# Patient Record
Sex: Female | Born: 1935 | Race: Black or African American | Hispanic: No | Smoking: Former smoker
Health system: Southern US, Community
[De-identification: ages and names within clinical notes are randomized; demographics above are authoritative.]

## PROBLEM LIST (undated history)

## (undated) DIAGNOSIS — I519 Heart disease, unspecified: Secondary | ICD-10-CM

## (undated) DIAGNOSIS — I10 Essential (primary) hypertension: Secondary | ICD-10-CM

## (undated) DIAGNOSIS — K219 Gastro-esophageal reflux disease without esophagitis: Secondary | ICD-10-CM

## (undated) DIAGNOSIS — F0390 Unspecified dementia without behavioral disturbance: Secondary | ICD-10-CM

## (undated) HISTORY — DX: Heart disease, unspecified: I51.9

## (undated) HISTORY — PX: BREAST LUMPECTOMY: SHX2

---

## 2001-04-22 ENCOUNTER — Encounter: Payer: Self-pay | Admitting: *Deleted

## 2001-04-22 ENCOUNTER — Emergency Department (HOSPITAL_COMMUNITY): Admission: EM | Admit: 2001-04-22 | Discharge: 2001-04-22 | Payer: Self-pay | Admitting: *Deleted

## 2005-04-08 ENCOUNTER — Emergency Department (HOSPITAL_COMMUNITY): Admission: EM | Admit: 2005-04-08 | Discharge: 2005-04-08 | Payer: Self-pay | Admitting: Emergency Medicine

## 2005-05-02 ENCOUNTER — Encounter: Admission: RE | Admit: 2005-05-02 | Discharge: 2005-05-02 | Payer: Self-pay | Admitting: Neurology

## 2005-05-19 ENCOUNTER — Emergency Department (HOSPITAL_COMMUNITY): Admission: EM | Admit: 2005-05-19 | Discharge: 2005-05-19 | Payer: Self-pay | Admitting: Family Medicine

## 2005-07-17 ENCOUNTER — Ambulatory Visit (HOSPITAL_COMMUNITY): Admission: RE | Admit: 2005-07-17 | Discharge: 2005-07-17 | Payer: Self-pay | Admitting: Neurology

## 2008-11-16 ENCOUNTER — Inpatient Hospital Stay (HOSPITAL_COMMUNITY): Admission: RE | Admit: 2008-11-16 | Discharge: 2008-11-19 | Payer: Self-pay | Admitting: Orthopaedic Surgery

## 2008-11-17 ENCOUNTER — Ambulatory Visit: Payer: Self-pay | Admitting: Physical Medicine & Rehabilitation

## 2008-11-18 ENCOUNTER — Encounter (INDEPENDENT_AMBULATORY_CARE_PROVIDER_SITE_OTHER): Payer: Self-pay | Admitting: Orthopaedic Surgery

## 2008-11-18 ENCOUNTER — Ambulatory Visit: Payer: Self-pay | Admitting: Surgery

## 2008-11-19 ENCOUNTER — Inpatient Hospital Stay (HOSPITAL_COMMUNITY)
Admission: RE | Admit: 2008-11-19 | Discharge: 2008-11-27 | Payer: Self-pay | Admitting: Physical Medicine & Rehabilitation

## 2008-11-19 ENCOUNTER — Ambulatory Visit: Payer: Self-pay | Admitting: Physical Medicine & Rehabilitation

## 2008-11-26 ENCOUNTER — Encounter: Payer: Self-pay | Admitting: Physical Medicine & Rehabilitation

## 2008-12-03 ENCOUNTER — Encounter: Admission: RE | Admit: 2008-12-03 | Discharge: 2008-12-03 | Payer: Self-pay | Admitting: Orthopaedic Surgery

## 2009-09-30 ENCOUNTER — Encounter: Admission: RE | Admit: 2009-09-30 | Discharge: 2009-09-30 | Payer: Self-pay | Admitting: Orthopaedic Surgery

## 2010-08-26 IMAGING — CR DG CHEST 2V
2 series · 2 of 2 positions shown · non-contrast
Comparison: None

CLINICAL DATA: Preoperative evaluation for right total knee
arthroplasty.  Nonsmoker.  Hypertension.  No current chest
complaints.

CHEST - 2 VIEW

[view not recorded (1 of 2)]
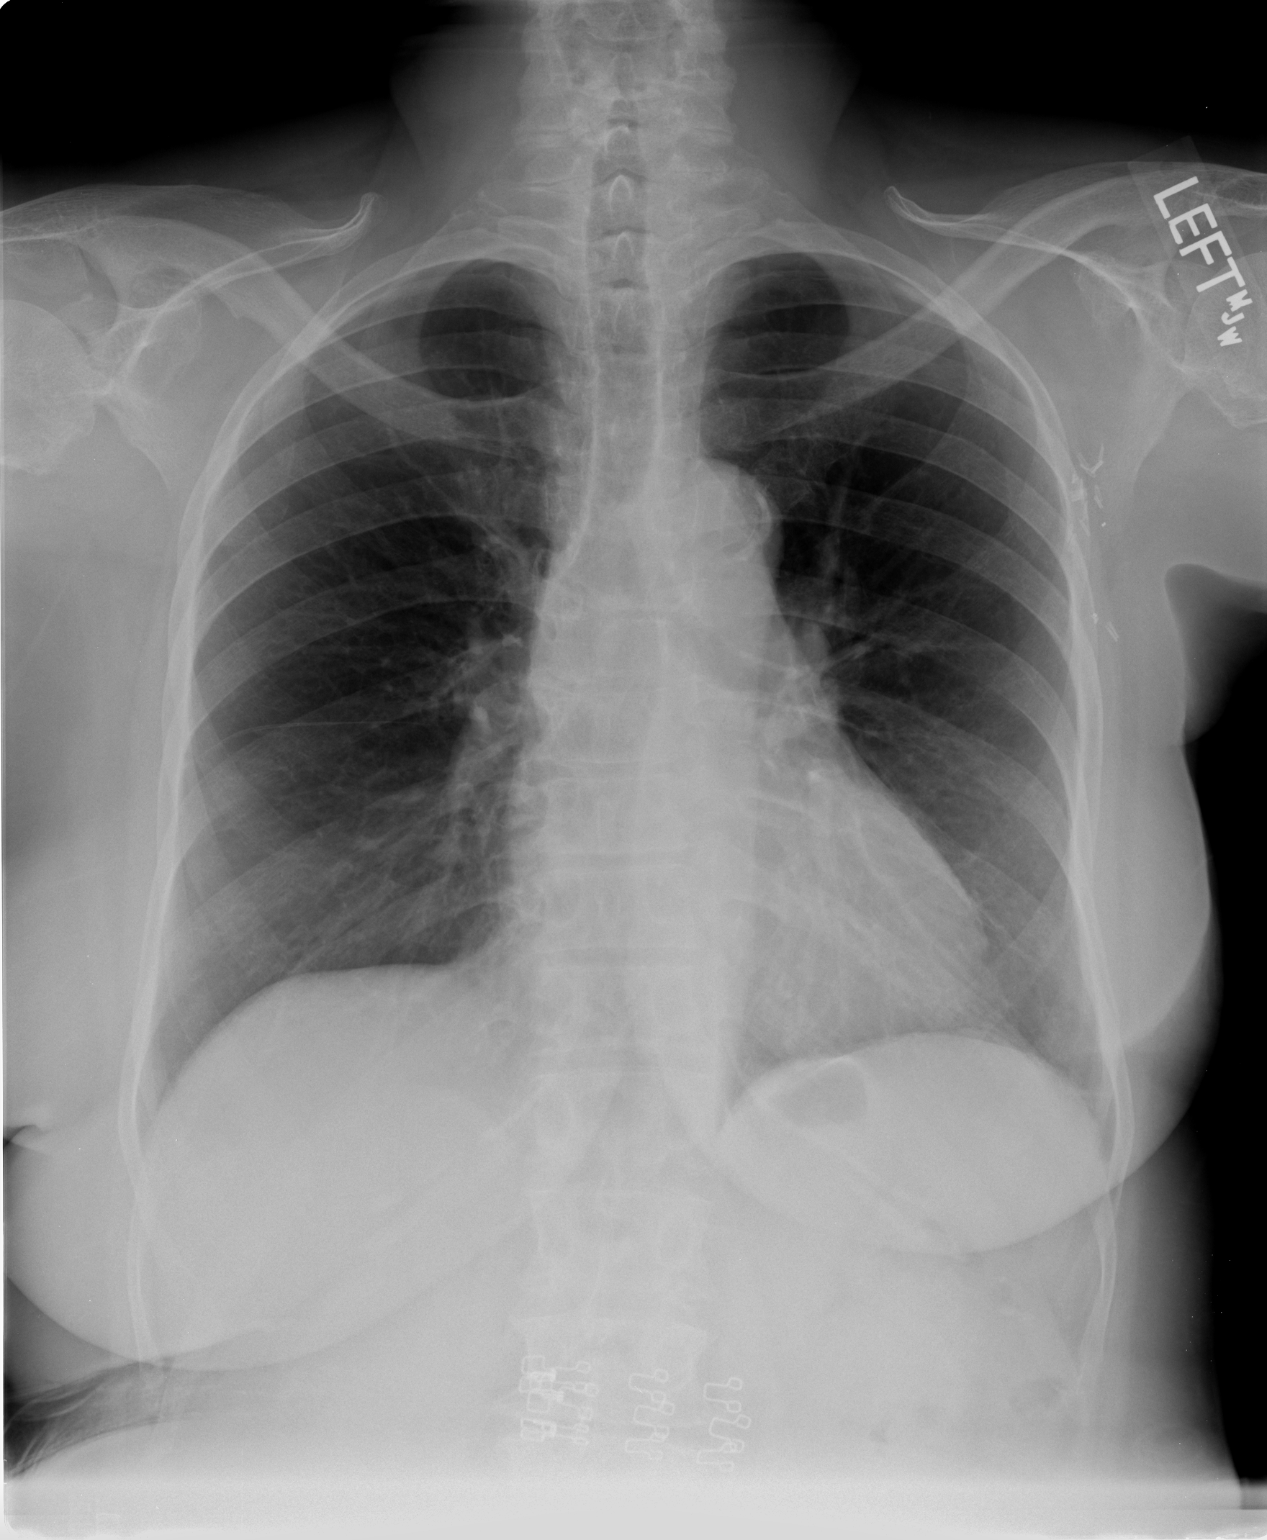

[view not recorded (2 of 2)]
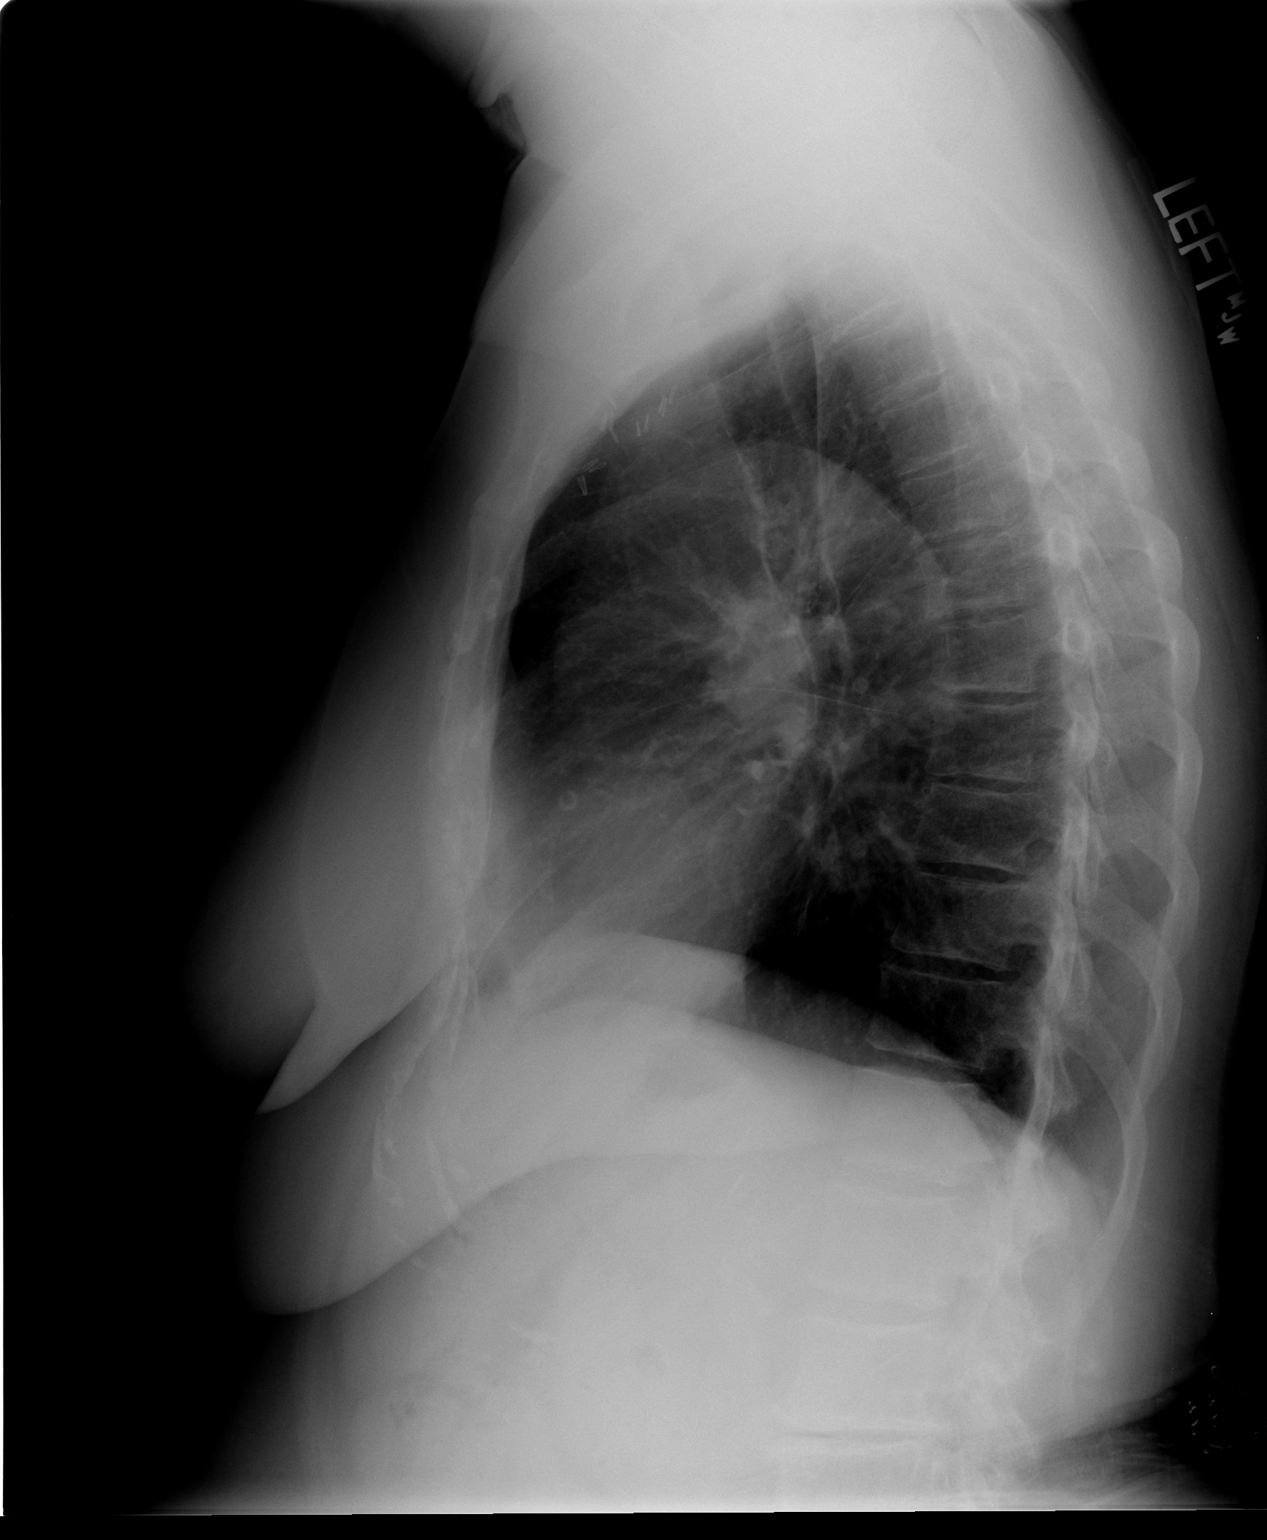

[2 of 2 positions shown; findings below may reference images not displayed]

FINDINGS: Heart and mediastinal contours are within normal limits.
There is calcification identified in the descending portion of the
thoracic aorta.  The lung fields appear clear with no focal
infiltrates or signs of congestive failure.  Surgical clips are
identified in the left axillary region and there is some
irregularity of the left breast contour raising the possibility of
prior lumpectomy with associated node dissection.  Clinical
correlation is recommended.

Bony structures demonstrate some degenerative disc space narrowing
of the upper lumbar spine and are otherwise intact.
IMPRESSION: No acute cardiopulmonary disease noted.

## 2010-11-07 LAB — PROTIME-INR
INR: 1.7 — ABNORMAL HIGH (ref 0.00–1.49)
Prothrombin Time: 21.2 seconds — ABNORMAL HIGH (ref 11.6–15.2)

## 2010-11-07 LAB — GLUCOSE, CAPILLARY
Glucose-Capillary: 122 mg/dL — ABNORMAL HIGH (ref 70–99)
Glucose-Capillary: 89 mg/dL (ref 70–99)

## 2010-11-08 LAB — COMPREHENSIVE METABOLIC PANEL
ALT: 12 U/L (ref 0–35)
ALT: 13 U/L (ref 0–35)
ALT: 23 U/L (ref 0–35)
AST: 17 U/L (ref 0–37)
AST: 34 U/L (ref 0–37)
AST: 30 U/L (ref 0–37)
Albumin: 2.4 g/dL — ABNORMAL LOW (ref 3.5–5.2)
Albumin: 2.6 g/dL — ABNORMAL LOW (ref 3.5–5.2)
Albumin: 3.6 g/dL (ref 3.5–5.2)
Alkaline Phosphatase: 105 U/L (ref 39–117)
Alkaline Phosphatase: 145 U/L — ABNORMAL HIGH (ref 39–117)
Alkaline Phosphatase: 218 U/L — ABNORMAL HIGH (ref 39–117)
BUN: 10 mg/dL (ref 6–23)
BUN: 8 mg/dL (ref 6–23)
BUN: 16 mg/dL (ref 6–23)
CO2: 27 mEq/L (ref 19–32)
CO2: 29 mEq/L (ref 19–32)
CO2: 31 mEq/L (ref 19–32)
Calcium: 8.6 mg/dL (ref 8.4–10.5)
Calcium: 8.7 mg/dL (ref 8.4–10.5)
Calcium: 10 mg/dL (ref 8.4–10.5)
Chloride: 102 mEq/L (ref 96–112)
Chloride: 95 mEq/L — ABNORMAL LOW (ref 96–112)
Chloride: 98 mEq/L (ref 96–112)
Creatinine, Ser: 0.79 mg/dL (ref 0.4–1.2)
Creatinine, Ser: 1.09 mg/dL (ref 0.4–1.2)
Creatinine, Ser: 1.07 mg/dL (ref 0.4–1.2)
GFR calc Af Amer: 59 mL/min — ABNORMAL LOW (ref >60)
GFR calc Af Amer: >60 mL/min (ref >60)
GFR calc Af Amer: >60 mL/min (ref >60)
GFR calc non Af Amer: 49 mL/min — ABNORMAL LOW (ref >60)
GFR calc non Af Amer: >60 mL/min (ref >60)
GFR calc non Af Amer: 50 mL/min — ABNORMAL LOW (ref >60)
Glucose, Bld: 131 mg/dL — ABNORMAL HIGH (ref 70–99)
Glucose, Bld: 168 mg/dL — ABNORMAL HIGH (ref 70–99)
Glucose, Bld: 142 mg/dL — ABNORMAL HIGH (ref 70–99)
Potassium: 3.6 mEq/L (ref 3.5–5.1)
Potassium: 4.2 mEq/L (ref 3.5–5.1)
Potassium: 4.3 mEq/L (ref 3.5–5.1)
Sodium: 130 mEq/L — ABNORMAL LOW (ref 135–145)
Sodium: 137 mEq/L (ref 135–145)
Sodium: 138 mEq/L (ref 135–145)
Total Bilirubin: 0.6 mg/dL (ref 0.3–1.2)
Total Bilirubin: 0.8 mg/dL (ref 0.3–1.2)
Total Bilirubin: 0.7 mg/dL (ref 0.3–1.2)
Total Protein: 6 g/dL (ref 6.0–8.3)
Total Protein: 6.2 g/dL (ref 6.0–8.3)
Total Protein: 7.2 g/dL (ref 6.0–8.3)

## 2010-11-08 LAB — GLUCOSE, CAPILLARY
Glucose-Capillary: 100 mg/dL — ABNORMAL HIGH (ref 70–99)
Glucose-Capillary: 101 mg/dL — ABNORMAL HIGH (ref 70–99)
Glucose-Capillary: 114 mg/dL — ABNORMAL HIGH (ref 70–99)
Glucose-Capillary: 117 mg/dL — ABNORMAL HIGH (ref 70–99)
Glucose-Capillary: 117 mg/dL — ABNORMAL HIGH (ref 70–99)
Glucose-Capillary: 119 mg/dL — ABNORMAL HIGH (ref 70–99)
Glucose-Capillary: 120 mg/dL — ABNORMAL HIGH (ref 70–99)
Glucose-Capillary: 123 mg/dL — ABNORMAL HIGH (ref 70–99)
Glucose-Capillary: 123 mg/dL — ABNORMAL HIGH (ref 70–99)
Glucose-Capillary: 125 mg/dL — ABNORMAL HIGH (ref 70–99)
Glucose-Capillary: 125 mg/dL — ABNORMAL HIGH (ref 70–99)
Glucose-Capillary: 128 mg/dL — ABNORMAL HIGH (ref 70–99)
Glucose-Capillary: 129 mg/dL — ABNORMAL HIGH (ref 70–99)
Glucose-Capillary: 129 mg/dL — ABNORMAL HIGH (ref 70–99)
Glucose-Capillary: 131 mg/dL — ABNORMAL HIGH (ref 70–99)
Glucose-Capillary: 133 mg/dL — ABNORMAL HIGH (ref 70–99)
Glucose-Capillary: 133 mg/dL — ABNORMAL HIGH (ref 70–99)
Glucose-Capillary: 134 mg/dL — ABNORMAL HIGH (ref 70–99)
Glucose-Capillary: 136 mg/dL — ABNORMAL HIGH (ref 70–99)
Glucose-Capillary: 136 mg/dL — ABNORMAL HIGH (ref 70–99)
Glucose-Capillary: 139 mg/dL — ABNORMAL HIGH (ref 70–99)
Glucose-Capillary: 139 mg/dL — ABNORMAL HIGH (ref 70–99)
Glucose-Capillary: 140 mg/dL — ABNORMAL HIGH (ref 70–99)
Glucose-Capillary: 141 mg/dL — ABNORMAL HIGH (ref 70–99)
Glucose-Capillary: 144 mg/dL — ABNORMAL HIGH (ref 70–99)
Glucose-Capillary: 145 mg/dL — ABNORMAL HIGH (ref 70–99)
Glucose-Capillary: 149 mg/dL — ABNORMAL HIGH (ref 70–99)
Glucose-Capillary: 150 mg/dL — ABNORMAL HIGH (ref 70–99)
Glucose-Capillary: 154 mg/dL — ABNORMAL HIGH (ref 70–99)
Glucose-Capillary: 155 mg/dL — ABNORMAL HIGH (ref 70–99)
Glucose-Capillary: 157 mg/dL — ABNORMAL HIGH (ref 70–99)
Glucose-Capillary: 158 mg/dL — ABNORMAL HIGH (ref 70–99)
Glucose-Capillary: 158 mg/dL — ABNORMAL HIGH (ref 70–99)
Glucose-Capillary: 164 mg/dL — ABNORMAL HIGH (ref 70–99)
Glucose-Capillary: 167 mg/dL — ABNORMAL HIGH (ref 70–99)
Glucose-Capillary: 167 mg/dL — ABNORMAL HIGH (ref 70–99)
Glucose-Capillary: 179 mg/dL — ABNORMAL HIGH (ref 70–99)
Glucose-Capillary: 187 mg/dL — ABNORMAL HIGH (ref 70–99)
Glucose-Capillary: 75 mg/dL (ref 70–99)
Glucose-Capillary: 84 mg/dL (ref 70–99)
Glucose-Capillary: 86 mg/dL (ref 70–99)
Glucose-Capillary: 94 mg/dL (ref 70–99)
Glucose-Capillary: 95 mg/dL (ref 70–99)
Glucose-Capillary: 95 mg/dL (ref 70–99)

## 2010-11-08 LAB — CBC
HCT: 25.9 % — ABNORMAL LOW (ref 36.0–46.0)
HCT: 26.2 % — ABNORMAL LOW (ref 36.0–46.0)
HCT: 26.9 % — ABNORMAL LOW (ref 36.0–46.0)
HCT: 28.1 % — ABNORMAL LOW (ref 36.0–46.0)
HCT: 28.4 % — ABNORMAL LOW (ref 36.0–46.0)
HCT: 32.2 % — ABNORMAL LOW (ref 36.0–46.0)
HCT: 39.4 % (ref 36.0–46.0)
Hemoglobin: 10.8 g/dL — ABNORMAL LOW (ref 12.0–15.0)
Hemoglobin: 8.9 g/dL — ABNORMAL LOW (ref 12.0–15.0)
Hemoglobin: 9.1 g/dL — ABNORMAL LOW (ref 12.0–15.0)
Hemoglobin: 9.2 g/dL — ABNORMAL LOW (ref 12.0–15.0)
Hemoglobin: 9.5 g/dL — ABNORMAL LOW (ref 12.0–15.0)
Hemoglobin: 9.5 g/dL — ABNORMAL LOW (ref 12.0–15.0)
Hemoglobin: 13.7 g/dL (ref 12.0–15.0)
MCHC: 33.4 g/dL (ref 30.0–36.0)
MCHC: 33.6 g/dL (ref 30.0–36.0)
MCHC: 33.8 g/dL (ref 30.0–36.0)
MCHC: 34 g/dL (ref 30.0–36.0)
MCHC: 34.2 g/dL (ref 30.0–36.0)
MCHC: 35.1 g/dL (ref 30.0–36.0)
MCHC: 34.9 g/dL (ref 30.0–36.0)
MCV: 87.1 fL (ref 78.0–100.0)
MCV: 88.6 fL (ref 78.0–100.0)
MCV: 88.6 fL (ref 78.0–100.0)
MCV: 88.7 fL (ref 78.0–100.0)
MCV: 88.9 fL (ref 78.0–100.0)
MCV: 89 fL (ref 78.0–100.0)
MCV: 87.3 fL (ref 78.0–100.0)
Platelets: 228 10*3/uL (ref 150–400)
Platelets: 241 10*3/uL (ref 150–400)
Platelets: 270 10*3/uL (ref 150–400)
Platelets: 324 10*3/uL (ref 150–400)
Platelets: 351 10*3/uL (ref 150–400)
Platelets: 376 10*3/uL (ref 150–400)
Platelets: 258 10*3/uL (ref 150–400)
RBC: 2.96 MIL/uL — ABNORMAL LOW (ref 3.87–5.11)
RBC: 2.97 MIL/uL — ABNORMAL LOW (ref 3.87–5.11)
RBC: 3.02 MIL/uL — ABNORMAL LOW (ref 3.87–5.11)
RBC: 3.16 MIL/uL — ABNORMAL LOW (ref 3.87–5.11)
RBC: 3.2 MIL/uL — ABNORMAL LOW (ref 3.87–5.11)
RBC: 3.63 MIL/uL — ABNORMAL LOW (ref 3.87–5.11)
RBC: 4.51 MIL/uL (ref 3.87–5.11)
RDW: 14.2 % (ref 11.5–15.5)
RDW: 14.3 % (ref 11.5–15.5)
RDW: 14.5 % (ref 11.5–15.5)
RDW: 14.8 % (ref 11.5–15.5)
RDW: 14.8 % (ref 11.5–15.5)
RDW: 14.9 % (ref 11.5–15.5)
RDW: 14.8 % (ref 11.5–15.5)
WBC: 10.1 10*3/uL (ref 4.0–10.5)
WBC: 11.7 10*3/uL — ABNORMAL HIGH (ref 4.0–10.5)
WBC: 12.2 10*3/uL — ABNORMAL HIGH (ref 4.0–10.5)
WBC: 8.1 10*3/uL (ref 4.0–10.5)
WBC: 8.4 10*3/uL (ref 4.0–10.5)
WBC: 8.5 10*3/uL (ref 4.0–10.5)
WBC: 6.3 10*3/uL (ref 4.0–10.5)

## 2010-11-08 LAB — BASIC METABOLIC PANEL
BUN: 10 mg/dL (ref 6–23)
BUN: 13 mg/dL (ref 6–23)
BUN: 17 mg/dL (ref 6–23)
BUN: 8 mg/dL (ref 6–23)
CO2: 26 mEq/L (ref 19–32)
CO2: 28 mEq/L (ref 19–32)
CO2: 28 mEq/L (ref 19–32)
CO2: 29 mEq/L (ref 19–32)
Calcium: 8.6 mg/dL (ref 8.4–10.5)
Calcium: 8.7 mg/dL (ref 8.4–10.5)
Calcium: 8.9 mg/dL (ref 8.4–10.5)
Calcium: 8.9 mg/dL (ref 8.4–10.5)
Chloride: 100 mEq/L (ref 96–112)
Chloride: 96 mEq/L (ref 96–112)
Chloride: 97 mEq/L (ref 96–112)
Chloride: 99 mEq/L (ref 96–112)
Creatinine, Ser: 0.83 mg/dL (ref 0.4–1.2)
Creatinine, Ser: 0.98 mg/dL (ref 0.4–1.2)
Creatinine, Ser: 1.77 mg/dL — ABNORMAL HIGH (ref 0.4–1.2)
Creatinine, Ser: 1.98 mg/dL — ABNORMAL HIGH (ref 0.4–1.2)
GFR calc Af Amer: 30 mL/min — ABNORMAL LOW (ref >60)
GFR calc Af Amer: 34 mL/min — ABNORMAL LOW (ref >60)
GFR calc Af Amer: >60 mL/min (ref >60)
GFR calc Af Amer: >60 mL/min (ref >60)
GFR calc non Af Amer: 24 mL/min — ABNORMAL LOW (ref >60)
GFR calc non Af Amer: 28 mL/min — ABNORMAL LOW (ref >60)
GFR calc non Af Amer: 55 mL/min — ABNORMAL LOW (ref >60)
GFR calc non Af Amer: >60 mL/min (ref >60)
Glucose, Bld: 117 mg/dL — ABNORMAL HIGH (ref 70–99)
Glucose, Bld: 151 mg/dL — ABNORMAL HIGH (ref 70–99)
Glucose, Bld: 152 mg/dL — ABNORMAL HIGH (ref 70–99)
Glucose, Bld: 193 mg/dL — ABNORMAL HIGH (ref 70–99)
Potassium: 3.7 mEq/L (ref 3.5–5.1)
Potassium: 3.9 mEq/L (ref 3.5–5.1)
Potassium: 4.1 mEq/L (ref 3.5–5.1)
Potassium: 4.8 mEq/L (ref 3.5–5.1)
Sodium: 133 mEq/L — ABNORMAL LOW (ref 135–145)
Sodium: 134 mEq/L — ABNORMAL LOW (ref 135–145)
Sodium: 134 mEq/L — ABNORMAL LOW (ref 135–145)
Sodium: 134 mEq/L — ABNORMAL LOW (ref 135–145)

## 2010-11-08 LAB — URINALYSIS, ROUTINE W REFLEX MICROSCOPIC
Bilirubin Urine: NEGATIVE
Bilirubin Urine: NEGATIVE
Bilirubin Urine: NEGATIVE
Glucose, UA: NEGATIVE mg/dL
Glucose, UA: NEGATIVE mg/dL
Glucose, UA: NEGATIVE mg/dL
Hgb urine dipstick: NEGATIVE
Hgb urine dipstick: NEGATIVE
Ketones, ur: 15 mg/dL — AB
Ketones, ur: NEGATIVE mg/dL
Ketones, ur: 15 mg/dL — AB
Nitrite: NEGATIVE
Nitrite: NEGATIVE
Nitrite: POSITIVE — AB
Protein, ur: NEGATIVE mg/dL
Protein, ur: NEGATIVE mg/dL
Protein, ur: 30 mg/dL — AB
Specific Gravity, Urine: 1.005 (ref 1.005–1.030)
Specific Gravity, Urine: 1.023 (ref 1.005–1.030)
Specific Gravity, Urine: 1.026 (ref 1.005–1.030)
Urobilinogen, UA: 0.2 mg/dL (ref 0.0–1.0)
Urobilinogen, UA: 0.2 mg/dL (ref 0.0–1.0)
Urobilinogen, UA: 0.2 mg/dL (ref 0.0–1.0)
pH: 6 (ref 5.0–8.0)
pH: 6 (ref 5.0–8.0)
pH: 5.5 (ref 5.0–8.0)

## 2010-11-08 LAB — CROSSMATCH
ABO/RH(D): O POS
Antibody Screen: NEGATIVE

## 2010-11-08 LAB — URINE CULTURE
Colony Count: NO GROWTH
Colony Count: >=100000
Culture: NO GROWTH
Special Requests: NEGATIVE

## 2010-11-08 LAB — DIFFERENTIAL
Basophils Absolute: 0 10*3/uL (ref 0.0–0.1)
Basophils Absolute: 0 10*3/uL (ref 0.0–0.1)
Basophils Relative: 0 % (ref 0–1)
Basophils Relative: 0 % (ref 0–1)
Eosinophils Absolute: 0.1 10*3/uL (ref 0.0–0.7)
Eosinophils Absolute: 0.2 10*3/uL (ref 0.0–0.7)
Eosinophils Relative: 2 % (ref 0–5)
Eosinophils Relative: 3 % (ref 0–5)
Lymphocytes Relative: 12 % (ref 12–46)
Lymphocytes Relative: 22 % (ref 12–46)
Lymphs Abs: 1 10*3/uL (ref 0.7–4.0)
Lymphs Abs: 1.4 10*3/uL (ref 0.7–4.0)
Monocytes Absolute: 0.9 10*3/uL (ref 0.1–1.0)
Monocytes Absolute: 0.5 10*3/uL (ref 0.1–1.0)
Monocytes Relative: 10 % (ref 3–12)
Monocytes Relative: 8 % (ref 3–12)
Neutro Abs: 6.5 10*3/uL (ref 1.7–7.7)
Neutro Abs: 4.2 10*3/uL (ref 1.7–7.7)
Neutrophils Relative %: 77 % (ref 43–77)
Neutrophils Relative %: 67 % (ref 43–77)

## 2010-11-08 LAB — PROTIME-INR
INR: 1.1 (ref 0.00–1.49)
INR: 1.9 — ABNORMAL HIGH (ref 0.00–1.49)
INR: 2.1 — ABNORMAL HIGH (ref 0.00–1.49)
INR: 2.2 — ABNORMAL HIGH (ref 0.00–1.49)
INR: 2.5 — ABNORMAL HIGH (ref 0.00–1.49)
INR: 2.6 — ABNORMAL HIGH (ref 0.00–1.49)
INR: 2.7 — ABNORMAL HIGH (ref 0.00–1.49)
INR: 3 — ABNORMAL HIGH (ref 0.00–1.49)
INR: 3.1 — ABNORMAL HIGH (ref 0.00–1.49)
INR: 0.9 (ref 0.00–1.49)
Prothrombin Time: 14.8 seconds (ref 11.6–15.2)
Prothrombin Time: 22.6 seconds — ABNORMAL HIGH (ref 11.6–15.2)
Prothrombin Time: 24.8 seconds — ABNORMAL HIGH (ref 11.6–15.2)
Prothrombin Time: 26 seconds — ABNORMAL HIGH (ref 11.6–15.2)
Prothrombin Time: 29 seconds — ABNORMAL HIGH (ref 11.6–15.2)
Prothrombin Time: 29.8 seconds — ABNORMAL HIGH (ref 11.6–15.2)
Prothrombin Time: 30.6 seconds — ABNORMAL HIGH (ref 11.6–15.2)
Prothrombin Time: 33.7 seconds — ABNORMAL HIGH (ref 11.6–15.2)
Prothrombin Time: 34.2 seconds — ABNORMAL HIGH (ref 11.6–15.2)
Prothrombin Time: 12.7 seconds (ref 11.6–15.2)

## 2010-11-08 LAB — SODIUM, URINE, RANDOM: Sodium, Ur: 22 mEq/L

## 2010-11-08 LAB — URINE MICROSCOPIC-ADD ON

## 2010-11-08 LAB — TYPE AND SCREEN
ABO/RH(D): O POS
Antibody Screen: NEGATIVE

## 2010-11-08 LAB — FOLATE: Folate: 8.1 ng/mL

## 2010-11-08 LAB — VITAMIN B12: Vitamin B-12: 506 pg/mL (ref 211–911)

## 2010-11-08 LAB — OSMOLALITY: Osmolality: 274 mOsm/kg — ABNORMAL LOW (ref 275–300)

## 2010-11-08 LAB — URIC ACID: Uric Acid, Serum: 3.2 mg/dL (ref 2.4–7.0)

## 2010-11-08 LAB — APTT: aPTT: 35 seconds (ref 24–37)

## 2010-11-08 LAB — TSH: TSH: 0.381 u[IU]/mL (ref 0.350–4.500)

## 2010-11-08 LAB — HEMOGLOBIN A1C
Hgb A1c MFr Bld: 6.5 % — ABNORMAL HIGH (ref 4.6–6.1)
Mean Plasma Glucose: 140 mg/dL

## 2010-11-08 LAB — ABO/RH: ABO/RH(D): O POS

## 2010-11-08 LAB — RPR: RPR Ser Ql: NONREACTIVE

## 2010-11-08 LAB — ALDOSTERONE: Aldosterone, Serum: 1 ng/dL

## 2010-12-12 NOTE — Op Note (Signed)
[name], [name]                    ACCOUNT NO.:  [id number]   MEDICAL RECORD NO.:  [id number]          PATIENT TYPE:  INP   LOCATION:  NA                           FACILITY:  [facility]   PHYSICIAN:  Vonna Kotyk. Whitfield, M.D.DATE OF BIRTH:  [birth year]   DATE OF PROCEDURE:  11/16/2008  DATE OF DISCHARGE:                               OPERATIVE REPORT   PREOPERATIVE DIAGNOSIS:  End-stage osteoarthritis, right knee.   POSTOPERATIVE DIAGNOSIS:  End-stage osteoarthritis, right knee.   PROCEDURE:  Right total knee replacement.   SURGEON:  Vonna Kotyk. Durward Fortes, MD   ASSISTANT:  Mike Craze. Petrarca, PA-C   ANESTHESIA:  General with supplemental femoral nerve block.   COMPLICATIONS:  None.   COMPONENTS:  DePuy LCS standard femoral component, a #3 keeled tibial  rotating tray with 10 mm bridging bearing, a 3 peg metal back rotating  patella.  All metallic components were secured with polymethyl  methacrylate.   PROCEDURE IN DETAIL:  With the patient comfortable on the operating  table, she was placed under general orotracheal anesthesia.  She did  receive a preoperative femoral nerve block.  Nursing staff inserted a  Foley catheter.  Urine was clear.   Tourniquet was then applied to the right lower extremity.  The right leg  was then prepped with Betadine scrub and then DuraPrep with the  tourniquet to the midfoot.  Sterile draping was performed.  With the  extremity still elevated, it was Esmarch exsanguinated with a proximal  tourniquet at 350 mmHg.   A midline longitudinal incision was made, centered about the patella,  extending from the superior pouch to the tibial tubercle via sharp  dissection.  Incision was carried down to subcutaneous tissue.  The  first layer of capsule was incised in the midline.  A medial  parapatellar incision was then made with a Bovie through the deep  capsule.  The joint was entered with clear yellow joint effusion.   The patella was everted to 180 degrees.   The knee flexed to 90 degrees.  There were moderate-sized osteophytes particularly on the medial  compartment, smaller osteophytes laterally and about the patella.  There  was complete absence of articular cartilage on the medial femoral  condyle.  There was mild varus position that was easily correctable to  neutral.  There was also very mild reddened synovitis.  A synovectomy  was performed.   We confirmed a standard femoral component and a #3 tibial tray.   First bony cut was made transversely on the proximal tibia with a 7  degree posterior declination.  At every stage, we checked with the  alignment guide and felt we are in perfect position.  The alignment  guide was then applied and the osteotomy made transversely across the  proximal tibia.   Subsequent cut was then made along the anterior and posterior femur.  We  used a 4 degree distal femoral valgus cut.  The 10 mm flexion/extension  gaps were symmetrical and again at each stage, we checked the alignment  guide.  The final cut was then  made on the tibia to taper the femur and  to insert the two center holes in the femoral condyles.  There were no  osteophytes behind the femoral condyles.  Lamina spreader was inserted  to both medial and lateral compartments.  Medial and lateral menisci,  ACL, and PCL were excised.  There was a Baker cyst that was partially  excised along medially and posteriorly.   Retractor was then placed around the tibia.  We trialed a #3 tibial  tray.  This was then applied with 2 pins.  The center hole was then  made.  The keeled cut was then applied.  The retention pins were then  removed.  A 10 mm bridging bearing was inserted followed by the trial  femoral component.  We had full extension.  No opening with varus or  valgus stress and had very nice flexion without malrotation of any of  the components.   Patella was then prepared by removing 10 mm of bone, leaving 13 mm of  patella thickness.   Three holes were then made, the trial patella  applied, and through a full range of motion it was perfectly stable.   Trial components were removed.  The joint was copiously irrigated with  jet saline.  The final metallic components were then inserted with  polymethyl methacrylate.  Extraneous methacrylate was removed from their  periphery.  The 10-mm bridging bearing again gave [place] perfect stability.   The knee was held in extension and any further extraneous methacrylate  was removed with a Valora Corporal.  After complete maturation of methacrylate,  the joint was then __________ of loose methacrylate.  Joint was again  irrigated with saline solution.  We then injected Marcaine with  epinephrine into the deep capsule.  Tourniquet was deflated.  There was  immediate capillary refill to the cut surfaces.  We did apply FloSeal.  We had a very nice dry feel.  A Hemovac was not necessary.  We lightly  irrigated the joint.   The deep capsule was closed with interrupted #1 Ethibond.  Superficial  capsule was closed with running 0 Vicryl, subcu with 2-0 Vicryl, skin  closed with skin clips.  Sterile bulky dressing was applied followed by  the patient's support stocking.   The patient tolerated the procedure well without complications.      Vonna Kotyk. Durward Fortes, M.D.  Electronically Signed     PWW/MEDQ  D:  11/16/2008  T:  11/17/2008  Job:  [id number]

## 2010-12-12 NOTE — H&P (Signed)
NAMEARNOLA, [name] NO.:  [id number]   MEDICAL RECORD NO.:  [id number]          PATIENT TYPE:  IPS   LOCATION:  [id number]                         FACILITY:  [facility]   PHYSICIAN:  Meredith Staggers, M.D.DATE OF BIRTH:  1929-07-16   DATE OF ADMISSION:  11/19/2008  DATE OF DISCHARGE:                              HISTORY & PHYSICAL   PRIMARY CARE PHYSICIAN:  Keane Police, MD, [place], [place].   CARDIOLOGIST:  Cleora Fleet, MD, [place], [place].   ORTHOPEDIST:  Vonna Kotyk. Durward Fortes, MD   CHIEF COMPLAINT:  Right knee pain.   HISTORY OF PRESENT ILLNESS:  This is a 77 year old African American  female with breast cancer, diabetes, and seizure disorder who was  admitted on November 16, 2008 with end-stage changes in the right knee.  She failed conservative measures and underwent right total knee  replacement on November 16, 2008 by Dr. Durward Fortes.  She is 50%  weightbearing and on Coumadin for DVT prophylaxis.  Postoperatively, she  had increased of her creatinine to 1.98.  [facility] was consulted and  medications were held and the patient was placed on gentle IV fluids.  Creatinine has since improved to 0.83 as of today.  Sodium is decreased  to 130 and the patient was placed on fluids today.  The patient has had  some mild confusion which has since improved after adjusting narcotics.  The patient is still needing some cuing for maintenance of weightbearing  precautions.  She is placed on Cipro on November 17, 2008 for Klebsiella  UTI x5 days.  Sugars have been monitored as well and Lantus insulin was  added along with Starlix.  PMNR was consulted yesterday and after review  felt the patient could benefit from an inpatient rehab admission, thus  she was brought today.   REVIEW OF SYSTEMS:  Notable for constipation, joint swelling, history of  remote seizures, and reflux.  Appetite has been improving.  Other  pertinent positives are as above and full review is in the  written  history and physical.   PAST MEDICAL HISTORY:  1. Hypertension.  2. Diabetes.  3. Gout.  4. Asthma.  5. Seizure disorder.  6. Left breast cancer status post chemo.  7. GERD.  8. Cholecystectomy.  9. Hysterectomy.  10.History is negative for alcohol or tobacco.   FAMILY HISTORY:  Positive for CAD and diabetes.   SOCIAL HISTORY:  The patient lives alone in [place], [place].  She  plans to stay with the daughter in [place] on discharge.  Daughter  works for plans to adjust schedule as needed to assist at home.  She has  a three-level home with bedroom upstairs.   ALLERGIES:  ACE INHIBITORS and ULTRAM.   MEDICATIONS:  Metoprolol, Diovan, aspirin, KCl, Starlix, Aciphex,  allopurinol, Os-Cal, Singulair, albuterol, Norvasc, Keppra, and Flonase.   LABORATORY DATA:  Hemoglobin 9.1, platelets 241, and white count 10.  Sodium 133, potassium 3.9, BUN 8, and creatinine 0.8.  INR 2.7.  Uric  acid 3.2.   PHYSICAL EXAMINATION:  VITAL SIGNS:  Blood pressure is 146/74, pulse is  83, respiratory rate 18, and temperature 98.2.  GENERAL:  The patient is pleasant, alert, and oriented x3.  Pupils  equal, round, and reactive to light.  EARS, NOSE, AND THROAT:  Generally unremarkable with fair dentition and  pink moist mucosa.  NECK:  Supple without JVD or lymphadenopathy.  CHEST:  Clear to auscultation bilaterally without wheezes, rales, or  rhonchi.  HEART:  Regular rate and rhythm without murmurs, rubs, or gallops.  EXTREMITIES:  No clubbing, cyanosis, or edema.  ABDOMEN:  Soft and nontender.  Bowel sounds are positive.  SKIN:  Notable for right knee incision which was covered with Mepilex  and clean, dry, and intact.  There is a mild hematoma midway down the  incision.  The area is appropriately tender.  NEUROLOGIC:  Cranial nerves II-XII are intact.  Reflexes are 1+ in lower  and 2+ in upper extremities.  Sensation is grossly intact throughout.  Judgment, orientation,  memory, and mood are all appropriate.   POST ADMISSION PHYSICIAN EVALUATION:  1. Functional deficit secondary to OA of the right knee status post      right total knee replacement postoperative day #3.  2. The patient is admitted to receive collaborative and      interdisciplinary care between the physiatrist, rehab nursing      staff, and therapy team.  3. The patient's level of medical complexity and substantial therapy      needs in context of that medical necessity cannot be provided at a      lesser intensity of care.  4. The patient has experienced substantial functional loss from her      baseline.  Premorbidly, she is independent with a cane but not      driving.  Currently within the last 24 hours, the patient is total      assist with gait 2-3 feet with difficulty observing weightbearing.      She is mod assist sit to stand transfers (70%).  She is total      assist to stand to sit, transfers 50% assistance from bed mobility,      and mod assist for bathing and dressing upper and lower body.      Based on the patient's diagnosis, physical exam, and functional      history, she has potential functional progress which will result in      measurable gains while in inpatient rehab.  These gains will be of      substantial and practical use upon discharge to home in      facilitating mobility and self-care.  Interim changes since our      preadmission screening document are detailed in history of present      illness above.  5. Physiatrist will provide 24-hour management of medical needs as      well as oversight of the therapy plans/treatment and provide      guidance as appropriate regarding interaction of two.  Medical      problem list and plan are listed below.  6. A 24-hour rehab nursing will assist in management of the patient's      bowel and bladder needs as well as safety awareness.  We will      initiate a bed alarm today.  Also will follow up for skin care      needs,  pain management, mental status, nutritional needs, and      integration of therapy concepts and techniques.  7. PT will assess and treat  for lower extremity strength, knee range      of motion, stability, adaptive equipment, functional mobility, and      gait with goals at supervision to modified independent.  8. OT will assess and treat for upper extremity use ADLs, safety      awareness, adaptive techniques and equipment, and family education      with goals at a supervision to modified independent level.  9. Case Management and social worker will assess and treat for      psychosocial issues and discharge planning.  10.Team conferences will be held weekly to assess progress towards      goals and to determine barriers to discharge.  11.The patient has demonstrated sufficient medical stability and      exercise capacity to tolerate at least 3 hours of therapy per day      at least 5 days per week.  12.Estimated length of stay is approximately 7-10 days.  Prognosis is      good.   MEDICAL PROBLEM LIST AND PLAN:  1. Deep venous thrombosis prophylaxis with Coumadin with goal INR 2-3.      Watch CBC and for signs and symptoms of bleeding.  Check CBC      Monday.  2. Postoperative acute renal insufficiency:  Medications have been      held including hydrochlorothiazide, Benicar, and allopurinol.      Creatinine has normalized.  We will follow electrolytes and adjust      appropriately going forward.  The patient's n.p.o. intake is      improving as well.  3. Hyponatremia:  We will maintain 1800 mL fluid restriction for now.      Lot of this hyponatremia is likely located at the patient's      diuretic use and we probably can relax this.  4. Klebsiella urinary tract infection:  Cipro for 5 days ending November 21, 2008.  5. Hypertension:  We will continue Norvasc, Lopressor, and hydralazine      for now t.i.d.  6. Diabetes:  Starlix 120 mg t.i.d.  The patient also has had Lantus       added.  We will follow CBGs at meals and at bedtime.  Observe with      dietary intake and exercise how these affect her sugars going      forward.  Likely sugars will go down with treatment of urinary      tract infection.  7. Gout:  Uric acid is normal.  We will watch for signs and symptoms.      She had no active gout on examination today.  8. Postoperative anemia:  She is going to be transfused 1 unit of      packed red blood cells today.  We will follow CBC serially.  There      were no overt signs of bleeding on exam today.      Meredith Staggers, M.D.  Electronically Signed     ZTS/MEDQ  D:  11/19/2008  T:  11/20/2008  Job:  [id number]

## 2010-12-12 NOTE — Consult Note (Signed)
[name], BUEHRER NO.:  [id number]   MEDICAL RECORD NO.:  [id number]          PATIENT TYPE:  INP   LOCATION:  [id number]                         FACILITY:  [facility]   PHYSICIAN:  Domingo Mend, M.D. DATE OF BIRTH:  09/06/1928   DATE OF CONSULTATION:  11/17/2008  DATE OF DISCHARGE:                                 CONSULTATION   REQUESTING M.D.:  Dr. Joni Fears of orthopedics.   REASON FOR CONSULTATION:  Increasing BUN and creatinine.   HISTORY OF PRESENT ILLNESS:  Ms. Cutbirth is a pleasant 77 year old African  American woman who has a history of hypertension, type 2 diabetes  mellitus, gout, asthma, and a seizure disorder who was admitted by Dr.  Durward Fortes on November 16, 2008 for end-stage right knee osteoarthritis for  a right total knee replacement.  Of note on her preop blood work done on  April 15, she had a creatinine that was noted to be 1.07 and today it  has risen to 1.98 and 1.77 prompting a consultation to [facility]  hospitalist.  The patient is now status post a total knee replacement.  Her right knee is in a motion device.  She is complaining of a lot of  pain and is a bit drowsy I suspect from the pain medication that she has  received but other than this, she has no complaints.   ALLERGIES:  She has stated allergies to Tramadol and to ACE inhibitors.   PAST MEDICAL HISTORY:  Significant for:  1. Hypertension.  2. Type 2 diabetes mellitus.  3. Gout.  4. Asthma.  5. Seizure disorder, maintained on Keppra.  6. History of breast cancer, status post chemotherapy.   CURRENT MEDICATIONS:  1. Allopurinol 100 mg daily.  2. Norvasc 10 mg daily.  3. Ancef.  4. Colace.  5. Lovenox.  6. Hydrochlorothiazide.  7. Dilaudid.  8. Sliding scale insulin.  9. Keppra.  10.Lopressor.  11.Singulair.  12.Starlix.  13.Benicar.  14.Protonix.  15.Coumadin.  16.Robaxin.  17.PCA pump.   SOCIAL HISTORY:  She lives in [place], [place], is here in  [place] with her daughter who will help her through the convalescence  period after her surgery.  No alcohol, tobacco or illicit drug use.   FAMILY HISTORY:  Noncontributory in this elderly lady.   REVIEW OF SYSTEMS:  Negative except as already mentioned in the HPI.   PHYSICAL EXAMINATION:  VITAL SIGNS:  Today's vital signs include a blood  pressure of 132/82, heart rate 89, respirations 18, O2 sats 98% on 2 L  with a temp 98.1.  GENERAL:  She is drowsy but arousable to sternal rub.  HEENT:  Normocephalic, atraumatic.  Her pupils are equal and reactive to  light and accommodation with intact extraocular movements.  She is very  hard of hearing.  NECK:  Supple.  No JVD.  No lymphadenopathy.  No bruits.  No goiter.  HEART:  Regular rate and rhythm with no murmurs, rubs or gallops.  LUNGS:  Clear to auscultation bilaterally.  ABDOMEN:  Soft, nontender, nondistended with positive bowel sounds.  EXTREMITIES:  Left has no edema.  Her right is in a motion device and  fully wrapped in a clean dressing.   LABORATORY DATA:  Labs today include a sodium of 134, potassium 4.1,  chloride 99, bicarb 26, BUN 13, creatinine 1.77, glucose 193.  WBCs  12.2, hemoglobin 12.8, and platelet count of 270.  Her A1c is 6.5.   ASSESSMENT/PLAN:  1. Her right total knee replacement is as per orthopedic surgery.  2. Acute renal insufficiency which I believe is likely secondary to a      combination of her ARB plus diuretic plus allopurinol in the      presence of some mild volume contraction secondary to her decreased      PO intake and possible blood loss during her recent surgery.  Will      hold all the above drugs and start her on IV fluids.  If her      creatinine fails to respond, will check a renal ultrasound to rule      out an obstruction.  3. Type 2 diabetes mellitus.  She has had elevated CBGs.  Will start      her on low dose Lantus 5 units daily.  4. Hypertension, currently well controlled.   Will, however, have to      follow with cessation of her Benicar and hydrochlorothiazide.  5. Leukocytosis.  She did have a urinary tract infection noted on      preop blood work with cultures consistent with Klebsiella      pneumoniae.  She was treated with Bactrim, however, cultures have      shown that the organism is resistant to Bactrim.  Will start her on      Cipro for a total of five days.  6. Seizure disorder.  Will continue her on her Keppra.   We appreciate this consultation and we will follow the patient along  with you.      Domingo Mend, M.D.  Electronically Signed     EH/MEDQ  D:  11/17/2008  T:  11/17/2008  Job:  [id number]

## 2010-12-12 NOTE — Discharge Summary (Signed)
Laura, Clements NO.:  [id number]   MEDICAL RECORD NO.:  [id number]          PATIENT TYPE:  IPS   LOCATION:  [id number]                         FACILITY:  [facility]   PHYSICIAN:  Meredith Staggers, M.D.DATE OF BIRTH:  September 30, 1928   DATE OF ADMISSION:  11/19/2008  DATE OF DISCHARGE:                               DISCHARGE SUMMARY   DISCHARGE DIAGNOSES:  1. Right total knee replacement on November 16, 2008, secondary to      degenerative joint disease and pain management.  2. Coumadin for deep vein thrombosis prophylaxis.  3. Postoperative acute renal insufficiency resolved.  4. Hyponatremia resolved.  5. Klebsiella urinary tract infection.  6. Hypertension.  7. Seizure disorder.  8. Asthma.  9. Non-insulin-dependent diabetes mellitus.  10.Gout.  11.Gastroesophageal flex disease.  12.Anemia.  13.Anxiety.   This is a 77 year old female with history of breast cancer and seizure  disorder, admitted on November 16, 2008, with end-stage changes of the  right knee and no relief with conservative care.  Underwent a right  total knee replacement on November 16, 2008, per Dr. Durward Fortes 50% partial  weightbearing and Coumadin added for deep vein thrombosis prophylaxis.  Postoperative mild increase in creatinine from 1.07-1.98.  Medicine Team  consulted, hydrochlorothiazide, Benicar, and allopurinol were held,  secondary to elevated creatinine with gentle intravenous fluids  administered.  Creatinine improved to 0.83.  Sodium of 130, placed on  fluid restriction with sodium stabilized to 133.  Gout remained without  an issue.  Postoperative anemia 9.1 and monitored.  No chest pain or  shortness of breath.  Mild confusion postop, monitored with decrease in  narcotics.  She was attending therapies needing some cues to maintain  her weightbearing.  She was treated for Klebsiella urinary tract  infection during her rehabilitation course.  Monitoring of blood sugar  with Lantus insulin  added at 5 units.  She later received a transfusion  of 1 unit for packed red blood cells on the day of admission at rehab  services on November 19, 2008, for hemoglobin of 9.1.  She was admitted for  comprehensive rehab program.   PAST MEDICAL HISTORY:  See discharge diagnoses.  No alcohol or tobacco.   ALLERGIES:  ACE inhibitors and Ultram.   SOCIAL HISTORY:  Lives alone in [place], [place].  She plans to  stay with her daughter in [place].   FUNCTIONAL HISTORY PRIOR TO ADMISSION:  Independent with a cane.  She  did not drive.   FUNCTIONAL STATUS UPON ADMISSION TO REHAB SERVICES:  She was +2 total  assist for ambulation 2-3 feet, moderate assist sit and stand, +2 total  assist stand to sit, moderate assist bathing upper and lower body.   MEDICATIONS PRIOR TO ADMISSION:  1. Metoprolol 100 mg twice daily.  2. Diovan 160/12.5 mg daily.  3. Aspirin daily.  4. Potassium chloride 10 mEq daily.  5. Starlix 120 mg 3 times daily.  6. Aciphex 20 mg daily.  7. Allopurinol 100 mg daily.  8. Os-Cal daily.  9. Singulair 10 mg daily.  10.Albuterol inhaler as  needed.  11.Norvasc 10 mg daily.  12.Keppra 500 mg 3 times daily.  13.Flonase as needed.   PHYSICAL EXAMINATION:  VITAL SIGNS:  Blood pressure 146/74, pulse 83,  temperature 98.2, and respirations 18.  GENERAL:  This was an alert female.  Expression was somewhat flat.  NEUROLOGIC:  She was able to name person, place, and date of birth.  Follows 3-step commands.  Deep tendon reflexes were 2+.  Calves remained  cool without swelling, erythema, or nontender.  Sensation intact to  light touch.  Right knee with some moderate swelling, staples in place.  No drainage.   Rehabilitation hospital course the patient was admitted to inpatient  rehab services with therapies initiated on a 3-hour daily basis  consisting of physical therapy, occupational therapy and rehabilitation  nursing.  The following issues were addressed during the  patient's  rehabilitation stay.  Pertaining to Ms. [name]'s right total knee  replacement on November 16, 2008, staples remained intact.  She was 50%  partial weightbearing followed by Dr. Durward Fortes.  She did have some  modest soft tissue swelling.  She had been placed on Keflex for some  suspect cellulitis.  She remained afebrile.  She remained on Coumadin  for deep vein thrombosis prophylaxis.  Venous Doppler studies had been  ordered with results pending.  Latest INR of 2.2.  She did receive x-  rays of the right knee showing no acute complications.  Postoperative  acute renal insufficiency.  Her creatinine was now 0.7.  Her  hydrochlorothiazide and Benicar remained on hold.  She remained on  Norvasc, Lopressor, and hydralazine for blood pressure control.  She  continued on her Keppra for history of seizure disorder, which was  without issue during her rehab course.  Blood sugars were well-  controlled and Lantus insulin had been added to her regimen as well as  she would continue on her Starlix.  She did have some bouts of anxiety  during her course.  Klonopin and Xanax were added to her regimen again  with overall good results.   The patient received weekly collaborative interdisciplinary team  conferences to discuss estimated length of stay, any barriers to  discharge and ongoing family teaching.  She was minimal assist for  overall mobility needing some cues for short-term memory deficit.  She  was quite anxious during her therapy times, but this continued to  improve.  Her overall appetite again continued to improve also during  her rehab course.  Pain management was monitored closely with the use of  narcotics.  She was using Vicodin on limited basis for pain as well as  Robaxin for muscle spasms.  She was to be discharged to home with her  daughter and assistance as needed.   LATEST LABORATORIES:  Hemoglobin 9.2, hematocrit 26, and platelet  318,000.  Sodium 134, potassium 3.7, BUN  17, and creatinine 0.9.   DISCHARGE MEDICATIONS:  1. Coumadin latest dose of 1 mg, which she would remain until Dec 16, 2008, and stop.  2. Starlix 120 mg 3 times daily.  3. Protonix 40 mg daily.  4. Norvasc 10 mg daily.  5. Singulair 10 mg daily.  6. Lopressor 100 mg twice daily.  7. Keppra 500 mg 3 times daily.  8. Hydralazine 50 mg 3 times daily.  9. Lantus insulin 8 units subcutaneously at night time.  10.Trazodone 50 mg at bedtime as needed for sleep.  11.Keflex 250 mg 4 times daily to complete  a 10-day course.  12.Klonopin 0.5 mg daily every evening as well as 0.25 mg every a.m.  13.Ferrous sulfate 325 mg twice daily.  14.Flonase 1 spray daily as needed.  15.Robaxin 500 mg every 6 hours as needed for spasms.  16.Vicodin 5/325 one to two tablets every 4 hours as needed for pain,      dispense of 90 tablets.   DIET:  Diabetic diet.   SPECIAL INSTRUCTIONS:  Home health nurse for Coumadin therapy with next  INR on Nov 29, 2008, to complete Coumadin protocol.   FOLLOWUP:  Dr. Durward Fortes, Orthopedic Services, call for appointment.      Lauraine Rinne, P.A.      Meredith Staggers, M.D.  Electronically Signed    DA/MEDQ  D:  11/26/2008  T:  11/26/2008  Job:  [id number]   cc:   Vonna Kotyk. Durward Fortes, M.D.  Dr. Blair Dolphin

## 2010-12-15 NOTE — Discharge Summary (Signed)
NAMEESMA, KOOI                    ACCOUNT NO.:  [id number]   MEDICAL RECORD NO.:  [id number]          PATIENT TYPE:  INP   LOCATION:  5021                         FACILITY:  [facility]   PHYSICIAN:  Vonna Kotyk. Whitfield, M.D.DATE OF BIRTH:  Dec 26, 1928   DATE OF ADMISSION:  11/16/2008  DATE OF DISCHARGE:  11/19/2008                               DISCHARGE SUMMARY   ADMISSION DIAGNOSIS:  Osteoarthritis of the right knee.   DISCHARGE DIAGNOSES:  1. Osteoarthritis of the right knee.  2. History of hypertension.  3. History of breast cancer.  4. History of seizures.  5. History of anemia.  6. Non-insulin-dependent diabetes mellitus.  7. Acute renal failure.  8. Posthemorrhagic anemia.  9. Hyperosmolality.  10.    Dictation ended at this point.      Mike Craze Petrarca, P.A.-C.      Vonna Kotyk. Durward Fortes, M.D.  Electronically Signed    BDP/MEDQ  D:  12/30/2008  T:  12/31/2008  Job:  [id number]

## 2010-12-15 NOTE — Discharge Summary (Signed)
NAMENADEEN, DUTCH                    ACCOUNT NO.:  [id number]   MEDICAL RECORD NO.:  [id number]          PATIENT TYPE:  INP   LOCATION:  5021                         FACILITY:  [facility]   PHYSICIAN:  Vonna Kotyk. Whitfield, M.D.DATE OF BIRTH:  Jun 12, 1929   DATE OF ADMISSION:  11/16/2008  DATE OF DISCHARGE:  11/19/2008                               DISCHARGE SUMMARY   ADMISSION DIAGNOSIS:  Osteoarthritis of the right knee.   DISCHARGE DIAGNOSES:  1. Osteoarthritis of the right knee.  2. History of hypertension.  3. History of left breast cancer.  4. History of seizure.  5. History of non-insulin-dependent diabetes mellitus.  6. History of anemia.  7. Acute renal failure.  8. Urinary tract infection.  9. Post-hemorrhagic anemia.  10.Hyperosmolality.   PROCEDURE:  Right total knee arthroplasty.   HISTORY:  Ms. Wiltfong is a 77 year old African American female with  intermittent right knee pain which worsened over Thanksgiving.  She was  seen in the Urgent Care at [facility] and placed on [place].  Pain has  been worsening since then to the point where she has continuous moderate  aching pain.  Pain with ambulation and sleep.  She has failed  conservative treatment and is now indicated for total knee arthroplasty.   HOSPITAL COURSE:  A 77 year old African American female admitted on  November 16, 2008.  After appropriate laboratory studies were obtained, was  taken to the operating room.  After 1 gram of Ancef IV, underwent a  right total knee arthroplasty.  She tolerated the procedure well.  She  was placed on an SSI glycemic control of moderate correction.  Continued  on Ancef 1 gram IV q.6 h. x3 doses.  Started on Lovenox 30 mg subcu q.12  h. until her Coumadin became therapeutic.  Began on CPM 0-60 degrees for  6-8 hours per day increasing by 10 degrees per day.  Consults with PT,  OT, and Rehab were made.  Partial weightbearing with 50% body weight.  She was allowed out of bed to chair the  following day.  She did have  movement of an ileus on the first postop day and was placed on sips and  chips only.  She was weaned off her PCA and her O2.  IV fluid was  increased to 150 mL/hour for 3 hours and then back to 80 mL/hour.  KUB  to rule out an ileus was ordered.  Strict I's and O's and call if urine  was up or less than 30 mL/hour.  A consultation with the hospitalist was  also ordered.  The hydrochlorothiazide, Benicar, and allopurinol were  also held.  The IV fluid was changed to normal saline at 125 mL/hour per  12 hours, then back to 75 per hour.  Portable chest x-ray was ordered as  well as UA, urine culture and sensitivity.  Started on Cipro 250 mg IV  b.i.d. for 5 days.  Lantus 5 units insulin at bedtime.  Cipro was  changed to 200 mg IV b.i.d.  A Doppler of the right leg was ordered on  November 18, 2008.  Percocet was discontinued because of some confusion and  she was placed on Norco 5/325 one to two tabs every 4 hours as needed  for pain.  Her Lovenox and Protime improved.  We did restrict her free  fluids to less than 1.2 L/day.  Hydralazine was added 50 mg.  Urine  sodium with serum osmolality and serum uric acid was ordered.  IV fluids  was discontinued to 60 mL/hour.  This was all done by [facility] Team B.  Foley was continued.  She was transfused 1 unit of packed cells on November 19, 2008.  Lantus insulin was increased to 8 units subcu at bedtime.  Cipro increased to 400 mg IV q.12 h.  She had improved to the point  where she was a rehab candidate and she was discharged to rehab on November 19, 2008 to continue with her physical and occupational therapy.  Abdominal x-rays on November 17, 2008 showed no acute findings.  No  evidence of ileus.   LABORATORY STUDIES:  Hemoglobin 13.7, hematocrit 39.4%, white count  6300, and platelets 258,000.  Discharge hemoglobin 9.1, hematocrit  25.9%, white count 10,100, and platelets 241,000.  Preop Protime 12.7,  INR 0.9, and PTT  35.  Discharge Protime 30.6 and INR 2.7.  Chemistries:  Sodium 138, potassium 4.3, chloride 98, CO2 31, glucose 142, BUN 16, and  creatinine 1.07.  Discharge sodium 133, potassium 3.9, chloride 96, CO2  28, glucose 151, BUN 8, and creatinine 0.83.  GFR was greater than 60  preop.  She dropped to 30 mL/hour on November 17, 2008.  She improved to  the time of discharge at greater than 60.  Preop total protein 7.2,  albumin 3.6, AST 30, ALT 23, ALP 218, and total bilirubin 0.7.  Serum  osmolality of 423 was 274.  Uric acid 3.2.  Hemoglobin A1c was 6.5.  Aldosterone was 1.  Urine sodium was 22.  Preop urinalysis revealed  moderate leukocyte esterase, few epithelials, too numerous to count  white cells, red cells 0-2, and bacteria many with white cells in  clumps.  On November 19, 2008 revealed small amount of leukocyte esterase,  rare epithelials, 3-6 whites, 0-2 reds, and rare bacteria.  Blood type  is O+ and antibody screen is negative.  Urine culture on November 11, 2008  revealed greater than 100,000 colonies of Klebsiella pneumonia sensitive  to ceftriaxone, cephazolin, ciprofloxacin, gentamicin, levofloxacin,  nitrofurantoin, and tobramycin.   DISCHARGE INSTRUCTIONS:  She is to continue with her physical and  occupational therapy as per protocol in the rehab section.  Her  medications will be continued by the rehab staff.  She was discharged in  improved condition.      Mike Craze Petrarca, P.A.-C.      Vonna Kotyk. Durward Fortes, M.D.  Electronically Signed    BDP/MEDQ  D:  12/30/2008  T:  12/31/2008  Job:  [id number]

## 2011-09-03 DIAGNOSIS — R350 Frequency of micturition: Secondary | ICD-10-CM | POA: Diagnosis not present

## 2011-09-03 DIAGNOSIS — E663 Overweight: Secondary | ICD-10-CM | POA: Diagnosis not present

## 2011-09-03 DIAGNOSIS — I10 Essential (primary) hypertension: Secondary | ICD-10-CM | POA: Diagnosis not present

## 2011-09-03 DIAGNOSIS — F329 Major depressive disorder, single episode, unspecified: Secondary | ICD-10-CM | POA: Diagnosis not present

## 2011-09-03 DIAGNOSIS — E782 Mixed hyperlipidemia: Secondary | ICD-10-CM | POA: Diagnosis not present

## 2011-09-03 DIAGNOSIS — IMO0001 Reserved for inherently not codable concepts without codable children: Secondary | ICD-10-CM | POA: Diagnosis not present

## 2011-10-22 ENCOUNTER — Other Ambulatory Visit: Payer: Self-pay

## 2011-10-22 ENCOUNTER — Encounter (HOSPITAL_COMMUNITY): Payer: Self-pay | Admitting: *Deleted

## 2011-10-22 ENCOUNTER — Emergency Department (HOSPITAL_COMMUNITY)
Admission: EM | Admit: 2011-10-22 | Discharge: 2011-10-23 | Disposition: A | Discharge status: Home / Self Care | Payer: Medicare Other | Attending: Emergency Medicine | Admitting: Emergency Medicine

## 2011-10-22 DIAGNOSIS — R404 Transient alteration of awareness: Secondary | ICD-10-CM | POA: Diagnosis not present

## 2011-10-22 DIAGNOSIS — R51 Headache: Secondary | ICD-10-CM | POA: Diagnosis not present

## 2011-10-22 DIAGNOSIS — I10 Essential (primary) hypertension: Secondary | ICD-10-CM

## 2011-10-22 DIAGNOSIS — E119 Type 2 diabetes mellitus without complications: Secondary | ICD-10-CM | POA: Diagnosis not present

## 2011-10-22 DIAGNOSIS — K219 Gastro-esophageal reflux disease without esophagitis: Secondary | ICD-10-CM | POA: Insufficient documentation

## 2011-10-22 DIAGNOSIS — R6889 Other general symptoms and signs: Secondary | ICD-10-CM | POA: Insufficient documentation

## 2011-10-22 DIAGNOSIS — R42 Dizziness and giddiness: Secondary | ICD-10-CM

## 2011-10-22 DIAGNOSIS — H409 Unspecified glaucoma: Secondary | ICD-10-CM | POA: Insufficient documentation

## 2011-10-22 HISTORY — DX: Gastro-esophageal reflux disease without esophagitis: K21.9

## 2011-10-22 HISTORY — DX: Essential (primary) hypertension: I10

## 2011-10-22 NOTE — ED Notes (Signed)
Pt ao x 4.  States she hit her forehead on the door last night, but denies falling or pain at this time.  States she feels like she is spinning and states she feels pain to R arm.

## 2011-10-22 NOTE — ED Notes (Addendum)
Around 9 pm pt was laying in bed, felt need to urinate. When she stood up she began feeling dizzy.  Lay back down (did not fall).  Pt states increased urination today.  AO X 4.  Neg stroke scale per ems.  RBBB on EMS ekg. CBG 119.  Pt has hx of tinnitus but states taht this feels different.

## 2011-10-23 ENCOUNTER — Encounter (HOSPITAL_COMMUNITY): Payer: Self-pay | Admitting: Emergency Medicine

## 2011-10-23 MED ORDER — ACETAMINOPHEN 325 MG PO TABS
650.0000 mg | ORAL_TABLET | Freq: Once | ORAL | Status: AC
  Administered 2011-10-23: 650 mg via ORAL
  Filled 2011-10-23: qty 2

## 2011-10-23 NOTE — Discharge Instructions (Signed)

## 2011-10-23 NOTE — ED Provider Notes (Signed)
History     CSN: [id number]  Arrival date & time 10/22/11  2236   First MD Initiated Contact with Patient 10/22/11 2314      No chief complaint on file.   (Consider location/radiation/quality/duration/timing/severity/associated sxs/prior treatment) HPI Comments: Patient reports at about 32 tonight while she was sitting down watching television she had an episode of dizziness. She-like and the dizziness little bit of vertigo but she reports that she has not moved her head or tried to sit up or stand up and it was not associated with any nausea or vomiting. She does not think that it was a vertigo episode. She also reports that she has a history of tinnitus which she may have actually mistaken for vertigo. She denies any ringing in her ear although she said that she did not think this was an episode of tinnitus. She reports that she did not have chest pain, headache, focal numbness or weakness. She was able to stand up and walk into her daughter's bedroom to tell her that she was feeling sick and wanted to be evaluated. EMS was called who transported her here to the emergency department. The patient reports that the dizzy episode has resolved. She reports a mild headache across her forehead which reports she gets sometimes if she is hungry and has not eaten. She does have a long-standing history of hypertension and reports that she has taken all of her medications as usual. She denies prior history of stroke. She denies fever chills, cough, URI symptoms, diarrhea, vomiting.  The history is provided by the patient and a relative.    Past Medical History  Diagnosis Date  . Hypertension   . Diabetes mellitus   . Glaucoma   . GERD (gastroesophageal reflux disease)   . Tinnitus     Past Surgical History  Procedure Date  . Breast lumpectomy     bil for breast ca    History reviewed. No pertinent family history.  History  Substance Use Topics  . Smoking status: Not on file  . Smokeless  tobacco: Not on file  . Alcohol Use:     OB History    Grav Para Term Preterm Abortions TAB SAB Ect Mult Living                  Review of Systems  Constitutional: Negative for fever and chills.  HENT: Negative for congestion, rhinorrhea and neck stiffness.   Respiratory: Negative for cough and shortness of breath.   Cardiovascular: Negative for chest pain.  Gastrointestinal: Negative for nausea, vomiting and diarrhea.  Genitourinary: Positive for urgency. Negative for dysuria and flank pain.  Musculoskeletal: Negative for back pain.  Neurological: Positive for dizziness and headaches. Negative for syncope, weakness, light-headedness and numbness.  All other systems reviewed and are negative.    Allergies  Ace inhibitors  Home Medications  No current outpatient prescriptions on file.  BP 189/89  Temp(Src) 98.1 F (36.7 C) (Oral)  Resp 21  SpO2 96%  Physical Exam  Vitals reviewed. Constitutional: She is oriented to person, place, and time. She appears well-developed and well-nourished.  HENT:  Head: Normocephalic and atraumatic.  Eyes: Pupils are equal, round, and reactive to light.  Neck: Normal range of motion. Neck supple.  Cardiovascular: Normal rate.   Pulmonary/Chest: Effort normal and breath sounds normal. No respiratory distress.  Abdominal: Soft. Bowel sounds are normal.  Neurological: She is alert and oriented to person, place, and time. She has normal strength and normal reflexes.  No cranial nerve deficit or sensory deficit. She displays a negative Romberg sign. Coordination and gait normal. GCS eye subscore is 4. GCS verbal subscore is 5. GCS motor subscore is 6.       Gait intact, no arm drift, 5/5 strength in all 4 extremities  Skin: Skin is warm and dry.  Psychiatric: She has a normal mood and affect.    ED Course  Procedures (including critical care time)   Labs Reviewed  URINE CULTURE  LAB REPORT - SCANNED  URINE CULTURE   No results  found.   1. Hypertension   2. Spell of dizziness       MDM   EKG performed at 22:53, shows sinus rhythm at a rate of 70. Right bundle branch block is present. No ST or T wave abnormalities are present. Left axis deviation is noted. EKG also shows left anterior fascicular block criteria. Ears no change compared to EKG from 11/22/2008.  Patient's symptoms seem to have resolved. She is indeed hypertensive here but there is no current evidence of endorgan damage. Patient is reassured. Patient's relative also feels that the patient may have simply gotten anxious. At this point I do not find any evidence of stroke, vertigo, coronary syndrome. I feel she is safe for discharge to home and she can followup with her primary care physician. Patient's family members also in agreement.        Saddie Benders. Dorna Mai, MD 10/24/11 [phone]

## 2011-10-23 NOTE — ED Notes (Signed)
Dr Dorna Mai is aware of pt elevated bp and feels that,  B/c pt is not dizzy, has no vision changes and has a diminished ha, that she is ok to go home.

## 2011-10-23 NOTE — ED Notes (Signed)
Pt states she has a headache which is typical of the headaches she experiences when she's hungry.  Dr Dorna Mai notified and stated to give pt snack.  Pt denies dizziness.

## 2011-10-24 LAB — URINE CULTURE
Colony Count: NO GROWTH
Culture  Setup Time: 201303260317
Culture: NO GROWTH

## 2011-12-14 DIAGNOSIS — I10 Essential (primary) hypertension: Secondary | ICD-10-CM | POA: Diagnosis not present

## 2011-12-14 DIAGNOSIS — E119 Type 2 diabetes mellitus without complications: Secondary | ICD-10-CM | POA: Diagnosis not present

## 2011-12-14 DIAGNOSIS — E782 Mixed hyperlipidemia: Secondary | ICD-10-CM | POA: Diagnosis not present

## 2011-12-14 DIAGNOSIS — R197 Diarrhea, unspecified: Secondary | ICD-10-CM | POA: Diagnosis not present

## 2012-03-27 DIAGNOSIS — E119 Type 2 diabetes mellitus without complications: Secondary | ICD-10-CM | POA: Diagnosis not present

## 2012-03-27 DIAGNOSIS — E782 Mixed hyperlipidemia: Secondary | ICD-10-CM | POA: Diagnosis not present

## 2012-03-27 DIAGNOSIS — I10 Essential (primary) hypertension: Secondary | ICD-10-CM | POA: Diagnosis not present

## 2012-08-19 DIAGNOSIS — R42 Dizziness and giddiness: Secondary | ICD-10-CM | POA: Diagnosis not present

## 2012-08-19 DIAGNOSIS — R569 Unspecified convulsions: Secondary | ICD-10-CM | POA: Diagnosis not present

## 2012-09-01 DIAGNOSIS — I10 Essential (primary) hypertension: Secondary | ICD-10-CM | POA: Diagnosis not present

## 2012-09-01 DIAGNOSIS — R569 Unspecified convulsions: Secondary | ICD-10-CM | POA: Diagnosis not present

## 2012-09-01 DIAGNOSIS — E119 Type 2 diabetes mellitus without complications: Secondary | ICD-10-CM | POA: Diagnosis not present

## 2012-11-20 ENCOUNTER — Encounter: Payer: Self-pay | Admitting: Nurse Practitioner

## 2012-11-21 ENCOUNTER — Ambulatory Visit: Payer: Self-pay | Admitting: Nurse Practitioner

## 2012-11-28 DIAGNOSIS — E1129 Type 2 diabetes mellitus with other diabetic kidney complication: Secondary | ICD-10-CM | POA: Diagnosis not present

## 2012-11-28 DIAGNOSIS — Z0000 Encounter for general adult medical examination without abnormal findings: Secondary | ICD-10-CM | POA: Diagnosis not present

## 2012-11-28 DIAGNOSIS — N183 Chronic kidney disease, stage 3 unspecified: Secondary | ICD-10-CM | POA: Diagnosis not present

## 2012-11-28 DIAGNOSIS — I10 Essential (primary) hypertension: Secondary | ICD-10-CM | POA: Diagnosis not present

## 2012-11-28 DIAGNOSIS — E782 Mixed hyperlipidemia: Secondary | ICD-10-CM | POA: Diagnosis not present

## 2013-03-27 DIAGNOSIS — I10 Essential (primary) hypertension: Secondary | ICD-10-CM | POA: Diagnosis not present

## 2013-03-27 DIAGNOSIS — E1129 Type 2 diabetes mellitus with other diabetic kidney complication: Secondary | ICD-10-CM | POA: Diagnosis not present

## 2013-03-27 DIAGNOSIS — G609 Hereditary and idiopathic neuropathy, unspecified: Secondary | ICD-10-CM | POA: Diagnosis not present

## 2013-03-27 DIAGNOSIS — E782 Mixed hyperlipidemia: Secondary | ICD-10-CM | POA: Diagnosis not present

## 2013-05-04 DIAGNOSIS — Z23 Encounter for immunization: Secondary | ICD-10-CM | POA: Diagnosis not present

## 2013-06-01 DIAGNOSIS — IMO0001 Reserved for inherently not codable concepts without codable children: Secondary | ICD-10-CM | POA: Diagnosis not present

## 2013-06-01 DIAGNOSIS — R109 Unspecified abdominal pain: Secondary | ICD-10-CM | POA: Diagnosis not present

## 2013-06-01 DIAGNOSIS — R1013 Epigastric pain: Secondary | ICD-10-CM | POA: Diagnosis not present

## 2013-06-01 DIAGNOSIS — I10 Essential (primary) hypertension: Secondary | ICD-10-CM | POA: Diagnosis not present

## 2013-06-22 DIAGNOSIS — N183 Chronic kidney disease, stage 3 unspecified: Secondary | ICD-10-CM | POA: Diagnosis not present

## 2013-06-22 DIAGNOSIS — I10 Essential (primary) hypertension: Secondary | ICD-10-CM | POA: Diagnosis not present

## 2013-06-22 DIAGNOSIS — E1129 Type 2 diabetes mellitus with other diabetic kidney complication: Secondary | ICD-10-CM | POA: Diagnosis not present

## 2013-07-31 DIAGNOSIS — I10 Essential (primary) hypertension: Secondary | ICD-10-CM | POA: Diagnosis not present

## 2013-09-19 ENCOUNTER — Other Ambulatory Visit: Payer: Self-pay | Admitting: Neurology

## 2013-09-21 NOTE — Telephone Encounter (Signed)
Patient was last seen in Feb of last year.  No showed last appt scheduled.

## 2013-10-16 DIAGNOSIS — H269 Unspecified cataract: Secondary | ICD-10-CM | POA: Diagnosis not present

## 2013-10-16 DIAGNOSIS — H2510 Age-related nuclear cataract, unspecified eye: Secondary | ICD-10-CM | POA: Diagnosis not present

## 2013-10-30 ENCOUNTER — Other Ambulatory Visit: Payer: Self-pay | Admitting: Neurology

## 2013-11-28 ENCOUNTER — Other Ambulatory Visit: Payer: Self-pay | Admitting: Neurology

## 2013-12-18 DIAGNOSIS — I10 Essential (primary) hypertension: Secondary | ICD-10-CM | POA: Diagnosis not present

## 2013-12-18 DIAGNOSIS — R569 Unspecified convulsions: Secondary | ICD-10-CM | POA: Diagnosis not present

## 2013-12-18 DIAGNOSIS — E785 Hyperlipidemia, unspecified: Secondary | ICD-10-CM | POA: Diagnosis not present

## 2013-12-18 DIAGNOSIS — Z0000 Encounter for general adult medical examination without abnormal findings: Secondary | ICD-10-CM | POA: Diagnosis not present

## 2013-12-18 DIAGNOSIS — N390 Urinary tract infection, site not specified: Secondary | ICD-10-CM | POA: Diagnosis not present

## 2013-12-18 DIAGNOSIS — IMO0001 Reserved for inherently not codable concepts without codable children: Secondary | ICD-10-CM | POA: Diagnosis not present

## 2013-12-18 DIAGNOSIS — C50919 Malignant neoplasm of unspecified site of unspecified female breast: Secondary | ICD-10-CM | POA: Diagnosis not present

## 2014-01-02 ENCOUNTER — Other Ambulatory Visit: Payer: Self-pay | Admitting: Neurology

## 2014-01-11 DIAGNOSIS — I7090 Unspecified atherosclerosis: Secondary | ICD-10-CM | POA: Diagnosis not present

## 2014-01-11 DIAGNOSIS — H2510 Age-related nuclear cataract, unspecified eye: Secondary | ICD-10-CM | POA: Diagnosis not present

## 2014-01-11 DIAGNOSIS — H35379 Puckering of macula, unspecified eye: Secondary | ICD-10-CM | POA: Diagnosis not present

## 2014-01-11 DIAGNOSIS — H40019 Open angle with borderline findings, low risk, unspecified eye: Secondary | ICD-10-CM | POA: Diagnosis not present

## 2014-01-11 DIAGNOSIS — H25019 Cortical age-related cataract, unspecified eye: Secondary | ICD-10-CM | POA: Diagnosis not present

## 2014-01-11 DIAGNOSIS — H5200 Hypermetropia, unspecified eye: Secondary | ICD-10-CM | POA: Diagnosis not present

## 2014-01-11 NOTE — Telephone Encounter (Signed)
Hasn't been seen in over 1 year.  No showed last appt.  We have noted on refills several times an appt is needed.

## 2014-01-26 DIAGNOSIS — H269 Unspecified cataract: Secondary | ICD-10-CM | POA: Diagnosis not present

## 2014-01-26 DIAGNOSIS — H2510 Age-related nuclear cataract, unspecified eye: Secondary | ICD-10-CM | POA: Diagnosis not present

## 2014-02-15 DIAGNOSIS — R05 Cough: Secondary | ICD-10-CM | POA: Diagnosis not present

## 2014-02-15 DIAGNOSIS — R059 Cough, unspecified: Secondary | ICD-10-CM | POA: Diagnosis not present

## 2014-03-12 ENCOUNTER — Other Ambulatory Visit: Payer: Self-pay | Admitting: Neurology

## 2014-04-19 DIAGNOSIS — R569 Unspecified convulsions: Secondary | ICD-10-CM | POA: Diagnosis not present

## 2014-04-19 DIAGNOSIS — E119 Type 2 diabetes mellitus without complications: Secondary | ICD-10-CM | POA: Diagnosis not present

## 2014-04-19 DIAGNOSIS — E782 Mixed hyperlipidemia: Secondary | ICD-10-CM | POA: Diagnosis not present

## 2014-04-19 DIAGNOSIS — G479 Sleep disorder, unspecified: Secondary | ICD-10-CM | POA: Diagnosis not present

## 2014-04-19 DIAGNOSIS — Z23 Encounter for immunization: Secondary | ICD-10-CM | POA: Diagnosis not present

## 2014-04-19 DIAGNOSIS — I10 Essential (primary) hypertension: Secondary | ICD-10-CM | POA: Diagnosis not present

## 2014-05-12 DIAGNOSIS — N6459 Other signs and symptoms in breast: Secondary | ICD-10-CM | POA: Diagnosis not present

## 2014-08-16 ENCOUNTER — Other Ambulatory Visit: Payer: Self-pay | Admitting: Internal Medicine

## 2014-08-16 DIAGNOSIS — Z6832 Body mass index (BMI) 32.0-32.9, adult: Secondary | ICD-10-CM | POA: Diagnosis not present

## 2014-08-16 DIAGNOSIS — E785 Hyperlipidemia, unspecified: Secondary | ICD-10-CM | POA: Diagnosis not present

## 2014-08-16 DIAGNOSIS — I10 Essential (primary) hypertension: Secondary | ICD-10-CM | POA: Diagnosis not present

## 2014-08-16 DIAGNOSIS — E1122 Type 2 diabetes mellitus with diabetic chronic kidney disease: Secondary | ICD-10-CM | POA: Diagnosis not present

## 2014-08-16 DIAGNOSIS — E663 Overweight: Secondary | ICD-10-CM | POA: Diagnosis not present

## 2014-08-16 DIAGNOSIS — G40909 Epilepsy, unspecified, not intractable, without status epilepticus: Secondary | ICD-10-CM | POA: Diagnosis not present

## 2014-08-16 DIAGNOSIS — E041 Nontoxic single thyroid nodule: Secondary | ICD-10-CM | POA: Diagnosis not present

## 2014-08-16 DIAGNOSIS — C50919 Malignant neoplasm of unspecified site of unspecified female breast: Secondary | ICD-10-CM | POA: Diagnosis not present

## 2014-08-16 DIAGNOSIS — N182 Chronic kidney disease, stage 2 (mild): Secondary | ICD-10-CM | POA: Diagnosis not present

## 2014-08-22 ENCOUNTER — Emergency Department (HOSPITAL_COMMUNITY): Payer: Medicare Other

## 2014-08-22 ENCOUNTER — Emergency Department (HOSPITAL_COMMUNITY)
Admission: EM | Admit: 2014-08-22 | Discharge: 2014-08-22 | Disposition: A | Discharge status: Home / Self Care | Payer: Medicare Other | Attending: Emergency Medicine | Admitting: Emergency Medicine

## 2014-08-22 ENCOUNTER — Encounter (HOSPITAL_COMMUNITY): Payer: Self-pay | Admitting: *Deleted

## 2014-08-22 DIAGNOSIS — R0602 Shortness of breath: Secondary | ICD-10-CM | POA: Diagnosis present

## 2014-08-22 DIAGNOSIS — Z87891 Personal history of nicotine dependence: Secondary | ICD-10-CM | POA: Diagnosis not present

## 2014-08-22 DIAGNOSIS — K219 Gastro-esophageal reflux disease without esophagitis: Secondary | ICD-10-CM | POA: Insufficient documentation

## 2014-08-22 DIAGNOSIS — Z79899 Other long term (current) drug therapy: Secondary | ICD-10-CM | POA: Insufficient documentation

## 2014-08-22 DIAGNOSIS — E119 Type 2 diabetes mellitus without complications: Secondary | ICD-10-CM | POA: Diagnosis not present

## 2014-08-22 DIAGNOSIS — J9801 Acute bronchospasm: Secondary | ICD-10-CM | POA: Diagnosis not present

## 2014-08-22 DIAGNOSIS — Z7982 Long term (current) use of aspirin: Secondary | ICD-10-CM | POA: Diagnosis not present

## 2014-08-22 DIAGNOSIS — Z8669 Personal history of other diseases of the nervous system and sense organs: Secondary | ICD-10-CM | POA: Insufficient documentation

## 2014-08-22 DIAGNOSIS — J209 Acute bronchitis, unspecified: Secondary | ICD-10-CM | POA: Diagnosis not present

## 2014-08-22 DIAGNOSIS — I10 Essential (primary) hypertension: Secondary | ICD-10-CM | POA: Diagnosis not present

## 2014-08-22 DIAGNOSIS — R05 Cough: Secondary | ICD-10-CM | POA: Diagnosis not present

## 2014-08-22 DIAGNOSIS — J40 Bronchitis, not specified as acute or chronic: Secondary | ICD-10-CM | POA: Diagnosis not present

## 2014-08-22 LAB — CBC WITH DIFFERENTIAL/PLATELET
Basophils Absolute: 0 10*3/uL (ref 0.0–0.1)
Basophils Relative: 0 % (ref 0–1)
Eosinophils Absolute: 0.1 10*3/uL (ref 0.0–0.7)
Eosinophils Relative: 2 % (ref 0–5)
HCT: 38.2 % (ref 36.0–46.0)
Hemoglobin: 12.6 g/dL (ref 12.0–15.0)
Lymphocytes Relative: 24 % (ref 12–46)
Lymphs Abs: 1.4 10*3/uL (ref 0.7–4.0)
MCH: 28.3 pg (ref 26.0–34.0)
MCHC: 33 g/dL (ref 30.0–36.0)
MCV: 85.8 fL (ref 78.0–100.0)
Monocytes Absolute: 0.6 10*3/uL (ref 0.1–1.0)
Monocytes Relative: 11 % (ref 3–12)
Neutro Abs: 3.5 10*3/uL (ref 1.7–7.7)
Neutrophils Relative %: 63 % (ref 43–77)
Platelets: 219 10*3/uL (ref 150–400)
RBC: 4.45 MIL/uL (ref 3.87–5.11)
RDW: 14.3 % (ref 11.5–15.5)
WBC: 5.7 10*3/uL (ref 4.0–10.5)

## 2014-08-22 LAB — BRAIN NATRIURETIC PEPTIDE: B Natriuretic Peptide: 113.3 pg/mL — ABNORMAL HIGH (ref 0.0–100.0)

## 2014-08-22 LAB — BASIC METABOLIC PANEL
Anion gap: 8 (ref 5–15)
BUN: 10 mg/dL (ref 6–23)
CO2: 31 mmol/L (ref 19–32)
Calcium: 9.3 mg/dL (ref 8.4–10.5)
Chloride: 100 mmol/L (ref 96–112)
Creatinine, Ser: 1.28 mg/dL — ABNORMAL HIGH (ref 0.50–1.10)
GFR calc Af Amer: 44 mL/min — ABNORMAL LOW (ref >90)
GFR calc non Af Amer: 38 mL/min — ABNORMAL LOW (ref >90)
Glucose, Bld: 154 mg/dL — ABNORMAL HIGH (ref 70–99)
Potassium: 3.6 mmol/L (ref 3.5–5.1)
Sodium: 139 mmol/L (ref 135–145)

## 2014-08-22 LAB — I-STAT TROPONIN, ED: Troponin i, poc: 0.03 ng/mL (ref 0.00–0.08)

## 2014-08-22 MED ORDER — PREDNISONE 20 MG PO TABS: ORAL_TABLET | ORAL | Status: DC

## 2014-08-22 MED ORDER — AZITHROMYCIN 250 MG PO TABS: ORAL_TABLET | ORAL | Status: DC

## 2014-08-22 MED ORDER — IPRATROPIUM-ALBUTEROL 0.5-2.5 (3) MG/3ML IN SOLN
3.0000 mL | Freq: Once | RESPIRATORY_TRACT | Status: AC
  Administered 2014-08-22: 3 mL via RESPIRATORY_TRACT
  Filled 2014-08-22: qty 3

## 2014-08-22 MED ORDER — ALBUTEROL SULFATE HFA 108 (90 BASE) MCG/ACT IN AERS: 2.0000 | INHALATION_SPRAY | RESPIRATORY_TRACT | Status: DC | PRN

## 2014-08-22 NOTE — ED Notes (Signed)
Pt transported to xray 

## 2014-08-22 NOTE — ED Provider Notes (Signed)
CSN: [id number]     Arrival date & time 08/22/14  1118 History   First MD Initiated Contact with Patient 08/22/14 1202     Chief Complaint  Patient presents with  . Cough  . Shortness of Breath     (Consider location/radiation/quality/duration/timing/severity/associated sxs/prior Treatment) HPI The patient has had cough for about 4 weeks. She reports this started around Christmas. The first several weeks that she had it it was fairly severe. She seemed to be improving last week and she saw her family physician 6 days ago on a routine visit. At that time she didn't have any symptoms to report. She reports however over this week now the cough came back and was more harsh than previously. She reports she has some shortness of breath when she is coughing really hard. She reports she mostly can't bring mucus up but occasionally she brings up some whitish sputum. She reports now she is also started to wheeze. Her daughter was concerned for possible pertussis due to the longevity of her cough and its recurrence. Subjectively the patient thought she had had some fever however they have not been measuring it. Past Medical History  Diagnosis Date  . Hypertension   . Diabetes mellitus   . Glaucoma   . GERD (gastroesophageal reflux disease)   . Tinnitus   . Heart disease    Past Surgical History  Procedure Laterality Date  . Breast lumpectomy      bil for breast ca   History reviewed. No pertinent family history. History  Substance Use Topics  . Smoking status: Former Research scientist (life sciences)  . Smokeless tobacco: Not on file  . Alcohol Use: No     Comment: drinks caffeine daily   OB History    No data available     Review of Systems  10 Systems reviewed and are negative for acute change except as noted in the HPI.   Allergies  Ace inhibitors and Dilaudid  Home Medications   Prior to Admission medications   Medication Sig Start Date End Date Taking? Authorizing Provider  allopurinol (ZYLOPRIM)  100 MG tablet Take 100 mg by mouth 2 (two) times daily.   Yes Historical Provider, MD  amLODipine (NORVASC) 10 MG tablet Take 10 mg by mouth daily.   Yes Historical Provider, MD  aspirin 81 MG tablet Take 81 mg by mouth daily.   Yes Historical Provider, MD  glyBURIDE-metformin (GLUCOVANCE) 2.5-500 MG per tablet Take 2 tablets by mouth 2 (two) times daily with a meal.   Yes Historical Provider, MD  Iron-B12-Vitamins CAPS Take by mouth.   Yes Historical Provider, MD  levETIRAcetam (KEPPRA XR) 500 MG 24 hr tablet TAKE TWO TABLETS BY MOUTH AT BEDTIME   Yes Marcial Pacas, MD  metoprolol succinate (TOPROL-XL) 100 MG 24 hr tablet Take 100 mg by mouth 2 (two) times daily. Take with or immediately following a meal.   Yes Historical Provider, MD  mirtazapine (REMERON) 15 MG tablet Take 15 mg by mouth daily.   Yes Historical Provider, MD  omeprazole (PRILOSEC) 20 MG capsule Take 20 mg by mouth daily.   Yes Historical Provider, MD  valsartan-hydrochlorothiazide (DIOVAN-HCT) 320-12.5 MG per tablet Take 1 tablet by mouth daily.   Yes Historical Provider, MD  albuterol (PROVENTIL HFA;VENTOLIN HFA) 108 (90 BASE) MCG/ACT inhaler Inhale 2 puffs into the lungs every 2 (two) hours as needed for wheezing or shortness of breath (cough). 08/22/14   Charlesetta Shanks, MD  azithromycin (ZITHROMAX Z-PAK) 250 MG tablet 2 po  day one, then 1 daily x 4 days 08/22/14   Charlesetta Shanks, MD  predniSONE (DELTASONE) 20 MG tablet 3 tabs po day one, then 2 tabs daily x 4 days 08/22/14   Charlesetta Shanks, MD   BP 149/57 mmHg  Pulse 86  Temp(Src) 98.9 F (37.2 C) (Oral)  Resp 22  Ht 5\' 1"  (1.549 m)  Wt 179 lb (81.194 kg)  BMI 33.84 kg/m2  SpO2 97%  LMP  (Approximate) Physical Exam  Constitutional: She is oriented to person, place, and time. She appears well-developed and well-nourished.  HENT:  Head: Normocephalic and atraumatic.  Eyes: EOM are normal. Pupils are equal, round, and reactive to light.  Neck: Neck supple.  Cardiovascular:  Normal rate, regular rhythm, normal heart sounds and intact distal pulses.   Pulmonary/Chest: Effort normal. No respiratory distress. She has wheezes. She has no rales.  With deep inspiration the patient has cough paroxysm. She has expiratory wheezes diffusely throughout lung fields. She has adequate air flow to the bases. Are no rales or rhonchi present.  Abdominal: Soft. Bowel sounds are normal. She exhibits no distension. There is no tenderness.  Musculoskeletal: Normal range of motion. She exhibits no edema.  Neurological: She is alert and oriented to person, place, and time. She has normal strength. Coordination normal. GCS eye subscore is 4. GCS verbal subscore is 5. GCS motor subscore is 6.  Skin: Skin is warm, dry and intact.  Psychiatric: She has a normal mood and affect.    ED Course  Procedures (including critical care time) Labs Review Labs Reviewed  BASIC METABOLIC PANEL - Abnormal; Notable for the following:    Glucose, Bld 154 (*)    Creatinine, Ser 1.28 (*)    GFR calc non Af Amer 38 (*)    GFR calc Af Amer 44 (*)    All other components within normal limits  BRAIN NATRIURETIC PEPTIDE - Abnormal; Notable for the following:    B Natriuretic Peptide 113.3 (*)    All other components within normal limits  BORDETELLA PERTUSSIS PCR  CBC WITH DIFFERENTIAL/PLATELET  Randolm Idol, ED    Imaging Review Dg Chest 2 View  08/22/2014   CLINICAL DATA:  Productive cough for 6 weeks. No known fever. History of diabetes, hypertension and heart disease. Initial encounter.  EXAM: CHEST  2 VIEW  COMPARISON:  03/26/2011  FINDINGS: The heart size and mediastinal contours are stable. There is atherosclerosis of the thoracic aorta. The lungs appear unchanged. Mild central airway thickening is noted without edema or confluent airspace opacity. There is no pleural effusion or pneumothorax. Postsurgical changes are present within the left breast and left axilla. The bones appear unchanged.   IMPRESSION: Stable examination status post left lumpectomy and axillary node dissection. No evidence of pneumonia.   Electronically Signed   By: Camie Patience M.D.   On: 08/22/2014 13:11     EKG Interpretation None      MDM   Final diagnoses:  Bronchitis with bronchospasm   The patient's general appearance is good. She is nontoxic in appearance and does not have respiratory distress at rest. She does have significant wheezing that improved dramatically with DuoNeb administration. She has a very distant smoking history and does not appear to have any COPD. At this point symptoms are most suggestive of persisting acute bronchitis. There was description of cough peroxisomal's and with the persistence of the symptoms a Bordetella pertussis has been obtained. The patient will be treated with Zithromax, prednisone and albuterol.  She is to follow-up with her family physician this week for recheck.    Charlesetta Shanks, MD 08/22/14 [phone]

## 2014-08-22 NOTE — Discharge Instructions (Signed)

## 2014-08-22 NOTE — ED Notes (Signed)
Pt reports productive cough with white colored sputum x6 weeks. States coughing became worse today. Pt states "I feel like something is stuck in my throat." respirations unlabored. Lung sounds clear and equal bilaterally. Pt is alert and oriented x4. NAD.

## 2014-08-22 NOTE — ED Notes (Signed)
Pt reports having cough x 6 weeks, productive. Denies fever but is having sob and fatigue.

## 2014-08-23 ENCOUNTER — Other Ambulatory Visit: Payer: Medicare Other

## 2014-08-23 DIAGNOSIS — J40 Bronchitis, not specified as acute or chronic: Secondary | ICD-10-CM | POA: Diagnosis not present

## 2014-08-25 LAB — BORDETELLA PERTUSSIS PCR
B parapertussis, DNA: NOT DETECTED
B pertussis, DNA: NOT DETECTED

## 2014-08-31 ENCOUNTER — Ambulatory Visit
Admission: RE | Admit: 2014-08-31 | Discharge: 2014-08-31 | Disposition: A | Discharge status: Home / Self Care | Payer: Medicare Other | Source: Ambulatory Visit | Attending: Nurse Practitioner | Admitting: Nurse Practitioner

## 2014-08-31 ENCOUNTER — Other Ambulatory Visit: Payer: Self-pay | Admitting: Nurse Practitioner

## 2014-08-31 DIAGNOSIS — J209 Acute bronchitis, unspecified: Secondary | ICD-10-CM | POA: Diagnosis not present

## 2014-08-31 DIAGNOSIS — B370 Candidal stomatitis: Secondary | ICD-10-CM | POA: Diagnosis not present

## 2014-08-31 DIAGNOSIS — J40 Bronchitis, not specified as acute or chronic: Secondary | ICD-10-CM

## 2014-08-31 DIAGNOSIS — R0602 Shortness of breath: Secondary | ICD-10-CM | POA: Diagnosis not present

## 2014-09-13 DIAGNOSIS — R05 Cough: Secondary | ICD-10-CM | POA: Diagnosis not present

## 2014-09-13 DIAGNOSIS — E538 Deficiency of other specified B group vitamins: Secondary | ICD-10-CM | POA: Diagnosis not present

## 2014-09-13 DIAGNOSIS — R5383 Other fatigue: Secondary | ICD-10-CM | POA: Diagnosis not present

## 2014-10-28 ENCOUNTER — Ambulatory Visit
Admission: RE | Admit: 2014-10-28 | Discharge: 2014-10-28 | Disposition: A | Discharge status: Home / Self Care | Payer: Medicare Other | Source: Ambulatory Visit | Attending: Internal Medicine | Admitting: Internal Medicine

## 2014-10-28 DIAGNOSIS — E042 Nontoxic multinodular goiter: Secondary | ICD-10-CM | POA: Diagnosis not present

## 2014-10-28 DIAGNOSIS — E041 Nontoxic single thyroid nodule: Secondary | ICD-10-CM

## 2014-12-24 DIAGNOSIS — E041 Nontoxic single thyroid nodule: Secondary | ICD-10-CM | POA: Diagnosis not present

## 2014-12-24 DIAGNOSIS — G40909 Epilepsy, unspecified, not intractable, without status epilepticus: Secondary | ICD-10-CM | POA: Diagnosis not present

## 2014-12-24 DIAGNOSIS — C50919 Malignant neoplasm of unspecified site of unspecified female breast: Secondary | ICD-10-CM | POA: Diagnosis not present

## 2014-12-24 DIAGNOSIS — N182 Chronic kidney disease, stage 2 (mild): Secondary | ICD-10-CM | POA: Diagnosis not present

## 2014-12-24 DIAGNOSIS — Z1389 Encounter for screening for other disorder: Secondary | ICD-10-CM | POA: Diagnosis not present

## 2014-12-24 DIAGNOSIS — I10 Essential (primary) hypertension: Secondary | ICD-10-CM | POA: Diagnosis not present

## 2014-12-24 DIAGNOSIS — E663 Overweight: Secondary | ICD-10-CM | POA: Diagnosis not present

## 2014-12-24 DIAGNOSIS — E1122 Type 2 diabetes mellitus with diabetic chronic kidney disease: Secondary | ICD-10-CM | POA: Diagnosis not present

## 2014-12-24 DIAGNOSIS — E785 Hyperlipidemia, unspecified: Secondary | ICD-10-CM | POA: Diagnosis not present

## 2014-12-24 DIAGNOSIS — Z0001 Encounter for general adult medical examination with abnormal findings: Secondary | ICD-10-CM | POA: Diagnosis not present

## 2014-12-24 DIAGNOSIS — Z6832 Body mass index (BMI) 32.0-32.9, adult: Secondary | ICD-10-CM | POA: Diagnosis not present

## 2014-12-28 ENCOUNTER — Other Ambulatory Visit: Payer: Self-pay | Admitting: Internal Medicine

## 2014-12-28 DIAGNOSIS — E041 Nontoxic single thyroid nodule: Secondary | ICD-10-CM

## 2014-12-29 ENCOUNTER — Other Ambulatory Visit: Payer: Self-pay | Admitting: Internal Medicine

## 2014-12-29 DIAGNOSIS — E041 Nontoxic single thyroid nodule: Secondary | ICD-10-CM

## 2015-01-20 ENCOUNTER — Other Ambulatory Visit: Payer: Medicare Other

## 2015-02-03 ENCOUNTER — Ambulatory Visit
Admission: RE | Admit: 2015-02-03 | Discharge: 2015-02-03 | Disposition: A | Discharge status: Home / Self Care | Payer: Medicare Other | Source: Ambulatory Visit | Attending: Internal Medicine | Admitting: Internal Medicine

## 2015-02-03 ENCOUNTER — Other Ambulatory Visit: Payer: Self-pay | Admitting: Internal Medicine

## 2015-02-03 DIAGNOSIS — E041 Nontoxic single thyroid nodule: Secondary | ICD-10-CM

## 2015-03-11 DIAGNOSIS — R300 Dysuria: Secondary | ICD-10-CM | POA: Diagnosis not present

## 2015-05-04 DIAGNOSIS — M25561 Pain in right knee: Secondary | ICD-10-CM | POA: Diagnosis not present

## 2015-05-04 DIAGNOSIS — M545 Low back pain: Secondary | ICD-10-CM | POA: Diagnosis not present

## 2015-05-04 DIAGNOSIS — R200 Anesthesia of skin: Secondary | ICD-10-CM | POA: Diagnosis not present

## 2015-05-15 DIAGNOSIS — Z23 Encounter for immunization: Secondary | ICD-10-CM | POA: Diagnosis not present

## 2016-01-13 DIAGNOSIS — R109 Unspecified abdominal pain: Secondary | ICD-10-CM | POA: Diagnosis not present

## 2016-01-13 DIAGNOSIS — N182 Chronic kidney disease, stage 2 (mild): Secondary | ICD-10-CM | POA: Diagnosis not present

## 2016-01-13 DIAGNOSIS — I10 Essential (primary) hypertension: Secondary | ICD-10-CM | POA: Diagnosis not present

## 2016-01-13 DIAGNOSIS — Z7984 Long term (current) use of oral hypoglycemic drugs: Secondary | ICD-10-CM | POA: Diagnosis not present

## 2016-01-13 DIAGNOSIS — E7800 Pure hypercholesterolemia, unspecified: Secondary | ICD-10-CM | POA: Diagnosis not present

## 2016-01-13 DIAGNOSIS — E1122 Type 2 diabetes mellitus with diabetic chronic kidney disease: Secondary | ICD-10-CM | POA: Diagnosis not present

## 2016-01-13 DIAGNOSIS — R5383 Other fatigue: Secondary | ICD-10-CM | POA: Diagnosis not present

## 2016-01-20 DIAGNOSIS — I10 Essential (primary) hypertension: Secondary | ICD-10-CM | POA: Diagnosis not present

## 2016-01-20 DIAGNOSIS — Z1389 Encounter for screening for other disorder: Secondary | ICD-10-CM | POA: Diagnosis not present

## 2016-01-20 DIAGNOSIS — N182 Chronic kidney disease, stage 2 (mild): Secondary | ICD-10-CM | POA: Diagnosis not present

## 2016-01-20 DIAGNOSIS — G40909 Epilepsy, unspecified, not intractable, without status epilepticus: Secondary | ICD-10-CM | POA: Diagnosis not present

## 2016-01-20 DIAGNOSIS — E663 Overweight: Secondary | ICD-10-CM | POA: Diagnosis not present

## 2016-01-20 DIAGNOSIS — Z6831 Body mass index (BMI) 31.0-31.9, adult: Secondary | ICD-10-CM | POA: Diagnosis not present

## 2016-01-20 DIAGNOSIS — E7800 Pure hypercholesterolemia, unspecified: Secondary | ICD-10-CM | POA: Diagnosis not present

## 2016-01-20 DIAGNOSIS — Z0000 Encounter for general adult medical examination without abnormal findings: Secondary | ICD-10-CM | POA: Diagnosis not present

## 2016-01-20 DIAGNOSIS — C50919 Malignant neoplasm of unspecified site of unspecified female breast: Secondary | ICD-10-CM | POA: Diagnosis not present

## 2016-01-20 DIAGNOSIS — Z7984 Long term (current) use of oral hypoglycemic drugs: Secondary | ICD-10-CM | POA: Diagnosis not present

## 2016-01-20 DIAGNOSIS — N390 Urinary tract infection, site not specified: Secondary | ICD-10-CM | POA: Diagnosis not present

## 2016-01-20 DIAGNOSIS — E1122 Type 2 diabetes mellitus with diabetic chronic kidney disease: Secondary | ICD-10-CM | POA: Diagnosis not present

## 2016-01-25 DIAGNOSIS — N390 Urinary tract infection, site not specified: Secondary | ICD-10-CM | POA: Diagnosis not present

## 2016-01-25 DIAGNOSIS — Z1211 Encounter for screening for malignant neoplasm of colon: Secondary | ICD-10-CM | POA: Diagnosis not present

## 2016-05-04 DIAGNOSIS — Z6832 Body mass index (BMI) 32.0-32.9, adult: Secondary | ICD-10-CM | POA: Diagnosis not present

## 2016-05-04 DIAGNOSIS — E663 Overweight: Secondary | ICD-10-CM | POA: Diagnosis not present

## 2016-05-04 DIAGNOSIS — I10 Essential (primary) hypertension: Secondary | ICD-10-CM | POA: Diagnosis not present

## 2016-05-04 DIAGNOSIS — E7800 Pure hypercholesterolemia, unspecified: Secondary | ICD-10-CM | POA: Diagnosis not present

## 2016-05-04 DIAGNOSIS — C50919 Malignant neoplasm of unspecified site of unspecified female breast: Secondary | ICD-10-CM | POA: Diagnosis not present

## 2016-05-04 DIAGNOSIS — G40909 Epilepsy, unspecified, not intractable, without status epilepticus: Secondary | ICD-10-CM | POA: Diagnosis not present

## 2016-05-04 DIAGNOSIS — Z23 Encounter for immunization: Secondary | ICD-10-CM | POA: Diagnosis not present

## 2016-05-04 DIAGNOSIS — E1122 Type 2 diabetes mellitus with diabetic chronic kidney disease: Secondary | ICD-10-CM | POA: Diagnosis not present

## 2016-05-04 DIAGNOSIS — N182 Chronic kidney disease, stage 2 (mild): Secondary | ICD-10-CM | POA: Diagnosis not present

## 2016-08-13 ENCOUNTER — Other Ambulatory Visit: Payer: Self-pay | Admitting: Internal Medicine

## 2016-08-13 ENCOUNTER — Ambulatory Visit
Admission: RE | Admit: 2016-08-13 | Discharge: 2016-08-13 | Disposition: A | Discharge status: Home / Self Care | Payer: Medicare Other | Source: Ambulatory Visit | Attending: Internal Medicine | Admitting: Internal Medicine

## 2016-08-13 DIAGNOSIS — M19042 Primary osteoarthritis, left hand: Secondary | ICD-10-CM | POA: Diagnosis not present

## 2016-08-13 DIAGNOSIS — M79642 Pain in left hand: Secondary | ICD-10-CM

## 2016-08-13 DIAGNOSIS — M19041 Primary osteoarthritis, right hand: Secondary | ICD-10-CM | POA: Diagnosis not present

## 2016-08-13 DIAGNOSIS — M79641 Pain in right hand: Secondary | ICD-10-CM

## 2016-08-13 DIAGNOSIS — M79643 Pain in unspecified hand: Secondary | ICD-10-CM | POA: Diagnosis not present

## 2016-11-12 DIAGNOSIS — Z1231 Encounter for screening mammogram for malignant neoplasm of breast: Secondary | ICD-10-CM | POA: Diagnosis not present

## 2016-11-12 DIAGNOSIS — M79642 Pain in left hand: Secondary | ICD-10-CM | POA: Diagnosis not present

## 2016-11-12 DIAGNOSIS — E1122 Type 2 diabetes mellitus with diabetic chronic kidney disease: Secondary | ICD-10-CM | POA: Diagnosis not present

## 2016-11-12 DIAGNOSIS — E1165 Type 2 diabetes mellitus with hyperglycemia: Secondary | ICD-10-CM | POA: Diagnosis not present

## 2016-11-12 DIAGNOSIS — E7800 Pure hypercholesterolemia, unspecified: Secondary | ICD-10-CM | POA: Diagnosis not present

## 2016-11-12 DIAGNOSIS — I10 Essential (primary) hypertension: Secondary | ICD-10-CM | POA: Diagnosis not present

## 2016-11-12 DIAGNOSIS — N182 Chronic kidney disease, stage 2 (mild): Secondary | ICD-10-CM | POA: Diagnosis not present

## 2016-11-12 DIAGNOSIS — M79641 Pain in right hand: Secondary | ICD-10-CM | POA: Diagnosis not present

## 2016-11-12 DIAGNOSIS — Z7984 Long term (current) use of oral hypoglycemic drugs: Secondary | ICD-10-CM | POA: Diagnosis not present

## 2017-03-08 DIAGNOSIS — E7800 Pure hypercholesterolemia, unspecified: Secondary | ICD-10-CM | POA: Diagnosis not present

## 2017-03-08 DIAGNOSIS — Z1389 Encounter for screening for other disorder: Secondary | ICD-10-CM | POA: Diagnosis not present

## 2017-03-08 DIAGNOSIS — E1122 Type 2 diabetes mellitus with diabetic chronic kidney disease: Secondary | ICD-10-CM | POA: Diagnosis not present

## 2017-03-08 DIAGNOSIS — I10 Essential (primary) hypertension: Secondary | ICD-10-CM | POA: Diagnosis not present

## 2017-03-08 DIAGNOSIS — N182 Chronic kidney disease, stage 2 (mild): Secondary | ICD-10-CM | POA: Diagnosis not present

## 2017-03-08 DIAGNOSIS — Z0000 Encounter for general adult medical examination without abnormal findings: Secondary | ICD-10-CM | POA: Diagnosis not present

## 2017-06-03 DIAGNOSIS — Z23 Encounter for immunization: Secondary | ICD-10-CM | POA: Diagnosis not present

## 2017-06-03 DIAGNOSIS — R300 Dysuria: Secondary | ICD-10-CM | POA: Diagnosis not present

## 2017-06-03 DIAGNOSIS — M2550 Pain in unspecified joint: Secondary | ICD-10-CM | POA: Diagnosis not present

## 2017-07-26 DIAGNOSIS — R300 Dysuria: Secondary | ICD-10-CM | POA: Diagnosis not present

## 2017-07-26 DIAGNOSIS — I10 Essential (primary) hypertension: Secondary | ICD-10-CM | POA: Diagnosis not present

## 2017-07-26 DIAGNOSIS — Z7984 Long term (current) use of oral hypoglycemic drugs: Secondary | ICD-10-CM | POA: Diagnosis not present

## 2017-07-26 DIAGNOSIS — E1122 Type 2 diabetes mellitus with diabetic chronic kidney disease: Secondary | ICD-10-CM | POA: Diagnosis not present

## 2017-07-29 ENCOUNTER — Other Ambulatory Visit: Payer: Self-pay

## 2017-07-29 ENCOUNTER — Emergency Department (HOSPITAL_COMMUNITY): Payer: Medicare Other

## 2017-07-29 ENCOUNTER — Emergency Department (HOSPITAL_COMMUNITY)
Admission: EM | Admit: 2017-07-29 | Discharge: 2017-07-30 | Disposition: A | Discharge status: Home / Self Care | Payer: Medicare Other | Attending: Emergency Medicine | Admitting: Emergency Medicine

## 2017-07-29 ENCOUNTER — Encounter (HOSPITAL_COMMUNITY): Payer: Self-pay | Admitting: *Deleted

## 2017-07-29 DIAGNOSIS — Z7984 Long term (current) use of oral hypoglycemic drugs: Secondary | ICD-10-CM | POA: Diagnosis not present

## 2017-07-29 DIAGNOSIS — Z87891 Personal history of nicotine dependence: Secondary | ICD-10-CM | POA: Insufficient documentation

## 2017-07-29 DIAGNOSIS — Z91040 Latex allergy status: Secondary | ICD-10-CM | POA: Insufficient documentation

## 2017-07-29 DIAGNOSIS — Z79899 Other long term (current) drug therapy: Secondary | ICD-10-CM | POA: Diagnosis not present

## 2017-07-29 DIAGNOSIS — Z7982 Long term (current) use of aspirin: Secondary | ICD-10-CM | POA: Insufficient documentation

## 2017-07-29 DIAGNOSIS — E119 Type 2 diabetes mellitus without complications: Secondary | ICD-10-CM | POA: Diagnosis not present

## 2017-07-29 DIAGNOSIS — R2689 Other abnormalities of gait and mobility: Secondary | ICD-10-CM | POA: Diagnosis not present

## 2017-07-29 DIAGNOSIS — E1122 Type 2 diabetes mellitus with diabetic chronic kidney disease: Secondary | ICD-10-CM | POA: Diagnosis not present

## 2017-07-29 DIAGNOSIS — R531 Weakness: Secondary | ICD-10-CM | POA: Diagnosis not present

## 2017-07-29 DIAGNOSIS — M6281 Muscle weakness (generalized): Secondary | ICD-10-CM | POA: Insufficient documentation

## 2017-07-29 DIAGNOSIS — I10 Essential (primary) hypertension: Secondary | ICD-10-CM | POA: Diagnosis not present

## 2017-07-29 LAB — URINALYSIS, ROUTINE W REFLEX MICROSCOPIC
Bilirubin Urine: NEGATIVE
Glucose, UA: NEGATIVE mg/dL
Hgb urine dipstick: NEGATIVE
Ketones, ur: NEGATIVE mg/dL
Nitrite: NEGATIVE
Protein, ur: 100 mg/dL — AB
Specific Gravity, Urine: 1.019 (ref 1.005–1.030)
pH: 5 (ref 5.0–8.0)

## 2017-07-29 LAB — COMPREHENSIVE METABOLIC PANEL
ALT: 19 U/L (ref 14–54)
AST: 24 U/L (ref 15–41)
Albumin: 3.3 g/dL — ABNORMAL LOW (ref 3.5–5.0)
Alkaline Phosphatase: 225 U/L — ABNORMAL HIGH (ref 38–126)
Anion gap: 11 (ref 5–15)
BUN: 27 mg/dL — ABNORMAL HIGH (ref 6–20)
CO2: 27 mmol/L (ref 22–32)
Calcium: 9.4 mg/dL (ref 8.9–10.3)
Chloride: 98 mmol/L — ABNORMAL LOW (ref 101–111)
Creatinine, Ser: 1.57 mg/dL — ABNORMAL HIGH (ref 0.44–1.00)
GFR calc Af Amer: 33 mL/min — ABNORMAL LOW (ref >60)
GFR calc non Af Amer: 29 mL/min — ABNORMAL LOW (ref >60)
Glucose, Bld: 179 mg/dL — ABNORMAL HIGH (ref 65–99)
Potassium: 3.7 mmol/L (ref 3.5–5.1)
Sodium: 136 mmol/L (ref 135–145)
Total Bilirubin: 0.4 mg/dL (ref 0.3–1.2)
Total Protein: 7.9 g/dL (ref 6.5–8.1)

## 2017-07-29 LAB — CBC
HCT: 41.5 % (ref 36.0–46.0)
Hemoglobin: 13.7 g/dL (ref 12.0–15.0)
MCH: 28.5 pg (ref 26.0–34.0)
MCHC: 33 g/dL (ref 30.0–36.0)
MCV: 86.3 fL (ref 78.0–100.0)
Platelets: 250 10*3/uL (ref 150–400)
RBC: 4.81 MIL/uL (ref 3.87–5.11)
RDW: 13.5 % (ref 11.5–15.5)
WBC: 5.6 10*3/uL (ref 4.0–10.5)

## 2017-07-29 LAB — I-STAT TROPONIN, ED: Troponin i, poc: 0.01 ng/mL (ref 0.00–0.08)

## 2017-07-29 LAB — CBG MONITORING, ED: Glucose-Capillary: 235 mg/dL — ABNORMAL HIGH (ref 65–99)

## 2017-07-29 MED ORDER — SODIUM CHLORIDE 0.9 % IV BOLUS (SEPSIS)
1000.0000 mL | Freq: Once | INTRAVENOUS | Status: AC
  Administered 2017-07-29: 1000 mL via INTRAVENOUS

## 2017-07-29 NOTE — ED Provider Notes (Signed)
[facility] EMERGENCY DEPARTMENT Provider Note   CSN: [id number] Arrival date & time: 07/29/17  1701     History   Chief Complaint Chief Complaint  Patient presents with  . Hypertension  . Hyperglycemia  . Altered Mental Status    HPI [name] is a 83 y.o. female.  HPI  83 year old female with diabetes and hypertension presents with her daughter for weakness.  Patient has not been feeling well starting about 3 or 4 days ago.  She was lethargic and not quite acting herself.  Was not really confused.  Had an episode of vomiting multiple times about 3 days ago.  Yesterday she seemed dizzy and off balance but today that is better.  She has had fatigue and not had her usual energy.  Patient states she has been having on and off chest pain for months but this is not changed from normal.  She has had a headache for several days but no fevers.  No facial droop or slurred speech.  No focal weakness noted by family.  Went to a few days ago and her blood pressure was very high and her glucose was 300.  Blood pressure was 782 systolic.  That is when they found out that the patient had decided to stop taking her medicines for a while.  She is now back on her medicines and her blood pressure has improved.  Today the patient is actually doing somewhat better but because she was off balance, her doctor sent her and for workup of stroke/TIA.  Patient has had a headache that is mild for the last several days.  Patient tells me right now that she feels fine.  Past Medical History:  Diagnosis Date  . Diabetes mellitus   . GERD (gastroesophageal reflux disease)   . Glaucoma   . Heart disease   . Hypertension   . Tinnitus     There are no active problems to display for this patient.   Past Surgical History:  Procedure Laterality Date  . BREAST LUMPECTOMY     bil for breast ca    OB History    No data available       Home Medications    Prior to Admission medications    Medication Sig Start Date End Date Taking? Authorizing Provider  allopurinol (ZYLOPRIM) 100 MG tablet Take 100 mg by mouth 2 (two) times daily.   Yes [provider]  amLODipine (NORVASC) 10 MG tablet Take 10 mg by mouth daily.   Yes [provider]  aspirin 81 MG tablet Take 81 mg by mouth daily.   Yes [provider]  Cyanocobalamin (VITAMIN B-12 PO) Take 1 tablet by mouth at bedtime.   Yes [provider]  glyBURIDE-metformin (GLUCOVANCE) 2.5-500 MG per tablet Take 2 tablets by mouth 2 (two) times daily with a meal.   Yes [provider]  levETIRAcetam (KEPPRA XR) 500 MG 24 hr tablet TAKE TWO TABLETS BY MOUTH AT BEDTIME Patient taking differently: Take 1,000 mg by mouth in the morning   Yes Marcial Pacas, MD  meloxicam (MOBIC) 15 MG tablet Take 15 mg by mouth daily as needed (for arthritic pain).   Yes [provider]  metoprolol succinate (TOPROL-XL) 100 MG 24 hr tablet Take 100 mg by mouth 2 (two) times daily. Take with or immediately following a meal.   Yes [provider]  mirtazapine (REMERON) 15 MG tablet Take 15 mg by mouth at bedtime.  Yes [provider]  omeprazole (PRILOSEC) 20 MG capsule Take 20 mg by mouth daily.   Yes [provider]  valsartan-hydrochlorothiazide (DIOVAN-HCT) 320-12.5 MG per tablet Take 1 tablet by mouth daily.   Yes [provider]  albuterol (PROVENTIL HFA;VENTOLIN HFA) 108 (90 BASE) MCG/ACT inhaler Inhale 2 puffs into the lungs every 2 (two) hours as needed for wheezing or shortness of breath (cough). Patient not taking: Reported on 07/29/2017 08/22/14   Charlesetta Shanks, MD  azithromycin (ZITHROMAX Z-PAK) 250 MG tablet 2 po day one, then 1 daily x 4 days Patient not taking: Reported on 07/29/2017 08/22/14   Charlesetta Shanks, MD  predniSONE (DELTASONE) 20 MG tablet 3 tabs po day one, then 2 tabs daily x 4 days Patient not taking: Reported on 07/29/2017 08/22/14   Charlesetta Shanks, MD    Family History History reviewed. No pertinent family history.  Social History Social History   Tobacco Use  . Smoking status: Former Smoker  Substance Use Topics  . Alcohol use: No    Comment: drinks caffeine daily  . Drug use: No     Allergies   Ace inhibitors; Dilaudid [hydromorphone hcl]; Latex; and Tape   Review of Systems Review of Systems  Constitutional: Positive for fatigue. Negative for fever.  Respiratory: Negative for shortness of breath.   Cardiovascular: Positive for chest pain.  Gastrointestinal: Positive for vomiting. Negative for abdominal pain.  Genitourinary: Negative for dysuria.  Neurological: Positive for weakness and headaches. Negative for numbness.  Psychiatric/Behavioral: Negative for confusion.  All other systems reviewed and are negative.    Physical Exam Updated Vital Signs BP (!) 155/70 (BP Location: Right Arm)   Pulse 67   Temp 97.9 F (36.6 C)   Resp (!) 22   SpO2 95%   Physical Exam  Constitutional: She is oriented to person, place, and time. She appears well-developed and well-nourished. No distress.  HENT:  Head: Normocephalic and atraumatic.  Right Ear: External ear normal.  Left Ear: External ear normal.  Nose: Nose normal.  Eyes: EOM are normal. Pupils are equal, round, and reactive to light. Right eye exhibits no discharge. Left eye exhibits no discharge.  Neck: Neck supple.  Cardiovascular: Normal rate, regular rhythm and normal heart sounds.  Pulmonary/Chest: Effort normal and breath sounds normal.  Abdominal: Soft. There is no tenderness.  Neurological: She is alert and oriented to person, place, and time.  CN 3-12 grossly intact. 5/5 strength in all 4 extremities. Grossly normal sensation. Normal finger to nose. Normal ambulation with a cane  Skin: Skin is warm and dry. She is not diaphoretic.  Nursing note and vitals reviewed.    ED Treatments / Results  Labs (all labs ordered are listed, but only  abnormal results are displayed) Labs Reviewed  COMPREHENSIVE METABOLIC PANEL - Abnormal; Notable for the following components:      Result Value   Chloride 98 (*)    Glucose, Bld 179 (*)    BUN 27 (*)    Creatinine, Ser 1.57 (*)    Albumin 3.3 (*)    Alkaline Phosphatase 225 (*)    GFR calc non Af Amer 29 (*)    GFR calc Af Amer 33 (*)    All other components within normal limits  URINALYSIS, ROUTINE W REFLEX MICROSCOPIC - Abnormal; Notable for the following components:   APPearance HAZY (*)    Protein, ur 100 (*)    Leukocytes, UA SMALL (*)    Bacteria, UA RARE (*)  Squamous Epithelial / LPF 0-5 (*)    All other components within normal limits  CBG MONITORING, ED - Abnormal; Notable for the following components:   Glucose-Capillary 235 (*)    All other components within normal limits  CBC  I-STAT TROPONIN, ED    EKG  EKG Interpretation  Date/Time:  Monday July 29 2017 17:55:46 EST Ventricular Rate:  66 PR Interval:  160 QRS Duration: 138 QT Interval:  456 QTC Calculation: 478 R Axis:   -77 Text Interpretation:  Normal sinus rhythm Right bundle branch block Left anterior fascicular block * Bifascicular block * Possible Lateral infarct , age undetermined Abnormal ECG no significant change since 2013 Confirmed by Sherwood Gambler [id number]) on 07/29/2017 10:40:03 PM       Radiology Ct Head Wo Contrast  Result Date: 07/29/2017 CLINICAL DATA:  Lethargy, weakness and unsteady gait. History of breast cancer. EXAM: CT HEAD WITHOUT CONTRAST TECHNIQUE: Contiguous axial images were obtained from the base of the skull through the vertex without intravenous contrast. COMPARISON:  05/02/2005 MRI. FINDINGS: Brain: No evidence of acute large vascular territory infarction, hemorrhage, hydrocephalus, extra-axial collection or mass lesion/mass effect. Small vessel ischemic changes are noted of the periventricular white matter. Findings are likely chronic given moderate atherosclerosis  at the skullbase. Vascular: No hyperdense vessel or unexpected calcification. Skull: Hyperostosis frontalis of the skull. No suspicious osteoblastic or lytic lesions. Sinuses/Orbits: No acute finding. Other: None IMPRESSION: Chronic appearing small vessel ischemic disease. No acute intracranial abnormality. Electronically Signed   By: Ashley Royalty M.D.   On: 07/29/2017 23:35    Procedures Procedures (including critical care time)  Medications Ordered in ED Medications  sodium chloride 0.9 % bolus 1,000 mL (0 mLs Intravenous Stopped 07/29/17 2348)     Initial Impression / Assessment and Plan / ED Course  I have reviewed the triage vital signs and the nursing notes.  Pertinent labs & imaging results that were available during my care of the patient were reviewed by me and considered in my medical decision making (see chart for details).    No clear etiology for the patient's multiple complaints over the last several days.  However I do not think this was a TIA or stroke given prolonged length of symptoms.  She never seem to have focal neurologic deficits.  She was never confused.  Appears to be more generalized weakness.  Probably this is from the very elevated blood pressure she had a few days ago and then restarting her meds.  However her blood pressure is much better now and possibly she is feeling weak from dramatic decrease in blood pressure.  She was given some IV fluids given mild bump in creatinine.  Encourage increased fluid intake.  No clear infectious signs or symptoms and given benign exam here I think she is stable for discharge home for outpatient follow-up with PCP.  Return precautions.  Final Clinical Impressions(s) / ED Diagnoses   Final diagnoses:  Generalized weakness    ED Discharge Orders    None       Sherwood Gambler, MD 07/30/17 0021

## 2017-07-29 NOTE — ED Notes (Signed)
CBG 235 

## 2017-07-29 NOTE — ED Triage Notes (Signed)
Pt and family reports being sent here due to recently being noncompliant with her meds, unsure of how long this was occuring. Went to pcp on 12/28 and SBP was 230 and cbg > 300. Pt went home and family supervised that she got her meds as prescribed. Pt had no improvement and sent here for further eval. BP is 155/70 at triage. Family states that pt is not at baseline, is lethargic and weak, unsteady gait.

## 2017-08-07 DIAGNOSIS — H409 Unspecified glaucoma: Secondary | ICD-10-CM | POA: Diagnosis not present

## 2017-08-07 DIAGNOSIS — E1122 Type 2 diabetes mellitus with diabetic chronic kidney disease: Secondary | ICD-10-CM | POA: Diagnosis not present

## 2017-08-07 DIAGNOSIS — N182 Chronic kidney disease, stage 2 (mild): Secondary | ICD-10-CM | POA: Diagnosis not present

## 2017-08-07 DIAGNOSIS — R300 Dysuria: Secondary | ICD-10-CM | POA: Diagnosis not present

## 2017-08-07 DIAGNOSIS — I129 Hypertensive chronic kidney disease with stage 1 through stage 4 chronic kidney disease, or unspecified chronic kidney disease: Secondary | ICD-10-CM | POA: Diagnosis not present

## 2017-08-07 DIAGNOSIS — G40909 Epilepsy, unspecified, not intractable, without status epilepticus: Secondary | ICD-10-CM | POA: Diagnosis not present

## 2017-08-08 DIAGNOSIS — G40909 Epilepsy, unspecified, not intractable, without status epilepticus: Secondary | ICD-10-CM | POA: Diagnosis not present

## 2017-08-08 DIAGNOSIS — N182 Chronic kidney disease, stage 2 (mild): Secondary | ICD-10-CM | POA: Diagnosis not present

## 2017-08-08 DIAGNOSIS — I129 Hypertensive chronic kidney disease with stage 1 through stage 4 chronic kidney disease, or unspecified chronic kidney disease: Secondary | ICD-10-CM | POA: Diagnosis not present

## 2017-08-08 DIAGNOSIS — H409 Unspecified glaucoma: Secondary | ICD-10-CM | POA: Diagnosis not present

## 2017-08-08 DIAGNOSIS — E1122 Type 2 diabetes mellitus with diabetic chronic kidney disease: Secondary | ICD-10-CM | POA: Diagnosis not present

## 2017-08-08 DIAGNOSIS — R300 Dysuria: Secondary | ICD-10-CM | POA: Diagnosis not present

## 2017-08-09 DIAGNOSIS — N182 Chronic kidney disease, stage 2 (mild): Secondary | ICD-10-CM | POA: Diagnosis not present

## 2017-08-09 DIAGNOSIS — R300 Dysuria: Secondary | ICD-10-CM | POA: Diagnosis not present

## 2017-08-09 DIAGNOSIS — I129 Hypertensive chronic kidney disease with stage 1 through stage 4 chronic kidney disease, or unspecified chronic kidney disease: Secondary | ICD-10-CM | POA: Diagnosis not present

## 2017-08-09 DIAGNOSIS — E1122 Type 2 diabetes mellitus with diabetic chronic kidney disease: Secondary | ICD-10-CM | POA: Diagnosis not present

## 2017-08-09 DIAGNOSIS — H409 Unspecified glaucoma: Secondary | ICD-10-CM | POA: Diagnosis not present

## 2017-08-09 DIAGNOSIS — G40909 Epilepsy, unspecified, not intractable, without status epilepticus: Secondary | ICD-10-CM | POA: Diagnosis not present

## 2017-08-13 DIAGNOSIS — I129 Hypertensive chronic kidney disease with stage 1 through stage 4 chronic kidney disease, or unspecified chronic kidney disease: Secondary | ICD-10-CM | POA: Diagnosis not present

## 2017-08-13 DIAGNOSIS — H409 Unspecified glaucoma: Secondary | ICD-10-CM | POA: Diagnosis not present

## 2017-08-13 DIAGNOSIS — G40909 Epilepsy, unspecified, not intractable, without status epilepticus: Secondary | ICD-10-CM | POA: Diagnosis not present

## 2017-08-13 DIAGNOSIS — N182 Chronic kidney disease, stage 2 (mild): Secondary | ICD-10-CM | POA: Diagnosis not present

## 2017-08-13 DIAGNOSIS — E1122 Type 2 diabetes mellitus with diabetic chronic kidney disease: Secondary | ICD-10-CM | POA: Diagnosis not present

## 2017-08-13 DIAGNOSIS — R300 Dysuria: Secondary | ICD-10-CM | POA: Diagnosis not present

## 2017-08-15 DIAGNOSIS — H409 Unspecified glaucoma: Secondary | ICD-10-CM | POA: Diagnosis not present

## 2017-08-15 DIAGNOSIS — E1122 Type 2 diabetes mellitus with diabetic chronic kidney disease: Secondary | ICD-10-CM | POA: Diagnosis not present

## 2017-08-15 DIAGNOSIS — I129 Hypertensive chronic kidney disease with stage 1 through stage 4 chronic kidney disease, or unspecified chronic kidney disease: Secondary | ICD-10-CM | POA: Diagnosis not present

## 2017-08-15 DIAGNOSIS — G40909 Epilepsy, unspecified, not intractable, without status epilepticus: Secondary | ICD-10-CM | POA: Diagnosis not present

## 2017-08-15 DIAGNOSIS — R300 Dysuria: Secondary | ICD-10-CM | POA: Diagnosis not present

## 2017-08-15 DIAGNOSIS — N182 Chronic kidney disease, stage 2 (mild): Secondary | ICD-10-CM | POA: Diagnosis not present

## 2017-08-16 DIAGNOSIS — R300 Dysuria: Secondary | ICD-10-CM | POA: Diagnosis not present

## 2017-08-16 DIAGNOSIS — G40909 Epilepsy, unspecified, not intractable, without status epilepticus: Secondary | ICD-10-CM | POA: Diagnosis not present

## 2017-08-16 DIAGNOSIS — E1122 Type 2 diabetes mellitus with diabetic chronic kidney disease: Secondary | ICD-10-CM | POA: Diagnosis not present

## 2017-08-16 DIAGNOSIS — I129 Hypertensive chronic kidney disease with stage 1 through stage 4 chronic kidney disease, or unspecified chronic kidney disease: Secondary | ICD-10-CM | POA: Diagnosis not present

## 2017-08-16 DIAGNOSIS — N182 Chronic kidney disease, stage 2 (mild): Secondary | ICD-10-CM | POA: Diagnosis not present

## 2017-08-16 DIAGNOSIS — H409 Unspecified glaucoma: Secondary | ICD-10-CM | POA: Diagnosis not present

## 2017-08-19 DIAGNOSIS — G40909 Epilepsy, unspecified, not intractable, without status epilepticus: Secondary | ICD-10-CM | POA: Diagnosis not present

## 2017-08-19 DIAGNOSIS — E1122 Type 2 diabetes mellitus with diabetic chronic kidney disease: Secondary | ICD-10-CM | POA: Diagnosis not present

## 2017-08-19 DIAGNOSIS — H409 Unspecified glaucoma: Secondary | ICD-10-CM | POA: Diagnosis not present

## 2017-08-19 DIAGNOSIS — R300 Dysuria: Secondary | ICD-10-CM | POA: Diagnosis not present

## 2017-08-19 DIAGNOSIS — N182 Chronic kidney disease, stage 2 (mild): Secondary | ICD-10-CM | POA: Diagnosis not present

## 2017-08-19 DIAGNOSIS — I129 Hypertensive chronic kidney disease with stage 1 through stage 4 chronic kidney disease, or unspecified chronic kidney disease: Secondary | ICD-10-CM | POA: Diagnosis not present

## 2017-08-21 DIAGNOSIS — R300 Dysuria: Secondary | ICD-10-CM | POA: Diagnosis not present

## 2017-08-21 DIAGNOSIS — H409 Unspecified glaucoma: Secondary | ICD-10-CM | POA: Diagnosis not present

## 2017-08-21 DIAGNOSIS — G40909 Epilepsy, unspecified, not intractable, without status epilepticus: Secondary | ICD-10-CM | POA: Diagnosis not present

## 2017-08-21 DIAGNOSIS — E1122 Type 2 diabetes mellitus with diabetic chronic kidney disease: Secondary | ICD-10-CM | POA: Diagnosis not present

## 2017-08-21 DIAGNOSIS — I129 Hypertensive chronic kidney disease with stage 1 through stage 4 chronic kidney disease, or unspecified chronic kidney disease: Secondary | ICD-10-CM | POA: Diagnosis not present

## 2017-08-21 DIAGNOSIS — N182 Chronic kidney disease, stage 2 (mild): Secondary | ICD-10-CM | POA: Diagnosis not present

## 2017-08-22 DIAGNOSIS — R300 Dysuria: Secondary | ICD-10-CM | POA: Diagnosis not present

## 2017-08-22 DIAGNOSIS — G40909 Epilepsy, unspecified, not intractable, without status epilepticus: Secondary | ICD-10-CM | POA: Diagnosis not present

## 2017-08-22 DIAGNOSIS — H409 Unspecified glaucoma: Secondary | ICD-10-CM | POA: Diagnosis not present

## 2017-08-22 DIAGNOSIS — N182 Chronic kidney disease, stage 2 (mild): Secondary | ICD-10-CM | POA: Diagnosis not present

## 2017-08-22 DIAGNOSIS — E1122 Type 2 diabetes mellitus with diabetic chronic kidney disease: Secondary | ICD-10-CM | POA: Diagnosis not present

## 2017-08-22 DIAGNOSIS — I129 Hypertensive chronic kidney disease with stage 1 through stage 4 chronic kidney disease, or unspecified chronic kidney disease: Secondary | ICD-10-CM | POA: Diagnosis not present

## 2017-08-26 DIAGNOSIS — I129 Hypertensive chronic kidney disease with stage 1 through stage 4 chronic kidney disease, or unspecified chronic kidney disease: Secondary | ICD-10-CM | POA: Diagnosis not present

## 2017-08-26 DIAGNOSIS — N182 Chronic kidney disease, stage 2 (mild): Secondary | ICD-10-CM | POA: Diagnosis not present

## 2017-08-26 DIAGNOSIS — H409 Unspecified glaucoma: Secondary | ICD-10-CM | POA: Diagnosis not present

## 2017-08-26 DIAGNOSIS — G40909 Epilepsy, unspecified, not intractable, without status epilepticus: Secondary | ICD-10-CM | POA: Diagnosis not present

## 2017-08-26 DIAGNOSIS — E1122 Type 2 diabetes mellitus with diabetic chronic kidney disease: Secondary | ICD-10-CM | POA: Diagnosis not present

## 2017-08-26 DIAGNOSIS — R300 Dysuria: Secondary | ICD-10-CM | POA: Diagnosis not present

## 2017-08-28 DIAGNOSIS — R300 Dysuria: Secondary | ICD-10-CM | POA: Diagnosis not present

## 2017-08-28 DIAGNOSIS — H409 Unspecified glaucoma: Secondary | ICD-10-CM | POA: Diagnosis not present

## 2017-08-28 DIAGNOSIS — E1122 Type 2 diabetes mellitus with diabetic chronic kidney disease: Secondary | ICD-10-CM | POA: Diagnosis not present

## 2017-08-28 DIAGNOSIS — I129 Hypertensive chronic kidney disease with stage 1 through stage 4 chronic kidney disease, or unspecified chronic kidney disease: Secondary | ICD-10-CM | POA: Diagnosis not present

## 2017-08-28 DIAGNOSIS — N182 Chronic kidney disease, stage 2 (mild): Secondary | ICD-10-CM | POA: Diagnosis not present

## 2017-08-28 DIAGNOSIS — G40909 Epilepsy, unspecified, not intractable, without status epilepticus: Secondary | ICD-10-CM | POA: Diagnosis not present

## 2017-08-29 DIAGNOSIS — R300 Dysuria: Secondary | ICD-10-CM | POA: Diagnosis not present

## 2017-08-29 DIAGNOSIS — H409 Unspecified glaucoma: Secondary | ICD-10-CM | POA: Diagnosis not present

## 2017-08-29 DIAGNOSIS — N182 Chronic kidney disease, stage 2 (mild): Secondary | ICD-10-CM | POA: Diagnosis not present

## 2017-08-29 DIAGNOSIS — E1122 Type 2 diabetes mellitus with diabetic chronic kidney disease: Secondary | ICD-10-CM | POA: Diagnosis not present

## 2017-08-29 DIAGNOSIS — G40909 Epilepsy, unspecified, not intractable, without status epilepticus: Secondary | ICD-10-CM | POA: Diagnosis not present

## 2017-08-29 DIAGNOSIS — I129 Hypertensive chronic kidney disease with stage 1 through stage 4 chronic kidney disease, or unspecified chronic kidney disease: Secondary | ICD-10-CM | POA: Diagnosis not present

## 2017-08-30 DIAGNOSIS — G40909 Epilepsy, unspecified, not intractable, without status epilepticus: Secondary | ICD-10-CM | POA: Diagnosis not present

## 2017-08-30 DIAGNOSIS — N182 Chronic kidney disease, stage 2 (mild): Secondary | ICD-10-CM | POA: Diagnosis not present

## 2017-08-30 DIAGNOSIS — H409 Unspecified glaucoma: Secondary | ICD-10-CM | POA: Diagnosis not present

## 2017-08-30 DIAGNOSIS — R300 Dysuria: Secondary | ICD-10-CM | POA: Diagnosis not present

## 2017-08-30 DIAGNOSIS — I129 Hypertensive chronic kidney disease with stage 1 through stage 4 chronic kidney disease, or unspecified chronic kidney disease: Secondary | ICD-10-CM | POA: Diagnosis not present

## 2017-08-30 DIAGNOSIS — E1122 Type 2 diabetes mellitus with diabetic chronic kidney disease: Secondary | ICD-10-CM | POA: Diagnosis not present

## 2017-09-02 DIAGNOSIS — H409 Unspecified glaucoma: Secondary | ICD-10-CM | POA: Diagnosis not present

## 2017-09-02 DIAGNOSIS — R300 Dysuria: Secondary | ICD-10-CM | POA: Diagnosis not present

## 2017-09-02 DIAGNOSIS — E1122 Type 2 diabetes mellitus with diabetic chronic kidney disease: Secondary | ICD-10-CM | POA: Diagnosis not present

## 2017-09-02 DIAGNOSIS — G40909 Epilepsy, unspecified, not intractable, without status epilepticus: Secondary | ICD-10-CM | POA: Diagnosis not present

## 2017-09-02 DIAGNOSIS — I129 Hypertensive chronic kidney disease with stage 1 through stage 4 chronic kidney disease, or unspecified chronic kidney disease: Secondary | ICD-10-CM | POA: Diagnosis not present

## 2017-09-02 DIAGNOSIS — N182 Chronic kidney disease, stage 2 (mild): Secondary | ICD-10-CM | POA: Diagnosis not present

## 2017-09-03 DIAGNOSIS — N182 Chronic kidney disease, stage 2 (mild): Secondary | ICD-10-CM | POA: Diagnosis not present

## 2017-09-03 DIAGNOSIS — E1122 Type 2 diabetes mellitus with diabetic chronic kidney disease: Secondary | ICD-10-CM | POA: Diagnosis not present

## 2017-09-03 DIAGNOSIS — I129 Hypertensive chronic kidney disease with stage 1 through stage 4 chronic kidney disease, or unspecified chronic kidney disease: Secondary | ICD-10-CM | POA: Diagnosis not present

## 2017-09-03 DIAGNOSIS — G40909 Epilepsy, unspecified, not intractable, without status epilepticus: Secondary | ICD-10-CM | POA: Diagnosis not present

## 2017-09-03 DIAGNOSIS — R300 Dysuria: Secondary | ICD-10-CM | POA: Diagnosis not present

## 2017-09-03 DIAGNOSIS — H409 Unspecified glaucoma: Secondary | ICD-10-CM | POA: Diagnosis not present

## 2017-09-05 DIAGNOSIS — E1122 Type 2 diabetes mellitus with diabetic chronic kidney disease: Secondary | ICD-10-CM | POA: Diagnosis not present

## 2017-09-05 DIAGNOSIS — R300 Dysuria: Secondary | ICD-10-CM | POA: Diagnosis not present

## 2017-09-05 DIAGNOSIS — N182 Chronic kidney disease, stage 2 (mild): Secondary | ICD-10-CM | POA: Diagnosis not present

## 2017-09-05 DIAGNOSIS — H409 Unspecified glaucoma: Secondary | ICD-10-CM | POA: Diagnosis not present

## 2017-09-05 DIAGNOSIS — G40909 Epilepsy, unspecified, not intractable, without status epilepticus: Secondary | ICD-10-CM | POA: Diagnosis not present

## 2017-09-05 DIAGNOSIS — I129 Hypertensive chronic kidney disease with stage 1 through stage 4 chronic kidney disease, or unspecified chronic kidney disease: Secondary | ICD-10-CM | POA: Diagnosis not present

## 2017-09-06 DIAGNOSIS — H409 Unspecified glaucoma: Secondary | ICD-10-CM | POA: Diagnosis not present

## 2017-09-06 DIAGNOSIS — G40909 Epilepsy, unspecified, not intractable, without status epilepticus: Secondary | ICD-10-CM | POA: Diagnosis not present

## 2017-09-06 DIAGNOSIS — R300 Dysuria: Secondary | ICD-10-CM | POA: Diagnosis not present

## 2017-09-06 DIAGNOSIS — E1122 Type 2 diabetes mellitus with diabetic chronic kidney disease: Secondary | ICD-10-CM | POA: Diagnosis not present

## 2017-09-06 DIAGNOSIS — I129 Hypertensive chronic kidney disease with stage 1 through stage 4 chronic kidney disease, or unspecified chronic kidney disease: Secondary | ICD-10-CM | POA: Diagnosis not present

## 2017-09-06 DIAGNOSIS — N182 Chronic kidney disease, stage 2 (mild): Secondary | ICD-10-CM | POA: Diagnosis not present

## 2017-09-10 DIAGNOSIS — N182 Chronic kidney disease, stage 2 (mild): Secondary | ICD-10-CM | POA: Diagnosis not present

## 2017-09-10 DIAGNOSIS — E1122 Type 2 diabetes mellitus with diabetic chronic kidney disease: Secondary | ICD-10-CM | POA: Diagnosis not present

## 2017-09-10 DIAGNOSIS — H409 Unspecified glaucoma: Secondary | ICD-10-CM | POA: Diagnosis not present

## 2017-09-10 DIAGNOSIS — I129 Hypertensive chronic kidney disease with stage 1 through stage 4 chronic kidney disease, or unspecified chronic kidney disease: Secondary | ICD-10-CM | POA: Diagnosis not present

## 2017-09-10 DIAGNOSIS — R300 Dysuria: Secondary | ICD-10-CM | POA: Diagnosis not present

## 2017-09-10 DIAGNOSIS — G40909 Epilepsy, unspecified, not intractable, without status epilepticus: Secondary | ICD-10-CM | POA: Diagnosis not present

## 2017-09-13 DIAGNOSIS — R350 Frequency of micturition: Secondary | ICD-10-CM | POA: Diagnosis not present

## 2017-09-13 DIAGNOSIS — R5383 Other fatigue: Secondary | ICD-10-CM | POA: Diagnosis not present

## 2017-09-13 DIAGNOSIS — R300 Dysuria: Secondary | ICD-10-CM | POA: Diagnosis not present

## 2017-09-13 DIAGNOSIS — N182 Chronic kidney disease, stage 2 (mild): Secondary | ICD-10-CM | POA: Diagnosis not present

## 2017-09-13 DIAGNOSIS — E663 Overweight: Secondary | ICD-10-CM | POA: Diagnosis not present

## 2017-09-13 DIAGNOSIS — G40909 Epilepsy, unspecified, not intractable, without status epilepticus: Secondary | ICD-10-CM | POA: Diagnosis not present

## 2017-09-13 DIAGNOSIS — E7800 Pure hypercholesterolemia, unspecified: Secondary | ICD-10-CM | POA: Diagnosis not present

## 2017-09-13 DIAGNOSIS — I10 Essential (primary) hypertension: Secondary | ICD-10-CM | POA: Diagnosis not present

## 2017-09-13 DIAGNOSIS — H409 Unspecified glaucoma: Secondary | ICD-10-CM | POA: Diagnosis not present

## 2017-09-13 DIAGNOSIS — I129 Hypertensive chronic kidney disease with stage 1 through stage 4 chronic kidney disease, or unspecified chronic kidney disease: Secondary | ICD-10-CM | POA: Diagnosis not present

## 2017-09-13 DIAGNOSIS — E1122 Type 2 diabetes mellitus with diabetic chronic kidney disease: Secondary | ICD-10-CM | POA: Diagnosis not present

## 2017-09-13 DIAGNOSIS — R8290 Unspecified abnormal findings in urine: Secondary | ICD-10-CM | POA: Diagnosis not present

## 2017-09-17 DIAGNOSIS — I129 Hypertensive chronic kidney disease with stage 1 through stage 4 chronic kidney disease, or unspecified chronic kidney disease: Secondary | ICD-10-CM | POA: Diagnosis not present

## 2017-09-17 DIAGNOSIS — G40909 Epilepsy, unspecified, not intractable, without status epilepticus: Secondary | ICD-10-CM | POA: Diagnosis not present

## 2017-09-17 DIAGNOSIS — H409 Unspecified glaucoma: Secondary | ICD-10-CM | POA: Diagnosis not present

## 2017-09-17 DIAGNOSIS — N182 Chronic kidney disease, stage 2 (mild): Secondary | ICD-10-CM | POA: Diagnosis not present

## 2017-09-17 DIAGNOSIS — R300 Dysuria: Secondary | ICD-10-CM | POA: Diagnosis not present

## 2017-09-17 DIAGNOSIS — E1122 Type 2 diabetes mellitus with diabetic chronic kidney disease: Secondary | ICD-10-CM | POA: Diagnosis not present

## 2017-09-19 DIAGNOSIS — R300 Dysuria: Secondary | ICD-10-CM | POA: Diagnosis not present

## 2017-09-19 DIAGNOSIS — I129 Hypertensive chronic kidney disease with stage 1 through stage 4 chronic kidney disease, or unspecified chronic kidney disease: Secondary | ICD-10-CM | POA: Diagnosis not present

## 2017-09-19 DIAGNOSIS — E1122 Type 2 diabetes mellitus with diabetic chronic kidney disease: Secondary | ICD-10-CM | POA: Diagnosis not present

## 2017-09-19 DIAGNOSIS — H409 Unspecified glaucoma: Secondary | ICD-10-CM | POA: Diagnosis not present

## 2017-09-19 DIAGNOSIS — N182 Chronic kidney disease, stage 2 (mild): Secondary | ICD-10-CM | POA: Diagnosis not present

## 2017-09-19 DIAGNOSIS — G40909 Epilepsy, unspecified, not intractable, without status epilepticus: Secondary | ICD-10-CM | POA: Diagnosis not present

## 2017-09-20 DIAGNOSIS — G40909 Epilepsy, unspecified, not intractable, without status epilepticus: Secondary | ICD-10-CM | POA: Diagnosis not present

## 2017-09-20 DIAGNOSIS — Z7984 Long term (current) use of oral hypoglycemic drugs: Secondary | ICD-10-CM | POA: Diagnosis not present

## 2017-09-20 DIAGNOSIS — I129 Hypertensive chronic kidney disease with stage 1 through stage 4 chronic kidney disease, or unspecified chronic kidney disease: Secondary | ICD-10-CM | POA: Diagnosis not present

## 2017-09-20 DIAGNOSIS — H409 Unspecified glaucoma: Secondary | ICD-10-CM | POA: Diagnosis not present

## 2017-09-20 DIAGNOSIS — R300 Dysuria: Secondary | ICD-10-CM | POA: Diagnosis not present

## 2017-09-20 DIAGNOSIS — C50919 Malignant neoplasm of unspecified site of unspecified female breast: Secondary | ICD-10-CM | POA: Diagnosis not present

## 2017-09-20 DIAGNOSIS — E1122 Type 2 diabetes mellitus with diabetic chronic kidney disease: Secondary | ICD-10-CM | POA: Diagnosis not present

## 2017-09-20 DIAGNOSIS — N182 Chronic kidney disease, stage 2 (mild): Secondary | ICD-10-CM | POA: Diagnosis not present

## 2017-09-20 DIAGNOSIS — E785 Hyperlipidemia, unspecified: Secondary | ICD-10-CM | POA: Diagnosis not present

## 2017-09-20 DIAGNOSIS — I10 Essential (primary) hypertension: Secondary | ICD-10-CM | POA: Diagnosis not present

## 2017-09-24 DIAGNOSIS — N182 Chronic kidney disease, stage 2 (mild): Secondary | ICD-10-CM | POA: Diagnosis not present

## 2017-09-24 DIAGNOSIS — R300 Dysuria: Secondary | ICD-10-CM | POA: Diagnosis not present

## 2017-09-24 DIAGNOSIS — I129 Hypertensive chronic kidney disease with stage 1 through stage 4 chronic kidney disease, or unspecified chronic kidney disease: Secondary | ICD-10-CM | POA: Diagnosis not present

## 2017-09-24 DIAGNOSIS — E1122 Type 2 diabetes mellitus with diabetic chronic kidney disease: Secondary | ICD-10-CM | POA: Diagnosis not present

## 2017-09-24 DIAGNOSIS — G40909 Epilepsy, unspecified, not intractable, without status epilepticus: Secondary | ICD-10-CM | POA: Diagnosis not present

## 2017-09-24 DIAGNOSIS — H409 Unspecified glaucoma: Secondary | ICD-10-CM | POA: Diagnosis not present

## 2017-09-26 DIAGNOSIS — R300 Dysuria: Secondary | ICD-10-CM | POA: Diagnosis not present

## 2017-09-26 DIAGNOSIS — N182 Chronic kidney disease, stage 2 (mild): Secondary | ICD-10-CM | POA: Diagnosis not present

## 2017-09-26 DIAGNOSIS — I129 Hypertensive chronic kidney disease with stage 1 through stage 4 chronic kidney disease, or unspecified chronic kidney disease: Secondary | ICD-10-CM | POA: Diagnosis not present

## 2017-09-26 DIAGNOSIS — G40909 Epilepsy, unspecified, not intractable, without status epilepticus: Secondary | ICD-10-CM | POA: Diagnosis not present

## 2017-09-26 DIAGNOSIS — E1122 Type 2 diabetes mellitus with diabetic chronic kidney disease: Secondary | ICD-10-CM | POA: Diagnosis not present

## 2017-09-26 DIAGNOSIS — H409 Unspecified glaucoma: Secondary | ICD-10-CM | POA: Diagnosis not present

## 2017-09-27 DIAGNOSIS — R300 Dysuria: Secondary | ICD-10-CM | POA: Diagnosis not present

## 2017-09-27 DIAGNOSIS — G40909 Epilepsy, unspecified, not intractable, without status epilepticus: Secondary | ICD-10-CM | POA: Diagnosis not present

## 2017-09-27 DIAGNOSIS — H409 Unspecified glaucoma: Secondary | ICD-10-CM | POA: Diagnosis not present

## 2017-09-27 DIAGNOSIS — N182 Chronic kidney disease, stage 2 (mild): Secondary | ICD-10-CM | POA: Diagnosis not present

## 2017-09-27 DIAGNOSIS — E1122 Type 2 diabetes mellitus with diabetic chronic kidney disease: Secondary | ICD-10-CM | POA: Diagnosis not present

## 2017-09-27 DIAGNOSIS — I129 Hypertensive chronic kidney disease with stage 1 through stage 4 chronic kidney disease, or unspecified chronic kidney disease: Secondary | ICD-10-CM | POA: Diagnosis not present

## 2017-09-30 DIAGNOSIS — Z7984 Long term (current) use of oral hypoglycemic drugs: Secondary | ICD-10-CM | POA: Diagnosis not present

## 2017-09-30 DIAGNOSIS — E11649 Type 2 diabetes mellitus with hypoglycemia without coma: Secondary | ICD-10-CM | POA: Diagnosis not present

## 2017-09-30 DIAGNOSIS — E1122 Type 2 diabetes mellitus with diabetic chronic kidney disease: Secondary | ICD-10-CM | POA: Diagnosis not present

## 2017-10-01 DIAGNOSIS — E785 Hyperlipidemia, unspecified: Secondary | ICD-10-CM | POA: Diagnosis not present

## 2017-10-01 DIAGNOSIS — E1122 Type 2 diabetes mellitus with diabetic chronic kidney disease: Secondary | ICD-10-CM | POA: Diagnosis not present

## 2017-10-01 DIAGNOSIS — I10 Essential (primary) hypertension: Secondary | ICD-10-CM | POA: Diagnosis not present

## 2017-10-01 DIAGNOSIS — Z7984 Long term (current) use of oral hypoglycemic drugs: Secondary | ICD-10-CM | POA: Diagnosis not present

## 2017-10-01 DIAGNOSIS — C50919 Malignant neoplasm of unspecified site of unspecified female breast: Secondary | ICD-10-CM | POA: Diagnosis not present

## 2017-10-01 DIAGNOSIS — N182 Chronic kidney disease, stage 2 (mild): Secondary | ICD-10-CM | POA: Diagnosis not present

## 2017-10-11 DIAGNOSIS — G40909 Epilepsy, unspecified, not intractable, without status epilepticus: Secondary | ICD-10-CM | POA: Diagnosis not present

## 2017-10-11 DIAGNOSIS — E1122 Type 2 diabetes mellitus with diabetic chronic kidney disease: Secondary | ICD-10-CM | POA: Diagnosis not present

## 2017-10-11 DIAGNOSIS — N390 Urinary tract infection, site not specified: Secondary | ICD-10-CM | POA: Diagnosis not present

## 2017-11-24 DIAGNOSIS — I10 Essential (primary) hypertension: Secondary | ICD-10-CM | POA: Diagnosis not present

## 2017-11-24 DIAGNOSIS — E785 Hyperlipidemia, unspecified: Secondary | ICD-10-CM | POA: Diagnosis not present

## 2017-11-24 DIAGNOSIS — E1122 Type 2 diabetes mellitus with diabetic chronic kidney disease: Secondary | ICD-10-CM | POA: Diagnosis not present

## 2017-11-24 DIAGNOSIS — C50919 Malignant neoplasm of unspecified site of unspecified female breast: Secondary | ICD-10-CM | POA: Diagnosis not present

## 2017-11-24 DIAGNOSIS — N182 Chronic kidney disease, stage 2 (mild): Secondary | ICD-10-CM | POA: Diagnosis not present

## 2017-11-24 DIAGNOSIS — Z7984 Long term (current) use of oral hypoglycemic drugs: Secondary | ICD-10-CM | POA: Diagnosis not present

## 2018-01-14 DIAGNOSIS — J029 Acute pharyngitis, unspecified: Secondary | ICD-10-CM | POA: Diagnosis not present

## 2018-01-27 DIAGNOSIS — R350 Frequency of micturition: Secondary | ICD-10-CM | POA: Diagnosis not present

## 2018-02-13 DIAGNOSIS — I10 Essential (primary) hypertension: Secondary | ICD-10-CM | POA: Diagnosis not present

## 2018-02-13 DIAGNOSIS — E1122 Type 2 diabetes mellitus with diabetic chronic kidney disease: Secondary | ICD-10-CM | POA: Diagnosis not present

## 2018-02-13 DIAGNOSIS — E7800 Pure hypercholesterolemia, unspecified: Secondary | ICD-10-CM | POA: Diagnosis not present

## 2018-02-13 DIAGNOSIS — N183 Chronic kidney disease, stage 3 (moderate): Secondary | ICD-10-CM | POA: Diagnosis not present

## 2018-02-13 DIAGNOSIS — E663 Overweight: Secondary | ICD-10-CM | POA: Diagnosis not present

## 2018-02-13 DIAGNOSIS — Z6831 Body mass index (BMI) 31.0-31.9, adult: Secondary | ICD-10-CM | POA: Diagnosis not present

## 2018-02-13 DIAGNOSIS — M2550 Pain in unspecified joint: Secondary | ICD-10-CM | POA: Diagnosis not present

## 2018-02-13 DIAGNOSIS — N390 Urinary tract infection, site not specified: Secondary | ICD-10-CM | POA: Diagnosis not present

## 2018-02-13 DIAGNOSIS — G40909 Epilepsy, unspecified, not intractable, without status epilepticus: Secondary | ICD-10-CM | POA: Diagnosis not present

## 2018-04-30 DIAGNOSIS — E119 Type 2 diabetes mellitus without complications: Secondary | ICD-10-CM | POA: Diagnosis not present

## 2018-04-30 DIAGNOSIS — H35372 Puckering of macula, left eye: Secondary | ICD-10-CM | POA: Diagnosis not present

## 2018-04-30 DIAGNOSIS — H40013 Open angle with borderline findings, low risk, bilateral: Secondary | ICD-10-CM | POA: Diagnosis not present

## 2018-08-25 ENCOUNTER — Other Ambulatory Visit: Payer: Self-pay | Admitting: Nurse Practitioner

## 2018-08-25 ENCOUNTER — Other Ambulatory Visit (HOSPITAL_COMMUNITY)
Admission: RE | Admit: 2018-08-25 | Discharge: 2018-08-25 | Disposition: A | Discharge status: Home / Self Care | Payer: Medicare Other | Source: Ambulatory Visit | Attending: Nurse Practitioner | Admitting: Nurse Practitioner

## 2018-08-25 DIAGNOSIS — K648 Other hemorrhoids: Secondary | ICD-10-CM | POA: Diagnosis not present

## 2018-08-25 DIAGNOSIS — N390 Urinary tract infection, site not specified: Secondary | ICD-10-CM | POA: Diagnosis not present

## 2018-08-25 DIAGNOSIS — I10 Essential (primary) hypertension: Secondary | ICD-10-CM | POA: Diagnosis not present

## 2018-08-25 DIAGNOSIS — K625 Hemorrhage of anus and rectum: Secondary | ICD-10-CM | POA: Diagnosis not present

## 2018-08-25 DIAGNOSIS — N939 Abnormal uterine and vaginal bleeding, unspecified: Secondary | ICD-10-CM | POA: Diagnosis not present

## 2018-08-25 DIAGNOSIS — Z01411 Encounter for gynecological examination (general) (routine) with abnormal findings: Secondary | ICD-10-CM | POA: Insufficient documentation

## 2018-08-25 DIAGNOSIS — R8290 Unspecified abnormal findings in urine: Secondary | ICD-10-CM | POA: Diagnosis not present

## 2018-08-29 DIAGNOSIS — E1122 Type 2 diabetes mellitus with diabetic chronic kidney disease: Secondary | ICD-10-CM | POA: Diagnosis not present

## 2018-08-29 DIAGNOSIS — C50919 Malignant neoplasm of unspecified site of unspecified female breast: Secondary | ICD-10-CM | POA: Diagnosis not present

## 2018-08-29 DIAGNOSIS — E785 Hyperlipidemia, unspecified: Secondary | ICD-10-CM | POA: Diagnosis not present

## 2018-08-29 DIAGNOSIS — N182 Chronic kidney disease, stage 2 (mild): Secondary | ICD-10-CM | POA: Diagnosis not present

## 2018-08-29 DIAGNOSIS — N183 Chronic kidney disease, stage 3 (moderate): Secondary | ICD-10-CM | POA: Diagnosis not present

## 2018-08-29 DIAGNOSIS — I10 Essential (primary) hypertension: Secondary | ICD-10-CM | POA: Diagnosis not present

## 2018-08-29 LAB — CYTOLOGY - PAP
Diagnosis: NEGATIVE
HPV: NOT DETECTED

## 2018-09-16 DIAGNOSIS — E1122 Type 2 diabetes mellitus with diabetic chronic kidney disease: Secondary | ICD-10-CM | POA: Diagnosis not present

## 2018-09-16 DIAGNOSIS — N182 Chronic kidney disease, stage 2 (mild): Secondary | ICD-10-CM | POA: Diagnosis not present

## 2018-09-16 DIAGNOSIS — N183 Chronic kidney disease, stage 3 (moderate): Secondary | ICD-10-CM | POA: Diagnosis not present

## 2018-09-16 DIAGNOSIS — E785 Hyperlipidemia, unspecified: Secondary | ICD-10-CM | POA: Diagnosis not present

## 2018-09-16 DIAGNOSIS — I10 Essential (primary) hypertension: Secondary | ICD-10-CM | POA: Diagnosis not present

## 2018-09-16 DIAGNOSIS — C50919 Malignant neoplasm of unspecified site of unspecified female breast: Secondary | ICD-10-CM | POA: Diagnosis not present

## 2018-09-19 DIAGNOSIS — K648 Other hemorrhoids: Secondary | ICD-10-CM | POA: Diagnosis not present

## 2018-09-19 DIAGNOSIS — K625 Hemorrhage of anus and rectum: Secondary | ICD-10-CM | POA: Diagnosis not present

## 2018-09-22 DIAGNOSIS — K649 Unspecified hemorrhoids: Secondary | ICD-10-CM | POA: Diagnosis not present

## 2018-09-22 DIAGNOSIS — K625 Hemorrhage of anus and rectum: Secondary | ICD-10-CM | POA: Diagnosis not present

## 2018-09-22 DIAGNOSIS — Z23 Encounter for immunization: Secondary | ICD-10-CM | POA: Diagnosis not present

## 2018-09-29 DIAGNOSIS — C50919 Malignant neoplasm of unspecified site of unspecified female breast: Secondary | ICD-10-CM | POA: Diagnosis not present

## 2018-09-29 DIAGNOSIS — E1122 Type 2 diabetes mellitus with diabetic chronic kidney disease: Secondary | ICD-10-CM | POA: Diagnosis not present

## 2018-09-29 DIAGNOSIS — N183 Chronic kidney disease, stage 3 (moderate): Secondary | ICD-10-CM | POA: Diagnosis not present

## 2018-09-29 DIAGNOSIS — E785 Hyperlipidemia, unspecified: Secondary | ICD-10-CM | POA: Diagnosis not present

## 2018-09-29 DIAGNOSIS — I10 Essential (primary) hypertension: Secondary | ICD-10-CM | POA: Diagnosis not present

## 2018-09-29 DIAGNOSIS — N182 Chronic kidney disease, stage 2 (mild): Secondary | ICD-10-CM | POA: Diagnosis not present

## 2018-11-03 DIAGNOSIS — E1122 Type 2 diabetes mellitus with diabetic chronic kidney disease: Secondary | ICD-10-CM | POA: Diagnosis not present

## 2018-11-03 DIAGNOSIS — E7800 Pure hypercholesterolemia, unspecified: Secondary | ICD-10-CM | POA: Diagnosis not present

## 2018-11-03 DIAGNOSIS — C50919 Malignant neoplasm of unspecified site of unspecified female breast: Secondary | ICD-10-CM | POA: Diagnosis not present

## 2018-11-03 DIAGNOSIS — E663 Overweight: Secondary | ICD-10-CM | POA: Diagnosis not present

## 2018-11-03 DIAGNOSIS — N183 Chronic kidney disease, stage 3 (moderate): Secondary | ICD-10-CM | POA: Diagnosis not present

## 2018-11-03 DIAGNOSIS — I10 Essential (primary) hypertension: Secondary | ICD-10-CM | POA: Diagnosis not present

## 2018-11-03 DIAGNOSIS — G40909 Epilepsy, unspecified, not intractable, without status epilepticus: Secondary | ICD-10-CM | POA: Diagnosis not present

## 2018-12-09 DIAGNOSIS — N183 Chronic kidney disease, stage 3 (moderate): Secondary | ICD-10-CM | POA: Diagnosis not present

## 2018-12-09 DIAGNOSIS — R350 Frequency of micturition: Secondary | ICD-10-CM | POA: Diagnosis not present

## 2018-12-09 DIAGNOSIS — E1122 Type 2 diabetes mellitus with diabetic chronic kidney disease: Secondary | ICD-10-CM | POA: Diagnosis not present

## 2019-02-23 DIAGNOSIS — R8290 Unspecified abnormal findings in urine: Secondary | ICD-10-CM | POA: Diagnosis not present

## 2019-02-23 DIAGNOSIS — I10 Essential (primary) hypertension: Secondary | ICD-10-CM | POA: Diagnosis not present

## 2019-02-23 DIAGNOSIS — E1122 Type 2 diabetes mellitus with diabetic chronic kidney disease: Secondary | ICD-10-CM | POA: Diagnosis not present

## 2019-02-23 DIAGNOSIS — N183 Chronic kidney disease, stage 3 (moderate): Secondary | ICD-10-CM | POA: Diagnosis not present

## 2019-02-23 DIAGNOSIS — G40909 Epilepsy, unspecified, not intractable, without status epilepticus: Secondary | ICD-10-CM | POA: Diagnosis not present

## 2019-02-23 DIAGNOSIS — E7800 Pure hypercholesterolemia, unspecified: Secondary | ICD-10-CM | POA: Diagnosis not present

## 2019-02-23 DIAGNOSIS — N390 Urinary tract infection, site not specified: Secondary | ICD-10-CM | POA: Diagnosis not present

## 2019-02-23 DIAGNOSIS — R4689 Other symptoms and signs involving appearance and behavior: Secondary | ICD-10-CM | POA: Diagnosis not present

## 2019-05-13 IMAGING — CT CT HEAD W/O CM
4 series · 16 of 47 positions shown, 18 images · non-contrast
Comparison: 05/02/2005 MRI.

CLINICAL DATA: Lethargy, weakness and unsteady gait. History of
breast cancer.

EXAM:
CT HEAD WITHOUT CONTRAST
TECHNIQUE: Contiguous axial images were obtained from the base of the skull
through the vertex without intravenous contrast.

[Series 3: head without · axial · non-contrast · 0.44mm/px · z∈[-149,-29]mm · 7 of 33 slices shown, 9 images]
[im 5/33  brain]
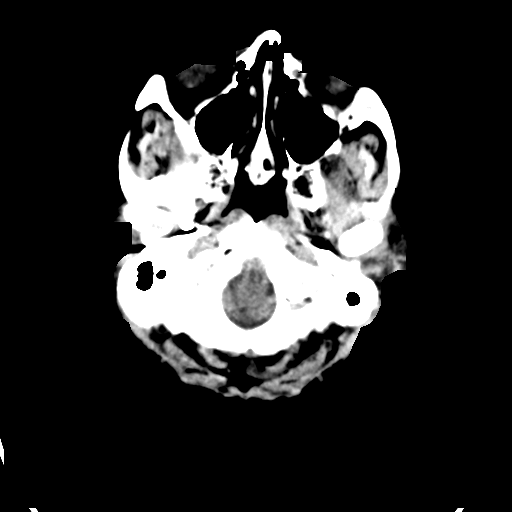
[im 5/33  bone]
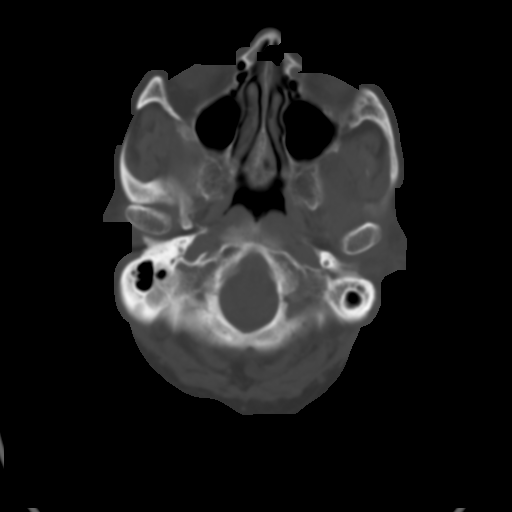
[im 9/33  brain]
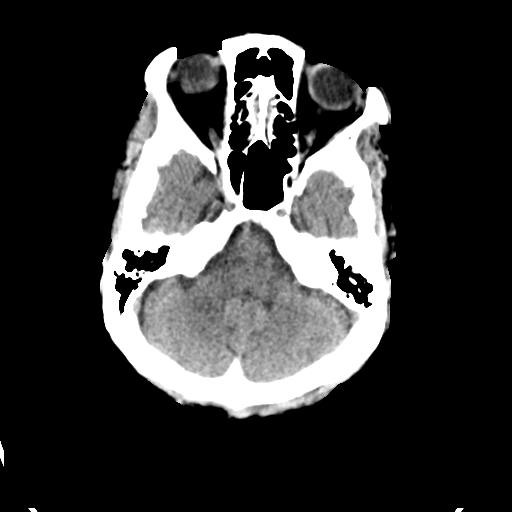
[im 13/33  brain]
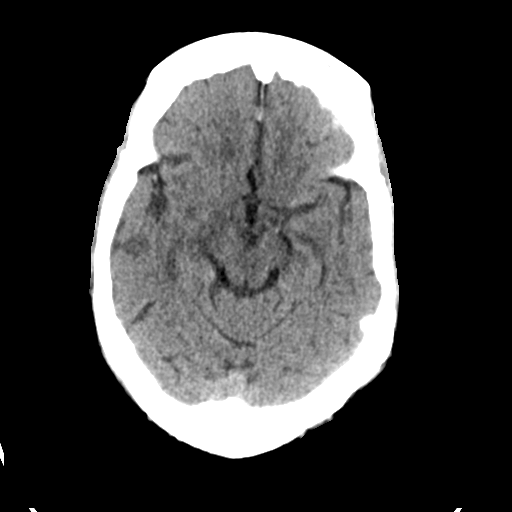
[im 17/33  brain]
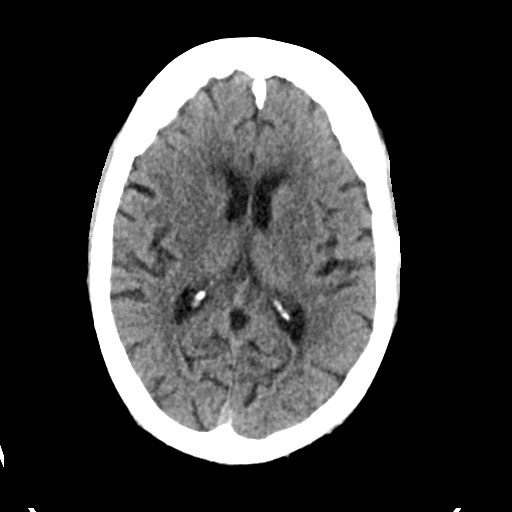
[im 21/33  brain]
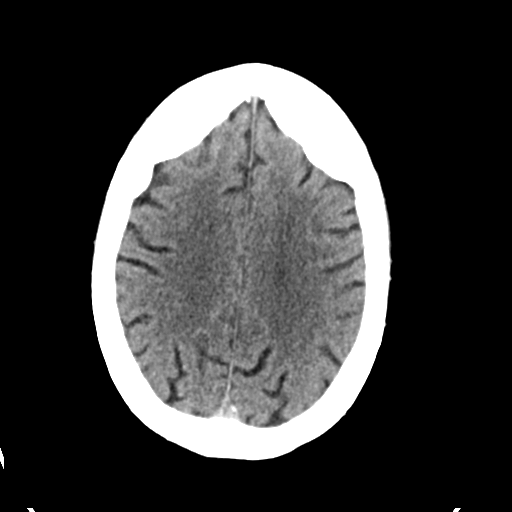
[im 21/33  bone]
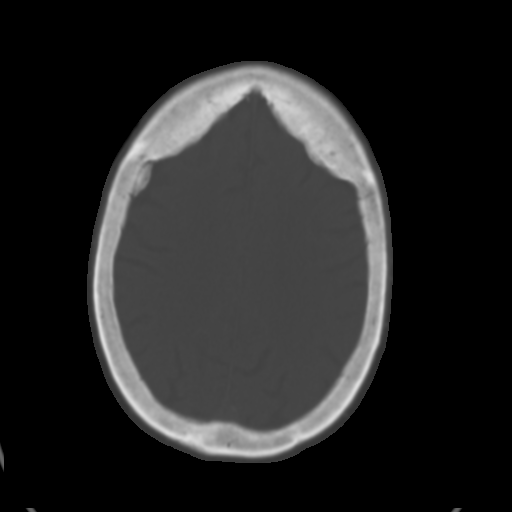
[im 25/33  brain]
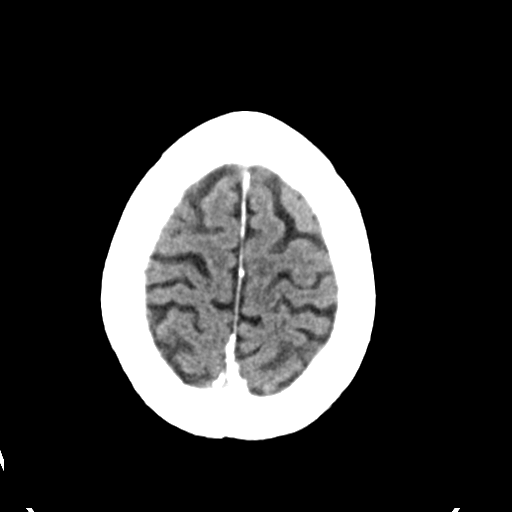
[im 29/33  brain]
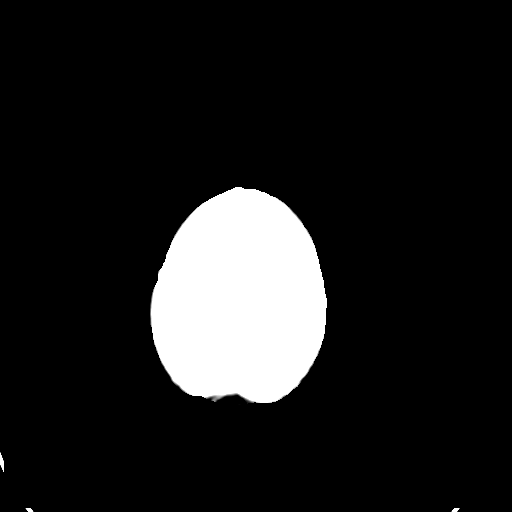

[Series 4: head bone · axial · 0.44mm/px · z∈[-153,-121]mm · 3 of 82 slices shown]
[im 9/82  bone]
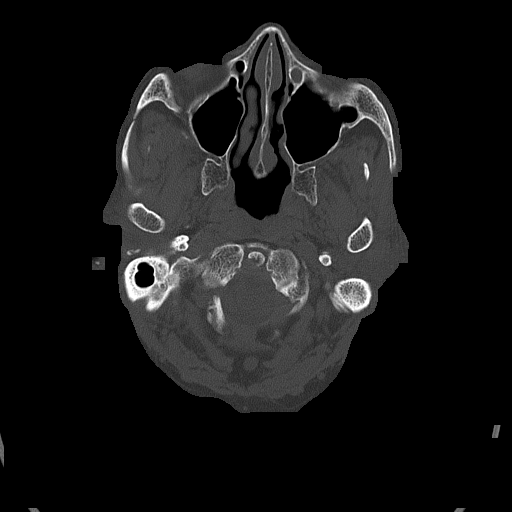
[im 17/82  bone]
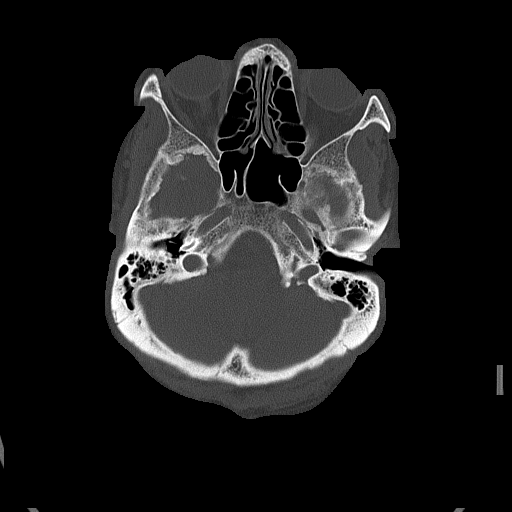
[im 25/82  bone]
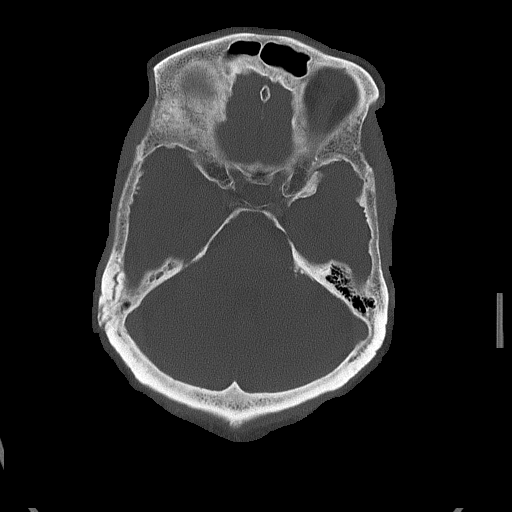

[Series 5: head without cor · coronal · non-contrast · 0.33mm/px · 3 of 68 slices shown]
[im 23/68  brain]
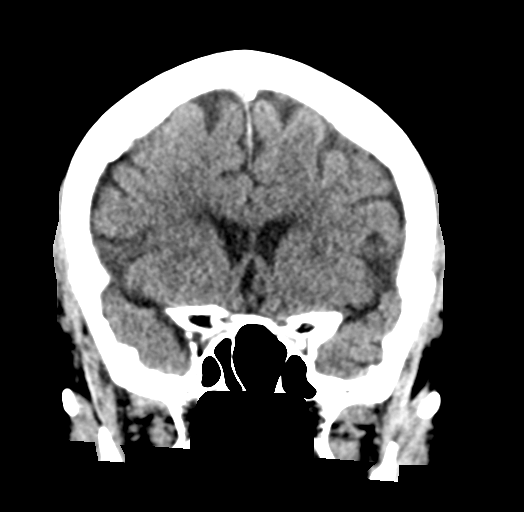
[im 30/68  brain]
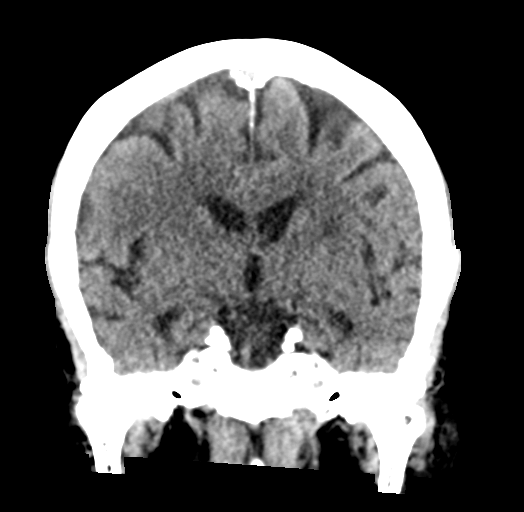
[im 38/68  brain]
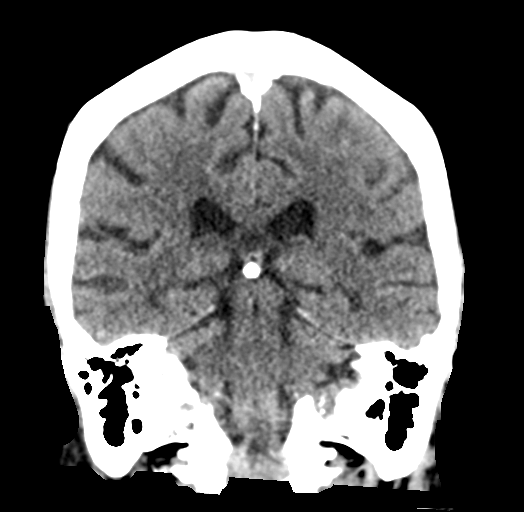

[Series 6: head without sag · sagittal · non-contrast · 0.32mm/px · 3 of 58 slices shown]
[im 20/58  brain]
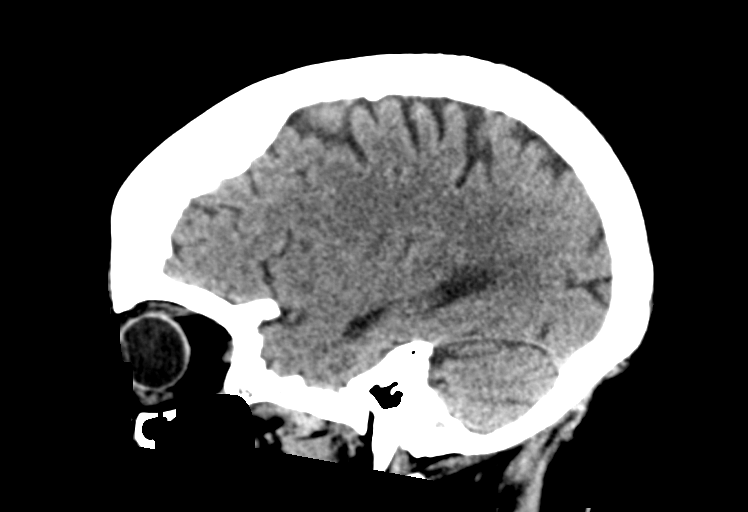
[im 29/58  brain]
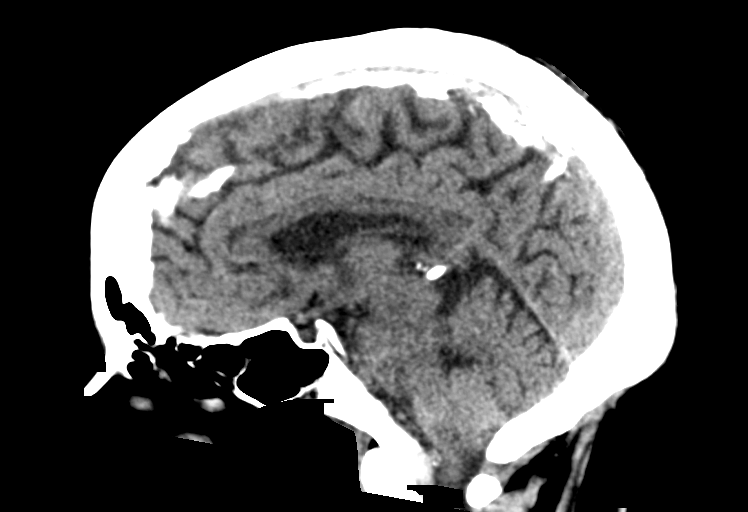
[im 39/58  brain]
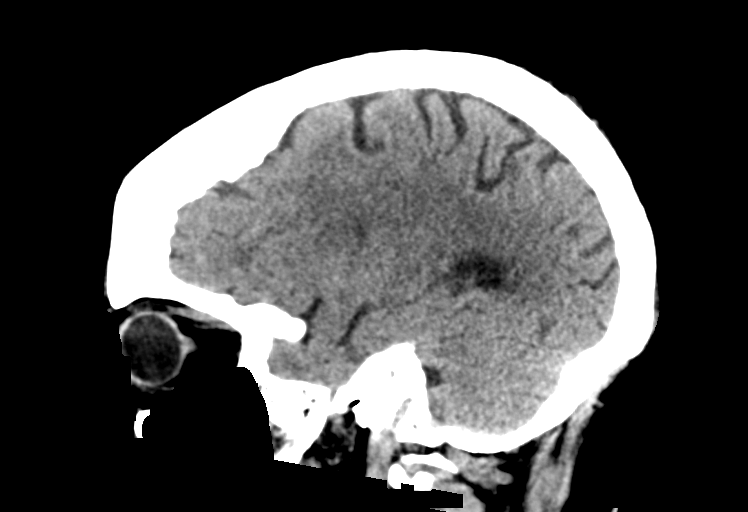

[16 of 47 positions shown; findings below may reference images not displayed]

FINDINGS: Brain: No evidence of acute large vascular territory infarction,
hemorrhage, hydrocephalus, extra-axial collection or mass
lesion/mass effect. Small vessel ischemic changes are noted of the
periventricular white matter. Findings are likely chronic given
moderate atherosclerosis at the skullbase.

Vascular: No hyperdense vessel or unexpected calcification.

Skull: Hyperostosis frontalis of the skull. No suspicious
osteoblastic or lytic lesions.

Sinuses/Orbits: No acute finding.

Other: None
IMPRESSION: Chronic appearing small vessel ischemic disease. No acute
intracranial abnormality.

## 2019-05-20 DIAGNOSIS — Z23 Encounter for immunization: Secondary | ICD-10-CM | POA: Diagnosis not present

## 2019-06-01 DIAGNOSIS — R350 Frequency of micturition: Secondary | ICD-10-CM | POA: Diagnosis not present

## 2019-09-07 DIAGNOSIS — R8290 Unspecified abnormal findings in urine: Secondary | ICD-10-CM | POA: Diagnosis not present

## 2019-09-07 DIAGNOSIS — R350 Frequency of micturition: Secondary | ICD-10-CM | POA: Diagnosis not present

## 2020-04-05 DIAGNOSIS — Z23 Encounter for immunization: Secondary | ICD-10-CM | POA: Diagnosis not present

## 2020-04-05 DIAGNOSIS — M79641 Pain in right hand: Secondary | ICD-10-CM | POA: Diagnosis not present

## 2020-04-12 DIAGNOSIS — M1811 Unilateral primary osteoarthritis of first carpometacarpal joint, right hand: Secondary | ICD-10-CM | POA: Diagnosis not present

## 2020-04-12 DIAGNOSIS — M65341 Trigger finger, right ring finger: Secondary | ICD-10-CM | POA: Diagnosis not present

## 2020-05-10 DIAGNOSIS — Z23 Encounter for immunization: Secondary | ICD-10-CM | POA: Diagnosis not present

## 2020-10-04 DIAGNOSIS — R5383 Other fatigue: Secondary | ICD-10-CM | POA: Diagnosis not present

## 2020-10-04 DIAGNOSIS — Z7984 Long term (current) use of oral hypoglycemic drugs: Secondary | ICD-10-CM | POA: Diagnosis not present

## 2020-10-04 DIAGNOSIS — R21 Rash and other nonspecific skin eruption: Secondary | ICD-10-CM | POA: Diagnosis not present

## 2020-10-04 DIAGNOSIS — R0600 Dyspnea, unspecified: Secondary | ICD-10-CM | POA: Diagnosis not present

## 2020-10-04 DIAGNOSIS — R350 Frequency of micturition: Secondary | ICD-10-CM | POA: Diagnosis not present

## 2020-10-04 DIAGNOSIS — E1122 Type 2 diabetes mellitus with diabetic chronic kidney disease: Secondary | ICD-10-CM | POA: Diagnosis not present

## 2020-10-04 DIAGNOSIS — R519 Headache, unspecified: Secondary | ICD-10-CM | POA: Diagnosis not present

## 2020-10-13 ENCOUNTER — Ambulatory Visit
Admission: RE | Admit: 2020-10-13 | Discharge: 2020-10-13 | Disposition: A | Discharge status: Home / Self Care | Payer: Medicare Other | Source: Ambulatory Visit | Attending: Internal Medicine | Admitting: Internal Medicine

## 2020-10-13 ENCOUNTER — Other Ambulatory Visit: Payer: Self-pay | Admitting: Internal Medicine

## 2020-10-13 DIAGNOSIS — I700 Atherosclerosis of aorta: Secondary | ICD-10-CM | POA: Diagnosis not present

## 2020-10-13 DIAGNOSIS — R0600 Dyspnea, unspecified: Secondary | ICD-10-CM

## 2020-10-13 DIAGNOSIS — E1122 Type 2 diabetes mellitus with diabetic chronic kidney disease: Secondary | ICD-10-CM | POA: Diagnosis not present

## 2020-10-13 DIAGNOSIS — N1832 Chronic kidney disease, stage 3b: Secondary | ICD-10-CM | POA: Diagnosis not present

## 2020-10-13 DIAGNOSIS — R109 Unspecified abdominal pain: Secondary | ICD-10-CM | POA: Diagnosis not present

## 2020-11-23 ENCOUNTER — Other Ambulatory Visit: Payer: Self-pay

## 2020-11-23 ENCOUNTER — Encounter (HOSPITAL_COMMUNITY): Payer: Self-pay | Admitting: Emergency Medicine

## 2020-11-23 ENCOUNTER — Emergency Department (HOSPITAL_COMMUNITY): Payer: PPO

## 2020-11-23 ENCOUNTER — Emergency Department (HOSPITAL_COMMUNITY)
Admission: EM | Admit: 2020-11-23 | Discharge: 2020-11-24 | Disposition: A | Discharge status: Home / Self Care | Payer: PPO | Source: Home / Self Care | Attending: Emergency Medicine | Admitting: Emergency Medicine

## 2020-11-23 DIAGNOSIS — F0390 Unspecified dementia without behavioral disturbance: Secondary | ICD-10-CM | POA: Diagnosis not present

## 2020-11-23 DIAGNOSIS — K219 Gastro-esophageal reflux disease without esophagitis: Secondary | ICD-10-CM | POA: Diagnosis not present

## 2020-11-23 DIAGNOSIS — N390 Urinary tract infection, site not specified: Secondary | ICD-10-CM | POA: Insufficient documentation

## 2020-11-23 DIAGNOSIS — N179 Acute kidney failure, unspecified: Secondary | ICD-10-CM | POA: Diagnosis not present

## 2020-11-23 DIAGNOSIS — Z5321 Procedure and treatment not carried out due to patient leaving prior to being seen by health care provider: Secondary | ICD-10-CM | POA: Insufficient documentation

## 2020-11-23 DIAGNOSIS — E1165 Type 2 diabetes mellitus with hyperglycemia: Secondary | ICD-10-CM | POA: Diagnosis not present

## 2020-11-23 DIAGNOSIS — Z9119 Patient's noncompliance with other medical treatment and regimen: Secondary | ICD-10-CM | POA: Diagnosis not present

## 2020-11-23 DIAGNOSIS — N3000 Acute cystitis without hematuria: Secondary | ICD-10-CM | POA: Diagnosis not present

## 2020-11-23 DIAGNOSIS — Z888 Allergy status to other drugs, medicaments and biological substances status: Secondary | ICD-10-CM | POA: Diagnosis not present

## 2020-11-23 DIAGNOSIS — Z79899 Other long term (current) drug therapy: Secondary | ICD-10-CM | POA: Diagnosis not present

## 2020-11-23 DIAGNOSIS — I517 Cardiomegaly: Secondary | ICD-10-CM | POA: Diagnosis not present

## 2020-11-23 DIAGNOSIS — H409 Unspecified glaucoma: Secondary | ICD-10-CM | POA: Diagnosis not present

## 2020-11-23 DIAGNOSIS — I951 Orthostatic hypotension: Secondary | ICD-10-CM | POA: Diagnosis not present

## 2020-11-23 DIAGNOSIS — R739 Hyperglycemia, unspecified: Secondary | ICD-10-CM | POA: Insufficient documentation

## 2020-11-23 DIAGNOSIS — Z7982 Long term (current) use of aspirin: Secondary | ICD-10-CM | POA: Diagnosis not present

## 2020-11-23 DIAGNOSIS — E876 Hypokalemia: Secondary | ICD-10-CM | POA: Diagnosis not present

## 2020-11-23 DIAGNOSIS — E860 Dehydration: Secondary | ICD-10-CM | POA: Insufficient documentation

## 2020-11-23 DIAGNOSIS — Z87891 Personal history of nicotine dependence: Secondary | ICD-10-CM | POA: Diagnosis not present

## 2020-11-23 DIAGNOSIS — Z20822 Contact with and (suspected) exposure to covid-19: Secondary | ICD-10-CM | POA: Diagnosis not present

## 2020-11-23 DIAGNOSIS — R634 Abnormal weight loss: Secondary | ICD-10-CM | POA: Diagnosis not present

## 2020-11-23 DIAGNOSIS — Z885 Allergy status to narcotic agent status: Secondary | ICD-10-CM | POA: Diagnosis not present

## 2020-11-23 DIAGNOSIS — Z853 Personal history of malignant neoplasm of breast: Secondary | ICD-10-CM | POA: Diagnosis not present

## 2020-11-23 DIAGNOSIS — E1110 Type 2 diabetes mellitus with ketoacidosis without coma: Secondary | ICD-10-CM | POA: Diagnosis not present

## 2020-11-23 DIAGNOSIS — Z91040 Latex allergy status: Secondary | ICD-10-CM | POA: Diagnosis not present

## 2020-11-23 DIAGNOSIS — I10 Essential (primary) hypertension: Secondary | ICD-10-CM | POA: Diagnosis not present

## 2020-11-23 DIAGNOSIS — E1101 Type 2 diabetes mellitus with hyperosmolarity with coma: Secondary | ICD-10-CM | POA: Diagnosis not present

## 2020-11-23 DIAGNOSIS — I1310 Hypertensive heart and chronic kidney disease without heart failure, with stage 1 through stage 4 chronic kidney disease, or unspecified chronic kidney disease: Secondary | ICD-10-CM | POA: Diagnosis not present

## 2020-11-23 DIAGNOSIS — R4182 Altered mental status, unspecified: Secondary | ICD-10-CM | POA: Diagnosis not present

## 2020-11-23 DIAGNOSIS — N1832 Chronic kidney disease, stage 3b: Secondary | ICD-10-CM | POA: Diagnosis not present

## 2020-11-23 DIAGNOSIS — G9341 Metabolic encephalopathy: Secondary | ICD-10-CM | POA: Diagnosis not present

## 2020-11-23 DIAGNOSIS — E1122 Type 2 diabetes mellitus with diabetic chronic kidney disease: Secondary | ICD-10-CM | POA: Diagnosis not present

## 2020-11-23 DIAGNOSIS — I452 Bifascicular block: Secondary | ICD-10-CM | POA: Diagnosis not present

## 2020-11-23 LAB — CBC WITH DIFFERENTIAL/PLATELET
Abs Immature Granulocytes: 0.01 10*3/uL (ref 0.00–0.07)
Basophils Absolute: 0 10*3/uL (ref 0.0–0.1)
Basophils Relative: 0 %
Eosinophils Absolute: 0.3 10*3/uL (ref 0.0–0.5)
Eosinophils Relative: 5 %
HCT: 38.9 % (ref 36.0–46.0)
Hemoglobin: 12.7 g/dL (ref 12.0–15.0)
Immature Granulocytes: 0 %
Lymphocytes Relative: 13 %
Lymphs Abs: 0.8 10*3/uL (ref 0.7–4.0)
MCH: 29.6 pg (ref 26.0–34.0)
MCHC: 32.6 g/dL (ref 30.0–36.0)
MCV: 90.7 fL (ref 80.0–100.0)
Monocytes Absolute: 0.5 10*3/uL (ref 0.1–1.0)
Monocytes Relative: 8 %
Neutro Abs: 4.4 10*3/uL (ref 1.7–7.7)
Neutrophils Relative %: 74 %
Platelets: 258 10*3/uL (ref 150–400)
RBC: 4.29 MIL/uL (ref 3.87–5.11)
RDW: 13.5 % (ref 11.5–15.5)
WBC: 5.9 10*3/uL (ref 4.0–10.5)
nRBC: 0 % (ref 0.0–0.2)

## 2020-11-23 LAB — COMPREHENSIVE METABOLIC PANEL
ALT: 15 U/L (ref 0–44)
AST: 17 U/L (ref 15–41)
Albumin: 2.9 g/dL — ABNORMAL LOW (ref 3.5–5.0)
Alkaline Phosphatase: 265 U/L — ABNORMAL HIGH (ref 38–126)
Anion gap: 12 (ref 5–15)
BUN: 28 mg/dL — ABNORMAL HIGH (ref 8–23)
CO2: 28 mmol/L (ref 22–32)
Calcium: 9 mg/dL (ref 8.9–10.3)
Chloride: 82 mmol/L — ABNORMAL LOW (ref 98–111)
Creatinine, Ser: 2.33 mg/dL — ABNORMAL HIGH (ref 0.44–1.00)
GFR, Estimated: 20 mL/min — ABNORMAL LOW (ref >60)
Glucose, Bld: 814 mg/dL (ref 70–99)
Potassium: 3.8 mmol/L (ref 3.5–5.1)
Sodium: 122 mmol/L — ABNORMAL LOW (ref 135–145)
Total Bilirubin: 0.4 mg/dL (ref 0.3–1.2)
Total Protein: 7.7 g/dL (ref 6.5–8.1)

## 2020-11-23 LAB — URINALYSIS, ROUTINE W REFLEX MICROSCOPIC
Bilirubin Urine: NEGATIVE
Glucose, UA: >=500 mg/dL — AB
Ketones, ur: NEGATIVE mg/dL
Nitrite: NEGATIVE
Protein, ur: 30 mg/dL — AB
Specific Gravity, Urine: 1.02 (ref 1.005–1.030)
WBC, UA: >50 WBC/hpf — ABNORMAL HIGH (ref 0–5)
pH: 5 (ref 5.0–8.0)

## 2020-11-23 LAB — MAGNESIUM: Magnesium: 1.7 mg/dL (ref 1.7–2.4)

## 2020-11-23 NOTE — ED Triage Notes (Signed)
Emergency Medicine Provider Triage Evaluation Note  ELINE GENG , a 87 y.o. female  was evaluated in triage.  Pt complains of high blood sugar. Patient was visiting family, came back home and daughter noticed patient was active sluggish, change in gait/unsteady, seen at PCP office today and called with glucose of 600s. Urine with strong smell, was dipped in office but no comment about UTI made. Denies falls. PCP recently stopped metformin.  Review of Systems  Positive: Change in gait, change in mental status Negative: Fever, abdominal pain.   Physical Exam  There were no vitals taken for this visit. Gen:   Awake, no distress   HEENT:  Atraumatic  Resp:  Normal effort  Cardiac:  Normal rate  Abd:   Nondistended, nontender  MSK:   Moves extremities without difficulty  Neuro:  Speech clear, hard of hearing   Medical Decision Making  Medically screening exam initiated at 8:10 PM.  Appropriate orders placed.  Norva Pavlov was informed that the remainder of the evaluation will be completed by another provider, this initial triage assessment does not replace that evaluation, and the importance of remaining in the ED until their evaluation is complete.  Clinical Impression     Roque Lias 11/23/20 2028

## 2020-11-23 NOTE — ED Notes (Signed)
Patient left on own accord °

## 2020-11-23 NOTE — ED Triage Notes (Addendum)
Per daughter, pt is a bit more confused than usual, this morning more lethargic.  At PCP, blood sugar was high, 665.  Urine appears strong and oderous.  PCP recently stopped diabetic medication.

## 2020-11-24 ENCOUNTER — Ambulatory Visit (HOSPITAL_COMMUNITY)
Admission: EM | Admit: 2020-11-24 | Discharge: 2020-11-24 | Disposition: A | Discharge status: Home / Self Care | Payer: PPO | Attending: Internal Medicine | Admitting: Internal Medicine

## 2020-11-24 ENCOUNTER — Inpatient Hospital Stay (HOSPITAL_COMMUNITY)
Admission: EM | Admit: 2020-11-24 | Discharge: 2020-11-27 | DRG: 637 | Disposition: A | Discharge status: Home Health | Payer: PPO | Source: Ambulatory Visit | Attending: Internal Medicine | Admitting: Internal Medicine

## 2020-11-24 ENCOUNTER — Encounter (HOSPITAL_COMMUNITY): Payer: Self-pay | Admitting: Internal Medicine

## 2020-11-24 ENCOUNTER — Encounter (HOSPITAL_COMMUNITY): Payer: Self-pay

## 2020-11-24 ENCOUNTER — Observation Stay (HOSPITAL_COMMUNITY): Payer: PPO

## 2020-11-24 DIAGNOSIS — G9340 Encephalopathy, unspecified: Secondary | ICD-10-CM | POA: Diagnosis present

## 2020-11-24 DIAGNOSIS — E876 Hypokalemia: Secondary | ICD-10-CM | POA: Diagnosis present

## 2020-11-24 DIAGNOSIS — I517 Cardiomegaly: Secondary | ICD-10-CM | POA: Diagnosis not present

## 2020-11-24 DIAGNOSIS — N1832 Chronic kidney disease, stage 3b: Secondary | ICD-10-CM | POA: Diagnosis present

## 2020-11-24 DIAGNOSIS — Z853 Personal history of malignant neoplasm of breast: Secondary | ICD-10-CM

## 2020-11-24 DIAGNOSIS — N179 Acute kidney failure, unspecified: Secondary | ICD-10-CM

## 2020-11-24 DIAGNOSIS — H409 Unspecified glaucoma: Secondary | ICD-10-CM | POA: Diagnosis present

## 2020-11-24 DIAGNOSIS — F0390 Unspecified dementia without behavioral disturbance: Secondary | ICD-10-CM | POA: Diagnosis present

## 2020-11-24 DIAGNOSIS — N390 Urinary tract infection, site not specified: Secondary | ICD-10-CM | POA: Diagnosis not present

## 2020-11-24 DIAGNOSIS — N3000 Acute cystitis without hematuria: Secondary | ICD-10-CM | POA: Diagnosis present

## 2020-11-24 DIAGNOSIS — E1122 Type 2 diabetes mellitus with diabetic chronic kidney disease: Secondary | ICD-10-CM | POA: Diagnosis present

## 2020-11-24 DIAGNOSIS — K219 Gastro-esophageal reflux disease without esophagitis: Secondary | ICD-10-CM | POA: Diagnosis present

## 2020-11-24 DIAGNOSIS — E1110 Type 2 diabetes mellitus with ketoacidosis without coma: Secondary | ICD-10-CM | POA: Diagnosis not present

## 2020-11-24 DIAGNOSIS — E860 Dehydration: Secondary | ICD-10-CM | POA: Diagnosis present

## 2020-11-24 DIAGNOSIS — Z888 Allergy status to other drugs, medicaments and biological substances status: Secondary | ICD-10-CM

## 2020-11-24 DIAGNOSIS — E1101 Type 2 diabetes mellitus with hyperosmolarity with coma: Principal | ICD-10-CM | POA: Diagnosis present

## 2020-11-24 DIAGNOSIS — R739 Hyperglycemia, unspecified: Secondary | ICD-10-CM | POA: Diagnosis not present

## 2020-11-24 DIAGNOSIS — Z20822 Contact with and (suspected) exposure to covid-19: Secondary | ICD-10-CM | POA: Diagnosis present

## 2020-11-24 DIAGNOSIS — I452 Bifascicular block: Secondary | ICD-10-CM | POA: Diagnosis present

## 2020-11-24 DIAGNOSIS — I1310 Hypertensive heart and chronic kidney disease without heart failure, with stage 1 through stage 4 chronic kidney disease, or unspecified chronic kidney disease: Secondary | ICD-10-CM | POA: Diagnosis present

## 2020-11-24 DIAGNOSIS — Z79899 Other long term (current) drug therapy: Secondary | ICD-10-CM

## 2020-11-24 DIAGNOSIS — G9341 Metabolic encephalopathy: Secondary | ICD-10-CM | POA: Diagnosis not present

## 2020-11-24 DIAGNOSIS — Z7982 Long term (current) use of aspirin: Secondary | ICD-10-CM

## 2020-11-24 DIAGNOSIS — E1165 Type 2 diabetes mellitus with hyperglycemia: Secondary | ICD-10-CM | POA: Diagnosis not present

## 2020-11-24 DIAGNOSIS — Z91040 Latex allergy status: Secondary | ICD-10-CM

## 2020-11-24 DIAGNOSIS — Z885 Allergy status to narcotic agent status: Secondary | ICD-10-CM

## 2020-11-24 DIAGNOSIS — I10 Essential (primary) hypertension: Secondary | ICD-10-CM | POA: Diagnosis present

## 2020-11-24 DIAGNOSIS — Z9119 Patient's noncompliance with other medical treatment and regimen: Secondary | ICD-10-CM

## 2020-11-24 DIAGNOSIS — N3001 Acute cystitis with hematuria: Secondary | ICD-10-CM

## 2020-11-24 DIAGNOSIS — Z87891 Personal history of nicotine dependence: Secondary | ICD-10-CM

## 2020-11-24 HISTORY — DX: Unspecified dementia, unspecified severity, without behavioral disturbance, psychotic disturbance, mood disturbance, and anxiety: F03.90

## 2020-11-24 LAB — URINALYSIS, ROUTINE W REFLEX MICROSCOPIC
Bilirubin Urine: NEGATIVE
Glucose, UA: >=500 mg/dL — AB
Ketones, ur: NEGATIVE mg/dL
Nitrite: NEGATIVE
Protein, ur: 100 mg/dL — AB
Specific Gravity, Urine: 1.014 (ref 1.005–1.030)
WBC, UA: >50 WBC/hpf — ABNORMAL HIGH (ref 0–5)
pH: 5 (ref 5.0–8.0)

## 2020-11-24 LAB — GLUCOSE, CAPILLARY
Glucose-Capillary: 181 mg/dL — ABNORMAL HIGH (ref 70–99)
Glucose-Capillary: 239 mg/dL — ABNORMAL HIGH (ref 70–99)
Glucose-Capillary: 229 mg/dL — ABNORMAL HIGH (ref 70–99)
Glucose-Capillary: 252 mg/dL — ABNORMAL HIGH (ref 70–99)
Glucose-Capillary: 211 mg/dL — ABNORMAL HIGH (ref 70–99)
Glucose-Capillary: 115 mg/dL — ABNORMAL HIGH (ref 70–99)
Glucose-Capillary: 178 mg/dL — ABNORMAL HIGH (ref 70–99)

## 2020-11-24 LAB — COMPREHENSIVE METABOLIC PANEL
ALT: 16 U/L (ref 0–44)
AST: 21 U/L (ref 15–41)
Albumin: 3 g/dL — ABNORMAL LOW (ref 3.5–5.0)
Alkaline Phosphatase: 254 U/L — ABNORMAL HIGH (ref 38–126)
Anion gap: 14 (ref 5–15)
BUN: 29 mg/dL — ABNORMAL HIGH (ref 8–23)
CO2: 29 mmol/L (ref 22–32)
Calcium: 9.4 mg/dL (ref 8.9–10.3)
Chloride: 88 mmol/L — ABNORMAL LOW (ref 98–111)
Creatinine, Ser: 2.24 mg/dL — ABNORMAL HIGH (ref 0.44–1.00)
GFR, Estimated: 20 mL/min — ABNORMAL LOW (ref >60)
Glucose, Bld: 278 mg/dL — ABNORMAL HIGH (ref 70–99)
Potassium: 3.2 mmol/L — ABNORMAL LOW (ref 3.5–5.1)
Sodium: 131 mmol/L — ABNORMAL LOW (ref 135–145)
Total Bilirubin: 0.3 mg/dL (ref 0.3–1.2)
Total Protein: 7.7 g/dL (ref 6.5–8.1)

## 2020-11-24 LAB — BASIC METABOLIC PANEL
Anion gap: 12 (ref 5–15)
Anion gap: 10 (ref 5–15)
BUN: 26 mg/dL — ABNORMAL HIGH (ref 8–23)
BUN: 27 mg/dL — ABNORMAL HIGH (ref 8–23)
CO2: 30 mmol/L (ref 22–32)
CO2: 28 mmol/L (ref 22–32)
Calcium: 9 mg/dL (ref 8.9–10.3)
Calcium: 8.7 mg/dL — ABNORMAL LOW (ref 8.9–10.3)
Chloride: 87 mmol/L — ABNORMAL LOW (ref 98–111)
Chloride: 91 mmol/L — ABNORMAL LOW (ref 98–111)
Creatinine, Ser: 2.04 mg/dL — ABNORMAL HIGH (ref 0.44–1.00)
Creatinine, Ser: 2.03 mg/dL — ABNORMAL HIGH (ref 0.44–1.00)
GFR, Estimated: 23 mL/min — ABNORMAL LOW (ref >60)
GFR, Estimated: 23 mL/min — ABNORMAL LOW (ref >60)
Glucose, Bld: 366 mg/dL — ABNORMAL HIGH (ref 70–99)
Glucose, Bld: 228 mg/dL — ABNORMAL HIGH (ref 70–99)
Potassium: 3.6 mmol/L (ref 3.5–5.1)
Potassium: 2.7 mmol/L — CL (ref 3.5–5.1)
Sodium: 129 mmol/L — ABNORMAL LOW (ref 135–145)
Sodium: 129 mmol/L — ABNORMAL LOW (ref 135–145)

## 2020-11-24 LAB — OSMOLALITY: Osmolality: 297 mOsm/kg — ABNORMAL HIGH (ref 275–295)

## 2020-11-24 LAB — RAPID URINE DRUG SCREEN, HOSP PERFORMED
Amphetamines: NOT DETECTED
Barbiturates: NOT DETECTED
Benzodiazepines: NOT DETECTED
Cocaine: NOT DETECTED
Opiates: NOT DETECTED
Tetrahydrocannabinol: NOT DETECTED

## 2020-11-24 LAB — CBG MONITORING, ED
Glucose-Capillary: 555 mg/dL (ref 70–99)
Glucose-Capillary: 562 mg/dL (ref 70–99)
Glucose-Capillary: 504 mg/dL (ref 70–99)
Glucose-Capillary: 427 mg/dL — ABNORMAL HIGH (ref 70–99)
Glucose-Capillary: 264 mg/dL — ABNORMAL HIGH (ref 70–99)
Glucose-Capillary: 195 mg/dL — ABNORMAL HIGH (ref 70–99)
Glucose-Capillary: 188 mg/dL — ABNORMAL HIGH (ref 70–99)

## 2020-11-24 LAB — I-STAT VENOUS BLOOD GAS, ED
Acid-Base Excess: 8 mmol/L — ABNORMAL HIGH (ref 0.0–2.0)
Bicarbonate: 35.4 mmol/L — ABNORMAL HIGH (ref 20.0–28.0)
Calcium, Ion: 1.09 mmol/L — ABNORMAL LOW (ref 1.15–1.40)
HCT: 43 % (ref 36.0–46.0)
Hemoglobin: 14.6 g/dL (ref 12.0–15.0)
O2 Saturation: 90 %
Potassium: 3.6 mmol/L (ref 3.5–5.1)
Sodium: 126 mmol/L — ABNORMAL LOW (ref 135–145)
TCO2: 37 mmol/L — ABNORMAL HIGH (ref 22–32)
pCO2, Ven: 61.4 mmHg — ABNORMAL HIGH (ref 44.0–60.0)
pH, Ven: 7.369 (ref 7.250–7.430)
pO2, Ven: 62 mmHg — ABNORMAL HIGH (ref 32.0–45.0)

## 2020-11-24 LAB — CBC WITH DIFFERENTIAL/PLATELET
Abs Immature Granulocytes: 0.02 10*3/uL (ref 0.00–0.07)
Basophils Absolute: 0 10*3/uL (ref 0.0–0.1)
Basophils Relative: 0 %
Eosinophils Absolute: 0.2 10*3/uL (ref 0.0–0.5)
Eosinophils Relative: 4 %
HCT: 40 % (ref 36.0–46.0)
Hemoglobin: 13.2 g/dL (ref 12.0–15.0)
Immature Granulocytes: 0 %
Lymphocytes Relative: 16 %
Lymphs Abs: 0.8 10*3/uL (ref 0.7–4.0)
MCH: 29.3 pg (ref 26.0–34.0)
MCHC: 33 g/dL (ref 30.0–36.0)
MCV: 88.7 fL (ref 80.0–100.0)
Monocytes Absolute: 0.4 10*3/uL (ref 0.1–1.0)
Monocytes Relative: 8 %
Neutro Abs: 3.7 10*3/uL (ref 1.7–7.7)
Neutrophils Relative %: 72 %
Platelets: 232 10*3/uL (ref 150–400)
RBC: 4.51 MIL/uL (ref 3.87–5.11)
RDW: 13.3 % (ref 11.5–15.5)
WBC: 5.2 10*3/uL (ref 4.0–10.5)
nRBC: 0 % (ref 0.0–0.2)

## 2020-11-24 LAB — SODIUM, URINE, RANDOM: Sodium, Ur: 48 mmol/L

## 2020-11-24 LAB — MAGNESIUM: Magnesium: 1.7 mg/dL (ref 1.7–2.4)

## 2020-11-24 LAB — HEMOGLOBIN A1C
Hgb A1c MFr Bld: 12.5 % — ABNORMAL HIGH (ref 4.8–5.6)
Mean Plasma Glucose: 312.05 mg/dL

## 2020-11-24 LAB — CREATININE, URINE, RANDOM: Creatinine, Urine: 97.89 mg/dL

## 2020-11-24 MED ORDER — AMLODIPINE BESYLATE 10 MG PO TABS
10.0000 mg | ORAL_TABLET | Freq: Every day | ORAL | Status: DC
  Administered 2020-11-25 – 2020-11-27 (×3): 10 mg via ORAL
  Filled 2020-11-24 (×3): qty 1

## 2020-11-24 MED ORDER — DEXTROSE 50 % IV SOLN: 0.0000 mL | INTRAVENOUS | Status: DC | PRN

## 2020-11-24 MED ORDER — INSULIN DETEMIR 100 UNIT/ML ~~LOC~~ SOLN
6.0000 [IU] | Freq: Every day | SUBCUTANEOUS | Status: DC
  Administered 2020-11-24: 6 [IU] via SUBCUTANEOUS
  Filled 2020-11-24 (×3): qty 0.06

## 2020-11-24 MED ORDER — ASPIRIN EC 81 MG PO TBEC
81.0000 mg | DELAYED_RELEASE_TABLET | Freq: Every day | ORAL | Status: DC
  Administered 2020-11-25 – 2020-11-27 (×3): 81 mg via ORAL
  Filled 2020-11-24 (×3): qty 1

## 2020-11-24 MED ORDER — METOPROLOL TARTRATE 100 MG PO TABS
100.0000 mg | ORAL_TABLET | Freq: Two times a day (BID) | ORAL | Status: DC
  Administered 2020-11-24 – 2020-11-27 (×6): 100 mg via ORAL
  Filled 2020-11-24 (×6): qty 1

## 2020-11-24 MED ORDER — INSULIN REGULAR(HUMAN) IN NACL 100-0.9 UT/100ML-% IV SOLN
INTRAVENOUS | Status: DC
  Administered 2020-11-24: 9.5 [IU]/h via INTRAVENOUS
  Filled 2020-11-24: qty 100

## 2020-11-24 MED ORDER — SODIUM CHLORIDE 0.9 % IV SOLN
1.0000 g | Freq: Once | INTRAVENOUS | Status: AC
  Administered 2020-11-24: 1 g via INTRAVENOUS
  Filled 2020-11-24: qty 10

## 2020-11-24 MED ORDER — DEXTROSE IN LACTATED RINGERS 5 % IV SOLN: INTRAVENOUS | Status: DC

## 2020-11-24 MED ORDER — LACTATED RINGERS IV SOLN: INTRAVENOUS | Status: DC

## 2020-11-24 MED ORDER — PANTOPRAZOLE SODIUM 40 MG PO TBEC
40.0000 mg | DELAYED_RELEASE_TABLET | Freq: Every day | ORAL | Status: DC
  Administered 2020-11-25 – 2020-11-27 (×3): 40 mg via ORAL
  Filled 2020-11-24 (×3): qty 1

## 2020-11-24 MED ORDER — ACETAMINOPHEN 325 MG PO TABS
650.0000 mg | ORAL_TABLET | Freq: Four times a day (QID) | ORAL | Status: DC | PRN
  Administered 2020-11-26: 650 mg via ORAL
  Filled 2020-11-24: qty 2

## 2020-11-24 MED ORDER — INSULIN ASPART 100 UNIT/ML IJ SOLN
0.0000 [IU] | Freq: Three times a day (TID) | INTRAMUSCULAR | Status: DC
  Administered 2020-11-25: 7 [IU] via SUBCUTANEOUS
  Administered 2020-11-25: 3 [IU] via SUBCUTANEOUS
  Administered 2020-11-25: 5 [IU] via SUBCUTANEOUS
  Administered 2020-11-26: 2 [IU] via SUBCUTANEOUS
  Administered 2020-11-26: 3 [IU] via SUBCUTANEOUS
  Administered 2020-11-26: 7 [IU] via SUBCUTANEOUS
  Administered 2020-11-27: 5 [IU] via SUBCUTANEOUS
  Administered 2020-11-27: 9 [IU] via SUBCUTANEOUS

## 2020-11-24 MED ORDER — ACETAMINOPHEN 650 MG RE SUPP: 650.0000 mg | Freq: Four times a day (QID) | RECTAL | Status: DC | PRN

## 2020-11-24 MED ORDER — INSULIN ASPART 100 UNIT/ML IJ SOLN
0.0000 [IU] | Freq: Every day | INTRAMUSCULAR | Status: DC
  Administered 2020-11-25 – 2020-11-26 (×2): 2 [IU] via SUBCUTANEOUS

## 2020-11-24 MED ORDER — POTASSIUM CHLORIDE CRYS ER 20 MEQ PO TBCR
40.0000 meq | EXTENDED_RELEASE_TABLET | Freq: Once | ORAL | Status: AC
  Administered 2020-11-24: 40 meq via ORAL
  Filled 2020-11-24: qty 2

## 2020-11-24 MED ORDER — ALLOPURINOL 100 MG PO TABS
100.0000 mg | ORAL_TABLET | Freq: Two times a day (BID) | ORAL | Status: DC
  Administered 2020-11-24 – 2020-11-27 (×6): 100 mg via ORAL
  Filled 2020-11-24 (×7): qty 1

## 2020-11-24 MED ORDER — KCL IN DEXTROSE-NACL 10-5-0.45 MEQ/L-%-% IV SOLN
INTRAVENOUS | Status: AC
  Filled 2020-11-24: qty 1000

## 2020-11-24 MED ORDER — SODIUM CHLORIDE 0.9 % IV SOLN
2.0000 g | INTRAVENOUS | Status: DC
  Administered 2020-11-25 – 2020-11-26 (×2): 2 g via INTRAVENOUS
  Filled 2020-11-24 (×2): qty 20

## 2020-11-24 MED ORDER — LEVETIRACETAM ER 500 MG PO TB24
500.0000 mg | ORAL_TABLET | Freq: Every day | ORAL | Status: DC
  Administered 2020-11-25 – 2020-11-27 (×3): 500 mg via ORAL
  Filled 2020-11-24 (×3): qty 1

## 2020-11-24 NOTE — Discharge Instructions (Signed)
Patient is advised to go to the emergency department She will need IV fluids and further work-up She may need hospitalization

## 2020-11-24 NOTE — Progress Notes (Signed)
Update: Notified by patient's nurse that last several POC glucose readings have been between 150-200.  Consequently, I have initiated transition from insulin drip order set, including order for Levemir 6 units subcu x 1 now in the setting of insulin naivety, with instructions to continue insulin drip for another 2 hours following dose of basal insulin before turning off insulin drip at that time following interval Q1 hour Accu-Cheks x2.  Rate of D5 half NS with 10 mEq/L KCl reduced to 100 cc/hr, and order further updated to stop these fluids completely after 2 more hours from now.  Additionally, I have ordered Accu-Cheks q. before meals and at bedtime with low-dose sliding scale insulin.  Additional evaluation and management of presenting hyperglycemia, as further detailed in my H&P.     Babs Bertin, DO Hospitalist

## 2020-11-24 NOTE — TOC Initial Note (Addendum)
Transition of Care [facility]) - Initial/Assessment Note    Patient Details  Name: [name] MRN: [id number] Date of Birth: 12-Jul-1929  Transition of [facility]) CM/SW Contact:    Verdell Carmine, RN Phone Number: 11/24/2020, 5:13 PM  Clinical Narrative:                 Patient admitted for hyperglucemia. Has history of DM controlled with oral agents up until the PCP discontinued them one month ago. Has high chance of readmission, as she signed out AMA last time she was admitted. Started on insulin drip with conversion to po for DC. May need endocrinology to monitor patient more closely. Lives with daughter has Public affairs consultant. CM will follow for needs.   Expected Discharge Plan: Home/Self Care Barriers to Discharge: Continued Medical Work up   Patient Goals and CMS Choice        Expected Discharge Plan and Services Expected Discharge Plan: Home/Self Care       Living arrangements for the past 2 months: Single Family Home                                      Prior Living Arrangements/Services Living arrangements for the past 2 months: Single Family Home Lives with:: Adult Children Patient language and need for interpreter reviewed:: Yes        Need for Family Participation in Patient Care: Yes (Comment) Care giver support system in place?: Yes (comment)   Criminal Activity/Legal Involvement Pertinent to Current Situation/Hospitalization: No - Comment as needed  Activities of Daily Living Home Assistive Devices/Equipment: Cane (specify quad or straight) (straight cane, only uses sometimes) ADL Screening (condition at time of admission) Patient's cognitive ability adequate to safely complete daily activities?: Yes Is the patient deaf or have difficulty hearing?: Yes Does the patient have difficulty seeing, even when wearing glasses/contacts?: No Does the patient have difficulty concentrating, remembering, or making decisions?: No Patient able to express need  for assistance with ADLs?: Yes Does the patient have difficulty dressing or bathing?: No Independently performs ADLs?: Yes (appropriate for developmental age) Does the patient have difficulty walking or climbing stairs?: No Weakness of Legs: Both Weakness of Arms/Hands: None  Permission Sought/Granted                  Emotional Assessment       Orientation: : Fluctuating Orientation (Suspected and/or reported Sundowners) Alcohol / Substance Use: Not Applicable Psych Involvement: No (comment)  Admission diagnosis:  Hyperglycemia [R73.9] HHNC (hyperglycemic hyperosmolar nonketotic coma) ([place]) [E11.01] AKI (acute kidney injury) ([place]) [N17.9] Urinary tract infection without hematuria, site unspecified [N39.0] Hyperglycemia due to diabetes mellitus ([place]) [E11.65] Patient Active Problem List   Diagnosis Date Noted  . HHNC (hyperglycemic hyperosmolar nonketotic coma) ([place]) 11/24/2020  . Hyperglycemia 11/24/2020  . Acute lower UTI 11/24/2020  . Acute metabolic encephalopathy 87/56/4332  . Hypokalemia 11/24/2020  . AKI (acute kidney injury) ([place]) 11/24/2020  . GERD (gastroesophageal reflux disease)   . Hypertension    PCP:  Seward Carol, MD Pharmacy:   [facility] [place] - [address] [place] [address] Phone: [phone] Fax: [phone]     Social Determinants of Health (SDOH) Interventions    Readmission Risk Interventions No flowsheet data found.

## 2020-11-24 NOTE — ED Provider Notes (Signed)
[facility]    CSN: [id number] Arrival date & time: 11/24/20  6433      History   Chief Complaint Chief Complaint  Patient presents with  . Hyperglycemia    HPI [name] is a 87 y.o. female comes to the urgent care upon the advice of her primary care.  Patient has been unwell and weak over the past couple of weeks.  Her blood sugars have been elevated at home and since Glucovance was discontinued a few weeks ago.  She went in to see a primary care physician who did some basic lab work.  She was called yesterday to go and get evaluated further because of lab abnormalities.  Patient went to the emergency room yesterday but left before she was evaluated.  She comes to the urgent care this morning to be evaluated further.  Blood sugars on BMP was 814 and a repeat today was 555.  Patient looks lethargic, weak and most of the history was taken from family member accompanying the patient.  She has had some frequency of micturition without dysuria.  No shortness of breath, cough or sputum production.  No vomiting or diarrhea.   HPI  Past Medical History:  Diagnosis Date  . Dementia ([place])   . Diabetes mellitus   . GERD (gastroesophageal reflux disease)   . Glaucoma   . Heart disease   . Hypertension   . Tinnitus     Patient Active Problem List   Diagnosis Date Noted  . [place] (hyperglycemic hyperosmolar nonketotic coma) ([place]) 11/24/2020    Past Surgical History:  Procedure Laterality Date  . BREAST LUMPECTOMY     bil for breast ca    OB History   No obstetric history on file.      Home Medications    Prior to Admission medications   Medication Sig Start Date End Date Taking? Authorizing Provider  glyBURIDE-metformin (GLUCOVANCE) 2.5-500 MG per tablet Take 2 tablets by mouth 2 (two) times daily with a meal. Patient not taking: Reported on 11/24/2020   Yes [provider]  allopurinol (ZYLOPRIM) 100 MG tablet Take 100 mg by mouth 2 (two) times daily.     [provider]  amLODipine (NORVASC) 10 MG tablet Take 10 mg by mouth daily.    [provider]  aspirin 81 MG tablet Take 81 mg by mouth daily.    [provider]  augmented betamethasone dipropionate (DIPROLENE-AF) 0.05 % cream Apply 1 application topically daily as needed for dry skin. 10/06/20   [provider]  irbesartan-hydrochlorothiazide (AVALIDE) 150-12.5 MG tablet Take 2 tablets by mouth daily. 10/10/20   [provider]  levETIRAcetam (KEPPRA XR) 500 MG 24 hr tablet TAKE TWO TABLETS BY MOUTH AT BEDTIME Patient taking differently: Take 500 mg by mouth daily. Morning    Marcial Pacas, MD  loratadine (CLARITIN) 10 MG tablet Take 10 mg by mouth daily as needed for allergies.    [provider]  meloxicam (MOBIC) 15 MG tablet Take 15 mg by mouth daily as needed (for arthritic pain).    [provider]  metoprolol tartrate (LOPRESSOR) 100 MG tablet Take 100 mg by mouth 2 (two) times daily. 08/03/20   [provider]  mirtazapine (REMERON) 15 MG tablet Take 15 mg by mouth at bedtime.     [provider]  omeprazole (PRILOSEC) 20 MG capsule Take 20 mg by mouth daily.    [provider]  predniSONE (DELTASONE) 20 MG tablet 3  tabs po day one, then 2 tabs daily x 4 days Patient not taking: No sig reported 08/22/14   Charlesetta Shanks, MD  albuterol (PROVENTIL HFA;VENTOLIN HFA) 108 (90 BASE) MCG/ACT inhaler Inhale 2 puffs into the lungs every 2 (two) hours as needed for wheezing or shortness of breath (cough). Patient not taking: Reported on 07/29/2017 08/22/14 11/24/20  Charlesetta Shanks, MD    Family History History reviewed. No pertinent family history.  Social History Social History   Tobacco Use  . Smoking status: Former Research scientist (life sciences)  . Smokeless tobacco: Never Used  Substance Use Topics  . Alcohol use: No    Comment: drinks caffeine daily  . Drug use: No     Allergies   Ace inhibitors, Dilaudid  [hydromorphone hcl], Latex, and Tape   Review of Systems Review of Systems  Unable to perform ROS: Acuity of condition     Physical Exam Triage Vital Signs ED Triage Vitals  Enc Vitals Group     BP 11/24/20 0854 113/62     Pulse Rate 11/24/20 0854 74     Resp 11/24/20 0854 18     Temp 11/24/20 0854 98.5 F (36.9 C)     Temp Source 11/24/20 0854 Oral     SpO2 11/24/20 0854 97 %     Weight --      Height --      Head Circumference --      Peak Flow --      Pain Score 11/24/20 0851 0     Pain Loc --      Pain Edu? --      Excl. in [place]? --    No data found.  Updated Vital Signs BP (!) 147/68 (BP Location: Left Arm)   Pulse 77   Temp 98.1 F (36.7 C) (Oral)   Resp 16   SpO2 95%   Visual Acuity Right Eye Distance:   Left Eye Distance:   Bilateral Distance:    Right Eye Near:   Left Eye Near:    Bilateral Near:     Physical Exam Vitals and nursing note reviewed.  Constitutional:      General: She is in acute distress.     Appearance: She is ill-appearing.  Cardiovascular:     Rate and Rhythm: Normal rate and regular rhythm.  Pulmonary:     Effort: Pulmonary effort is normal.     Breath sounds: Normal breath sounds.  Abdominal:     General: Abdomen is flat. Bowel sounds are normal.  Musculoskeletal:        General: Normal range of motion.  Skin:    Capillary Refill: Capillary refill takes less than 2 seconds.  Neurological:     General: No focal deficit present.     Mental Status: She is oriented to person, place, and time.  Psychiatric:        Mood and Affect: Mood normal.      UC Treatments / Results  Labs (all labs ordered are listed, but only abnormal results are displayed) Labs Reviewed  CBG MONITORING, ED - Abnormal; Notable for the following components:      Result Value   Glucose-Capillary 555 (*)    All other components within normal limits    EKG   Radiology CT Head Wo Contrast  Result Date: 11/23/2020 CLINICAL DATA:  Mental  status changes without known cause. EXAM: CT HEAD WITHOUT CONTRAST TECHNIQUE: Contiguous axial images were obtained from the base of the skull through the vertex without  intravenous contrast. COMPARISON:  July 29, 2017 FINDINGS: Brain: No evidence of acute infarction, hemorrhage, hydrocephalus, extra-axial collection or mass lesion/mass effect. Signs of atrophy and chronic microvascular ischemic changes. Vascular: No hyperdense vessel or unexpected calcification. Skull: Normal. Negative for fracture or focal lesion. Calvarial hyperostosis with similar appearance. Areas of dural calcification are unchanged. Sinuses/Orbits: Visualized paranasal sinuses and orbits are unremarkable. Other: None IMPRESSION: 1. No acute intracranial pathology. 2. Signs of atrophy and chronic microvascular ischemic changes similar to the prior study. Electronically Signed   By: Zetta Bills M.D.   On: 11/23/2020 21:00    Procedures Procedures (including critical care time)  Medications Ordered in UC Medications - No data to display  Initial Impression / Assessment and Plan / UC Course  I have reviewed the triage vital signs and the nursing notes.  Pertinent labs & imaging results that were available during my care of the patient were reviewed by me and considered in my medical decision making (see chart for details).     1.  Acute metabolic encephalopathy secondary to UTI/AKI: Patient is advised to go to the emergency department to be evaluated further.  She will need hospitalization.  Patient's family member agrees to take her to the hospital for further evaluation and admission for inpatient care.  I reviewed the medical records from the ED visit on 11/23/2020. Final Clinical Impressions(s) / UC Diagnoses   Final diagnoses:  Acute kidney injury ([place])  Acute cystitis with hematuria  DM (diabetes mellitus) type 2, uncontrolled, with ketoacidosis ([place])     Discharge Instructions     Patient is advised to go  to the emergency department She will need IV fluids and further work-up She may need hospitalization   ED Prescriptions    None     PDMP not reviewed this encounter.   Chase Picket, MD 11/24/20 1504

## 2020-11-24 NOTE — ED Notes (Signed)
Patient is being discharged from the Urgent Care and sent to the Emergency Department via POV . Per Dr Lanny Cramp, patient is in need of higher level of care due to hyperglycemia and slurred speach . Patient is aware and verbalizes understanding of plan of care.  Vitals:   11/24/20 0854  BP: 113/62  Pulse: 74  Resp: 18  Temp: 98.5 F (36.9 C)  SpO2: 97%

## 2020-11-24 NOTE — H&P (Signed)
History and Physical    PLEASE NOTE THAT [employer]AGON DI[employer] SOFTWARE WAS USED IN THE CONSTRUCTION OF THIS NOTE.   [name] OBS:[id number] DOB: [birth year] DOA: 11/24/2020  PCP: Seward Carol, MD Patient coming from: home   I have personally briefly reviewed patient's old medical records in [place]  Chief Complaint: Elevated blood sugar  HPI: [name] is a 87 y.o. female with medical history significant for type 2 diabetes mellitus, hypertension, stage IIIb chronic kidney disease with baseline creatinine around 1.6,  who is admitted to [facility] on 11/24/2020 with hyperglycemia after presenting from home to [facility] ED complaining of elevated blood sugar.   The following history is provided by the patient, my discussions with the patient's daughter, who is present at bedside, in addition to my discussions with the emergency department physician and via chart review.  The patient and her daughter report that the patient's PCP discontinued her metformin and glyburide approximately 1 month ago in the setting of perception of well-controlled blood sugars at that time.  They report no known hypoglycemia as a precipitating factor leading to the discontinuation of these 2 oral hypoglycemic agents.  Given the discontinuation of the patient's metformin and glyburide 1 month ago, she is subsequently on no pharmacologic diabetic management as an outpatient, including no insulin or additional oral hypoglycemic agents.  She does not routinely check her blood sugar at home, therefore unsure how her blood sugar has been running over the course of the last month following discontinuation of her oral hypoglycemic medications.  The patient was reportedly on subcutaneous insulin for a brief period of time 3 to 4 years ago.  However, she did not like checking her blood sugars at home, prompting the discontinuation of subcutaneous insulin at that time, prompting initiation of metformin/glyburide upon which  the patient remained until approximately 1 month ago.  Starting on 11/22/2020, patient's daughter noted the patient to be more lethargic and somnolent relative to her baseline mental status, noting her to require more sleep during the day relative to baseline requirements.  In this context, she was brought to her PCP on 11/23/2020, at which time she was told that her blood sugar was elevated, prompting PCP to recommend patient present to emergency department.  Patient compliantly presented to [facility] emergency department yesterday, at which time blood sugars were found to be elevated into the 800s.  Patient was started on IV fluids, and work-up at that time included noncontrast CT head which showed no evidence of acute intracranial process.  The patient was encouraged to remain in the hospital for additional IV fluids, IV insulin, and monitoring of ensuing blood sugars as well as associated electrolytes.  However, she subsequently left the hospital AMA last evening prior to admission.   She presented to local urgent care earlier this morning, at which time her blood sugar was once again found to be elevated, prompting her to be instructed to return back to the emergency department for further evaluation and management of this finding.  Consequently, the patient returns to [facility] emergency department this morning for further evaluation management of her elevated blood sugar.  Following initiation of IV fluids and insulin drip in the ED today, the patient's daughter feels that the patient lethargy/somnolence has resolved, with ensuing return to baseline mental status.  The patient reports dysuria over the last 3 to 4 days, in the absence of any gross hematuria or change in urinary urgency/frequency.  Denies any associated subjective fever,  chills, rigors, or generalized myalgias.  Denies any recent chest pain, shortness of breath, diaphoresis, palpitations, presyncope, or syncope.  She also denies any recent  cough, nausea, vomiting, abdominal pain, diarrhea, or rash.  No recent traveling or known COVID-19 exposures.  Denies any recent acute focal weakness, acute focal numbness, paresthesias, vertigo, dysarthria, slurred speech, or facial droop.   Per chart review, it appears that most recent hemoglobin A1c was found to be 6.5% when checked in 2010.   Per my discussions with both the patient and her daughter, they conveyed that the patient's daughter, who is her POA, is responsible for administering of all of the patient's home medications.      ED Course:  Vital signs in the ED were notable for the following: Tetramex 98.4, heart rate 73-76; blood pressure 124/62 -155/66; respiratory 14-19; oxygen saturation 97 to 100% on room air.  Labs were notable for the following: Point-of-care glucose upon presentation to the ED this morning noted to be 562.  CMP notable for the following: Sodium 131, which corrects to 134 when taking into account concomitant hyperglycemia, potassium 3.2, bicarbonate 29, anion gap 14, BUN 29, creatinine 2.24 relative to most recent prior value of 1.57 in 2018.  CBC notable for white blood cell count 5200.  Urinalysis was notable for greater than 50 white blood cells, large leukocyte esterase, no squamous epithelial cells, negative ketones, was positive for hyaline cast, and demonstrated 100 protein.  Nasopharyngeal COVID-19 PCR was performed in the ED today, with result currently pending.  Blood cultures x2 and urine culture collected prior to initiation of antibiotics.  Presenting EKG, which was compared to most recent prior EKG from January 2019, showed sinus rhythm with bifascicular block, heart rate 77, T wave inversion in V1, which was unchanged from most recent prior, and no evidence of ST changes, including no evidence of ST elevation.  While in the ED, the following were administered: Rocephin 1 g IV x1, insulin drip initiated, and continuous IV fluids in the form of  lactated Ringer's 125 cc/h.    Review of Systems: As per HPI otherwise 10 point review of systems negative.   Past Medical History:  Diagnosis Date  . Dementia ([place])   . Diabetes mellitus   . GERD (gastroesophageal reflux disease)   . Glaucoma   . Heart disease   . Hypertension   . Tinnitus     Past Surgical History:  Procedure Laterality Date  . BREAST LUMPECTOMY     bil for breast ca    Social History:  reports that she has quit smoking. She has never used smokeless tobacco. She reports that she does not drink alcohol and does not use drugs.   Allergies  Allergen Reactions  . Ace Inhibitors Swelling and Rash  . Dilaudid [Hydromorphone Hcl] Other (See Comments)    Hallucinations (auditory and visual)  . Latex Rash  . Tape Rash    Prefers PAPER TAPE, PLEASE!!    History reviewed. No pertinent family history.    Prior to Admission medications   Medication Sig Start Date End Date Taking? Authorizing Provider  allopurinol (ZYLOPRIM) 100 MG tablet Take 100 mg by mouth 2 (two) times daily.   Yes [provider]  amLODipine (NORVASC) 10 MG tablet Take 10 mg by mouth daily.   Yes [provider]  aspirin 81 MG tablet Take 81 mg by mouth daily.   Yes [provider]  augmented betamethasone dipropionate (DIPROLENE-AF) 0.05 % cream Apply 1 application  topically daily as needed for dry skin. 10/06/20  Yes [provider]  irbesartan-hydrochlorothiazide (AVALIDE) 150-12.5 MG tablet Take 2 tablets by mouth daily. 10/10/20  Yes [provider]  levETIRAcetam (KEPPRA XR) 500 MG 24 hr tablet TAKE TWO TABLETS BY MOUTH AT BEDTIME Patient taking differently: Take 500 mg by mouth daily. Morning   Yes Marcial Pacas, MD  loratadine (CLARITIN) 10 MG tablet Take 10 mg by mouth daily as needed for allergies.   Yes [provider]  meloxicam (MOBIC) 15 MG tablet Take 15 mg by mouth daily as needed (for arthritic pain).   Yes [provider]  metoprolol tartrate (LOPRESSOR) 100 MG tablet Take 100 mg by mouth 2 (two) times daily. 08/03/20  Yes [provider]  mirtazapine (REMERON) 15 MG tablet Take 15 mg by mouth at bedtime.    Yes [provider]  omeprazole (PRILOSEC) 20 MG capsule Take 20 mg by mouth daily.   Yes [provider]  glyBURIDE-metformin (GLUCOVANCE) 2.5-500 MG per tablet Take 2 tablets by mouth 2 (two) times daily with a meal. Patient not taking: Reported on 11/24/2020    [provider]  predniSONE (DELTASONE) 20 MG tablet 3 tabs po day one, then 2 tabs daily x 4 days Patient not taking: No sig reported 08/22/14   Charlesetta Shanks, MD  albuterol (PROVENTIL HFA;VENTOLIN HFA) 108 (90 BASE) MCG/ACT inhaler Inhale 2 puffs into the lungs every 2 (two) hours as needed for wheezing or shortness of breath (cough). Patient not taking: Reported on 07/29/2017 08/22/14 11/24/20  Charlesetta Shanks, MD     Objective    Physical Exam: Vitals:   11/24/20 1300 11/24/20 1345 11/24/20 1400 11/24/20 1430  BP: 130/64 (!) 125/108 137/79 133/65  Pulse: 74 74 74 71  Resp: _0 Temp:      TempSrc:      SpO2: 97% 100% 100% 99%    General: appears to be stated age; alert, oriented Skin: warm, dry, no rash Head:  AT/[place] Mouth:  Oral mucosa membranes appear dry, normal dentition Neck: supple; trachea midline Heart:  RRR; did not appreciate any M/R/G Lungs: CTAB, did not appreciate any wheezes, rales, or rhonchi Abdomen: + BS; soft, ND, NT Vascular: 2+ pedal pulses b/l; 2+ radial pulses b/l Extremities: no peripheral edema, no muscle wasting Neuro: strength and sensation intact in upper and lower extremities b/l    Labs on Admission: I have personally reviewed following labs and imaging studies  CBC: Recent Labs  Lab 11/23/20 2025 11/24/20 1001 11/24/20 1303  WBC 5.9  --  5.2  NEUTROABS 4.4  --  3.7  HGB 12.7 14.6 13.2  HCT 38.9 43.0 40.0  MCV 90.7  --  88.7  PLT  258  --  458   Basic Metabolic Panel: Recent Labs  Lab 11/23/20 2025 11/24/20 1001 11/24/20 1303  NA 122* 126* 131*  K 3.8 3.6 3.2*  CL 82*  --  88*  CO2 28  --  29  GLUCOSE 814*  --  278*  BUN 28*  --  29*  CREATININE 2.33*  --  2.24*  CALCIUM 9.0  --  9.4  MG 1.7  --   --    GFR: Estimated Creatinine Clearance: 15.9 mL/min (A) (by C-G formula based on SCr of 2.24 mg/dL (H)). Liver Function Tests: Recent Labs  Lab 11/23/20 2025 11/24/20 1303  AST 17 21  ALT 15 16  ALKPHOS 265* 254*  BILITOT 0.4 0.3  PROT 7.7 7.7  ALBUMIN 2.9* 3.0*   No results for input(s): LIPASE, AMYLASE in the last 168 hours. No results for input(s): AMMONIA in the last 168 hours. Coagulation Profile: No results for input(s): INR, PROTIME in the last 168 hours. Cardiac Enzymes: No results for input(s): CKTOTAL, CKMB, CKMBINDEX, TROPONINI in the last 168 hours. BNP (last 3 results) No results for input(s): PROBNP in the last 8760 hours. HbA1C: No results for input(s): HGBA1C in the last 72 hours. CBG: Recent Labs  Lab 11/24/20 0936 11/24/20 1112 11/24/20 1146 11/24/20 1245 11/24/20 1354  GLUCAP 562* 504* 427* 264* 195*   Lipid Profile: No results for input(s): CHOL, HDL, LDLCALC, TRIG, CHOLHDL, LDLDIRECT in the last 72 hours. Thyroid Function Tests: No results for input(s): TSH, T4TOTAL, FREET4, T3FREE, THYROIDAB in the last 72 hours. Anemia Panel: No results for input(s): VITAMINB12, FOLATE, FERRITIN, TIBC, IRON, RETICCTPCT in the last 72 hours. Urine analysis:    Component Value Date/Time   COLORURINE YELLOW 11/24/2020 0935   APPEARANCEUR CLOUDY (A) 11/24/2020 0935   LABSPEC 1.014 11/24/2020 0935   PHURINE 5.0 11/24/2020 0935   GLUCOSEU >=500 (A) 11/24/2020 0935   HGBUR MODERATE (A) 11/24/2020 0935   BILIRUBINUR NEGATIVE 11/24/2020 0935   KETONESUR NEGATIVE 11/24/2020 0935   PROTEINUR 100 (A) 11/24/2020 0935   UROBILINOGEN 0.2 11/21/2008 1820   NITRITE NEGATIVE 11/24/2020  0935   LEUKOCYTESUR LARGE (A) 11/24/2020 0935    Radiological Exams on Admission: CT Head Wo Contrast  Result Date: 11/23/2020 CLINICAL DATA:  Mental status changes without known cause. EXAM: CT HEAD WITHOUT CONTRAST TECHNIQUE: Contiguous axial images were obtained from the base of the skull through the vertex without intravenous contrast. COMPARISON:  July 29, 2017 FINDINGS: Brain: No evidence of acute infarction, hemorrhage, hydrocephalus, extra-axial collection or mass lesion/mass effect. Signs of atrophy and chronic microvascular ischemic changes. Vascular: No hyperdense vessel or unexpected calcification. Skull: Normal. Negative for fracture or focal lesion. Calvarial hyperostosis with similar appearance. Areas of dural calcification are unchanged. Sinuses/Orbits: Visualized paranasal sinuses and orbits are unremarkable. Other: None IMPRESSION: 1. No acute intracranial pathology. 2. Signs of atrophy and chronic microvascular ischemic changes similar to the prior study. Electronically Signed   By: Zetta Bills M.D.   On: 11/23/2020 21:00     EKG: Independently reviewed, with result as described above.    Assessment/Plan   AVRY MONTELEONE is a 87 y.o. female with medical history significant for type 2 diabetes mellitus, hypertension, stage IIIb chronic kidney disease with baseline creatinine around 1.6,  who is admitted to [facility] on 11/24/2020 with hyperglycemia after presenting from home to [facility] ED complaining of elevated blood sugar.    Principal Problem:   Hyperglycemia Active Problems:   Acute lower UTI   Acute metabolic encephalopathy   Hypokalemia   AKI (acute kidney injury) ([place])   GERD (gastroesophageal reflux disease)   Hypertension     #) Hyperglycemia: In the setting of a documented history of type 2 diabetes mellitus, patient presents with hypoglycemia with initial blood sugars in the high 500s, in the absence of metabolic acidosis or elevated anion gap to  suggest accompanying DKA.  Less likely to represent [facility] NK given only moderately elevated blood sugar relative to typical thresholds associated with the later. Will add on serum osmolality to further evaluate this possibility.  This appears to coincide with suboptimal outpatient glycemic management in the context of recent discontinuation of metformin/glyburide, leaving the patient without any pharmacologic agents on  board to treat her type 2 diabetes mellitus, including insulin or additional oral hypoglycemic agents.  There may be additional physiologic stressor in the form of urinary tract infection contributing to the patient's hyperglycemia, although I suspect that the primary source for her elevated blood sugars is suboptimal glycemic management from a pharmacologic standpoint over the course of the last month.  It is currently unclear to me how well the patient's blood sugars have been controlled leading up to the discontinuation of metformin/glyburide, his most recent hemoglobin A1c in the EMR at this time was found to be 6.5% in 2010.  We will add on A1c level at this time, to further assess if the patient will need insulin moving forward or if she is likely to have adequate glycemic management via resumption of oral hypoglycemic medications.  Of note, the patient and her daughter are amenable to meeting with the diabetic educator, will place consult for this during current hospitalization.  Of note, presenting EKG shows no evidence of acute ischemic changes, and no additional source of underlying infection, aside from urinary tract infection, have been identified at this time.  Initial presenting labs of note include presenting serum potassium 3.2.  Given plan for ongoing insulin drip, with associated anticipation of intracellular shift of additional potassium, will immediately start the patient on supplemental IV potassium, as further described below, with plan for close monitoring of ensuing electrolytes  via every 4 hour BMPs, with next BMP to be checked in 2 hours from current time.      Plan: Insulin drip per protocol. Q1 hour Accuchecks. Q4H BMP's in order to monitor ensuing serum sodium, serum potassium levels.  Discontinue current IV fluids and start D5 half NS with 10 mEq/L KCl 125.  Add on serum magnesium level and repeat this morning.  Check serum phosphorus level.  Consult for diabetic educator has been placed.  Monitor on telemetry.  Add on hemoglobin A1c level.  Monitor strict I's and O's.  Check chest x-ray to further evaluate for any additional sources for presenting hyperglycemia.  Add on urinary drug screen.  Work-up and management of presenting UTI, as above.  Monitor results of screening nasopharyngeal COVID-19 PCR performed in the ED today.  Repeat CMP in the morning. CBC in the AM.  Add on serum osmolality, as above.       #) acute cystitis: Diagnosis on the basis of 3 to 4 days of dysuria, with presenting urinalysis consistent for urinary tract infection.  Of note, no SIRS criteria met at this time for the patient to be considered septic.  No evidence of hypotension.  Blood cultures x2 and urine sample were collected prior to initiation of Rocephin in the ED today.   Plan: Continue daily Rocephin.  Monitor for results of blood cultures x2 and urine culture.  Repeat CBC with differential in the morning.      #) Acute metabolic encephalopathy: Patient's daughter conveys that the patient exhibited approximately 2 to 3 days of somnolence and lethargy relative to her baseline now status, which is apparently improved back to baseline following improvement in blood sugar via initiation of insulin drip as well as IV fluids, strongly suggesting hyperglycemic contribution to her perceived acute encephalopathy, as well as contribution from dehydration due to likely auto diuresis in the setting of hyperglycemic gradient.  There may also be additional physiologic contributions stemming from  presenting urinary tract infection.  No additional metabolic concretions identified at this time, including no additional source of underlying infection.  Will check chest x-ray to further evaluate.  Of note, noncontrast CT of the head that was performed on 27 2022 showed no evidence of acute intracranial process, and no evidence of acute focal neurologic deficits to suggest acute ischemic CVA at this time.   Plan: Work-up and management of presenting hyperglycemia as well as acute cystitis, as above.  Add on urinary drug screen.  Check chest x-ray, as above.  Repeat CMP and CBC in the morning.      #) Hypokalemia: Presenting serum potassium noted to be 3.2, with potential further exacerbation of this in the setting of insulin drip, which may cause further intracellular shift of her potassium.  Immediately added supplemental potassium and IV fluids, as above, with repeat BMP ordered to be rechecked in 2 hours for close trending of this value.  Consider being more aggressive with IV supplementation potassium however, in the setting of acute kidney injury superimposed on stage IIIb CKD, elected to proceed with the above degree of  supplementation, with close and setting monitoring of serum potassium trend to dictate any need for ensuing modifications thereof.   Plan: Add on potassium chloride 10 mEq/L to existing IVF's. Repeat BMP in 2 hours, as above.  Add on serum magnesium level.  Monitor on telemetry.  While on the insulin drip, have also ordered every 4 hour BMP checks for further close trending of serum potassium level.  Further evaluation management to presenting acute kidney injury superimposed on CKD 3B, as further described below.     #) Acute kidney injury superimposed on stage IIIb chronic kidney disease: In the setting of stage IIIb CKD, with suspected baseline creatinine 1.6, presenting creatinine elevated at 2.24.  Suspect this is prerenal in nature in the setting of intravascular  patient as a result of likely recent auto diuresis due to hyperglycemic gradient.  Presenting urinalysis found to be positive for hyaline casts, which is supportive of this possibility.  There may also have been additional pharmacologic contribution stemming from outpatient irbesartan use.  Plan: IV fluids, as above.  Monitor strict I's and O's and daily weights.  Tempt avoid nephrotoxic agents.  Hold home irbesartan for now additionally, will hold home as needed Mobic until renal function improves.  Add on random urine sodium as well as random urine creatinine.  Repeat BMP in the morning.       #) Essential hypertension: Outpatient hypertensive regimen includes amlodipine, irbesartan, HCTZ, Lopressor.  In setting of acute kidney injury as well as dehydration, will hold irbesartan and HCTZ for now.  Systolic pressures noted to be in the 120's - 150's in the ED today.   Plan: Hold HCTZ and irbesartan, as above.  Resume amlodipine and Lopressor.  Close monitoring of ensuing blood pressure via routine vital signs.       #) GERD: On omeprazole as an outpatient.  Plan: Continue home PPI.     DVT prophylaxis: scd's  Code Status: Full code Family Communication: Case was discussed with the patient's daughter (POA), who was present at bedside. Disposition Plan: Per Rounding Team Consults called: none  Admission status: Observation; pcu     Of note, this patient was added by me to the following Admit List/Treatment Team: [email or web address].      PLEASE NOTE THAT DRAGON DICTATION SOFTWARE WAS USED IN THE CONSTRUCTION OF THIS NOTE.   [facility] Pager [phone] From 12PM - 8PM  Otherwise, please contact night-coverage  [email or web address] Password [facility]   11/24/2020, 3:03 PM

## 2020-11-24 NOTE — ED Provider Notes (Signed)
[facility] EMERGENCY DEPARTMENT Provider Note   CSN: [id number] Arrival date & time: 11/24/20  3151     History No chief complaint on file.   [name] is a 87 y.o. female.  87 year old female with history of dementia, DM, GERD, HTN brought in by daughter from home for change in mental status (lethargic, eating less). [name] was out of town visiting family, returned to daughter's care on Tuesday and daughter felt patient was not at baseline at that time. Patient went to her PCP yesterday, daughter notes foul smelling urine but PCP did not comment on her in clinic UA, called by PCP office for blood sugar in the 600s and came to the ER last night. Patient was seen by myself in triage MSE, labs and CT head obtained and patient left with her daughter after extensive wait. Patient went home, daughter gave her 2 metformin (previously dc by PCP), and water to drink, returned to UC this morning and was sent back to the ER for elevated blood sugar and abnormal labs obtained at last night's ER visit. Patient denies complaints today, denies recent falls.         Past Medical History:  Diagnosis Date  . Dementia ([place])   . Diabetes mellitus   . GERD (gastroesophageal reflux disease)   . Glaucoma   . Heart disease   . Hypertension   . Tinnitus     There are no problems to display for this patient.   Past Surgical History:  Procedure Laterality Date  . BREAST LUMPECTOMY     bil for breast ca     OB History   No obstetric history on file.     No family history on file.  Social History   Tobacco Use  . Smoking status: Former Research scientist (life sciences)  . Smokeless tobacco: Never Used  Substance Use Topics  . Alcohol use: No    Comment: drinks caffeine daily  . Drug use: No    Home Medications Prior to Admission medications   Medication Sig Start Date End Date Taking? Authorizing Provider  allopurinol (ZYLOPRIM) 100 MG tablet Take 100 mg by mouth 2 (two) times daily.    Yes [provider]  amLODipine (NORVASC) 10 MG tablet Take 10 mg by mouth daily.   Yes [provider]  aspirin 81 MG tablet Take 81 mg by mouth daily.   Yes [provider]  augmented betamethasone dipropionate (DIPROLENE-AF) 0.05 % cream Apply 1 application topically daily as needed for dry skin. 10/06/20  Yes [provider]  irbesartan-hydrochlorothiazide (AVALIDE) 150-12.5 MG tablet Take 2 tablets by mouth daily. 10/10/20  Yes [provider]  levETIRAcetam (KEPPRA XR) 500 MG 24 hr tablet TAKE TWO TABLETS BY MOUTH AT BEDTIME Patient taking differently: Take 500 mg by mouth daily. Morning   Yes Marcial Pacas, MD  loratadine (CLARITIN) 10 MG tablet Take 10 mg by mouth daily as needed for allergies.   Yes [provider]  meloxicam (MOBIC) 15 MG tablet Take 15 mg by mouth daily as needed (for arthritic pain).   Yes [provider]  metoprolol tartrate (LOPRESSOR) 100 MG tablet Take 100 mg by mouth 2 (two) times daily. 08/03/20  Yes [provider]  mirtazapine (REMERON) 15 MG tablet Take 15 mg by mouth at bedtime.    Yes [provider]  omeprazole (PRILOSEC) 20 MG capsule Take 20 mg by mouth daily.   Yes [provider]  glyBURIDE-metformin (GLUCOVANCE) 2.5-500  MG per tablet Take 2 tablets by mouth 2 (two) times daily with a meal. Patient not taking: Reported on 11/24/2020    [provider]  predniSONE (DELTASONE) 20 MG tablet 3 tabs po day one, then 2 tabs daily x 4 days Patient not taking: No sig reported 08/22/14   Charlesetta Shanks, MD  albuterol (PROVENTIL HFA;VENTOLIN HFA) 108 (90 BASE) MCG/ACT inhaler Inhale 2 puffs into the lungs every 2 (two) hours as needed for wheezing or shortness of breath (cough). Patient not taking: Reported on 07/29/2017 08/22/14 11/24/20  Charlesetta Shanks, MD    Allergies    Ace inhibitors, Dilaudid [hydromorphone hcl], Latex, and Tape  Review of Systems   Review of  Systems  Unable to perform ROS: Dementia  Constitutional: Positive for fatigue. Negative for fever.  Respiratory: Negative for cough and shortness of breath.   Cardiovascular: Negative for chest pain.  Gastrointestinal: Negative for abdominal pain, constipation, diarrhea, nausea and vomiting.  Endocrine: Positive for polydipsia and polyuria.  Genitourinary: Negative for dysuria.  Musculoskeletal: Negative for arthralgias and myalgias.  Skin: Negative for rash and wound.  Allergic/Immunologic: Positive for immunocompromised state.  Neurological: Negative for weakness.  Psychiatric/Behavioral: Positive for confusion.    Physical Exam Updated Vital Signs BP 137/79   Pulse 74   Temp 98.4 F (36.9 C) (Oral)   Resp 19   SpO2 100%   Physical Exam Vitals and nursing note reviewed.  Constitutional:      Appearance: Normal appearance.  HENT:     Head: Normocephalic and atraumatic.     Nose: Nose normal.     Mouth/Throat:     Mouth: Mucous membranes are dry.  Eyes:     Conjunctiva/sclera: Conjunctivae normal.  Cardiovascular:     Rate and Rhythm: Normal rate and regular rhythm.     Pulses: Normal pulses.     Heart sounds: Normal heart sounds.  Pulmonary:     Effort: Pulmonary effort is normal.     Breath sounds: Normal breath sounds.  Abdominal:     Palpations: Abdomen is soft.     Tenderness: There is no abdominal tenderness.  Musculoskeletal:     Cervical back: Neck supple.     Right lower leg: No edema.     Left lower leg: No edema.  Skin:    General: Skin is warm and dry.     Findings: No erythema or rash.  Neurological:     General: No focal deficit present.     Mental Status: She is alert. Mental status is at baseline.     Motor: No weakness.  Psychiatric:        Behavior: Behavior normal.     ED Results / Procedures / Treatments   Labs (all labs ordered are listed, but only abnormal results are displayed) Labs Reviewed  URINALYSIS, ROUTINE W REFLEX  MICROSCOPIC - Abnormal; Notable for the following components:      Result Value   APPearance CLOUDY (*)    Glucose, UA >=500 (*)    Hgb urine dipstick MODERATE (*)    Protein, ur 100 (*)    Leukocytes,Ua LARGE (*)    WBC, UA >50 (*)    Bacteria, UA FEW (*)    All other components within normal limits  COMPREHENSIVE METABOLIC PANEL - Abnormal; Notable for the following components:   Sodium 131 (*)    Potassium 3.2 (*)    Chloride 88 (*)    Glucose, Bld 278 (*)    BUN 29 (*)  Creatinine, Ser 2.24 (*)    Albumin 3.0 (*)    Alkaline Phosphatase 254 (*)    GFR, Estimated 20 (*)    All other components within normal limits  CBG MONITORING, ED - Abnormal; Notable for the following components:   Glucose-Capillary 562 (*)    All other components within normal limits  I-STAT VENOUS BLOOD GAS, ED - Abnormal; Notable for the following components:   pCO2, Ven 61.4 (*)    pO2, Ven 62.0 (*)    Bicarbonate 35.4 (*)    TCO2 37 (*)    Acid-Base Excess 8.0 (*)    Sodium 126 (*)    Calcium, Ion 1.09 (*)    All other components within normal limits  CBG MONITORING, ED - Abnormal; Notable for the following components:   Glucose-Capillary 504 (*)    All other components within normal limits  CBG MONITORING, ED - Abnormal; Notable for the following components:   Glucose-Capillary 427 (*)    All other components within normal limits  CBG MONITORING, ED - Abnormal; Notable for the following components:   Glucose-Capillary 264 (*)    All other components within normal limits  CBG MONITORING, ED - Abnormal; Notable for the following components:   Glucose-Capillary 195 (*)    All other components within normal limits  CULTURE, BLOOD (ROUTINE X 2)  CULTURE, BLOOD (ROUTINE X 2)  URINE CULTURE  RESP PANEL BY RT-PCR (FLU A&B, COVID) ARPGX2  CBC WITH DIFFERENTIAL/PLATELET    EKG EKG Interpretation  Date/Time:  Thursday November 24 2020 10:18:15 EDT Ventricular Rate:  77 PR Interval:  175 QRS  Duration: 160 QT Interval:  468 QTC Calculation: 530 R Axis:   -87 Text Interpretation: Sinus rhythm Consider left atrial enlargement RBBB and LAFB Unchanged from previous Confirmed by Lavenia Atlas [phone]) on 11/24/2020 11:26:33 AM   Radiology CT Head Wo Contrast  Result Date: 11/23/2020 CLINICAL DATA:  Mental status changes without known cause. EXAM: CT HEAD WITHOUT CONTRAST TECHNIQUE: Contiguous axial images were obtained from the base of the skull through the vertex without intravenous contrast. COMPARISON:  July 29, 2017 FINDINGS: Brain: No evidence of acute infarction, hemorrhage, hydrocephalus, extra-axial collection or mass lesion/mass effect. Signs of atrophy and chronic microvascular ischemic changes. Vascular: No hyperdense vessel or unexpected calcification. Skull: Normal. Negative for fracture or focal lesion. Calvarial hyperostosis with similar appearance. Areas of dural calcification are unchanged. Sinuses/Orbits: Visualized paranasal sinuses and orbits are unremarkable. Other: None IMPRESSION: 1. No acute intracranial pathology. 2. Signs of atrophy and chronic microvascular ischemic changes similar to the prior study. Electronically Signed   By: Zetta Bills M.D.   On: 11/23/2020 21:00    Procedures .Critical Care Performed by: Tacy Learn, PA-C Authorized by: Tacy Learn, PA-C   Critical care provider statement:    Critical care time (minutes):  45   Critical care was time spent personally by me on the following activities:  Discussions with consultants, evaluation of patient's response to treatment, examination of patient, ordering and performing treatments and interventions, ordering and review of laboratory studies, ordering and review of radiographic studies, pulse oximetry, re-evaluation of patient's condition, obtaining history from patient or surrogate and review of old charts     Medications Ordered in ED Medications  insulin regular, human (MYXREDLIN)  100 units/ 100 mL infusion (1.9 Units/hr Intravenous Rate/Dose Change 11/24/20 1358)  lactated ringers infusion ( Intravenous Stopped 11/24/20 1359)  dextrose 5 % in lactated ringers infusion ( Intravenous New Bag/Given 11/24/20  1401)  dextrose 50 % solution 0-50 mL (has no administration in time range)  cefTRIAXone (ROCEPHIN) 1 g in sodium chloride 0.9 % 100 mL IVPB (0 g Intravenous Stopped 11/24/20 1103)    ED Course  I have reviewed the triage vital signs and the nursing notes.  Pertinent labs & imaging results that were available during my care of the patient were reviewed by me and considered in my medical decision making (see chart for details).  Clinical Course as of 11/24/20 1413  Thu Nov 25, 7919  3049 87 year old female with change in mental status as above. Review of labs obtained last night in the ER, suspect UTI and hyperglycemia, AKI. Plan is for rocephin for her UTI, will obtain additional urine today and send for culture. Will plan for endotool for glucose management, corrected Na 133 last night. Ct head obtained last night, no acute findings.  [LM]  1323 Delay in CBC/CMP results, labs collected in triage were not received in the lab. Labs have been recollected and sent with plan for admission once available.  [LM]  1356 Glucose has improved to 195, with insulin drip and fluids.  Awaiting CMP to consult for admission. [LM]  1610 CMP has resulted, CO2 and anion gap WNL. Concern for AKI with Cr 2.24, GFR 20. UTI, given Rocephin.  [LM]  1410 Case discussed with Dr. Velia Meyer with [facility] who will consult for admission. [LM]    Clinical Course User Index [LM] Roque Lias   MDM Rules/Calculators/A&P                          Final Clinical Impression(s) / ED Diagnoses Final diagnoses:  Hyperglycemia due to diabetes mellitus ([place])  AKI (acute kidney injury) ([place])  Urinary tract infection without hematuria, site unspecified    Rx / DC Orders ED Discharge  Orders    None       Tacy Learn, PA-C 11/24/20 1413    Horton, Alvin Critchley, DO 11/24/20 1515

## 2020-11-24 NOTE — Progress Notes (Addendum)
Patient was admitted to [phone]. Daughter is at bedside. Patient has no complaints of pain. Alert & oriented to self and place. Patient's vital signs are stable. I have placed the patient on 2L O2 [place] (O2 85-90%). Only belongings at bedside are dentures (upper and lower) and clothing/shoes. I have assisted the patient in ordering her dinner and have instructed her on using her call bell and the fall risk precautions. Will continue to monitor CBG and titrate insulin gtt as directed by endotool.

## 2020-11-24 NOTE — ED Notes (Signed)
Pt's Sa02 was bouncing between 88%-93%. I placed pt on 2L 02 per [place]

## 2020-11-24 NOTE — ED Triage Notes (Signed)
Pt arrives from UC after leaving here without being seen last night, then going to UC, and being sent back here. Pt and family report hyperglycemia, lethargy, and polydipsia, although decreased PO intake.

## 2020-11-24 NOTE — ED Triage Notes (Signed)
Pt caregiver states the pt has been urinating more than usual and has been having high blood sugar. Caregiver states she has been lethargic. Caregiver states she has been having trouble keeping her awake. Caregiver states she does not have much concern about slurred speech.

## 2020-11-25 DIAGNOSIS — Z87891 Personal history of nicotine dependence: Secondary | ICD-10-CM | POA: Diagnosis not present

## 2020-11-25 DIAGNOSIS — E876 Hypokalemia: Secondary | ICD-10-CM | POA: Diagnosis present

## 2020-11-25 DIAGNOSIS — E1101 Type 2 diabetes mellitus with hyperosmolarity with coma: Secondary | ICD-10-CM | POA: Diagnosis present

## 2020-11-25 DIAGNOSIS — Z79899 Other long term (current) drug therapy: Secondary | ICD-10-CM | POA: Diagnosis not present

## 2020-11-25 DIAGNOSIS — F0390 Unspecified dementia without behavioral disturbance: Secondary | ICD-10-CM | POA: Diagnosis present

## 2020-11-25 DIAGNOSIS — R739 Hyperglycemia, unspecified: Secondary | ICD-10-CM

## 2020-11-25 DIAGNOSIS — N3000 Acute cystitis without hematuria: Secondary | ICD-10-CM | POA: Diagnosis present

## 2020-11-25 DIAGNOSIS — G9341 Metabolic encephalopathy: Secondary | ICD-10-CM | POA: Diagnosis present

## 2020-11-25 DIAGNOSIS — E1122 Type 2 diabetes mellitus with diabetic chronic kidney disease: Secondary | ICD-10-CM | POA: Diagnosis present

## 2020-11-25 DIAGNOSIS — Z885 Allergy status to narcotic agent status: Secondary | ICD-10-CM | POA: Diagnosis not present

## 2020-11-25 DIAGNOSIS — N1832 Chronic kidney disease, stage 3b: Secondary | ICD-10-CM | POA: Diagnosis present

## 2020-11-25 DIAGNOSIS — I1310 Hypertensive heart and chronic kidney disease without heart failure, with stage 1 through stage 4 chronic kidney disease, or unspecified chronic kidney disease: Secondary | ICD-10-CM | POA: Diagnosis present

## 2020-11-25 DIAGNOSIS — Z888 Allergy status to other drugs, medicaments and biological substances status: Secondary | ICD-10-CM | POA: Diagnosis not present

## 2020-11-25 DIAGNOSIS — N179 Acute kidney failure, unspecified: Secondary | ICD-10-CM | POA: Diagnosis present

## 2020-11-25 DIAGNOSIS — Z20822 Contact with and (suspected) exposure to covid-19: Secondary | ICD-10-CM | POA: Diagnosis present

## 2020-11-25 DIAGNOSIS — E860 Dehydration: Secondary | ICD-10-CM | POA: Diagnosis present

## 2020-11-25 DIAGNOSIS — H409 Unspecified glaucoma: Secondary | ICD-10-CM | POA: Diagnosis present

## 2020-11-25 DIAGNOSIS — I452 Bifascicular block: Secondary | ICD-10-CM | POA: Diagnosis present

## 2020-11-25 DIAGNOSIS — Z9119 Patient's noncompliance with other medical treatment and regimen: Secondary | ICD-10-CM | POA: Diagnosis not present

## 2020-11-25 DIAGNOSIS — Z7982 Long term (current) use of aspirin: Secondary | ICD-10-CM | POA: Diagnosis not present

## 2020-11-25 DIAGNOSIS — Z91040 Latex allergy status: Secondary | ICD-10-CM | POA: Diagnosis not present

## 2020-11-25 DIAGNOSIS — Z853 Personal history of malignant neoplasm of breast: Secondary | ICD-10-CM | POA: Diagnosis not present

## 2020-11-25 DIAGNOSIS — K219 Gastro-esophageal reflux disease without esophagitis: Secondary | ICD-10-CM | POA: Diagnosis present

## 2020-11-25 LAB — BASIC METABOLIC PANEL
Anion gap: 9 (ref 5–15)
BUN: 25 mg/dL — ABNORMAL HIGH (ref 8–23)
CO2: 30 mmol/L (ref 22–32)
Calcium: 8.8 mg/dL — ABNORMAL LOW (ref 8.9–10.3)
Chloride: 91 mmol/L — ABNORMAL LOW (ref 98–111)
Creatinine, Ser: 1.9 mg/dL — ABNORMAL HIGH (ref 0.44–1.00)
GFR, Estimated: 25 mL/min — ABNORMAL LOW (ref >60)
Glucose, Bld: 262 mg/dL — ABNORMAL HIGH (ref 70–99)
Potassium: 3.7 mmol/L (ref 3.5–5.1)
Sodium: 130 mmol/L — ABNORMAL LOW (ref 135–145)

## 2020-11-25 LAB — COMPREHENSIVE METABOLIC PANEL
ALT: 13 U/L (ref 0–44)
AST: 19 U/L (ref 15–41)
Albumin: 2.7 g/dL — ABNORMAL LOW (ref 3.5–5.0)
Alkaline Phosphatase: 221 U/L — ABNORMAL HIGH (ref 38–126)
Anion gap: 11 (ref 5–15)
BUN: 26 mg/dL — ABNORMAL HIGH (ref 8–23)
CO2: 28 mmol/L (ref 22–32)
Calcium: 8.8 mg/dL — ABNORMAL LOW (ref 8.9–10.3)
Chloride: 90 mmol/L — ABNORMAL LOW (ref 98–111)
Creatinine, Ser: 1.81 mg/dL — ABNORMAL HIGH (ref 0.44–1.00)
GFR, Estimated: 26 mL/min — ABNORMAL LOW (ref >60)
Glucose, Bld: 239 mg/dL — ABNORMAL HIGH (ref 70–99)
Potassium: 3.7 mmol/L (ref 3.5–5.1)
Sodium: 129 mmol/L — ABNORMAL LOW (ref 135–145)
Total Bilirubin: 0.5 mg/dL (ref 0.3–1.2)
Total Protein: 6.6 g/dL (ref 6.5–8.1)

## 2020-11-25 LAB — GLUCOSE, CAPILLARY
Glucose-Capillary: 235 mg/dL — ABNORMAL HIGH (ref 70–99)
Glucose-Capillary: 243 mg/dL — ABNORMAL HIGH (ref 70–99)
Glucose-Capillary: 246 mg/dL — ABNORMAL HIGH (ref 70–99)
Glucose-Capillary: 260 mg/dL — ABNORMAL HIGH (ref 70–99)
Glucose-Capillary: 266 mg/dL — ABNORMAL HIGH (ref 70–99)
Glucose-Capillary: 331 mg/dL — ABNORMAL HIGH (ref 70–99)

## 2020-11-25 LAB — MRSA PCR SCREENING: MRSA by PCR: NEGATIVE

## 2020-11-25 LAB — CBC
HCT: 35.8 % — ABNORMAL LOW (ref 36.0–46.0)
Hemoglobin: 12 g/dL (ref 12.0–15.0)
MCH: 29.6 pg (ref 26.0–34.0)
MCHC: 33.5 g/dL (ref 30.0–36.0)
MCV: 88.2 fL (ref 80.0–100.0)
Platelets: 226 10*3/uL (ref 150–400)
RBC: 4.06 MIL/uL (ref 3.87–5.11)
RDW: 13.4 % (ref 11.5–15.5)
WBC: 6.3 10*3/uL (ref 4.0–10.5)
nRBC: 0 % (ref 0.0–0.2)

## 2020-11-25 LAB — URINE CULTURE

## 2020-11-25 LAB — MAGNESIUM
Magnesium: 1.6 mg/dL — ABNORMAL LOW (ref 1.7–2.4)
Magnesium: 1.5 mg/dL — ABNORMAL LOW (ref 1.7–2.4)

## 2020-11-25 LAB — BRAIN NATRIURETIC PEPTIDE: B Natriuretic Peptide: 35.8 pg/mL (ref 0.0–100.0)

## 2020-11-25 LAB — PHOSPHORUS: Phosphorus: 2.9 mg/dL (ref 2.5–4.6)

## 2020-11-25 MED ORDER — INSULIN STARTER KIT- PEN NEEDLES (ENGLISH)
1.0000 | Freq: Once | Status: DC
  Filled 2020-11-25 (×2): qty 1

## 2020-11-25 MED ORDER — POTASSIUM CHLORIDE 2 MEQ/ML IV SOLN
INTRAVENOUS | Status: DC
  Filled 2020-11-25 (×2): qty 1000

## 2020-11-25 MED ORDER — HEPARIN SODIUM (PORCINE) 5000 UNIT/ML IJ SOLN
5000.0000 [IU] | Freq: Three times a day (TID) | INTRAMUSCULAR | Status: DC
  Administered 2020-11-25 – 2020-11-26 (×5): 5000 [IU] via SUBCUTANEOUS
  Filled 2020-11-25 (×5): qty 1

## 2020-11-25 MED ORDER — INSULIN DETEMIR 100 UNIT/ML ~~LOC~~ SOLN
12.0000 [IU] | Freq: Every day | SUBCUTANEOUS | Status: DC
  Administered 2020-11-25: 12 [IU] via SUBCUTANEOUS
  Filled 2020-11-25 (×2): qty 0.12

## 2020-11-25 MED ORDER — INSULIN DETEMIR 100 UNIT/ML ~~LOC~~ SOLN
10.0000 [IU] | Freq: Two times a day (BID) | SUBCUTANEOUS | Status: DC
  Administered 2020-11-25: 10 [IU] via SUBCUTANEOUS
  Filled 2020-11-25 (×3): qty 0.1

## 2020-11-25 MED ORDER — LACTATED RINGERS IV SOLN: INTRAVENOUS | Status: DC

## 2020-11-25 MED ORDER — MAGNESIUM SULFATE 2 GM/50ML IV SOLN
2.0000 g | Freq: Once | INTRAVENOUS | Status: AC
  Administered 2020-11-25: 2 g via INTRAVENOUS

## 2020-11-25 MED ORDER — GLUCERNA SHAKE PO LIQD
237.0000 mL | Freq: Three times a day (TID) | ORAL | Status: DC
  Administered 2020-11-27: 237 mL via ORAL

## 2020-11-25 MED ORDER — ADULT MULTIVITAMIN W/MINERALS CH
1.0000 | ORAL_TABLET | Freq: Every day | ORAL | Status: DC
  Administered 2020-11-25 – 2020-11-27 (×3): 1 via ORAL
  Filled 2020-11-25 (×3): qty 1

## 2020-11-25 NOTE — Progress Notes (Signed)
SATURATION QUALIFICATIONS: (This note is used to comply with regulatory documentation for home oxygen)  Patient Saturations on Room Air at Rest = 89%  Patient Saturations on Room Air while Ambulating = 85%  Patient Saturations on 2 Liters of oxygen while Ambulating = 94%

## 2020-11-25 NOTE — Progress Notes (Signed)
Freestyle Libre CGM placed on patient on right upper arm. This is to be used strictly for home use when discharged possibly tomorrow. Daughter instructed on how to use.   Daughter also instructed on how to use insulin pen. Returned demonstration of use of insulin pen without issues.   Harvel Ricks RN BSN CDE Diabetes Coordinator Pager: [phone]  8am-5pm

## 2020-11-25 NOTE — Evaluation (Signed)
Occupational Therapy Evaluation Patient Details Name: [name] MRN: [id number] DOB: [birth year] Today's Date: 11/25/2020    History of Present Illness 87 y.o. female who is admitted to [facility] on 11/24/2020 with hyperglycemia after presenting from home to [facility] ED complaining of elevated blood sugar. PMH:  type 2 diabetes mellitus, hypertension, stage IIIb chronic kidney disease.   Clinical Impression   This 87 yo female admitted with above presents to acute OT with PLOF of being totally independent with her basic ADLs. Currently pt is at a min guard A level when up on her feet without an AD for basic ADLs. She will continue to benefit from acute OT with follow up [place]. We will continue to follow.     Follow Up Recommendations  Home health OT;Supervision/Assistance - 24 hour    Equipment Recommendations  Other (comment);3 in 1 bedside commode (rollator)       Precautions / Restrictions Precautions Precautions: Fall Precaution Comments: unsteady on her feet and decreased awareness of deficits; monitor O2 (on RA down to as low as 88% with activity in room) Restrictions Weight Bearing Restrictions: No      Mobility Bed Mobility Overal bed mobility: Needs Assistance Bed Mobility: Sit to Supine     Sit to supine: Min guard   General bed mobility comments: min guard for safety    Transfers Overall transfer level: Needs assistance Equipment used: None Transfers: Sit to/from Stand Sit to Stand: Min guard         General transfer comment: min guard for safety, increased effort for self steady    Balance Overall balance assessment: Needs assistance Sitting-balance support: No upper extremity supported;Feet supported Sitting balance-Leahy Scale: Good     Standing balance support: No upper extremity supported;During functional activity Standing balance-Leahy Scale: Fair Standing balance comment: standing at sink to wash hands                            ADL either performed or assessed with clinical judgement   ADL Overall ADL's : Needs assistance/impaired Eating/Feeding: Independent;Sitting Eating/Feeding Details (indicate cue type and reason): in recliner Grooming: Min guard;Standing;Wash/dry hands   Upper Body Bathing: Set up;Supervision/ safety;Sitting   Lower Body Bathing: Minimal assistance;Sit to/from stand   Upper Body Dressing : Set up;Supervision/safety;Sitting   Lower Body Dressing: Minimal assistance;Sit to/from stand   Toilet Transfer: Minimal assistance;Ambulation;Comfort height toilet;Grab bars Toilet Transfer Details (indicate cue type and reason): No AD for ambulation Toileting- Clothing Manipulation and Hygiene: Minimal assistance;Sit to/from stand Toileting - Clothing Manipulation Details (indicate cue type and reason): Did better (min guard A) sit<>stand from 3n1             Vision Patient Visual Report: No change from baseline Vision Assessment?: Yes Eye Alignment: Within Functional Limits Ocular Range of Motion: Within Functional Limits Alignment/Gaze Preference: Within Defined Limits Tracking/Visual Pursuits: Other (comment) (had a couple of times where she went "off track" with the tracking) Visual Fields: No apparent deficits            Pertinent Vitals/Pain Pain Assessment: No/denies pain     Hand Dominance Right   Extremity/Trunk Assessment Upper Extremity Assessment Upper Extremity Assessment: Overall WFL for tasks assessed     Communication Communication Communication: HOH   Cognition Arousal/Alertness: Awake/alert Behavior During Therapy: WFL for tasks assessed/performed Overall Cognitive Status: Impaired/Different from baseline Area of Impairment: Safety/judgement;Awareness;Problem solving  Safety/Judgement: Decreased awareness of safety;Decreased awareness of deficits Awareness: Emergent Problem Solving: Requires verbal cues;Requires tactile  cues                Home Living Family/patient expects to be discharged to:: Private residence Living Arrangements: Children [name]) Available Help at Discharge: Family;Personal care attendant;Available 24 hours/day Type of Home: House Home Access: Stairs to enter [employer] of Steps: 7 Entrance Stairs-Rails: Left Home Layout: Two level;Able to live on main level with bedroom/bathroom     Bathroom Shower/Tub: Occupational psychologist: Standard Bathroom Accessibility: Yes   Home Equipment: Wheelchair - manual;Cane - single point;Grab bars - tub/shower          Prior Functioning/Environment Level of Independence: Independent;Needs assistance  Gait / Transfers Assistance Needed: 3 weeks ago was able to go to church wearing heels without AD ADL's / Homemaking Assistance Needed: independent with bADL daughter provides for iADLs   Comments: was        OT Problem List: Impaired balance (sitting and/or standing)      OT Treatment/Interventions: Self-care/ADL training;DME and/or AE instruction;Patient/family education;Balance training    OT Goals(Current goals can be found in the care plan section) Acute Rehab OT Goals Patient Stated Goal: to go home and get [facility] therapies OT Goal Formulation: With patient/family Time For Goal Achievement: 12/09/20 Potential to Achieve Goals: Good  OT Frequency: Min 2X/week              AM-PAC OT "6 Clicks" Daily Activity     Outcome Measure Help from another person eating meals?: None Help from another person taking care of personal grooming?: A Little Help from another person toileting, which includes using toliet, bedpan, or urinal?: A Little Help from another person bathing (including washing, rinsing, drying)?: A Little Help from another person to put on and taking off regular upper body clothing?: A Little Help from another person to put on and taking off regular lower body clothing?: A Little 6 Click  Score: 19   End of Session Equipment Utilized During Treatment: Gait belt  Activity Tolerance: Patient tolerated treatment well Patient left: in bed;with call bell/phone within reach;Other (comment) (no bed alarm set at dtr's request since she was in room; she is aware to let staff know when she leaves so they can set bed alarm. O2 on 2liters)  OT Visit Diagnosis: Unsteadiness on feet (R26.81);Other abnormalities of gait and mobility (R26.89);Muscle weakness (generalized) (M62.81)                Time: 6269-4854 OT Time Calculation (min): 29 min Charges:  OT General Charges $OT Visit: 1 Visit OT Evaluation $OT Eval Moderate Complexity: 1 Mod OT Treatments $Self Care/Home Management : 8-22 mins  [place] [address], OTR/L Acute [employer] Pager [phone] Office [phone]     Almon Register 11/25/2020, 4:31 PM

## 2020-11-25 NOTE — Progress Notes (Signed)
PROGRESS NOTE                                                                                                                                                                                                             Patient Demographics:    [name], is a 87 y.o. female, DOB - July 16, 1929, IRS:[id number]  Outpatient Primary MD for the patient is Seward Carol, MD    LOS - 0  Admit date - 11/24/2020    No chief complaint on file.      Brief Narrative (HPI from H&P) - [name] is a 87 y.o. female with medical history significant for type 2 diabetes mellitus, hypertension, stage IIIb chronic kidney disease with baseline creatinine around 1.6,  who is admitted to [facility] on 11/24/2020 with hyperglycemia after presenting from home to [facility] ED complaining of elevated blood sugar.    Subjective:    [name] today has, No headache, No chest pain, No abdominal pain - No Nausea, No new weakness tingling or numbness, no SOB.   Assessment  & Plan :      1. DM2 with hyperglycemia  - started on Lantus + ISS, DM and Insulin education  Lab Results  Component Value Date   HGBA1C 12.5 (H) 11/24/2020   . CBG (last 3)  Recent Labs    11/25/20 0012 11/25/20 0248 11/25/20 0741  GLUCAP 266* 243* 260*    2. Acute Cystitis - Rocephin, follow cultures.  3.  Hypokalemia and hypomagnesemia.  Replaced.  4.  AKI on CKD stage IIIb.  Baseline creatinine around 1.6. IVF and monitor.  5. HTN - meds adjusted, monitor.  6. GERD - PPI.  7. Dementia with nighttime delirium at home.  Expect worsening, family educated, daughter wants to be involved if patient gets excessively confused does not follow         Condition - Fair  Family Communication  : Daughter Gelles [phone]  Bedside on 11/25/20  Code Status :  Full  Consults  :  None  PUD Prophylaxis : PPI   Procedures  :     CT head - non acute       Disposition Plan  :    Status is: Observation  Dispo: The patient is from: Home  Anticipated d/c is to: Home              Patient currently is not medically stable to d/c.   Difficult to place patient No  DVT Prophylaxis  :    SCDs Start: 11/24/20 1427  Heparin   Lab Results  Component Value Date   PLT 226 11/25/2020    Diet :  Diet Order            Diet Carb Modified Fluid consistency: Thin; Room service appropriate? Yes  Diet effective now                  Inpatient Medications  Scheduled Meds: . allopurinol  100 mg Oral BID  . amLODipine  10 mg Oral Daily  . aspirin EC  81 mg Oral Daily  . heparin injection (subcutaneous)  5,000 Units Subcutaneous Q8H  . insulin aspart  0-5 Units Subcutaneous QHS  . insulin aspart  0-9 Units Subcutaneous TID WC  . insulin detemir  10 Units Subcutaneous BID  . levETIRAcetam  500 mg Oral Daily  . metoprolol tartrate  100 mg Oral BID  . pantoprazole  40 mg Oral Daily   Continuous Infusions: . cefTRIAXone (ROCEPHIN)  IV 2 g (11/25/20 0830)  . lactated ringers with kcl    . magnesium sulfate bolus IVPB     PRN Meds:.acetaminophen **OR** [DISCONTINUED] acetaminophen, dextrose  Antibiotics  :    Anti-infectives (From admission, onward)   Start     Dose/Rate Route Frequency Ordered Stop   11/25/20 0800  cefTRIAXone (ROCEPHIN) 2 g in sodium chloride 0.9 % 100 mL IVPB        2 g 200 mL/hr over 30 Minutes Intravenous Every 24 hours 11/24/20 1453     11/24/20 1000  cefTRIAXone (ROCEPHIN) 1 g in sodium chloride 0.9 % 100 mL IVPB        1 g 200 mL/hr over 30 Minutes Intravenous  Once 11/24/20 1443 11/24/20 1103       Time Spent in minutes  30   Lala Lund M.D on 11/25/2020 at 11:13 AM  To page go to [email or web address]   [facility] -  Office  [phone]    See all Orders from today for further details    Objective:   Vitals:   11/24/20 2219 11/24/20 2338 11/25/20 0331 11/25/20 0734  BP: (!)  157/72 135/63 (!) 156/64 (!) 156/65  Pulse:  72 71 74  Resp:  18 19 16   Temp:  97.9 F (36.6 C) 98 F (36.7 C) 98.6 F (37 C)  TempSrc:  Axillary Oral Oral  SpO2:  97% 94% 98%    Wt Readings from Last 3 Encounters:  11/23/20 73 kg  08/22/14 81.2 kg  09/01/12 84.4 kg     Intake/Output Summary (Last 24 hours) at 11/25/2020 1113 Last data filed at 11/24/2020 2000 Gross per 24 hour  Intake 500 ml  Output --  Net 500 ml     Physical Exam  Awake but pleasantly confused, No new F.N deficits, Normal affect Whitney.AT,PERRAL Supple Neck,No JVD, No cervical lymphadenopathy appriciated.  Symmetrical Chest wall movement, Good air movement bilaterally, CTAB RRR,No Gallops,Rubs or new Murmurs, No Parasternal Heave +ve B.Sounds, Abd Soft, No tenderness, No organomegaly appriciated, No rebound - guarding or rigidity. No Cyanosis, Clubbing or edema, No new Rash or bruise      Data Review:    CBC Recent Labs  Lab 11/23/20 2025 11/24/20 1001 11/24/20 1303 11/25/20 0208  WBC  5.9  --  5.2 6.3  HGB 12.7 14.6 13.2 12.0  HCT 38.9 43.0 40.0 35.8*  PLT 258  --  232 226  MCV 90.7  --  88.7 88.2  MCH 29.6  --  29.3 29.6  MCHC 32.6  --  33.0 33.5  RDW 13.5  --  13.3 13.4  LYMPHSABS 0.8  --  0.8  --   MONOABS 0.5  --  0.4  --   EOSABS 0.3  --  0.2  --   BASOSABS 0.0  --  0.0  --     Recent Labs  Lab 11/23/20 2025 11/24/20 1001 11/24/20 1303 11/24/20 1634 11/24/20 2029 11/25/20 0208 11/25/20 0601 11/25/20 0754  NA 122*   < > 131* 129* 129* 129* 130*  --   K 3.8   < > 3.2* 3.6 2.7* 3.7 3.7  --   CL 82*  --  88* 87* 91* 90* 91*  --   CO2 28  --  29 30 28 28 30   --   GLUCOSE 814*  --  278* 366* 228* 239* 262*  --   BUN 28*  --  29* 26* 27* 26* 25*  --   CREATININE 2.33*  --  2.24* 2.04* 2.03* 1.81* 1.90*  --   CALCIUM 9.0  --  9.4 9.0 8.7* 8.8* 8.8*  --   AST 17  --  21  --   --  19  --   --   ALT 15  --  16  --   --  13  --   --   ALKPHOS 265*  --  254*  --   --  221*  --    --   BILITOT 0.4  --  0.3  --   --  0.5  --   --   ALBUMIN 2.9*  --  3.0*  --   --  2.7*  --   --   MG 1.7  --   --  1.7  --  1.6* 1.5*  --   HGBA1C  --   --   --  12.5*  --   --   --   --   BNP  --   --   --   --   --   --   --  35.8   < > = values in this interval not displayed.    ------------------------------------------------------------------------------------------------------------------ No results for input(s): CHOL, HDL, LDLCALC, TRIG, CHOLHDL, LDLDIRECT in the last 72 hours.  Lab Results  Component Value Date   HGBA1C 12.5 (H) 11/24/2020   ------------------------------------------------------------------------------------------------------------------ No results for input(s): TSH, T4TOTAL, T3FREE, THYROIDAB in the last 72 hours.  Invalid input(s): FREET3  Cardiac Enzymes No results for input(s): CKMB, TROPONINI, MYOGLOBIN in the last 168 hours.  Invalid input(s): CK ------------------------------------------------------------------------------------------------------------------    Component Value Date/Time   BNP 35.8 11/25/2020 0754    Micro Results Recent Results (from the past 240 hour(s))  Culture, blood (routine x 2)     Status: None (Preliminary result)   Collection Time: 11/24/20 10:02 AM   Specimen: BLOOD LEFT FOREARM  Result Value Ref Range Status   Specimen Description BLOOD LEFT FOREARM  Final   Special Requests   Final    BOTTLES DRAWN AEROBIC AND ANAEROBIC Blood Culture adequate volume   Culture   Final    NO GROWTH < 24 HOURS Performed at [facility], [place] [address], [place], [place] [address]  Report Status PENDING  Incomplete  Culture, blood (routine x 2)     Status: None (Preliminary result)   Collection Time: 11/24/20 10:02 AM   Specimen: BLOOD  Result Value Ref Range Status   Specimen Description BLOOD RIGHT ANTECUBITAL  Final   Special Requests   Final    BOTTLES DRAWN AEROBIC AND ANAEROBIC Blood Culture adequate volume    Culture   Final    NO GROWTH < 24 HOURS Performed at [facility], [address], [place], [place] [address]    Report Status PENDING  Incomplete  MRSA PCR Screening     Status: None   Collection Time: 11/25/20  7:58 AM   Specimen: Nasal Mucosa; Nasopharyngeal  Result Value Ref Range Status   MRSA by PCR NEGATIVE NEGATIVE Final    Comment:        The GeneXpert MRSA Assay (FDA approved for NASAL specimens only), is one component of a comprehensive MRSA colonization surveillance program. It is not intended to diagnose MRSA infection nor to guide or monitor treatment for MRSA infections. Performed at [facility], [place] [address] [place], [place] [address]     Radiology Reports CT Head Wo Contrast  Result Date: 11/23/2020 CLINICAL DATA:  Mental status changes without known cause. EXAM: CT HEAD WITHOUT CONTRAST TECHNIQUE: Contiguous axial images were obtained from the base of the skull through the vertex without intravenous contrast. COMPARISON:  July 29, 2017 FINDINGS: Brain: No evidence of acute infarction, hemorrhage, hydrocephalus, extra-axial collection or mass lesion/mass effect. Signs of atrophy and chronic microvascular ischemic changes. Vascular: No hyperdense vessel or unexpected calcification. Skull: Normal. Negative for fracture or focal lesion. Calvarial hyperostosis with similar appearance. Areas of dural calcification are unchanged. Sinuses/Orbits: Visualized paranasal sinuses and orbits are unremarkable. Other: None IMPRESSION: 1. No acute intracranial pathology. 2. Signs of atrophy and chronic microvascular ischemic changes similar to the prior study. Electronically Signed   By: Zetta Bills M.D.   On: 11/23/2020 21:00   DG CHEST PORT 1 VIEW  Result Date: 11/24/2020 CLINICAL DATA:  Hyperglycemia. EXAM: PORTABLE CHEST 1 VIEW COMPARISON:  August 31, 2014. FINDINGS: Stable cardiomegaly. No pneumothorax or pleural effusion is noted. Lungs are clear. Bony  thorax is unremarkable. IMPRESSION: No active disease. Aortic Atherosclerosis (ICD10-I70.0). Electronically Signed   By: Marijo Conception M.D.   On: 11/24/2020 15:39

## 2020-11-25 NOTE — TOC Benefit Eligibility Note (Signed)
Transition of Care [facility]) Benefit Eligibility Note    Patient Details  Name: [name] MRN: [id number] Date of Birth: 06-21-1929   Medication/Dose: Chevis Pretty  VAIL-  CO--PAY- $ 45.00   and   LANTUS SOLOSTARCOVER- YES  CO-PAY- $45.00 TIER-3 DRUG P/A-NO  Covered?: Yes  Tier: 3 Drug  Prescription Coverage Preferred Pharmacy: [facility] with Person/Company/Phone Number:: JOY  @ [employer] # [phone]  Co-Pay: $45.00   Prior Approval: No  Deductible:  (NO DEDUCTIBLE WITH PLAN)  Additional Notes: DEXTRON GLUCOSE MONITORING SYSTEM : COVER  and  PREFERRED BRAND : FREE STYL , ONE Rochel Brome Phone Number: 11/25/2020, 6:17 PM

## 2020-11-25 NOTE — Progress Notes (Addendum)
Spoke with daughter and patient about her diabetes. Was diagnosed about 25 years ago. Has been on Metformin and Glyburide, different dosages, over the years. Has recently not been eating well for about 1 month and has lost weight. The Metformin and Glyburide were stopped about 1 month ago due to decrease in HgbA1C that was close to 7% at that time.   Noted that HgbA1C is 12.5% now. Daughter states that patient does not like to have her fingers stuck to check blood sugars. A continuous glucose monitor might work for her so that she can check blood sugars without sticking fingers.  The daughter works in [place] during the day and the patient has been at home by herself in the past. The main goal is safety at home and patient eating better than she has over the last month.   Spoke with case manager about getting caregivers to help and to talk with the daughter about what would work best for her. Daughter very knowledgeable of community offerings, but does have a challenge working with her mother. Willing to do what needs to be done.   Recommend at home one low dose of basal insulin such as Lantus 10 units daily and perhaps Amaryl 1 mg daily. She does not need to be on Metformin or Glucovance due to her GFR of 25. She will need to follow up with her PCP after discharge.   Harvel Ricks RN BSN CDE Diabetes Coordinator Pager: [phone]  8am-5pm

## 2020-11-25 NOTE — Progress Notes (Signed)
Initial Nutrition Assessment  DOCUMENTATION CODES:   Not applicable  INTERVENTION:   -MVI with minerals daily -Glucerna Shake po TID, each supplement provides 220 kcal and 10 grams of protein  NUTRITION DIAGNOSIS:   Inadequate oral intake related to decreased appetite as evidenced by per patient/family report.  GOAL:   Patient will meet greater than or equal to 90% of their needs  MONITOR:   PO intake,Supplement acceptance,Labs,Weight trends,Skin,I & O's  REASON FOR ASSESSMENT:   Malnutrition Screening Tool    ASSESSMENT:   [name] is a 87 y.o. female with medical history significant for type 2 diabetes mellitus, hypertension, stage IIIb chronic kidney disease with baseline creatinine around 1.6,  who is admitted to [facility] on 11/24/2020 with hyperglycemia after presenting from home to [facility] ED complaining of elevated blood sugar.  Pt admitted with hyperglycemia.   Reviewed I/O's: +600 ml x 24 hours  Spoke with pt and daughter at bedside. Per daughter, pt has experienced a decreased appetite over the past month. Pt usually eats well and grazes at baseline, especially during night hours (ice cream, yogurt, etc). However, pt has had minimal desire to eat despite encouragement from her daughter. Over the past month, pt has been consuming one meal per day at best, usually a salad or sandwich that her daughter prepares and eats with her. Daughter also started giving pt Boost Glucose control supplements over the past week secondary to declining intake. Discussed importance of good meal and supplement intake to promote healing; amenable to Glucerna supplements.   Per daughter, pt has experienced bout a 6 pound weight loss over the past month.   Medications reviewed and include keppra and lactated ringers infusion @ 125 ml/hr.   Lab Results  Component Value Date   HGBA1C 12.5 (H) 11/24/2020   PTA DM medications are none. Discussed DM control at home with daughter, who  reports that pt is often resistant to CBGS and taking medications. Pt was prescribed the Freestyle Libre meter briefly in the past, which worked well for her. Prior to last month, DM was very well controlled per Hgb A1c. Pt daughter is hesitant to possible discharging home on insulin due to above issues and think pt would best benefit from a CGM and insulin pump- noted DM is managed by PCP.   Labs reviewed: CBGS: [id number] (inpatient orders for glycemic control are 0-5 units insulin aspart daily at bedtime, 0-9 units insulin aspart TID with meals, and 12 units insulin detemir daily).   NUTRITION - FOCUSED PHYSICAL EXAM:  Flowsheet Row Most Recent Value  Orbital Region No depletion  Upper Arm Region Mild depletion  Thoracic and Lumbar Region No depletion  Buccal Region No depletion  Temple Region No depletion  Clavicle Bone Region No depletion  Clavicle and Acromion Bone Region No depletion  Scapular Bone Region No depletion  Dorsal Hand Mild depletion  Patellar Region Mild depletion  Anterior Thigh Region Mild depletion  Posterior Calf Region Mild depletion  Edema (RD Assessment) None  Hair Reviewed  Eyes Reviewed  Mouth Reviewed  Skin Reviewed  Nails Reviewed       Diet Order:   Diet Order            Diet Carb Modified Fluid consistency: Thin; Room service appropriate? Yes with Assist  Diet effective now                 EDUCATION NEEDS:   Education needs have been addressed  Skin:  Skin  Assessment: Reviewed RN Assessment  Last BM:  11/24/20  Height:   Ht Readings from Last 1 Encounters:  11/23/20 5\' 2"  (1.575 m)    Weight:   Wt Readings from Last 1 Encounters:  11/23/20 73 kg    Ideal Body Weight:  50 kg  BMI:  There is no height or weight on file to calculate BMI.  Estimated Nutritional Needs:   Kcal:  1600-1800  Protein:  85-100 grams  Fluid:  >1.6 L    Loistine Chance, RD, LDN, [place] Registered Dietitian II Certified Diabetes Care and Education  Specialist Please refer to [facility] for RD and/or RD on-call/weekend/after hours pager

## 2020-11-25 NOTE — Evaluation (Signed)
Physical Therapy Evaluation Patient Details Name: [name] MRN: [id number] DOB: November 09, 1928 Today's Date: 11/25/2020   History of Present Illness  87 y.o. female who is admitted to [facility] on 11/24/2020 with hyperglycemia after presenting from home to [facility] ED complaining of elevated blood sugar. PMH:  type 2 diabetes mellitus, hypertension, stage IIIb chronic kidney disease with baseline creatinine around 1.6,  Clinical Impression  PTA pt living with daughter in multistory home with 7 steps to enter and bed and bath on main level. Per daughter 3 weeks ago pt was able to go to church in 2 inch heels without AD, and she is independent in [place]. Daughter provides for cooking, medication management and transportation. Pt is currently limited in safe mobility by decreased cognition especially knowledge of deficits and safety, and increased O2 demand in presence of decreased strength and balance in standing. Pt is currently min guard for bed mobility, and transfers. Pt is min guard for ambulation however refuses RW use however requires use of furniture for improved balance with mobility. PTPT recommending HHPT at discharge to improve strength, and balance to safely mobilize in her home environment. PT will continue to follow acutely for Rollator and stair training.      Follow Up Recommendations Home health PT;Supervision/Assistance - 24 hour    Equipment Recommendations  Other (comment) (Rollator)    Recommendations for Other Services       Precautions / Restrictions Precautions Precautions: Fall Precaution Comments: unsteady on her feet and decreased awareness of deficits Restrictions Weight Bearing Restrictions: No      Mobility  Bed Mobility Overal bed mobility: Needs Assistance Bed Mobility: Supine to Sit     Supine to sit: HOB elevated;Min guard     General bed mobility comments: min guard for safety    Transfers Overall transfer level: Needs assistance Equipment  used: None Transfers: Sit to/from Stand Sit to Stand: Min guard         General transfer comment: min guard for safety, increased effort for self steady  Ambulation/Gait Ambulation/Gait assistance: Min guard Gait Distance (Feet): 20 Feet Assistive device: None Gait Pattern/deviations: Step-through pattern;Decreased step length - right;Decreased step length - left;Trunk flexed;Wide base of support;Shuffle Gait velocity: slowed Gait velocity interpretation: <1.31 ft/sec, indicative of household ambulator General Gait Details: min guard for safety, refuses RW usage reaching out for furniture to steady with slowed shuffling gait      Balance Overall balance assessment: Needs assistance Sitting-balance support: Feet supported;No upper extremity supported Sitting balance-Leahy Scale: Good     Standing balance support: No upper extremity supported Standing balance-Leahy Scale: Fair                               Pertinent Vitals/Pain Pain Assessment: No/denies pain    Home Living Family/patient expects to be discharged to:: Private residence Living Arrangements: Children [name]) Available Help at Discharge: Family;Personal care attendant;Available 24 hours/day Type of Home: House Home Access: Stairs to enter Entrance Stairs-Rails: Left Entrance Stairs-Number of Steps: 7 Home Layout: Two level;Able to live on main level with bedroom/bathroom Home Equipment: Wheelchair - manual;Cane - single point;Grab bars - tub/shower      Prior Function Level of Independence: Independent;Needs assistance   Gait / Transfers Assistance Needed: 3 weeks ago was able to go to church wearing heels without AD  ADL's / Homemaking Assistance Needed: independent with bADL daughter provides for iADLs  Comments: was  Hand Dominance   Dominant Hand: Right    Extremity/Trunk Assessment   Upper Extremity Assessment Upper Extremity Assessment: Defer to OT evaluation    Lower  Extremity Assessment Lower Extremity Assessment: Generalized weakness       Communication   Communication: HOH  Cognition Arousal/Alertness: Awake/alert Behavior During Therapy: WFL for tasks assessed/performed Overall Cognitive Status: Impaired/Different from baseline Area of Impairment: Safety/judgement;Problem solving;Following commands;Awareness                       Following Commands: Follows multi-step commands with increased time Safety/Judgement: Decreased awareness of safety;Decreased awareness of deficits Awareness: Emergent Problem Solving: Requires verbal cues;Requires tactile cues        General Comments General comments (skin integrity, edema, etc.): Pt on 2L O2 via [place] with SaO2 in 90s during session, Daughter [name] present and filling in gaps in home set up and PLOF        Assessment/Plan    PT Assessment Patient needs continued PT services  PT Problem List Decreased strength;Decreased activity tolerance;Decreased balance;Decreased mobility;Decreased cognition;Decreased safety awareness       PT Treatment Interventions DME instruction;Gait training;Stair training;Functional mobility training;Therapeutic activities;Therapeutic exercise;Balance training;Cognitive remediation;Patient/family education    PT Goals (Current goals can be found in the Care Plan section)  Acute Rehab PT Goals Patient Stated Goal: go home and get around better PT Goal Formulation: With patient/family Time For Goal Achievement: 12/09/20 Potential to Achieve Goals: Good    Frequency Min 3X/week    AM-PAC PT "6 Clicks" Mobility  Outcome Measure Help needed turning from your back to your side while in a flat bed without using bedrails?: None Help needed moving from lying on your back to sitting on the side of a flat bed without using bedrails?: None Help needed moving to and from a bed to a chair (including a wheelchair)?: None Help needed standing up from a chair using  your arms (e.g., wheelchair or bedside chair)?: None Help needed to walk in hospital room?: A Little Help needed climbing 3-5 steps with a railing? : A Lot 6 Click Score: 21    End of Session Equipment Utilized During Treatment: Oxygen Activity Tolerance: Patient tolerated treatment well Patient left: in chair;with call bell/phone within reach;with family/visitor present;with chair alarm set Nurse Communication: Mobility status PT Visit Diagnosis: Unsteadiness on feet (R26.81);Muscle weakness (generalized) (M62.81);Difficulty in walking, not elsewhere classified (R26.2)    Time: 5456-2563 PT Time Calculation (min) (ACUTE ONLY): 44 min   Charges:   PT Evaluation $PT Eval Moderate Complexity: 1 Mod PT Treatments $Therapeutic Activity: 23-37 mins        Alexei Doswell B. Migdalia Dk PT, DPT Acute Rehabilitation Services Pager [phone] Office [phone]   [place] 11/25/2020, 2:14 PM

## 2020-11-25 NOTE — TOC Progression Note (Addendum)
Transition of Care [facility]) - Progression Note    Patient Details  Name: [name] MRN: [id number] Date of Birth: 03/26/1929  Transition of Care [facility]) CM/SW [place], RN Phone Number: 11/25/2020, 2:26 PM  Clinical Narrative:    Damaris Schooner to daughter [name] in patients room. She understands that [facility] services would be out of pocket. She requests rollator and 3:1 spoke about PACE program and choice of Home Health providers. She prefers Manufacturing [facility]) patient currently on oxygen, will ask RN to do walking saturations to see if qualifies for home oxygen. . 0923 contacted RN about walking saturations, (patient does not want [employer]). Kandice Robinsons [facility] about Home Health and MD to place orders  Ordered walker and 3:1 1448 eligibilty sent for levimir versus lantis pen as well as dextron glucose monitoring system  1535 [facility]) declined [facility] due to staffing 1540 accepted by [facility] for [facility]  Expected Discharge Plan: Home/Self Care Barriers to Discharge: Continued Medical Work up  Expected Discharge Plan and Services Expected Discharge Plan: Home/Self Care       Living arrangements for the past 2 months: Single Family Home                                       Social Determinants of Health (SDOH) Interventions    Readmission Risk Interventions No flowsheet data found.

## 2020-11-26 LAB — CALCIUM, IONIZED: Calcium, Ionized, Serum: 4.9 mg/dL (ref 4.5–5.6)

## 2020-11-26 LAB — CBC WITH DIFFERENTIAL/PLATELET
Abs Immature Granulocytes: 0.02 10*3/uL (ref 0.00–0.07)
Basophils Absolute: 0 10*3/uL (ref 0.0–0.1)
Basophils Relative: 1 %
Eosinophils Absolute: 0.5 10*3/uL (ref 0.0–0.5)
Eosinophils Relative: 11 %
HCT: 35.7 % — ABNORMAL LOW (ref 36.0–46.0)
Hemoglobin: 11.7 g/dL — ABNORMAL LOW (ref 12.0–15.0)
Immature Granulocytes: 1 %
Lymphocytes Relative: 18 %
Lymphs Abs: 0.8 10*3/uL (ref 0.7–4.0)
MCH: 29.3 pg (ref 26.0–34.0)
MCHC: 32.8 g/dL (ref 30.0–36.0)
MCV: 89.3 fL (ref 80.0–100.0)
Monocytes Absolute: 0.6 10*3/uL (ref 0.1–1.0)
Monocytes Relative: 14 %
Neutro Abs: 2.3 10*3/uL (ref 1.7–7.7)
Neutrophils Relative %: 55 %
Platelets: 205 10*3/uL (ref 150–400)
RBC: 4 MIL/uL (ref 3.87–5.11)
RDW: 13.4 % (ref 11.5–15.5)
WBC: 4.2 10*3/uL (ref 4.0–10.5)
nRBC: 0 % (ref 0.0–0.2)

## 2020-11-26 LAB — GLUCOSE, CAPILLARY
Glucose-Capillary: 245 mg/dL — ABNORMAL HIGH (ref 70–99)
Glucose-Capillary: 324 mg/dL — ABNORMAL HIGH (ref 70–99)
Glucose-Capillary: 200 mg/dL — ABNORMAL HIGH (ref 70–99)
Glucose-Capillary: 233 mg/dL — ABNORMAL HIGH (ref 70–99)

## 2020-11-26 LAB — COMPREHENSIVE METABOLIC PANEL
ALT: 13 U/L (ref 0–44)
AST: 19 U/L (ref 15–41)
Albumin: 2.6 g/dL — ABNORMAL LOW (ref 3.5–5.0)
Alkaline Phosphatase: 205 U/L — ABNORMAL HIGH (ref 38–126)
Anion gap: 9 (ref 5–15)
BUN: 23 mg/dL (ref 8–23)
CO2: 27 mmol/L (ref 22–32)
Calcium: 9.2 mg/dL (ref 8.9–10.3)
Chloride: 96 mmol/L — ABNORMAL LOW (ref 98–111)
Creatinine, Ser: 1.56 mg/dL — ABNORMAL HIGH (ref 0.44–1.00)
GFR, Estimated: 32 mL/min — ABNORMAL LOW (ref >60)
Glucose, Bld: 237 mg/dL — ABNORMAL HIGH (ref 70–99)
Potassium: 3.8 mmol/L (ref 3.5–5.1)
Sodium: 132 mmol/L — ABNORMAL LOW (ref 135–145)
Total Bilirubin: 0.3 mg/dL (ref 0.3–1.2)
Total Protein: 6.5 g/dL (ref 6.5–8.1)

## 2020-11-26 LAB — MAGNESIUM: Magnesium: 2 mg/dL (ref 1.7–2.4)

## 2020-11-26 LAB — BRAIN NATRIURETIC PEPTIDE: B Natriuretic Peptide: 42.7 pg/mL (ref 0.0–100.0)

## 2020-11-26 MED ORDER — SODIUM CHLORIDE 0.9 % IV SOLN
1.0000 g | INTRAVENOUS | Status: AC
  Administered 2020-11-27: 1 g via INTRAVENOUS
  Filled 2020-11-26: qty 10

## 2020-11-26 MED ORDER — INSULIN DETEMIR 100 UNIT/ML ~~LOC~~ SOLN
30.0000 [IU] | Freq: Every day | SUBCUTANEOUS | Status: DC
  Filled 2020-11-26: qty 0.3

## 2020-11-26 MED ORDER — INSULIN DETEMIR 100 UNIT/ML ~~LOC~~ SOLN
25.0000 [IU] | Freq: Every day | SUBCUTANEOUS | Status: DC
  Administered 2020-11-26: 25 [IU] via SUBCUTANEOUS
  Filled 2020-11-26: qty 0.25

## 2020-11-26 MED ORDER — LACTATED RINGERS IV SOLN: INTRAVENOUS | Status: AC

## 2020-11-26 NOTE — Progress Notes (Signed)
PROGRESS NOTE                                                                                                                                                                                                             Patient Demographics:    [name], is a 87 y.o. female, DOB - 08/15/1928, EWY:[id number]  Outpatient Primary MD for the patient is Seward Carol, MD    LOS - 1  Admit date - 11/24/2020    No chief complaint on file.      Brief Narrative (HPI from H&P) - [name] is a 87 y.o. female with medical history significant for type 2 diabetes mellitus, hypertension, stage IIIb chronic kidney disease with baseline creatinine around 1.6,  who is admitted to [facility] on 11/24/2020 with hyperglycemia after presenting from home to [facility] ED complaining of elevated blood sugar.    Subjective:   Patient in bed, appears comfortable, denies any headache, no fever, no chest pain or pressure, no shortness of breath , no abdominal pain. No new focal weakness.    Assessment  & Plan :      1. DM2 with hyperglycemia  - started on Lantus + ISS, DM and Insulin education provided, dose adjusted 11/26/20.  Lab Results  Component Value Date   HGBA1C 12.5 (H) 11/24/2020   . CBG (last 3)  Recent Labs    11/25/20 1636 11/25/20 2201 11/26/20 0824  GLUCAP 246* 235* 245*    2. Acute Cystitis - Rocephin, follow cultures.  3.  Hypokalemia and hypomagnesemia.  Replaced.  4.  AKI on CKD stage IIIb.  Baseline creatinine around 1.6. improved with IVF.  5. HTN - meds adjusted, monitor.  6. GERD - PPI.  7. Dementia with nighttime delirium at home.  Expect worsening, family educated, daughter wants to be involved if patient gets excessively confused does not follow         Condition - Fair  Family Communication  : Daughter [name] [phone]  Bedside on 11/25/20, 11/26/20  Code Status :  Full  Consults  :  None  PUD  Prophylaxis : PPI   Procedures  :     CT head - non acute      Disposition Plan  :    Status is: Observation  Dispo: The patient is from: Home  Anticipated d/c is to: Home              Patient currently is not medically stable to d/c.   Difficult to place patient No  DVT Prophylaxis  :    heparin injection 5,000 Units Start: 11/25/20 1400 SCDs Start: 11/24/20 1427  Heparin   Lab Results  Component Value Date   PLT 205 11/26/2020    Diet :  Diet Order            Diet Carb Modified Fluid consistency: Thin; Room service appropriate? Yes with Assist  Diet effective now                  Inpatient Medications  Scheduled Meds: . allopurinol  100 mg Oral BID  . amLODipine  10 mg Oral Daily  . aspirin EC  81 mg Oral Daily  . feeding supplement (GLUCERNA SHAKE)  237 mL Oral TID BM  . heparin injection (subcutaneous)  5,000 Units Subcutaneous Q8H  . insulin aspart  0-5 Units Subcutaneous QHS  . insulin aspart  0-9 Units Subcutaneous TID WC  . insulin detemir  25 Units Subcutaneous Daily  . insulin starter kit- pen needles  1 kit Other Once  . levETIRAcetam  500 mg Oral Daily  . metoprolol tartrate  100 mg Oral BID  . multivitamin with minerals  1 tablet Oral Daily  . pantoprazole  40 mg Oral Daily   Continuous Infusions: . cefTRIAXone (ROCEPHIN)  IV 2 g (11/26/20 0904)   PRN Meds:.acetaminophen **OR** [DISCONTINUED] acetaminophen, dextrose  Antibiotics  :    Anti-infectives (From admission, onward)   Start     Dose/Rate Route Frequency Ordered Stop   11/25/20 0800  cefTRIAXone (ROCEPHIN) 2 g in sodium chloride 0.9 % 100 mL IVPB        2 g 200 mL/hr over 30 Minutes Intravenous Every 24 hours 11/24/20 1453     11/24/20 1000  cefTRIAXone (ROCEPHIN) 1 g in sodium chloride 0.9 % 100 mL IVPB        1 g 200 mL/hr over 30 Minutes Intravenous  Once 11/24/20 1007 11/24/20 1103       Time Spent in minutes  30   Lala Lund M.D on 11/26/2020 at  10:16 AM  To page go to [email or web address]   [facility] -  Office  [phone]    See all Orders from today for further details    Objective:   Vitals:   11/25/20 2200 11/26/20 0017 11/26/20 0405 11/26/20 0821  BP: (!) 148/53 (!) 138/58 (!) 126/43 (!) 132/59  Pulse: 74 72 65 69  Resp: _0 Temp: 98.1 F (36.7 C) 98.4 F (36.9 C) 97.9 F (36.6 C) 98.6 F (37 C)  TempSrc: Oral Axillary Axillary Oral  SpO2: 100% 92% 97% 98%  Weight:   73.2 kg     Wt Readings from Last 3 Encounters:  11/26/20 73.2 kg  11/23/20 73 kg  08/22/14 81.2 kg     Intake/Output Summary (Last 24 hours) at 11/26/2020 1016 Last data filed at 11/26/2020 0500 Gross per 24 hour  Intake 1680 ml  Output --  Net 1680 ml     Physical Exam  Awake mildly confused, No new F.N deficits, Normal affect  [place].AT,PERRAL Supple Neck,No JVD, No cervical lymphadenopathy appriciated.  Symmetrical Chest wall movement, Good air movement bilaterally, CTAB RRR,No Gallops, Rubs or new Murmurs, No Parasternal Heave +ve B.Sounds, Abd Soft, No tenderness, No organomegaly appriciated, No rebound -  guarding or rigidity. No Cyanosis, Clubbing or edema, No new Rash or bruise     Data Review:    CBC Recent Labs  Lab 11/23/20 2025 11/24/20 1001 11/24/20 1303 11/25/20 0208 11/26/20 0056  WBC 5.9  --  5.2 6.3 4.2  HGB 12.7 14.6 13.2 12.0 11.7*  HCT 38.9 43.0 40.0 35.8* 35.7*  PLT 258  --  232 226 205  MCV 90.7  --  88.7 88.2 89.3  MCH 29.6  --  29.3 29.6 29.3  MCHC 32.6  --  33.0 33.5 32.8  RDW 13.5  --  13.3 13.4 13.4  LYMPHSABS 0.8  --  0.8  --  0.8  MONOABS 0.5  --  0.4  --  0.6  EOSABS 0.3  --  0.2  --  0.5  BASOSABS 0.0  --  0.0  --  0.0    Recent Labs  Lab 11/23/20 2025 11/24/20 1001 11/24/20 1303 11/24/20 1634 11/24/20 2029 11/25/20 0208 11/25/20 0601 11/25/20 0754 11/26/20 0056  NA 122*   < > 131* 129* 129* 129* 130*  --  132*  K 3.8   < > 3.2* 3.6 2.7* 3.7 3.7  --  3.8  CL  82*  --  88* 87* 91* 90* 91*  --  96*  CO2 28  --  _0 --  27  GLUCOSE 814*  --  278* 366* 228* 239* 262*  --  237*  BUN 28*  --  29* 26* 27* 26* 25*  --  23  CREATININE 2.33*  --  2.24* 2.04* 2.03* 1.81* 1.90*  --  1.56*  CALCIUM 9.0  --  9.4 9.0 8.7* 8.8* 8.8*  --  9.2  AST 17  --  21  --   --  19  --   --  19  ALT 15  --  16  --   --  13  --   --  13  ALKPHOS 265*  --  254*  --   --  221*  --   --  205*  BILITOT 0.4  --  0.3  --   --  0.5  --   --  0.3  ALBUMIN 2.9*  --  3.0*  --   --  2.7*  --   --  2.6*  MG 1.7  --   --  1.7  --  1.6* 1.5*  --  2.0  HGBA1C  --   --   --  12.5*  --   --   --   --   --   BNP  --   --   --   --   --   --   --  35.8 42.7   < > = values in this interval not displayed.    ------------------------------------------------------------------------------------------------------------------ No results for input(s): CHOL, HDL, LDLCALC, TRIG, CHOLHDL, LDLDIRECT in the last 72 hours.  Lab Results  Component Value Date   HGBA1C 12.5 (H) 11/24/2020   CBG (last 3)  Recent Labs    11/25/20 1636 11/25/20 2201 11/26/20 0824  GLUCAP 246* 235* 245*   ------------------------------------------------------------------------------------------------------------------ No results for input(s): TSH, T4TOTAL, T3FREE, THYROIDAB in the last 72 hours.  Invalid input(s): FREET3  Cardiac Enzymes No results for input(s): CKMB, TROPONINI, MYOGLOBIN in the last 168 hours.  Invalid input(s): CK ------------------------------------------------------------------------------------------------------------------    Component Value Date/Time   BNP 42.7 11/26/2020 0056    Micro Results Recent Results (from the past  240 hour(s))  Culture, blood (routine x 2)     Status: None (Preliminary result)   Collection Time: 11/24/20 10:02 AM   Specimen: BLOOD LEFT FOREARM  Result Value Ref Range Status   Specimen Description BLOOD LEFT FOREARM  Final   Special Requests    Final    BOTTLES DRAWN AEROBIC AND ANAEROBIC Blood Culture adequate volume   Culture   Final    NO GROWTH 2 DAYS Performed at [facility], [address], [place], [place] [address]    Report Status PENDING  Incomplete  Culture, blood (routine x 2)     Status: None (Preliminary result)   Collection Time: 11/24/20 10:02 AM   Specimen: BLOOD  Result Value Ref Range Status   Specimen Description BLOOD RIGHT ANTECUBITAL  Final   Special Requests   Final    BOTTLES DRAWN AEROBIC AND ANAEROBIC Blood Culture adequate volume   Culture   Final    NO GROWTH 2 DAYS Performed at [facility], [place] [address], [place], [place] [address]    Report Status PENDING  Incomplete  Urine culture     Status: Abnormal   Collection Time: 11/24/20  1:37 PM   Specimen: Urine, Random  Result Value Ref Range Status   Specimen Description URINE, RANDOM  Final   Special Requests   Final    NONE Performed at [facility], [place] [address] [place], [place] [address]    Culture MULTIPLE SPECIES PRESENT, SUGGEST RECOLLECTION (A)  Final   Report Status 11/25/2020 FINAL  Final  MRSA PCR Screening     Status: None   Collection Time: 11/25/20  7:58 AM   Specimen: Nasal Mucosa; Nasopharyngeal  Result Value Ref Range Status   MRSA by PCR NEGATIVE NEGATIVE Final    Comment:        The GeneXpert MRSA Assay (FDA approved for NASAL specimens only), is one component of a comprehensive MRSA colonization surveillance program. It is not intended to diagnose MRSA infection nor to guide or monitor treatment for MRSA infections. Performed at [facility], [place] [address] [place], [place] [address]     Radiology Reports  CT Head Wo Contrast  Result Date: 11/23/2020 CLINICAL DATA:  Mental status changes without known cause. EXAM: CT HEAD WITHOUT CONTRAST TECHNIQUE: Contiguous axial images were obtained from the base of the skull through the vertex without intravenous contrast. COMPARISON:   July 29, 2017 FINDINGS: Brain: No evidence of acute infarction, hemorrhage, hydrocephalus, extra-axial collection or mass lesion/mass effect. Signs of atrophy and chronic microvascular ischemic changes. Vascular: No hyperdense vessel or unexpected calcification. Skull: Normal. Negative for fracture or focal lesion. Calvarial hyperostosis with similar appearance. Areas of dural calcification are unchanged. Sinuses/Orbits: Visualized paranasal sinuses and orbits are unremarkable. Other: None IMPRESSION: 1. No acute intracranial pathology. 2. Signs of atrophy and chronic microvascular ischemic changes similar to the prior study. Electronically Signed   By: Zetta Bills M.D.   On: 11/23/2020 21:00   DG CHEST PORT 1 VIEW  Result Date: 11/24/2020 CLINICAL DATA:  Hyperglycemia. EXAM: PORTABLE CHEST 1 VIEW COMPARISON:  August 31, 2014. FINDINGS: Stable cardiomegaly. No pneumothorax or pleural effusion is noted. Lungs are clear. Bony thorax is unremarkable. IMPRESSION: No active disease. Aortic Atherosclerosis (ICD10-I70.0). Electronically Signed   By: Marijo Conception M.D.   On: 11/24/2020 15:39

## 2020-11-26 NOTE — Care Management (Signed)
1. [place]- $ 45.00  [place]    2. LANTUS SOLOSTAR  COVER- YES  CO-PAY- $ 45.00  TIER- 3 DRUG  PRIOR APPROVAL- NO    3. DEXTRON GLUCOSE MONITORING SYSTEM  COVER-YES  PREFERRED BRAND:  FREE STYL  ONE-TOUCH    NO DEDUCTIBLE WITH PLAN   PREFERRED PHARMACY : YES  [facility]

## 2020-11-26 NOTE — TOC Transition Note (Signed)
Transition of Care [facility]) - CM/SW Discharge Note   Patient Details  Name: [name] MRN: [id number] Date of Birth: [birth year]  Transition of Care [facility]) CM/SW Contact:  Verdell Carmine, RN Phone Number: 11/26/2020, 8:35 AM   Clinical Narrative:    Oxygen qualifications noted oxygen ordered from [facility]   Final next level of care: [place] Barriers to Discharge: No Barriers Identified   Patient Goals and CMS Choice        Discharge Placement                       Discharge Plan and Services                DME Arranged: Oxygen DME Agency: [facility] Date DME Agency Contacted: 11/26/20 Time DME Agency Contacted: [phone] Representative spoke with at DME Agency: [place]: PT,OT,Nurse's [place] Work [facility] Agency: [facility] Date [place]: 11/25/20 Time [place]: [place] Representative spoke with at [place]: [place] ([place]) Interventions     Readmission Risk Interventions No flowsheet data found.

## 2020-11-27 LAB — CBC WITH DIFFERENTIAL/PLATELET
Abs Immature Granulocytes: 0.01 10*3/uL (ref 0.00–0.07)
Basophils Absolute: 0 10*3/uL (ref 0.0–0.1)
Basophils Relative: 0 %
Eosinophils Absolute: 0.5 10*3/uL (ref 0.0–0.5)
Eosinophils Relative: 12 %
HCT: 34 % — ABNORMAL LOW (ref 36.0–46.0)
Hemoglobin: 11 g/dL — ABNORMAL LOW (ref 12.0–15.0)
Immature Granulocytes: 0 %
Lymphocytes Relative: 23 %
Lymphs Abs: 1 10*3/uL (ref 0.7–4.0)
MCH: 29 pg (ref 26.0–34.0)
MCHC: 32.4 g/dL (ref 30.0–36.0)
MCV: 89.7 fL (ref 80.0–100.0)
Monocytes Absolute: 0.6 10*3/uL (ref 0.1–1.0)
Monocytes Relative: 13 %
Neutro Abs: 2.2 10*3/uL (ref 1.7–7.7)
Neutrophils Relative %: 52 %
Platelets: 211 10*3/uL (ref 150–400)
RBC: 3.79 MIL/uL — ABNORMAL LOW (ref 3.87–5.11)
RDW: 13.6 % (ref 11.5–15.5)
WBC: 4.2 10*3/uL (ref 4.0–10.5)
nRBC: 0 % (ref 0.0–0.2)

## 2020-11-27 LAB — COMPREHENSIVE METABOLIC PANEL
ALT: 11 U/L (ref 0–44)
AST: 19 U/L (ref 15–41)
Albumin: 2.4 g/dL — ABNORMAL LOW (ref 3.5–5.0)
Alkaline Phosphatase: 191 U/L — ABNORMAL HIGH (ref 38–126)
Anion gap: 9 (ref 5–15)
BUN: 17 mg/dL (ref 8–23)
CO2: 29 mmol/L (ref 22–32)
Calcium: 8.7 mg/dL — ABNORMAL LOW (ref 8.9–10.3)
Chloride: 96 mmol/L — ABNORMAL LOW (ref 98–111)
Creatinine, Ser: 1.39 mg/dL — ABNORMAL HIGH (ref 0.44–1.00)
GFR, Estimated: 36 mL/min — ABNORMAL LOW (ref >60)
Glucose, Bld: 284 mg/dL — ABNORMAL HIGH (ref 70–99)
Potassium: 3.9 mmol/L (ref 3.5–5.1)
Sodium: 134 mmol/L — ABNORMAL LOW (ref 135–145)
Total Bilirubin: 0.4 mg/dL (ref 0.3–1.2)
Total Protein: 6.3 g/dL — ABNORMAL LOW (ref 6.5–8.1)

## 2020-11-27 LAB — MAGNESIUM: Magnesium: 1.7 mg/dL (ref 1.7–2.4)

## 2020-11-27 LAB — BRAIN NATRIURETIC PEPTIDE: B Natriuretic Peptide: 85.9 pg/mL (ref 0.0–100.0)

## 2020-11-27 LAB — RESP PANEL BY RT-PCR (FLU A&B, COVID) ARPGX2
Influenza A by PCR: NEGATIVE
Influenza B by PCR: NEGATIVE
SARS Coronavirus 2 by RT PCR: NEGATIVE

## 2020-11-27 LAB — GLUCOSE, CAPILLARY
Glucose-Capillary: 291 mg/dL — ABNORMAL HIGH (ref 70–99)
Glucose-Capillary: 380 mg/dL — ABNORMAL HIGH (ref 70–99)

## 2020-11-27 MED ORDER — "INSULIN SYRINGE-NEEDLE U-100 25G X 1"" 1 ML MISC": 0 refills | Status: DC

## 2020-11-27 MED ORDER — INSULIN STARTER KIT- PEN NEEDLES (ENGLISH): 1.0000 | Freq: Once | 0 refills | Status: AC

## 2020-11-27 MED ORDER — LANTUS SOLOSTAR 100 UNIT/ML ~~LOC~~ SOPN: 35.0000 [IU] | PEN_INJECTOR | Freq: Every day | SUBCUTANEOUS | 0 refills | Status: DC

## 2020-11-27 MED ORDER — INSULIN ASPART 100 UNIT/ML IJ SOLN
3.0000 [IU] | Freq: Three times a day (TID) | INTRAMUSCULAR | Status: DC
  Administered 2020-11-27: 3 [IU] via SUBCUTANEOUS

## 2020-11-27 MED ORDER — CEPHALEXIN 500 MG PO CAPS: 500.0000 mg | ORAL_CAPSULE | Freq: Three times a day (TID) | ORAL | 0 refills | Status: AC

## 2020-11-27 MED ORDER — INSULIN DETEMIR 100 UNIT/ML ~~LOC~~ SOLN
35.0000 [IU] | Freq: Every day | SUBCUTANEOUS | Status: DC
  Administered 2020-11-27: 35 [IU] via SUBCUTANEOUS
  Filled 2020-11-27: qty 0.35

## 2020-11-27 MED ORDER — INSULIN ASPART 100 UNIT/ML FLEXPEN: PEN_INJECTOR | SUBCUTANEOUS | 0 refills | Status: DC

## 2020-11-27 MED ORDER — HYDRALAZINE HCL 100 MG PO TABS: 100.0000 mg | ORAL_TABLET | Freq: Three times a day (TID) | ORAL | 0 refills | Status: DC

## 2020-11-27 MED ORDER — BLOOD GLUCOSE MONITOR KIT: PACK | 1 refills | Status: AC

## 2020-11-27 MED ORDER — HYDRALAZINE HCL 50 MG PO TABS
100.0000 mg | ORAL_TABLET | Freq: Three times a day (TID) | ORAL | Status: DC
  Administered 2020-11-27: 100 mg via ORAL
  Filled 2020-11-27: qty 2

## 2020-11-27 MED ORDER — HYDRALAZINE HCL 50 MG PO TABS
50.0000 mg | ORAL_TABLET | Freq: Three times a day (TID) | ORAL | Status: DC
  Administered 2020-11-27: 50 mg via ORAL
  Filled 2020-11-27: qty 1

## 2020-11-27 NOTE — TOC Transition Note (Signed)
Transition of Care [facility]) - CM/SW Discharge Note   Patient Details  Name: [name] MRN: [id number] Date of Birth: 1928-10-13  Transition of Care [facility]) CM/SW Contact:  Carles Collet, RN Phone Number: 11/27/2020, 10:00 AM   Clinical Narrative:    Kaylyn Layer of DC. Notified nurse that patient should have oxygen rollator and 3/1 in room to take home. No other CM needs identified.     Final next level of care: [place] Barriers to Discharge: No Barriers Identified   Patient Goals and CMS Choice        Discharge Placement                       Discharge Plan and Services                DME Arranged: Oxygen DME Agency: [facility] Date DME Agency Contacted: 11/26/20 Time DME Agency Contacted: [phone] Representative spoke with at DME Agency: [place]: PT,OT,Nurse's [place] Work [employer] Agency: [place] Date [place]: 11/25/20 Time [place]: [place] Representative spoke with at [place]: [place] ([place]) Interventions     Readmission Risk Interventions No flowsheet data found.

## 2020-11-27 NOTE — Discharge Summary (Signed)
[name] IHW:[id number] DOB: [birth year] DOA: 11/24/2020  PCP: Seward Carol, MD  Admit date: 11/24/2020  Discharge date: 11/27/2020  Admitted From: Home   Disposition:  Home   Recommendations for Outpatient Follow-up:   Follow up with PCP in 1-2 weeks  PCP Please obtain BMP/CBC, 2 view CXR in 1week,  (see Discharge instructions)   PCP Please follow up on the following pending results: Monitor BP, BMP and Fluid status closely   Home Health: RN, PT   Equipment/Devices: Walker, PRN 2lit o2  Consultations: None  Discharge Condition: Stable    CODE STATUS: Full    Diet Recommendation: Heart Healthy Low Carb  Diet Order            Diet Carb Modified Fluid consistency: Thin; Room service appropriate? Yes with Assist  Diet effective now                  CC - Weakness   Brief history of present illness from the day of admission and additional interim summary    [name] a 87 y.o.femalewith medical history significant fortype 2 diabetes mellitus, hypertension, stage IIIb chronic kidney disease with baseline creatinine around 1.6,who is [facility] on [facility]/28/2022with hyperglycemiaafter presenting from home to [facility] ED complaining of elevated blood sugar.                                                                  Hospital Course     1. DM2 with hyperglycemia  - started on Lantus + ISS, DM and Insulin education provided, dose adjusted further today, stable glycemic control will be discharged home on Lantus and sliding scale with testing supplies, requested to check CBGs QA CHS and show the results to PCP for further adjustment within a week.  2. Acute Cystitis -days of empiric Rocephin followed by 2 days of oral Keflex upon discharge, cultures inconclusive with poor  sampling.  3.  Hypokalemia and hypomagnesemia.  Replaced.  4.  AKI on CKD stage IIIb.  Baseline creatinine around 1.6. improved with IVF.  Holding ACE/diuretic, PCP to monitor renal function and diuretic need next visit.  5. HTN - meds adjusted, placed on hydralazine in place of diuretic ACE inhibitor, continue home dose Norvasc and beta-blocker  6. GERD - PPI.  7. Dementia with nighttime delirium at home.    Continue supportive care good support from daughter who lives with her.     Discharge diagnosis     Principal Problem:   Hyperglycemia Active Problems:   HHNC (hyperglycemic hyperosmolar nonketotic coma) ([place])   Acute lower UTI   Acute metabolic encephalopathy   Hypokalemia   AKI (acute kidney injury) ([place])   GERD (gastroesophageal reflux disease)   Hypertension    Discharge instructions  Discharge Medications   Allergies as of 11/27/2020      Reactions   Ace Inhibitors Swelling, Rash   Dilaudid [hydromorphone Hcl] Other (See Comments)   Hallucinations (auditory and visual)   Latex Rash   Tape Rash   Prefers PAPER TAPE, PLEASE!!      Medication List    STOP taking these medications   glyBURIDE-metformin 2.5-500 MG tablet Commonly known as: GLUCOVANCE   irbesartan-hydrochlorothiazide 150-12.5 MG tablet Commonly known as: AVALIDE   meloxicam 15 MG tablet Commonly known as: MOBIC   predniSONE 20 MG tablet Commonly known as: DELTASONE     TAKE these medications   allopurinol 100 MG tablet Commonly known as: ZYLOPRIM Take 100 mg by mouth 2 (two) times daily.   amLODipine 10 MG tablet Commonly known as: NORVASC Take 10 mg by mouth daily.   aspirin 81 MG tablet Take 81 mg by mouth daily.   augmented betamethasone dipropionate 0.05 % cream Commonly known as: DIPROLENE-AF Apply 1 application topically daily as needed for dry skin.   blood glucose meter kit and supplies Kit Dispense based on patient and insurance preference. Use up to  four times daily as directed. (FOR ICD-9 250.00, 250.01). For QAC - HS accuchecks.   cephALEXin 500 MG capsule Commonly known as: KEFLEX Take 1 capsule (500 mg total) by mouth 3 (three) times daily for 3 days.   hydrALAZINE 100 MG tablet Commonly known as: APRESOLINE Take 1 tablet (100 mg total) by mouth every 8 (eight) hours.   insulin aspart 100 UNIT/ML FlexPen Commonly known as: NOVOLOG Before each meal 3 times a day, 140-199 - 2 units, 200-250 - 4 units, 251-299 - 6 units,  300-349 - 8 units,  350 or above 10 units. Insulin PEN if approved, provide syringes and needles if needed.   insulin starter kit- pen needles Misc 1 kit by Other route once for 1 dose.   Insulin Syringe-Needle U-100 25G X 1" 1 ML Misc For 4 times a day insulin SQ, 1 month supply. Diagnosis E11.65   Lantus SoloStar 100 UNIT/ML Solostar Pen Generic drug: insulin glargine Inject 35 Units into the skin daily.   levETIRAcetam 500 MG 24 hr tablet Commonly known as: KEPPRA XR TAKE TWO TABLETS BY MOUTH AT BEDTIME What changed:   how much to take  when to take this  additional instructions   loratadine 10 MG tablet Commonly known as: CLARITIN Take 10 mg by mouth daily as needed for allergies.   metoprolol tartrate 100 MG tablet Commonly known as: LOPRESSOR Take 100 mg by mouth 2 (two) times daily.   mirtazapine 15 MG tablet Commonly known as: REMERON Take 15 mg by mouth at bedtime.   omeprazole 20 MG capsule Commonly known as: PRILOSEC Take 20 mg by mouth daily.            Durable Medical Equipment  (From admission, onward)         Start     Ordered   11/26/20 0832  For home use only DME oxygen  Once       Question Answer Comment  Length of Need 12 Months   Mode or (Route) Nasal cannula   Liters per Minute 2   Frequency Continuous (stationary and portable oxygen unit needed)   Oxygen conserving device Yes   Oxygen delivery system Gas      11/26/20 0832   11/25/20 1602  For home  use only DME 4 wheeled rolling walker with seat  Once  Question:  Patient needs a walker to treat with the following condition  Answer:  Weakness   11/25/20 1602   11/25/20 1432  For home use only DME 3 n 1  Once        11/25/20 1432           Follow-up Information    Seward Carol, MD. Call.   Specialty: Internal Medicine Why: make appointment within 1 week Contact information: [address] [place] [address] [phone]        [facility]. Schedule an appointment as soon as possible for a visit in 1 week(s).   Specialty: Internal Medicine Why: referral to endocrinology Contact information: [address] [place] [address] [phone]       [facility] Follow up.   Specialty: [place] Why: for home health services Contact information: [address] [place] [address] [phone]        [facility] Follow up.   Why: for durable equipment  walker 3:1 Contact information: [address] [place] [address] [phone]               Major procedures and Radiology Reports - PLEASE review detailed and final reports thoroughly  -        CT Head Wo Contrast  Result Date: 11/23/2020 CLINICAL DATA:  Mental status changes without known cause. EXAM: CT HEAD WITHOUT CONTRAST TECHNIQUE: Contiguous axial images were obtained from the base of the skull through the vertex without intravenous contrast. COMPARISON:  July 29, 2017 FINDINGS: Brain: No evidence of acute infarction, hemorrhage, hydrocephalus, extra-axial collection or mass lesion/mass effect. Signs of atrophy and chronic microvascular ischemic changes. Vascular: No hyperdense vessel or unexpected calcification. Skull: Normal. Negative for fracture or focal lesion. Calvarial hyperostosis with similar appearance. Areas of dural calcification are unchanged.  Sinuses/Orbits: Visualized paranasal sinuses and orbits are unremarkable. Other: None IMPRESSION: 1. No acute intracranial pathology. 2. Signs of atrophy and chronic microvascular ischemic changes similar to the prior study. Electronically Signed   By: Zetta Bills M.D.   On: 11/23/2020 21:00   DG CHEST PORT 1 VIEW  Result Date: 11/24/2020 CLINICAL DATA:  Hyperglycemia. EXAM: PORTABLE CHEST 1 VIEW COMPARISON:  August 31, 2014. FINDINGS: Stable cardiomegaly. No pneumothorax or pleural effusion is noted. Lungs are clear. Bony thorax is unremarkable. IMPRESSION: No active disease. Aortic Atherosclerosis (ICD10-I70.0). Electronically Signed   By: Marijo Conception M.D.   On: 11/24/2020 15:39    Micro Results    Recent Results (from the past 240 hour(s))  Culture, blood (routine x 2)     Status: None (Preliminary result)   Collection Time: 11/24/20 10:02 AM   Specimen: BLOOD LEFT FOREARM  Result Value Ref Range Status   Specimen Description BLOOD LEFT FOREARM  Final   Special Requests   Final    BOTTLES DRAWN AEROBIC AND ANAEROBIC Blood Culture adequate volume   Culture   Final    NO GROWTH 2 DAYS Performed at [facility], [address], [place], [place] [address]    Report Status PENDING  Incomplete  Culture, blood (routine x 2)     Status: None (Preliminary result)   Collection Time: 11/24/20 10:02 AM   Specimen: BLOOD  Result Value Ref Range Status   Specimen Description BLOOD RIGHT ANTECUBITAL  Final   Special Requests   Final    BOTTLES DRAWN AEROBIC AND  ANAEROBIC Blood Culture adequate volume   Culture   Final    NO GROWTH 2 DAYS Performed at [facility], [place] [address] [place], [place] [address]    Report Status PENDING  Incomplete  Urine culture     Status: Abnormal   Collection Time: 11/24/20  1:37 PM   Specimen: Urine, Random  Result Value Ref Range Status   Specimen Description URINE, RANDOM  Final   Special Requests   Final    NONE Performed at [facility], [place] [address], [place], [place] [address]    Culture MULTIPLE SPECIES PRESENT, SUGGEST RECOLLECTION (A)  Final   Report Status 11/25/2020 FINAL  Final  MRSA PCR Screening     Status: None   Collection Time: 11/25/20  7:58 AM   Specimen: Nasal Mucosa; Nasopharyngeal  Result Value Ref Range Status   MRSA by PCR NEGATIVE NEGATIVE Final    Comment:        The GeneXpert MRSA Assay (FDA approved for NASAL specimens only), is one component of a comprehensive MRSA colonization surveillance program. It is not intended to diagnose MRSA infection nor to guide or monitor treatment for MRSA infections. Performed at [facility], [place] [address] [place], [place] [address]   Resp Panel by RT-PCR (Flu A&B, Covid) Nasopharyngeal Swab     Status: None   Collection Time: 11/26/20 11:30 PM   Specimen: Nasopharyngeal Swab; Nasopharyngeal(NP) swabs in vial transport medium  Result Value Ref Range Status   SARS Coronavirus 2 by RT PCR NEGATIVE NEGATIVE Final    Comment: (NOTE) SARS-CoV-2 target nucleic acids are NOT DETECTED.  The SARS-CoV-2 RNA is generally detectable in upper respiratory specimens during the acute phase of infection. The lowest concentration of SARS-CoV-2 viral copies this assay can detect is 138 copies/mL. A negative result does not preclude SARS-Cov-2 infection and should not be used as the sole basis for treatment or other patient management decisions. A negative result may occur with  improper specimen collection/handling, submission of specimen other than nasopharyngeal swab, presence of viral mutation(s) within the areas targeted by this assay, and inadequate number of viral copies(<138 copies/mL). A negative result must be combined with clinical observations, patient history, and epidemiological information. The expected result is Negative.  Fact Sheet for Patients:  EntrepreneurPulse.com.au  Fact Sheet for Healthcare Providers:   IncredibleEmployment.be  This test is no t yet approved or cleared by the [place] FDA and  has been authorized for detection and/or diagnosis of SARS-CoV-2 by FDA under an Emergency Use Authorization (EUA). This EUA will remain  in effect (meaning this test can be used) for the duration of the COVID-19 declaration under Section 564(b)(1) of the Act, 21 U.S.C.section 360bbb-3(b)(1), unless the authorization is terminated  or revoked sooner.       Influenza A by PCR NEGATIVE NEGATIVE Final   Influenza B by PCR NEGATIVE NEGATIVE Final    Comment: (NOTE) The Xpert Xpress SARS-CoV-2/FLU/RSV plus assay is intended as an aid in the diagnosis of influenza from Nasopharyngeal swab specimens and should not be used as a sole basis for treatment. Nasal washings and aspirates are unacceptable for Xpert Xpress SARS-CoV-2/FLU/RSV testing.  Fact Sheet for Patients: EntrepreneurPulse.com.au  Fact Sheet for Healthcare Providers: IncredibleEmployment.be  This test is not yet approved or cleared by the [place] FDA and has been authorized for detection and/or diagnosis of SARS-CoV-2 by FDA under an Emergency Use Authorization (EUA). This EUA will remain in effect (  meaning this test can be used) for the duration of the COVID-19 declaration under Section 564(b)(1) of the Act, 21 U.S.C. section 360bbb-3(b)(1), unless the authorization is terminated or revoked.  Performed at [facility], [place] [address] [place], [place] [address]     Today   Subjective    [name] today has no headache,no chest abdominal pain,no new weakness tingling or numbness, feels much better wants to go home today.     Objective   Blood pressure (!) 163/69, pulse 70, temperature 98.1 F (36.7 C), temperature source Oral, resp. rate 18, weight 73.7 kg, SpO2 93 %.   Intake/Output Summary (Last 24 hours) at 11/27/2020 1106 Last data filed at  11/26/2020 2159 Gross per 24 hour  Intake 240 ml  Output --  Net 240 ml    Exam  Awake , mildly confused, No new F.N deficits, Normal affect [place].AT,PERRAL Supple Neck,No JVD, No cervical lymphadenopathy appriciated.  Symmetrical Chest wall movement, Good air movement bilaterally, CTAB RRR,No Gallops,Rubs or new Murmurs, No Parasternal Heave +ve B.Sounds, Abd Soft, Non tender, No organomegaly appriciated, No rebound -guarding or rigidity. No Cyanosis, Clubbing or edema, No new Rash or bruise   Data Review   CBC w Diff:  Lab Results  Component Value Date   WBC 4.2 11/27/2020   HGB 11.0 (L) 11/27/2020   HCT 34.0 (L) 11/27/2020   PLT 211 11/27/2020   LYMPHOPCT 23 11/27/2020   MONOPCT 13 11/27/2020   EOSPCT 12 11/27/2020   BASOPCT 0 11/27/2020    CMP:  Lab Results  Component Value Date   NA 134 (L) 11/27/2020   K 3.9 11/27/2020   CL 96 (L) 11/27/2020   CO2 29 11/27/2020   BUN 17 11/27/2020   CREATININE 1.39 (H) 11/27/2020   PROT 6.3 (L) 11/27/2020   ALBUMIN 2.4 (L) 11/27/2020   BILITOT 0.4 11/27/2020   ALKPHOS 191 (H) 11/27/2020   AST 19 11/27/2020   ALT 11 11/27/2020  . Lab Results  Component Value Date   HGBA1C 12.5 (H) 11/24/2020     Total Time in preparing paper work, data evaluation and todays exam - 34 minutes  Lala Lund M.D on 11/27/2020 at 11:06 AM  [facility]

## 2020-11-27 NOTE — Discharge Instructions (Signed)
Follow with Primary MD Seward Carol, MD in 7 days   Get CBC, CMP, 2 view Chest X ray -  checked next visit within 1 week by Primary MD    Activity: As tolerated with Full fall precautions use walker/cane & assistance as needed  Disposition Home    Diet: Heart Healthy Low Carb  Accuchecks 4 times/day, Once in AM empty stomach and then before each meal. Log in all results and show them to your Prim.MD in 3 days. If any glucose reading is under 80 or above 300 call your Prim MD immidiately. Follow Low glucose instructions for glucose under 80 as instructed.   Special Instructions: If you have smoked or chewed Tobacco  in the last 2 yrs please stop smoking, stop any regular Alcohol  and or any Recreational drug use.  On your next visit with your primary care physician please Get Medicines reviewed and adjusted.  Please request your Prim.MD to go over all Hospital Tests and Procedure/Radiological results at the follow up, please get all Hospital records sent to your Prim MD by signing hospital release before you go home.  If you experience worsening of your admission symptoms, develop shortness of breath, life threatening emergency, suicidal or homicidal thoughts you must seek medical attention immediately by calling 911 or calling your MD immediately  if symptoms less severe.  You Must read complete instructions/literature along with all the possible adverse reactions/side effects for all the Medicines you take and that have been prescribed to you. Take any new Medicines after you have completely understood and accpet all the possible adverse reactions/side effects.

## 2020-11-27 NOTE — Progress Notes (Signed)
AVS reviewed with daughter, all questions asked, answered, and clarified. Scripts, oxygen, and walker sent home with patient. PIV removed. Patient discharged via personal transportation with daughter.

## 2020-11-29 DIAGNOSIS — Z87891 Personal history of nicotine dependence: Secondary | ICD-10-CM | POA: Diagnosis not present

## 2020-11-29 DIAGNOSIS — E1122 Type 2 diabetes mellitus with diabetic chronic kidney disease: Secondary | ICD-10-CM | POA: Diagnosis not present

## 2020-11-29 DIAGNOSIS — M1030 Gout due to renal impairment, unspecified site: Secondary | ICD-10-CM | POA: Diagnosis not present

## 2020-11-29 DIAGNOSIS — N1832 Chronic kidney disease, stage 3b: Secondary | ICD-10-CM | POA: Diagnosis not present

## 2020-11-29 DIAGNOSIS — F0391 Unspecified dementia with behavioral disturbance: Secondary | ICD-10-CM | POA: Diagnosis not present

## 2020-11-29 DIAGNOSIS — I1310 Hypertensive heart and chronic kidney disease without heart failure, with stage 1 through stage 4 chronic kidney disease, or unspecified chronic kidney disease: Secondary | ICD-10-CM | POA: Diagnosis not present

## 2020-11-29 DIAGNOSIS — I700 Atherosclerosis of aorta: Secondary | ICD-10-CM | POA: Diagnosis not present

## 2020-11-29 DIAGNOSIS — K219 Gastro-esophageal reflux disease without esophagitis: Secondary | ICD-10-CM | POA: Diagnosis not present

## 2020-11-29 DIAGNOSIS — F05 Delirium due to known physiological condition: Secondary | ICD-10-CM | POA: Diagnosis not present

## 2020-11-29 DIAGNOSIS — H409 Unspecified glaucoma: Secondary | ICD-10-CM | POA: Diagnosis not present

## 2020-11-29 DIAGNOSIS — Z9981 Dependence on supplemental oxygen: Secondary | ICD-10-CM | POA: Diagnosis not present

## 2020-11-29 DIAGNOSIS — Z853 Personal history of malignant neoplasm of breast: Secondary | ICD-10-CM | POA: Diagnosis not present

## 2020-11-29 DIAGNOSIS — Z9181 History of falling: Secondary | ICD-10-CM | POA: Diagnosis not present

## 2020-11-29 DIAGNOSIS — G9341 Metabolic encephalopathy: Secondary | ICD-10-CM | POA: Diagnosis not present

## 2020-11-29 DIAGNOSIS — E1165 Type 2 diabetes mellitus with hyperglycemia: Secondary | ICD-10-CM | POA: Diagnosis not present

## 2020-11-29 DIAGNOSIS — N179 Acute kidney failure, unspecified: Secondary | ICD-10-CM | POA: Diagnosis not present

## 2020-11-29 DIAGNOSIS — Z794 Long term (current) use of insulin: Secondary | ICD-10-CM | POA: Diagnosis not present

## 2020-11-29 DIAGNOSIS — N3000 Acute cystitis without hematuria: Secondary | ICD-10-CM | POA: Diagnosis not present

## 2020-11-29 LAB — CULTURE, BLOOD (ROUTINE X 2)
Culture: NO GROWTH
Culture: NO GROWTH
Special Requests: ADEQUATE
Special Requests: ADEQUATE

## 2020-12-01 DIAGNOSIS — I10 Essential (primary) hypertension: Secondary | ICD-10-CM | POA: Diagnosis not present

## 2020-12-01 DIAGNOSIS — N1832 Chronic kidney disease, stage 3b: Secondary | ICD-10-CM | POA: Diagnosis not present

## 2020-12-01 DIAGNOSIS — F0391 Unspecified dementia with behavioral disturbance: Secondary | ICD-10-CM | POA: Diagnosis not present

## 2020-12-01 DIAGNOSIS — E1122 Type 2 diabetes mellitus with diabetic chronic kidney disease: Secondary | ICD-10-CM | POA: Diagnosis not present

## 2020-12-05 ENCOUNTER — Encounter: Payer: Self-pay | Admitting: Neurology

## 2020-12-05 DIAGNOSIS — R739 Hyperglycemia, unspecified: Secondary | ICD-10-CM | POA: Diagnosis not present

## 2020-12-05 DIAGNOSIS — E876 Hypokalemia: Secondary | ICD-10-CM | POA: Diagnosis not present

## 2020-12-05 DIAGNOSIS — G9341 Metabolic encephalopathy: Secondary | ICD-10-CM | POA: Diagnosis not present

## 2020-12-05 DIAGNOSIS — E1101 Type 2 diabetes mellitus with hyperosmolarity with coma: Secondary | ICD-10-CM | POA: Diagnosis not present

## 2020-12-05 DIAGNOSIS — N179 Acute kidney failure, unspecified: Secondary | ICD-10-CM | POA: Diagnosis not present

## 2020-12-07 NOTE — Progress Notes (Signed)
Assessment/Plan:    Dementia . Discussed the multiple causes of memory loss.  . Discussed safety both in and out of the home. Discussed the importance of regular daily schedule to maintain brain function.  . Continue to monitor mood with PCP.  Marland Kitchen Continue to stay active at least 3 times a week.  . Naps should be scheduled and should be no longer than 60 minutes and should not occur after 2 PM.  . Mediterranean diet is recommended  . Neurocognitive testing to further determine what causes memory changes including sleep, stress, anxiety depression . Will order B12, and TSH if not done recently. Daughter to confirm if this was done at [facility] . Will defer MRI plans for now given a recent CT head yielding negative results for acute findings. May need films after Neuropsychology evaluation.  . Recommend hearing evaluation   Subjective:    The patient is seen in neurologic consultation at the request of Laura Carol, MD for the evaluation of memory loss.  The patient is accompanied by daughter [name], [place], gerontologist, and Visual merchandiser of the [facility] program who supplements the history.  Until now, the patient has never been evaluated for memory loss.  The patient is a 88 y.o. 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  About 2 weeks ago, the patient was hospitalized for UTI and hypoglycemia, experiencing delirium, anxiety, tearfulness lasting for about 7 days, and after discharge, she was seen as an outpatient by her PCP.  At the time, she was "rocking back and forth ", "feeling uncomfortable ".  Her PCP placed her  on Seroquel, which "helped her a lot, and she is now able to process emotions, her tearfulness was resolved, and she is less anxious ".  The patient lives with her daughter, and until recently she had daytime help, but the patient refused to interact with her, therefore she has been at home, without any stimulating activities.  She also, according to her daughter, has been refusing to look into adult day programs, she "does not like to be around all people ".  She is looking into volunteering activities, which may be more satisfying to her, and less stressful.  Denies hallucinations or paranoia.  Lifelong, she had impulse control seizures, but these have been worsening over the last few years. Her daughter [name] does the finances at home, the patient no longer drives.  She also controls the patient's medications.  She cooks occasionally with her daughter supervision.  Her appetite is not as good, and she eats 50% of what is presented in front of her, so her daughter supplements the intake with boost.  Denies trouble swallowing.  She is ambulatory without difficulty, without a cane or walker. She is able to dress by herself, her daughter reports that although she takes "bird bath ", on a daily basis, she does not like to shower frequently anymore. The patient's bladder and bowel are under good control.  Denies headaches, trauma, or injuries to the head, double vision, numbness or tingling or unilateral weakness.  He has a history of arthritic pain, and was recently on meloxicam, which  her daughter stopped, as she was concerned that it would interfere with Seroquel. Denies tremors.      Allergies  Allergen Reactions  . Ace Inhibitors Swelling and Rash  . Dilaudid [Hydromorphone Hcl] Other (See Comments)    Hallucinations (auditory and visual)  . Latex Rash  . Tape Rash    Prefers PAPER TAPE, PLEASE!!    Current Outpatient Medications  Medication Instructions  . allopurinol (ZYLOPRIM) 100 mg, Oral,  2 times daily  . amLODipine (NORVASC) 10 mg, Oral, Daily  . augmented betamethasone dipropionate (DIPROLENE-AF) 5.09 % cream 1 application, Topical, Daily PRN  . blood glucose meter kit and supplies KIT Dispense based on patient and insurance preference. Use up to four times daily as directed. (FOR ICD-9 250.00, 250.01). For QAC - HS accuchecks.  . hydrALAZINE (APRESOLINE) 100 mg, Oral, Every 8 hours  . insulin aspart (NOVOLOG) 100 UNIT/ML FlexPen Before each meal 3 times a day, 140-199 - 2 units, 200-250 - 4 units, 251-299 - 6 units,  300-349 - 8 units,  350 or above 10 units. Insulin PEN if approved, provide syringes and needles if needed.  . Insulin Syringe-Needle U-100 25G X 1" 1 ML MISC For 4 times a day insulin SQ, 1 month supply. Diagnosis E11.65  . Lantus SoloStar 35 Units, Subcutaneous, Daily  . levETIRAcetam (KEPPRA XR) 500 MG 24 hr tablet TAKE TWO TABLETS BY MOUTH AT BEDTIME  . loratadine (CLARITIN) 10 mg, Oral, Daily PRN  . metoprolol tartrate (LOPRESSOR) 100 mg, Oral, 2 times daily  . mirtazapine (REMERON) 15 mg, Oral, Daily at bedtime  . omeprazole (PRILOSEC) 20 mg, Oral, Daily  . QUEtiapine (SEROQUEL) 25 mg, Oral, Daily at bedtime     VITALS:   Vitals:   12/08/20 1033  BP: (!) 177/71  Pulse: (!) 106  SpO2: 95%  Weight: 169 lb (76.7 kg)  Height: _0  (1.575 m)   No flowsheet data found.  HEENT:  Normocephalic, atraumatic. The mucous membranes are moist. The superficial temporal arteries are without ropiness or tenderness. Cardiovascular: Regular rate and rhythm. Lungs: Clear to auscultation bilaterally. Neck: There are no carotid bruits noted bilaterally.  NEUROLOGICAL:  Orientation: Oriented to self and to person, to place and state, not year "2012, winter"  Cranial nerves: There is good facial symmetry. Extraocular muscles are intact and visual fields are full to confrontational testing. Speech is fluent and clear. Soft palate rises symmetrically and there is no  tongue deviation. Hearing is decreased, but with the use of our office device it is intact to conversational tone. Tone: Tone is good throughout. Sensation: Sensation is intact to light touch and pinprick throughout. Vibration is intact at the bilateral big toe. There is no extinction with double simultaneous stimulation. There is no sensory dermatomal level identified. Coordination:  The patient has no difficulty with RAM's or FNF bilaterally. Motor: Strength is 5/5 in the bilateral upper and lower extremities. There is no pronator drift.  There are no fasciculations noted. DTR's: Deep tendon reflexes are 2/4 at the bilateral biceps, triceps, brachioradialis, patella and achilles.  Plantar responses are downgoing bilaterally. Gait and Station: The patient is able to ambulate without difficulty. The patient is able to heel toe walk without any difficulty. The patient is able to ambulate in a tandem fashion. The patient is able to stand in the Romberg position.      Total time spent on today's visit was 60 minutes, including both face-to-face time and nonface-to-face time.  Time included that spent  on review of records (prior notes available to me/labs/imaging if pertinent), discussing treatment and goals, answering patient's questions and coordinating care.  Cc:  Laura Carol, MD  Sharene Butters 12/08/2020 12:51 PM

## 2020-12-08 ENCOUNTER — Ambulatory Visit (INDEPENDENT_AMBULATORY_CARE_PROVIDER_SITE_OTHER): Payer: PPO | Admitting: Physician Assistant

## 2020-12-08 ENCOUNTER — Encounter: Payer: Self-pay | Admitting: Physician Assistant

## 2020-12-08 ENCOUNTER — Other Ambulatory Visit: Payer: Self-pay

## 2020-12-08 VITALS — BP 177/71 | HR 106 | Ht 62.0 in | Wt 169.0 lb

## 2020-12-08 DIAGNOSIS — R413 Other amnesia: Secondary | ICD-10-CM | POA: Diagnosis not present

## 2020-12-08 NOTE — Patient Instructions (Signed)
It was a pleasure to see you today at our office.   Recommendations:  Meds: Follow up after the neurocognitive exam  Neurocognitive evaluation at our office Check B12 and TSH at the lab if they were not recently performed   RECOMMENDATIONS FOR ALL PATIENTS WITH MEMORY PROBLEMS: 1. Continue to exercise (Recommend 30 minutes of walking everyday, or 3 hours every week) 2. Increase social interactions - continue going to [place] and enjoy social gatherings with friends and family 3. Eat healthy, avoid fried foods and eat more fruits and vegetables 4. Maintain adequate blood pressure, blood sugar, and blood cholesterol level. Reducing the risk of stroke and cardiovascular disease also helps promoting better memory. 5. Avoid stressful situations. Live a simple life and avoid aggravations. Organize your time and prepare for the next day in anticipation. 6. Sleep well, avoid any interruptions of sleep and avoid any distractions in the bedroom that may interfere with adequate sleep quality 7. Avoid sugar, avoid sweets as there is a strong link between excessive sugar intake, diabetes, and cognitive impairment We discussed the Mediterranean diet, which has been shown to help patients reduce the risk of progressive memory disorders and reduces cardiovascular risk. This includes eating fish, eat fruits and green leafy vegetables, nuts like almonds and hazelnuts, walnuts, and also use olive oil. Avoid fast foods and fried foods as much as possible. Avoid sweets and sugar as sugar use has been linked to worsening of memory function.  There is always a concern of gradual progression of memory problems. If this is the case, then we may need to adjust level of care according to patient needs. Support, both to the patient and caregiver, should then be put into place.      You have been referred for a neuropsychological evaluation (i.e., evaluation of memory and thinking abilities). Please bring someone with you  to this appointment if possible, as it is helpful for the doctor to hear from both you and another adult who knows you well. Please bring eyeglasses and hearing aids if you wear them.    The evaluation will take approximately 3 hours and has two parts:   . The first part is a clinical interview with the neuropsychologist (Dr. Melvyn Novas or Dr. Nicole Kindred). During the interview, the neuropsychologist will speak with you and the individual you brought to the appointment.    . The second part of the evaluation is testing with the doctor's technician Hinton Dyer or Maudie Mercury). During the testing, the technician will ask you to remember different types of material, solve problems, and answer some questionnaires. Your family member will not be present for this portion of the evaluation.   Please note: We must reserve several hours of the neuropsychologist's time and the psychometrician's time for your evaluation appointment. As such, there is a No-Show fee of $100. If you are unable to attend any of your appointments, please contact our office as soon as possible to reschedule.    FALL PRECAUTIONS: Be cautious when walking. Scan the area for obstacles that may increase the risk of trips and falls. When getting up in the mornings, sit up at the edge of the bed for a few minutes before getting out of bed. Consider elevating the bed at the head end to avoid drop of blood pressure when getting up. Walk always in a well-lit room (use night lights in the walls). Avoid area rugs or power cords from appliances in the middle of the walkways. Use a walker or a cane if  necessary and consider physical therapy for balance exercise. Get your eyesight checked regularly.     HOME SAFETY: Consider the safety of the kitchen when operating appliances like stoves, microwave oven, and blender. Consider having supervision and share cooking responsibilities until no longer able to participate in those. Accidents with firearms and other hazards in the  house should be identified and addressed as well.   ABILITY TO BE LEFT ALONE: If patient is unable to contact 911 operator, consider using LifeLine, or when the need is there, arrange for someone to stay with patients. Smoking is a fire hazard, consider supervision or cessation. Risk of wandering should be assessed by caregiver and if detected at any point, supervision and safe proof recommendations should be instituted.     Mediterranean Diet A Mediterranean diet refers to food and lifestyle choices that are based on the traditions of countries located on the [employer]. This way of eating has been shown to help prevent certain conditions and improve outcomes for people who have chronic diseases, like kidney disease and heart disease. What are tips for following this plan? Lifestyle   Cook and eat meals together with your family, when possible.  Drink enough fluid to keep your urine clear or pale yellow.  Be physically active every day. This includes:  Aerobic exercise like running or swimming.  Leisure activities like gardening, walking, or housework.  Get 7-8 hours of sleep each night.  If recommended by your health care provider, drink red wine in moderation. This means 1 glass a day for nonpregnant women and 2 glasses a day for men. A glass of wine equals 5 oz (150 mL). Reading food labels   Check the serving size of packaged foods. For foods such as rice and pasta, the serving size refers to the amount of cooked product, not dry.  Check the total fat in packaged foods. Avoid foods that have saturated fat or trans fats.  Check the ingredients list for added sugars, such as corn syrup. Shopping   At the grocery store, buy most of your food from the areas near the walls of the store. This includes:  Fresh fruits and vegetables (produce).  Grains, beans, nuts, and seeds. Some of these may be available in unpackaged forms or large amounts (in bulk).  Fresh  seafood.  Poultry and eggs.  Low-fat dairy products.  Buy whole ingredients instead of prepackaged foods.  Buy fresh fruits and vegetables in-season from local farmers markets.  Buy frozen fruits and vegetables in resealable bags.  If you do not have access to quality fresh seafood, buy precooked frozen shrimp or canned fish, such as tuna, salmon, or sardines.  Buy small amounts of raw or cooked vegetables, salads, or olives from the deli or salad bar at your store.  Stock your pantry so you always have certain foods on hand, such as olive oil, canned tuna, canned tomatoes, rice, pasta, and beans. Cooking   Cook foods with extra-virgin olive oil instead of using butter or other vegetable oils.  Have meat as a side dish, and have vegetables or grains as your main dish. This means having meat in small portions or adding small amounts of meat to foods like pasta or stew.  Use beans or vegetables instead of meat in common dishes like chili or lasagna.  Experiment with different cooking methods. Try roasting or broiling vegetables instead of steaming or sauteing them.  Add frozen vegetables to soups, stews, pasta, or rice.  Add nuts or  seeds for added healthy fat at each meal. You can add these to yogurt, salads, or vegetable dishes.  Marinate fish or vegetables using olive oil, lemon juice, garlic, and fresh herbs. Meal planning   Plan to eat 1 vegetarian meal one day each week. Try to work up to 2 vegetarian meals, if possible.  Eat seafood 2 or more times a week.  Have healthy snacks readily available, such as:  Vegetable sticks with hummus.  Greek yogurt.  Fruit and nut trail mix.  Eat balanced meals throughout the week. This includes:  Fruit: 2-3 servings a day  Vegetables: 4-5 servings a day  Low-fat dairy: 2 servings a day  Fish, poultry, or lean meat: 1 serving a day  Beans and legumes: 2 or more servings a week  Nuts and seeds: 1-2 servings a  day  Whole grains: 6-8 servings a day  Extra-virgin olive oil: 3-4 servings a day  Limit red meat and sweets to only a few servings a month What are my food choices?  Mediterranean diet  Recommended  Grains: Whole-grain pasta. Brown rice. Bulgar wheat. Polenta. Couscous. Whole-wheat bread. [place].  Vegetables: Artichokes. Beets. Broccoli. Cabbage. Carrots. Eggplant. Green beans. Chard. Kale. Spinach. Onions. Leeks. Peas. Squash. Tomatoes. Peppers. Radishes.  Fruits: Apples. Apricots. Avocado. Berries. Bananas. Cherries. Dates. Figs. Grapes. Lemons. Melon. Oranges. Peaches. Plums. Pomegranate.  Meats and other protein foods: Beans. Almonds. Sunflower seeds. Pine nuts. Peanuts. [place]. Salmon. Scallops. Shrimp. [place]. Tilapia. Clams. Oysters. Eggs.  Dairy: Low-fat milk. Cheese. Greek yogurt.  Beverages: Water. Red wine. Herbal tea.  Fats and oils: Extra virgin olive oil. Avocado oil. Grape seed oil.  Sweets and desserts: [place] yogurt with honey. Baked apples. Poached pears. Trail mix.  Seasoning and other foods: Basil. Cilantro. Coriander. Cumin. Mint. Parsley. Sage. Rosemary. Tarragon. Garlic. Oregano. Thyme. Pepper. Balsalmic vinegar. Tahini. Hummus. Tomato sauce. Olives. Mushrooms.  Limit these  Grains: Prepackaged pasta or rice dishes. Prepackaged cereal with added sugar.  Vegetables: Deep fried potatoes (french fries).  Fruits: Fruit canned in syrup.  Meats and other protein foods: Beef. Pork. Lamb. Poultry with skin. Hot dogs. Bernie[place] Salines.  Dairy: Ice cream. Sour cream. Whole milk.  Beverages: Juice. Sugar-sweetened soft drinks. Beer. Liquor and spirits.  Fats and oils: Butter. Canola oil. Vegetable oil. Beef fat (tallow). Lard.  Sweets and desserts: Cookies. Cakes. Pies. Candy.  Seasoning and other foods: Mayonnaise. Premade sauces and marinades.  The items listed may not be a complete list. Talk with your dietitian about what dietary choices are right for  you. Summary  The Mediterranean diet includes both food and lifestyle choices.  Eat a variety of fresh fruits and vegetables, beans, nuts, seeds, and whole grains.  Limit the amount of red meat and sweets that you eat.  Talk with your health care provider about whether it is safe for you to drink red wine in moderation. This means 1 glass a day for nonpregnant women and 2 glasses a day for men. A glass of wine equals 5 oz (150 mL). This information is not intended to replace advice given to you by your health care provider. Make sure you discuss any questions you have with your health care provider. Document Released: 03/08/2016 Document Revised: 04/10/2016 Document Reviewed: 03/08/2016 Elsevier Interactive Patient Education  2017 [employer].

## 2020-12-13 DIAGNOSIS — F05 Delirium due to known physiological condition: Secondary | ICD-10-CM | POA: Diagnosis not present

## 2020-12-13 DIAGNOSIS — M1030 Gout due to renal impairment, unspecified site: Secondary | ICD-10-CM | POA: Diagnosis not present

## 2020-12-13 DIAGNOSIS — K219 Gastro-esophageal reflux disease without esophagitis: Secondary | ICD-10-CM | POA: Diagnosis not present

## 2020-12-13 DIAGNOSIS — N1832 Chronic kidney disease, stage 3b: Secondary | ICD-10-CM | POA: Diagnosis not present

## 2020-12-13 DIAGNOSIS — N179 Acute kidney failure, unspecified: Secondary | ICD-10-CM | POA: Diagnosis not present

## 2020-12-13 DIAGNOSIS — F0391 Unspecified dementia with behavioral disturbance: Secondary | ICD-10-CM | POA: Diagnosis not present

## 2020-12-13 DIAGNOSIS — I1310 Hypertensive heart and chronic kidney disease without heart failure, with stage 1 through stage 4 chronic kidney disease, or unspecified chronic kidney disease: Secondary | ICD-10-CM | POA: Diagnosis not present

## 2020-12-13 DIAGNOSIS — I700 Atherosclerosis of aorta: Secondary | ICD-10-CM | POA: Diagnosis not present

## 2020-12-13 DIAGNOSIS — E1165 Type 2 diabetes mellitus with hyperglycemia: Secondary | ICD-10-CM | POA: Diagnosis not present

## 2020-12-13 DIAGNOSIS — Z9981 Dependence on supplemental oxygen: Secondary | ICD-10-CM | POA: Diagnosis not present

## 2020-12-13 DIAGNOSIS — H409 Unspecified glaucoma: Secondary | ICD-10-CM | POA: Diagnosis not present

## 2020-12-13 DIAGNOSIS — Z794 Long term (current) use of insulin: Secondary | ICD-10-CM | POA: Diagnosis not present

## 2020-12-13 DIAGNOSIS — Z853 Personal history of malignant neoplasm of breast: Secondary | ICD-10-CM | POA: Diagnosis not present

## 2020-12-13 DIAGNOSIS — E1122 Type 2 diabetes mellitus with diabetic chronic kidney disease: Secondary | ICD-10-CM | POA: Diagnosis not present

## 2020-12-13 DIAGNOSIS — N3000 Acute cystitis without hematuria: Secondary | ICD-10-CM | POA: Diagnosis not present

## 2020-12-13 DIAGNOSIS — G9341 Metabolic encephalopathy: Secondary | ICD-10-CM | POA: Diagnosis not present

## 2020-12-13 DIAGNOSIS — Z9181 History of falling: Secondary | ICD-10-CM | POA: Diagnosis not present

## 2020-12-13 DIAGNOSIS — Z87891 Personal history of nicotine dependence: Secondary | ICD-10-CM | POA: Diagnosis not present

## 2020-12-20 ENCOUNTER — Encounter: Payer: Self-pay | Admitting: Endocrinology

## 2020-12-20 ENCOUNTER — Ambulatory Visit (INDEPENDENT_AMBULATORY_CARE_PROVIDER_SITE_OTHER): Payer: PPO | Admitting: Endocrinology

## 2020-12-20 ENCOUNTER — Other Ambulatory Visit: Payer: Self-pay

## 2020-12-20 VITALS — BP 126/84 | HR 72 | Ht 62.0 in | Wt 171.4 lb

## 2020-12-20 DIAGNOSIS — R739 Hyperglycemia, unspecified: Secondary | ICD-10-CM | POA: Diagnosis not present

## 2020-12-20 LAB — POCT GLYCOSYLATED HEMOGLOBIN (HGB A1C): Hemoglobin A1C: 10.6 % — AB (ref 4.0–5.6)

## 2020-12-20 MED ORDER — LANTUS SOLOSTAR 100 UNIT/ML ~~LOC~~ SOPN: 5.0000 [IU] | PEN_INJECTOR | Freq: Every day | SUBCUTANEOUS | 5 refills | Status: DC

## 2020-12-20 MED ORDER — FREESTYLE LIBRE 2 SENSOR MISC: 1.0000 | 3 refills | Status: DC

## 2020-12-20 MED ORDER — REPAGLINIDE 0.5 MG PO TABS: 0.5000 mg | ORAL_TABLET | Freq: Two times a day (BID) | ORAL | 3 refills | Status: DC

## 2020-12-20 NOTE — Progress Notes (Signed)
Subjective:    Patient ID: [name], female    DOB: 01-Jul-1929, 87 y.o.   MRN: [id number]  HPI pt is referred by [name], for diabetes.  dtr [name], who lives with pt, checks cbg, and gives insulin) provides hx, due to pt's memory loss and hearing loss. 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  [name] reduced Lantus to 20 units qd, due to hypoglycemia.  She still frequently has to skip this, due to hypoglycemia.  I reviewed continuous glucose monitor data.  Glucose varies from 60-240.  It is in general lowest 2AM-8AM.  It is highest at 11AM and 7PM Past Medical History:  Diagnosis Date  . Dementia ([place])   . Diabetes mellitus   . GERD (gastroesophageal reflux disease)   . Glaucoma   . Heart disease   . Hypertension   . Tinnitus     Past Surgical History:  Procedure Laterality Date  . BREAST LUMPECTOMY     bil for breast ca    Social History   Socioeconomic History  . Marital status: Widowed    Spouse name: Not on file  . Number of children: Not on file  . Years of education: Not on file  . Highest education level: Not on file  Occupational History  . Occupation: retired  Tobacco Use  . Smoking status: Former Research scientist (life sciences)  . Smokeless tobacco: Never Used  Vaping Use  . Vaping Use: Never used  Substance and Sexual Activity  . Alcohol use: No    Comment: drinks caffeine daily  . Drug use: No  . Sexual activity: Not on file  Other Topics Concern  . Not on file  Social History Narrative   Patient lives and home with her daughter and she is retired. Patient has her masters. Patient quit tobacco in 1983. Right handed    Social Determinants of Health   Financial Resource Strain: Not on file  Food Insecurity: Not on file  Transportation Needs: Not on file  Physical Activity: Not  on file  Stress: Not on file  Social Connections: Not on file  Intimate Partner Violence: Not on file    Current Outpatient Medications on File Prior to Visit  Medication Sig Dispense Refill  . allopurinol (ZYLOPRIM) 100 MG tablet Take 100 mg by mouth 2 (two) times daily.    Marland Kitchen amLODipine (NORVASC) 10 MG tablet Take 10 mg by mouth daily.    Marland Kitchen augmented betamethasone dipropionate (DIPROLENE-AF) 0.05 % cream Apply 1 application topically daily as needed for dry skin.    Marland Kitchen blood glucose meter kit and supplies KIT Dispense based on patient and insurance preference. Use up to four times daily as directed. (FOR ICD-9 250.00, 250.01). For QAC - HS accuchecks. 1 each 1  . hydrALAZINE (APRESOLINE) 100 MG tablet Take 1 tablet (100 mg total) by mouth every 8 (eight) hours. 90 tablet 0  . Insulin Syringe-Needle U-100 25G X 1" 1 ML MISC For 4 times a day insulin SQ, 1 month supply. Diagnosis E11.65 30 each 0  . levETIRAcetam (KEPPRA XR) 500 MG 24 hr tablet TAKE TWO TABLETS BY MOUTH AT BEDTIME (Patient taking differently: Take 500 mg by mouth daily. Morning) 60 tablet 0  . loratadine (CLARITIN) 10 MG tablet Take 10 mg by mouth  daily as needed for allergies.    . metoprolol tartrate (LOPRESSOR) 100 MG tablet Take 100 mg by mouth 2 (two) times daily.    . mirtazapine (REMERON) 15 MG tablet Take 15 mg by mouth at bedtime.     Marland Kitchen omeprazole (PRILOSEC) 20 MG capsule Take 20 mg by mouth daily.    . QUEtiapine (SEROQUEL) 25 MG tablet Take 25 mg by mouth at bedtime.    . [DISCONTINUED] albuterol (PROVENTIL HFA;VENTOLIN HFA) 108 (90 BASE) MCG/ACT inhaler Inhale 2 puffs into the lungs every 2 (two) hours as needed for wheezing or shortness of breath (cough). (Patient not taking: No sig reported) 1 Inhaler 0   No current facility-administered medications on file prior to visit.    Allergies  Allergen Reactions  . Ace Inhibitors Swelling and Rash  . Dilaudid [Hydromorphone Hcl] Other (See Comments)     Hallucinations (auditory and visual)  . Latex Rash  . Tape Rash    Prefers PAPER TAPE, PLEASE!!    Family History  Problem Relation Age of Onset  . Diabetes Sister     BP 126/84 (BP Location: Right Arm, Patient Position: Sitting, Cuff Size: Large)   Pulse 72   Ht $R'5\' 2"'QU$  (1.575 m)   Wt 171 lb 6.4 oz (77.7 kg)   SpO2 95%   BMI 31.35 kg/m     Review of Systems denies n/v.  She has lost 10 lbs x 3 mos.      Objective:   Physical Exam VITAL SIGNS:  See vs page GENERAL: no distress Pulses: dorsalis pedis intact bilat.   MSK: no deformity of the feet CV: trace bilat leg edema.   Skin:  no ulcer on the feet.  normal color and temp on the feet. Neuro: sensation is intact to touch on the feet  Lab Results  Component Value Date   HGBA1C 12.5 (H) 11/24/2020    Lab Results  Component Value Date   CREATININE 1.39 (H) 11/27/2020   BUN 17 11/27/2020   NA 134 (L) 11/27/2020   K 3.9 11/27/2020   CL 96 (L) 11/27/2020   CO2 29 11/27/2020    I have reviewed outside records, and summarized: Pt was noted to have elevated A1c, and referred here.  Pt was noted to be noncompliant with cbg monitoring PTA     Assessment & Plan:  Type 2 DM: well-controlled.   SDOH: she is not a good insulin candidate, so we'll change to oral rx.   Patient Instructions  good diet and exercise significantly improve the control of your diabetes.  please let me know if you wish to be referred to a dietician.  high blood sugar is very risky to your health.  you should see an eye doctor and dentist every year.  It is very important to get all recommended vaccinations.  Controlling your blood pressure and cholesterol drastically reduces the damage diabetes does to your body.  Those who smoke should quit.  Please discuss these with your doctor.  check your blood sugar once a day.  vary the time of day when you check, between before the 3 meals, and at bedtime.  also check if you have symptoms of your blood  sugar being too high or too low.  please keep a record of the readings and bring it to your next appointment here (or you can bring the meter itself).  You can write it on any piece of paper.  please call us sooner if your blood sugar  goes below 70, or if most of your readings are over 200.   I have sent a prescription to your pharmacy, to start "repaglinide."  Take Lantus 5 units each morning, only if the blood sugar is over 200.   Please call or message [place] in a few days, to tell us how the blood sugar is doing.   Please come back for a follow-up appointment in 2 months.

## 2020-12-20 NOTE — Patient Instructions (Addendum)
good diet and exercise significantly improve the control of your diabetes.  please let me know if you wish to be referred to a dietician.  high blood sugar is very risky to your health.  you should see an eye doctor and dentist every year.  It is very important to get all recommended vaccinations.  Controlling your blood pressure and cholesterol drastically reduces the damage diabetes does to your body.  Those who smoke should quit.  Please discuss these with your doctor.  check your blood sugar once a day.  vary the time of day when you check, between before the 3 meals, and at bedtime.  also check if you have symptoms of your blood sugar being too high or too low.  please keep a record of the readings and bring it to your next appointment here (or you can bring the meter itself).  You can write it on any piece of paper.  please call us sooner if your blood sugar goes below 70, or if most of your readings are over 200.   I have sent a prescription to your pharmacy, to start "repaglinide."  Take Lantus 5 units each morning, only if the blood sugar is over 200.   Please call or message [place] in a few days, to tell us how the blood sugar is doing.   Please come back for a follow-up appointment in 2 months.

## 2020-12-26 DIAGNOSIS — E876 Hypokalemia: Secondary | ICD-10-CM | POA: Diagnosis not present

## 2020-12-26 DIAGNOSIS — E1101 Type 2 diabetes mellitus with hyperosmolarity with coma: Secondary | ICD-10-CM | POA: Diagnosis not present

## 2020-12-26 DIAGNOSIS — R739 Hyperglycemia, unspecified: Secondary | ICD-10-CM | POA: Diagnosis not present

## 2020-12-26 DIAGNOSIS — G9341 Metabolic encephalopathy: Secondary | ICD-10-CM | POA: Diagnosis not present

## 2020-12-26 DIAGNOSIS — N179 Acute kidney failure, unspecified: Secondary | ICD-10-CM | POA: Diagnosis not present

## 2020-12-29 ENCOUNTER — Ambulatory Visit
Admission: RE | Admit: 2020-12-29 | Discharge: 2020-12-29 | Disposition: A | Discharge status: Home / Self Care | Payer: PPO | Source: Ambulatory Visit | Attending: Internal Medicine | Admitting: Internal Medicine

## 2020-12-29 ENCOUNTER — Other Ambulatory Visit: Payer: Self-pay | Admitting: Internal Medicine

## 2020-12-29 DIAGNOSIS — J90 Pleural effusion, not elsewhere classified: Secondary | ICD-10-CM | POA: Diagnosis not present

## 2020-12-29 DIAGNOSIS — Z853 Personal history of malignant neoplasm of breast: Secondary | ICD-10-CM | POA: Diagnosis not present

## 2020-12-29 DIAGNOSIS — E8770 Fluid overload, unspecified: Secondary | ICD-10-CM | POA: Diagnosis not present

## 2020-12-29 DIAGNOSIS — G9341 Metabolic encephalopathy: Secondary | ICD-10-CM | POA: Diagnosis not present

## 2020-12-29 DIAGNOSIS — Z87891 Personal history of nicotine dependence: Secondary | ICD-10-CM | POA: Diagnosis not present

## 2020-12-29 DIAGNOSIS — K219 Gastro-esophageal reflux disease without esophagitis: Secondary | ICD-10-CM | POA: Diagnosis not present

## 2020-12-29 DIAGNOSIS — N3000 Acute cystitis without hematuria: Secondary | ICD-10-CM | POA: Diagnosis not present

## 2020-12-29 DIAGNOSIS — Z9181 History of falling: Secondary | ICD-10-CM | POA: Diagnosis not present

## 2020-12-29 DIAGNOSIS — Z794 Long term (current) use of insulin: Secondary | ICD-10-CM | POA: Diagnosis not present

## 2020-12-29 DIAGNOSIS — I700 Atherosclerosis of aorta: Secondary | ICD-10-CM | POA: Diagnosis not present

## 2020-12-29 DIAGNOSIS — H409 Unspecified glaucoma: Secondary | ICD-10-CM | POA: Diagnosis not present

## 2020-12-29 DIAGNOSIS — R0601 Orthopnea: Secondary | ICD-10-CM

## 2020-12-29 DIAGNOSIS — E1122 Type 2 diabetes mellitus with diabetic chronic kidney disease: Secondary | ICD-10-CM | POA: Diagnosis not present

## 2020-12-29 DIAGNOSIS — F0391 Unspecified dementia with behavioral disturbance: Secondary | ICD-10-CM | POA: Diagnosis not present

## 2020-12-29 DIAGNOSIS — N179 Acute kidney failure, unspecified: Secondary | ICD-10-CM | POA: Diagnosis not present

## 2020-12-29 DIAGNOSIS — N1832 Chronic kidney disease, stage 3b: Secondary | ICD-10-CM | POA: Diagnosis not present

## 2020-12-29 DIAGNOSIS — I517 Cardiomegaly: Secondary | ICD-10-CM | POA: Diagnosis not present

## 2020-12-29 DIAGNOSIS — E1165 Type 2 diabetes mellitus with hyperglycemia: Secondary | ICD-10-CM | POA: Diagnosis not present

## 2020-12-29 DIAGNOSIS — M1030 Gout due to renal impairment, unspecified site: Secondary | ICD-10-CM | POA: Diagnosis not present

## 2020-12-29 DIAGNOSIS — Z9981 Dependence on supplemental oxygen: Secondary | ICD-10-CM | POA: Diagnosis not present

## 2020-12-29 DIAGNOSIS — F05 Delirium due to known physiological condition: Secondary | ICD-10-CM | POA: Diagnosis not present

## 2020-12-29 DIAGNOSIS — I1310 Hypertensive heart and chronic kidney disease without heart failure, with stage 1 through stage 4 chronic kidney disease, or unspecified chronic kidney disease: Secondary | ICD-10-CM | POA: Diagnosis not present

## 2021-01-05 DIAGNOSIS — N179 Acute kidney failure, unspecified: Secondary | ICD-10-CM | POA: Diagnosis not present

## 2021-01-05 DIAGNOSIS — R739 Hyperglycemia, unspecified: Secondary | ICD-10-CM | POA: Diagnosis not present

## 2021-01-05 DIAGNOSIS — E1101 Type 2 diabetes mellitus with hyperosmolarity with coma: Secondary | ICD-10-CM | POA: Diagnosis not present

## 2021-01-05 DIAGNOSIS — G9341 Metabolic encephalopathy: Secondary | ICD-10-CM | POA: Diagnosis not present

## 2021-01-05 DIAGNOSIS — E876 Hypokalemia: Secondary | ICD-10-CM | POA: Diagnosis not present

## 2021-01-06 DIAGNOSIS — E1122 Type 2 diabetes mellitus with diabetic chronic kidney disease: Secondary | ICD-10-CM | POA: Diagnosis not present

## 2021-01-06 DIAGNOSIS — I509 Heart failure, unspecified: Secondary | ICD-10-CM | POA: Diagnosis not present

## 2021-01-06 DIAGNOSIS — I10 Essential (primary) hypertension: Secondary | ICD-10-CM | POA: Diagnosis not present

## 2021-01-06 DIAGNOSIS — G40909 Epilepsy, unspecified, not intractable, without status epilepticus: Secondary | ICD-10-CM | POA: Diagnosis not present

## 2021-01-09 ENCOUNTER — Encounter: Payer: Self-pay | Admitting: Counselor

## 2021-01-13 DIAGNOSIS — I509 Heart failure, unspecified: Secondary | ICD-10-CM | POA: Diagnosis not present

## 2021-01-20 ENCOUNTER — Encounter: Payer: Self-pay | Admitting: Counselor

## 2021-01-26 DIAGNOSIS — G9341 Metabolic encephalopathy: Secondary | ICD-10-CM | POA: Diagnosis not present

## 2021-01-26 DIAGNOSIS — N179 Acute kidney failure, unspecified: Secondary | ICD-10-CM | POA: Diagnosis not present

## 2021-01-26 DIAGNOSIS — E1101 Type 2 diabetes mellitus with hyperosmolarity with coma: Secondary | ICD-10-CM | POA: Diagnosis not present

## 2021-01-26 DIAGNOSIS — E876 Hypokalemia: Secondary | ICD-10-CM | POA: Diagnosis not present

## 2021-01-26 DIAGNOSIS — R739 Hyperglycemia, unspecified: Secondary | ICD-10-CM | POA: Diagnosis not present

## 2021-02-04 DIAGNOSIS — G9341 Metabolic encephalopathy: Secondary | ICD-10-CM | POA: Diagnosis not present

## 2021-02-04 DIAGNOSIS — N179 Acute kidney failure, unspecified: Secondary | ICD-10-CM | POA: Diagnosis not present

## 2021-02-04 DIAGNOSIS — R739 Hyperglycemia, unspecified: Secondary | ICD-10-CM | POA: Diagnosis not present

## 2021-02-04 DIAGNOSIS — E876 Hypokalemia: Secondary | ICD-10-CM | POA: Diagnosis not present

## 2021-02-04 DIAGNOSIS — E1101 Type 2 diabetes mellitus with hyperosmolarity with coma: Secondary | ICD-10-CM | POA: Diagnosis not present

## 2021-02-24 ENCOUNTER — Ambulatory Visit: Payer: PPO | Admitting: Neurology

## 2021-03-06 ENCOUNTER — Encounter: Payer: PPO | Admitting: Counselor

## 2021-03-07 DIAGNOSIS — I509 Heart failure, unspecified: Secondary | ICD-10-CM | POA: Diagnosis not present

## 2021-03-07 DIAGNOSIS — I10 Essential (primary) hypertension: Secondary | ICD-10-CM | POA: Diagnosis not present

## 2021-03-07 DIAGNOSIS — R399 Unspecified symptoms and signs involving the genitourinary system: Secondary | ICD-10-CM | POA: Diagnosis not present

## 2021-03-13 ENCOUNTER — Encounter: Payer: PPO | Admitting: Counselor

## 2021-03-20 ENCOUNTER — Ambulatory Visit: Payer: PPO | Admitting: Physician Assistant

## 2021-03-20 ENCOUNTER — Ambulatory Visit: Payer: PPO | Admitting: Endocrinology

## 2021-03-28 DIAGNOSIS — F0391 Unspecified dementia with behavioral disturbance: Secondary | ICD-10-CM | POA: Diagnosis not present

## 2021-03-28 DIAGNOSIS — E1122 Type 2 diabetes mellitus with diabetic chronic kidney disease: Secondary | ICD-10-CM | POA: Diagnosis not present

## 2021-03-28 DIAGNOSIS — I5189 Other ill-defined heart diseases: Secondary | ICD-10-CM | POA: Diagnosis not present

## 2021-03-28 DIAGNOSIS — N1832 Chronic kidney disease, stage 3b: Secondary | ICD-10-CM | POA: Diagnosis not present

## 2021-03-28 DIAGNOSIS — I10 Essential (primary) hypertension: Secondary | ICD-10-CM | POA: Diagnosis not present

## 2021-04-28 DIAGNOSIS — R739 Hyperglycemia, unspecified: Secondary | ICD-10-CM | POA: Diagnosis not present

## 2021-04-28 DIAGNOSIS — E1101 Type 2 diabetes mellitus with hyperosmolarity with coma: Secondary | ICD-10-CM | POA: Diagnosis not present

## 2021-04-28 DIAGNOSIS — G9341 Metabolic encephalopathy: Secondary | ICD-10-CM | POA: Diagnosis not present

## 2021-04-28 DIAGNOSIS — N179 Acute kidney failure, unspecified: Secondary | ICD-10-CM | POA: Diagnosis not present

## 2021-04-28 DIAGNOSIS — E876 Hypokalemia: Secondary | ICD-10-CM | POA: Diagnosis not present

## 2021-05-07 DIAGNOSIS — E876 Hypokalemia: Secondary | ICD-10-CM | POA: Diagnosis not present

## 2021-05-07 DIAGNOSIS — E1101 Type 2 diabetes mellitus with hyperosmolarity with coma: Secondary | ICD-10-CM | POA: Diagnosis not present

## 2021-05-07 DIAGNOSIS — N179 Acute kidney failure, unspecified: Secondary | ICD-10-CM | POA: Diagnosis not present

## 2021-05-07 DIAGNOSIS — R739 Hyperglycemia, unspecified: Secondary | ICD-10-CM | POA: Diagnosis not present

## 2021-05-07 DIAGNOSIS — G9341 Metabolic encephalopathy: Secondary | ICD-10-CM | POA: Diagnosis not present

## 2021-05-22 DIAGNOSIS — E1122 Type 2 diabetes mellitus with diabetic chronic kidney disease: Secondary | ICD-10-CM | POA: Diagnosis not present

## 2021-05-22 DIAGNOSIS — I5189 Other ill-defined heart diseases: Secondary | ICD-10-CM | POA: Diagnosis not present

## 2021-05-22 DIAGNOSIS — Z23 Encounter for immunization: Secondary | ICD-10-CM | POA: Diagnosis not present

## 2021-05-22 DIAGNOSIS — I10 Essential (primary) hypertension: Secondary | ICD-10-CM | POA: Diagnosis not present

## 2021-05-22 DIAGNOSIS — F03911 Unspecified dementia, unspecified severity, with agitation: Secondary | ICD-10-CM | POA: Diagnosis not present

## 2021-05-22 DIAGNOSIS — R600 Localized edema: Secondary | ICD-10-CM | POA: Diagnosis not present

## 2021-05-22 DIAGNOSIS — R202 Paresthesia of skin: Secondary | ICD-10-CM | POA: Diagnosis not present

## 2021-05-22 DIAGNOSIS — N1832 Chronic kidney disease, stage 3b: Secondary | ICD-10-CM | POA: Diagnosis not present

## 2021-05-22 DIAGNOSIS — R350 Frequency of micturition: Secondary | ICD-10-CM | POA: Diagnosis not present

## 2021-05-28 DIAGNOSIS — E1101 Type 2 diabetes mellitus with hyperosmolarity with coma: Secondary | ICD-10-CM | POA: Diagnosis not present

## 2021-05-28 DIAGNOSIS — R739 Hyperglycemia, unspecified: Secondary | ICD-10-CM | POA: Diagnosis not present

## 2021-05-28 DIAGNOSIS — N179 Acute kidney failure, unspecified: Secondary | ICD-10-CM | POA: Diagnosis not present

## 2021-05-28 DIAGNOSIS — G9341 Metabolic encephalopathy: Secondary | ICD-10-CM | POA: Diagnosis not present

## 2021-05-28 DIAGNOSIS — E876 Hypokalemia: Secondary | ICD-10-CM | POA: Diagnosis not present

## 2021-06-07 DIAGNOSIS — G9341 Metabolic encephalopathy: Secondary | ICD-10-CM | POA: Diagnosis not present

## 2021-06-07 DIAGNOSIS — E1101 Type 2 diabetes mellitus with hyperosmolarity with coma: Secondary | ICD-10-CM | POA: Diagnosis not present

## 2021-06-07 DIAGNOSIS — E876 Hypokalemia: Secondary | ICD-10-CM | POA: Diagnosis not present

## 2021-06-07 DIAGNOSIS — R739 Hyperglycemia, unspecified: Secondary | ICD-10-CM | POA: Diagnosis not present

## 2021-06-07 DIAGNOSIS — N179 Acute kidney failure, unspecified: Secondary | ICD-10-CM | POA: Diagnosis not present

## 2021-06-28 ENCOUNTER — Other Ambulatory Visit: Payer: Self-pay | Admitting: Internal Medicine

## 2021-06-28 ENCOUNTER — Ambulatory Visit
Admission: RE | Admit: 2021-06-28 | Discharge: 2021-06-28 | Disposition: A | Discharge status: Home / Self Care | Payer: PPO | Source: Ambulatory Visit | Attending: Internal Medicine | Admitting: Internal Medicine

## 2021-06-28 DIAGNOSIS — R519 Headache, unspecified: Secondary | ICD-10-CM | POA: Diagnosis not present

## 2021-06-28 DIAGNOSIS — G9341 Metabolic encephalopathy: Secondary | ICD-10-CM | POA: Diagnosis not present

## 2021-06-28 DIAGNOSIS — E1101 Type 2 diabetes mellitus with hyperosmolarity with coma: Secondary | ICD-10-CM | POA: Diagnosis not present

## 2021-06-28 DIAGNOSIS — M79673 Pain in unspecified foot: Secondary | ICD-10-CM | POA: Diagnosis not present

## 2021-06-28 DIAGNOSIS — M19011 Primary osteoarthritis, right shoulder: Secondary | ICD-10-CM | POA: Diagnosis not present

## 2021-06-28 DIAGNOSIS — M25511 Pain in right shoulder: Secondary | ICD-10-CM

## 2021-06-28 DIAGNOSIS — E876 Hypokalemia: Secondary | ICD-10-CM | POA: Diagnosis not present

## 2021-06-28 DIAGNOSIS — R739 Hyperglycemia, unspecified: Secondary | ICD-10-CM | POA: Diagnosis not present

## 2021-06-28 DIAGNOSIS — M85811 Other specified disorders of bone density and structure, right shoulder: Secondary | ICD-10-CM | POA: Diagnosis not present

## 2021-06-28 DIAGNOSIS — N179 Acute kidney failure, unspecified: Secondary | ICD-10-CM | POA: Diagnosis not present

## 2021-06-28 DIAGNOSIS — W19XXXA Unspecified fall, initial encounter: Secondary | ICD-10-CM | POA: Diagnosis not present

## 2021-08-07 DIAGNOSIS — R739 Hyperglycemia, unspecified: Secondary | ICD-10-CM | POA: Diagnosis not present

## 2021-08-07 DIAGNOSIS — N179 Acute kidney failure, unspecified: Secondary | ICD-10-CM | POA: Diagnosis not present

## 2021-08-07 DIAGNOSIS — G9341 Metabolic encephalopathy: Secondary | ICD-10-CM | POA: Diagnosis not present

## 2021-08-07 DIAGNOSIS — E876 Hypokalemia: Secondary | ICD-10-CM | POA: Diagnosis not present

## 2021-08-07 DIAGNOSIS — E1101 Type 2 diabetes mellitus with hyperosmolarity with coma: Secondary | ICD-10-CM | POA: Diagnosis not present

## 2021-08-23 DIAGNOSIS — R600 Localized edema: Secondary | ICD-10-CM | POA: Diagnosis not present

## 2021-08-23 DIAGNOSIS — R0989 Other specified symptoms and signs involving the circulatory and respiratory systems: Secondary | ICD-10-CM | POA: Diagnosis not present

## 2021-08-23 DIAGNOSIS — Z03818 Encounter for observation for suspected exposure to other biological agents ruled out: Secondary | ICD-10-CM | POA: Diagnosis not present

## 2021-08-23 DIAGNOSIS — R399 Unspecified symptoms and signs involving the genitourinary system: Secondary | ICD-10-CM | POA: Diagnosis not present

## 2021-09-20 ENCOUNTER — Encounter (HOSPITAL_COMMUNITY): Payer: Self-pay

## 2021-09-20 ENCOUNTER — Ambulatory Visit (INDEPENDENT_AMBULATORY_CARE_PROVIDER_SITE_OTHER): Payer: PPO

## 2021-09-20 ENCOUNTER — Ambulatory Visit (HOSPITAL_COMMUNITY): Payer: PPO

## 2021-09-20 ENCOUNTER — Ambulatory Visit (HOSPITAL_COMMUNITY)
Admission: EM | Admit: 2021-09-20 | Discharge: 2021-09-20 | Disposition: A | Discharge status: Home / Self Care | Payer: PPO | Attending: Family Medicine | Admitting: Family Medicine

## 2021-09-20 DIAGNOSIS — W19XXXA Unspecified fall, initial encounter: Secondary | ICD-10-CM

## 2021-09-20 DIAGNOSIS — R600 Localized edema: Secondary | ICD-10-CM | POA: Diagnosis not present

## 2021-09-20 DIAGNOSIS — M79631 Pain in right forearm: Secondary | ICD-10-CM | POA: Diagnosis not present

## 2021-09-20 DIAGNOSIS — M79601 Pain in right arm: Secondary | ICD-10-CM

## 2021-09-20 NOTE — Discharge Instructions (Signed)
Use tylenol if needed for pain

## 2021-09-20 NOTE — ED Triage Notes (Signed)
Pt presents with right arm pain and left elbow pain after fall approx 1 hr ago. States did not lose consciousness or hit head.

## 2021-09-25 DIAGNOSIS — E7800 Pure hypercholesterolemia, unspecified: Secondary | ICD-10-CM | POA: Diagnosis not present

## 2021-09-25 DIAGNOSIS — I5189 Other ill-defined heart diseases: Secondary | ICD-10-CM | POA: Diagnosis not present

## 2021-09-25 DIAGNOSIS — M129 Arthropathy, unspecified: Secondary | ICD-10-CM | POA: Diagnosis not present

## 2021-09-25 DIAGNOSIS — F03911 Unspecified dementia, unspecified severity, with agitation: Secondary | ICD-10-CM | POA: Diagnosis not present

## 2021-09-25 DIAGNOSIS — N1832 Chronic kidney disease, stage 3b: Secondary | ICD-10-CM | POA: Diagnosis not present

## 2021-09-25 DIAGNOSIS — G40909 Epilepsy, unspecified, not intractable, without status epilepticus: Secondary | ICD-10-CM | POA: Diagnosis not present

## 2021-09-25 DIAGNOSIS — E1122 Type 2 diabetes mellitus with diabetic chronic kidney disease: Secondary | ICD-10-CM | POA: Diagnosis not present

## 2021-09-25 DIAGNOSIS — I10 Essential (primary) hypertension: Secondary | ICD-10-CM | POA: Diagnosis not present

## 2021-09-29 NOTE — ED Provider Notes (Signed)
[place]    CSN: [id number] Arrival date & time: 09/20/21  1943      History   Chief Complaint Chief Complaint  Patient presents with   Fall   Arm Pain   Elbow Pain    HPI [name] is a 88 y.o. female.  Patient is here with her daughter.  Her daughter reports she does have some dementia.  Daughter reports she and patient were walking when patient tripped on the slightly raised part of sidewalk.  Patient complained to her daughter of right arm pain and left elbow pain after the fall.  Daughter reports the patient did not hit her head or lose consciousness.    Fall  Arm Pain   Past Medical History:  Diagnosis Date   Dementia ([place])    Diabetes mellitus    GERD (gastroesophageal reflux disease)    Glaucoma    Heart disease    Hypertension    Tinnitus     Patient Active Problem List   Diagnosis Date Noted   HHNC (hyperglycemic hyperosmolar nonketotic coma) ([place]) 11/24/2020   Hyperglycemia 11/24/2020   Acute lower UTI 56/38/7564   Acute metabolic encephalopathy 33/29/5188   Hypokalemia 11/24/2020   AKI (acute kidney injury) ([place]) 11/24/2020   GERD (gastroesophageal reflux disease)    Hypertension     Past Surgical History:  Procedure Laterality Date   BREAST LUMPECTOMY     bil for breast ca    OB History   No obstetric history on file.      Home Medications    Prior to Admission medications   Medication Sig Start Date End Date Taking? Authorizing Provider  allopurinol (ZYLOPRIM) 100 MG tablet Take 100 mg by mouth 2 (two) times daily.    [provider]  amLODipine (NORVASC) 10 MG tablet Take 10 mg by mouth daily.    [provider]  augmented betamethasone dipropionate (DIPROLENE-AF) 0.05 % cream Apply 1 application topically daily as needed for dry skin. 10/06/20   [provider]  blood glucose meter kit and supplies KIT Dispense based on patient and insurance preference. Use up to four times daily as  directed. (FOR ICD-9 250.00, 250.01). For QAC - HS accuchecks. 11/27/20   Thurnell Lose, MD  Continuous Blood Gluc Sensor (FREESTYLE LIBRE 2 SENSOR) MISC 1 Device by Does not apply route every 14 (fourteen) days. 12/20/20   Renato Shin, MD  hydrALAZINE (APRESOLINE) 100 MG tablet Take 1 tablet (100 mg total) by mouth every 8 (eight) hours. 11/27/20   Thurnell Lose, MD  insulin glargine (LANTUS SOLOSTAR) 100 UNIT/ML Solostar Pen Inject 5 Units into the skin daily. Take only if blood sugar is over 200 12/20/20   Renato Shin, MD  Insulin Syringe-Needle U-100 25G X 1" 1 ML MISC For 4 times a day insulin SQ, 1 month supply. Diagnosis E11.65 11/27/20   Thurnell Lose, MD  levETIRAcetam (KEPPRA XR) 500 MG 24 hr tablet TAKE TWO TABLETS BY MOUTH AT BEDTIME Patient taking differently: Take 500 mg by mouth daily. Morning    Marcial Pacas, MD  loratadine (CLARITIN) 10 MG tablet Take 10 mg by mouth daily as needed for allergies.    [provider]  metoprolol tartrate (LOPRESSOR) 100 MG tablet Take 100 mg by mouth 2 (two) times daily. 08/03/20   [provider]  mirtazapine (REMERON) 15 MG tablet Take 15 mg by mouth at bedtime.     [provider]  omeprazole (PRILOSEC) 20  MG capsule Take 20 mg by mouth daily.    [provider]  QUEtiapine (SEROQUEL) 25 MG tablet Take 25 mg by mouth at bedtime. 12/01/20   [provider]  repaglinide (PRANDIN) 0.5 MG tablet Take 1 tablet (0.5 mg total) by mouth 2 (two) times daily before a meal. 12/20/20   Renato Shin, MD  albuterol (PROVENTIL HFA;VENTOLIN HFA) 108 (90 BASE) MCG/ACT inhaler Inhale 2 puffs into the lungs every 2 (two) hours as needed for wheezing or shortness of breath (cough). Patient not taking: No sig reported 08/22/14 11/24/20  Charlesetta Shanks, MD    Family History Family History  Problem Relation Age of Onset   Diabetes Sister     Social History Social History   Tobacco Use   Smoking status: Former    Smokeless tobacco: Never  Scientific laboratory technician Use: Never used  Substance Use Topics   Alcohol use: No    Comment: drinks caffeine daily   Drug use: No     Allergies   Ace inhibitors, Dilaudid [hydromorphone hcl], Latex, and Tape   Review of Systems Review of Systems   Physical Exam Triage Vital Signs ED Triage Vitals  Enc Vitals Group     BP 09/20/21 1955 (!) 146/102     Pulse Rate 09/20/21 1955 77     Resp 09/20/21 1955 15     Temp 09/20/21 1955 98.3 F (36.8 C)     Temp Source 09/20/21 1955 Oral     SpO2 09/20/21 1955 100 %     Weight --      Height --      Head Circumference --      Peak Flow --      Pain Score 09/20/21 1953 7     Pain Loc --      Pain Edu? --      Excl. in [place]? --    No data found.  Updated Vital Signs BP (!) 146/102 (BP Location: Right Arm)    Pulse 77    Temp 98.3 F (36.8 C) (Oral)    Resp 15    SpO2 100%   Visual Acuity Right Eye Distance:   Left Eye Distance:   Bilateral Distance:    Right Eye Near:   Left Eye Near:    Bilateral Near:     Physical Exam Constitutional:      Appearance: Normal appearance.  HENT:     Head: Normocephalic and atraumatic.  Pulmonary:     Effort: Pulmonary effort is normal.  Musculoskeletal:     Right forearm: Normal.     Left forearm: Normal.     Comments: Pain in right forearm with range of motion.  No pain with palpation or range of motion of right elbow or wrist.  Skin:    Comments: Small abrasion on left elbow.    Neurological:     Mental Status: She is alert.     UC Treatments / Results  Labs (all labs ordered are listed, but only abnormal results are displayed) Labs Reviewed - No data to display  EKG   Radiology R forearm:  1. No fracture of the right forearm. 2. Soft tissue edema.  Procedures Procedures (including critical care time)  Medications Ordered in UC Medications - No data to display  Initial Impression / Assessment and Plan / UC Course  I have reviewed the  triage vital signs and the nursing notes.  Pertinent labs & imaging results that were available during  my care of the patient were reviewed by me and considered in my medical decision making (see chart for details).    X-ray did not show fracture.  Reviewed supportive care measures.  Final Clinical Impressions(s) / UC Diagnoses   Final diagnoses:  Right arm pain  Fall, initial encounter     Discharge Instructions      Use tylenol if needed for pain    ED Prescriptions   None    PDMP not reviewed this encounter.   Carvel Getting, NP 09/29/21 [phone]

## 2021-11-20 ENCOUNTER — Other Ambulatory Visit: Payer: Self-pay | Admitting: Endocrinology

## 2021-11-23 DIAGNOSIS — I5189 Other ill-defined heart diseases: Secondary | ICD-10-CM | POA: Diagnosis not present

## 2021-11-23 DIAGNOSIS — N1832 Chronic kidney disease, stage 3b: Secondary | ICD-10-CM | POA: Diagnosis not present

## 2021-11-23 DIAGNOSIS — F03911 Unspecified dementia, unspecified severity, with agitation: Secondary | ICD-10-CM | POA: Diagnosis not present

## 2021-11-23 DIAGNOSIS — E1122 Type 2 diabetes mellitus with diabetic chronic kidney disease: Secondary | ICD-10-CM | POA: Diagnosis not present

## 2021-12-26 DIAGNOSIS — R350 Frequency of micturition: Secondary | ICD-10-CM | POA: Diagnosis not present

## 2021-12-26 DIAGNOSIS — R519 Headache, unspecified: Secondary | ICD-10-CM | POA: Diagnosis not present

## 2022-03-10 ENCOUNTER — Encounter (HOSPITAL_BASED_OUTPATIENT_CLINIC_OR_DEPARTMENT_OTHER): Payer: Self-pay

## 2022-03-10 ENCOUNTER — Emergency Department (HOSPITAL_BASED_OUTPATIENT_CLINIC_OR_DEPARTMENT_OTHER): Payer: PPO

## 2022-03-10 ENCOUNTER — Inpatient Hospital Stay (HOSPITAL_BASED_OUTPATIENT_CLINIC_OR_DEPARTMENT_OTHER)
Admission: EM | Admit: 2022-03-10 | Discharge: 2022-03-16 | DRG: 177 | Disposition: A | Discharge status: Home Health | Payer: PPO | Attending: Internal Medicine | Admitting: Internal Medicine

## 2022-03-10 ENCOUNTER — Encounter (HOSPITAL_COMMUNITY): Payer: Self-pay

## 2022-03-10 ENCOUNTER — Other Ambulatory Visit: Payer: Self-pay

## 2022-03-10 DIAGNOSIS — Z7984 Long term (current) use of oral hypoglycemic drugs: Secondary | ICD-10-CM | POA: Diagnosis not present

## 2022-03-10 DIAGNOSIS — U071 COVID-19: Principal | ICD-10-CM

## 2022-03-10 DIAGNOSIS — Z888 Allergy status to other drugs, medicaments and biological substances status: Secondary | ICD-10-CM

## 2022-03-10 DIAGNOSIS — J159 Unspecified bacterial pneumonia: Secondary | ICD-10-CM | POA: Diagnosis not present

## 2022-03-10 DIAGNOSIS — Z79899 Other long term (current) drug therapy: Secondary | ICD-10-CM

## 2022-03-10 DIAGNOSIS — J1282 Pneumonia due to coronavirus disease 2019: Secondary | ICD-10-CM

## 2022-03-10 DIAGNOSIS — H9190 Unspecified hearing loss, unspecified ear: Secondary | ICD-10-CM | POA: Diagnosis not present

## 2022-03-10 DIAGNOSIS — N390 Urinary tract infection, site not specified: Secondary | ICD-10-CM | POA: Diagnosis not present

## 2022-03-10 DIAGNOSIS — Z91040 Latex allergy status: Secondary | ICD-10-CM

## 2022-03-10 DIAGNOSIS — I509 Heart failure, unspecified: Secondary | ICD-10-CM | POA: Insufficient documentation

## 2022-03-10 DIAGNOSIS — N184 Chronic kidney disease, stage 4 (severe): Secondary | ICD-10-CM | POA: Diagnosis present

## 2022-03-10 DIAGNOSIS — E869 Volume depletion, unspecified: Secondary | ICD-10-CM | POA: Diagnosis not present

## 2022-03-10 DIAGNOSIS — Z87440 Personal history of urinary (tract) infections: Secondary | ICD-10-CM

## 2022-03-10 DIAGNOSIS — Z87891 Personal history of nicotine dependence: Secondary | ICD-10-CM

## 2022-03-10 DIAGNOSIS — Z91048 Other nonmedicinal substance allergy status: Secondary | ICD-10-CM

## 2022-03-10 DIAGNOSIS — I130 Hypertensive heart and chronic kidney disease with heart failure and stage 1 through stage 4 chronic kidney disease, or unspecified chronic kidney disease: Secondary | ICD-10-CM | POA: Diagnosis present

## 2022-03-10 DIAGNOSIS — E871 Hypo-osmolality and hyponatremia: Secondary | ICD-10-CM | POA: Diagnosis not present

## 2022-03-10 DIAGNOSIS — H409 Unspecified glaucoma: Secondary | ICD-10-CM | POA: Diagnosis not present

## 2022-03-10 DIAGNOSIS — Z833 Family history of diabetes mellitus: Secondary | ICD-10-CM

## 2022-03-10 DIAGNOSIS — N179 Acute kidney failure, unspecified: Secondary | ICD-10-CM | POA: Diagnosis not present

## 2022-03-10 DIAGNOSIS — Z885 Allergy status to narcotic agent status: Secondary | ICD-10-CM

## 2022-03-10 DIAGNOSIS — F0390 Unspecified dementia without behavioral disturbance: Secondary | ICD-10-CM

## 2022-03-10 DIAGNOSIS — R0602 Shortness of breath: Secondary | ICD-10-CM | POA: Diagnosis not present

## 2022-03-10 DIAGNOSIS — R059 Cough, unspecified: Secondary | ICD-10-CM | POA: Diagnosis not present

## 2022-03-10 DIAGNOSIS — M109 Gout, unspecified: Secondary | ICD-10-CM | POA: Diagnosis not present

## 2022-03-10 DIAGNOSIS — F0393 Unspecified dementia, unspecified severity, with mood disturbance: Secondary | ICD-10-CM | POA: Diagnosis not present

## 2022-03-10 DIAGNOSIS — N1832 Chronic kidney disease, stage 3b: Secondary | ICD-10-CM | POA: Insufficient documentation

## 2022-03-10 DIAGNOSIS — E1122 Type 2 diabetes mellitus with diabetic chronic kidney disease: Secondary | ICD-10-CM | POA: Diagnosis present

## 2022-03-10 DIAGNOSIS — I5033 Acute on chronic diastolic (congestive) heart failure: Secondary | ICD-10-CM | POA: Diagnosis not present

## 2022-03-10 DIAGNOSIS — D631 Anemia in chronic kidney disease: Secondary | ICD-10-CM | POA: Diagnosis not present

## 2022-03-10 DIAGNOSIS — D649 Anemia, unspecified: Secondary | ICD-10-CM | POA: Diagnosis not present

## 2022-03-10 DIAGNOSIS — J189 Pneumonia, unspecified organism: Secondary | ICD-10-CM

## 2022-03-10 DIAGNOSIS — E118 Type 2 diabetes mellitus with unspecified complications: Secondary | ICD-10-CM

## 2022-03-10 DIAGNOSIS — N281 Cyst of kidney, acquired: Secondary | ICD-10-CM | POA: Diagnosis not present

## 2022-03-10 DIAGNOSIS — K219 Gastro-esophageal reflux disease without esophagitis: Secondary | ICD-10-CM | POA: Diagnosis present

## 2022-03-10 DIAGNOSIS — B961 Klebsiella pneumoniae [K. pneumoniae] as the cause of diseases classified elsewhere: Secondary | ICD-10-CM | POA: Diagnosis present

## 2022-03-10 DIAGNOSIS — D72819 Decreased white blood cell count, unspecified: Secondary | ICD-10-CM | POA: Diagnosis present

## 2022-03-10 DIAGNOSIS — J9601 Acute respiratory failure with hypoxia: Secondary | ICD-10-CM | POA: Diagnosis not present

## 2022-03-10 DIAGNOSIS — I129 Hypertensive chronic kidney disease with stage 1 through stage 4 chronic kidney disease, or unspecified chronic kidney disease: Secondary | ICD-10-CM | POA: Diagnosis not present

## 2022-03-10 DIAGNOSIS — Z794 Long term (current) use of insulin: Secondary | ICD-10-CM

## 2022-03-10 DIAGNOSIS — N250 Renal osteodystrophy: Secondary | ICD-10-CM | POA: Diagnosis not present

## 2022-03-10 DIAGNOSIS — N189 Chronic kidney disease, unspecified: Secondary | ICD-10-CM | POA: Diagnosis not present

## 2022-03-10 LAB — MAGNESIUM: Magnesium: 1.7 mg/dL (ref 1.7–2.4)

## 2022-03-10 LAB — BRAIN NATRIURETIC PEPTIDE: B Natriuretic Peptide: 1131.3 pg/mL — ABNORMAL HIGH (ref 0.0–100.0)

## 2022-03-10 LAB — GLUCOSE, CAPILLARY
Glucose-Capillary: 159 mg/dL — ABNORMAL HIGH (ref 70–99)
Glucose-Capillary: 131 mg/dL — ABNORMAL HIGH (ref 70–99)

## 2022-03-10 LAB — URINALYSIS, ROUTINE W REFLEX MICROSCOPIC
Bilirubin Urine: NEGATIVE
Glucose, UA: NEGATIVE mg/dL
Hgb urine dipstick: NEGATIVE
Ketones, ur: NEGATIVE mg/dL
Nitrite: NEGATIVE
Protein, ur: >300 mg/dL — AB
Specific Gravity, Urine: 1.013 (ref 1.005–1.030)
WBC, UA: >50 WBC/hpf — ABNORMAL HIGH (ref 0–5)
pH: 6 (ref 5.0–8.0)

## 2022-03-10 LAB — CBC WITH DIFFERENTIAL/PLATELET
Abs Immature Granulocytes: 0.03 10*3/uL (ref 0.00–0.07)
Basophils Absolute: 0 10*3/uL (ref 0.0–0.1)
Basophils Relative: 0 %
Eosinophils Absolute: 0.1 10*3/uL (ref 0.0–0.5)
Eosinophils Relative: 1 %
HCT: 34.3 % — ABNORMAL LOW (ref 36.0–46.0)
Hemoglobin: 11.1 g/dL — ABNORMAL LOW (ref 12.0–15.0)
Immature Granulocytes: 1 %
Lymphocytes Relative: 10 %
Lymphs Abs: 0.6 10*3/uL — ABNORMAL LOW (ref 0.7–4.0)
MCH: 28.8 pg (ref 26.0–34.0)
MCHC: 32.4 g/dL (ref 30.0–36.0)
MCV: 88.9 fL (ref 80.0–100.0)
Monocytes Absolute: 0.7 10*3/uL (ref 0.1–1.0)
Monocytes Relative: 12 %
Neutro Abs: 4.3 10*3/uL (ref 1.7–7.7)
Neutrophils Relative %: 76 %
Platelets: 243 10*3/uL (ref 150–400)
RBC: 3.86 MIL/uL — ABNORMAL LOW (ref 3.87–5.11)
RDW: 15.1 % (ref 11.5–15.5)
WBC: 5.7 10*3/uL (ref 4.0–10.5)
nRBC: 0 % (ref 0.0–0.2)

## 2022-03-10 LAB — HEMOGLOBIN A1C
Hgb A1c MFr Bld: 6.4 % — ABNORMAL HIGH (ref 4.8–5.6)
Mean Plasma Glucose: 136.98 mg/dL

## 2022-03-10 LAB — BASIC METABOLIC PANEL
Anion gap: 9 (ref 5–15)
BUN: 24 mg/dL — ABNORMAL HIGH (ref 8–23)
CO2: 26 mmol/L (ref 22–32)
Calcium: 9.2 mg/dL (ref 8.9–10.3)
Chloride: 99 mmol/L (ref 98–111)
Creatinine, Ser: 1.97 mg/dL — ABNORMAL HIGH (ref 0.44–1.00)
GFR, Estimated: 24 mL/min — ABNORMAL LOW (ref >60)
Glucose, Bld: 112 mg/dL — ABNORMAL HIGH (ref 70–99)
Potassium: 3.7 mmol/L (ref 3.5–5.1)
Sodium: 134 mmol/L — ABNORMAL LOW (ref 135–145)

## 2022-03-10 LAB — CREATININE, SERUM
Creatinine, Ser: 1.95 mg/dL — ABNORMAL HIGH (ref 0.44–1.00)
GFR, Estimated: 24 mL/min — ABNORMAL LOW (ref >60)

## 2022-03-10 LAB — STREP PNEUMONIAE URINARY ANTIGEN: Strep Pneumo Urinary Antigen: NEGATIVE

## 2022-03-10 LAB — C-REACTIVE PROTEIN: CRP: 1.6 mg/dL — ABNORMAL HIGH (ref <1.0)

## 2022-03-10 LAB — PROCALCITONIN: Procalcitonin: <0.1 ng/mL

## 2022-03-10 LAB — D-DIMER, QUANTITATIVE: D-Dimer, Quant: 1.93 ug/mL-FEU — ABNORMAL HIGH (ref 0.00–0.50)

## 2022-03-10 LAB — FERRITIN: Ferritin: 80 ng/mL (ref 11–307)

## 2022-03-10 LAB — PHOSPHORUS: Phosphorus: 4 mg/dL (ref 2.5–4.6)

## 2022-03-10 LAB — SARS CORONAVIRUS 2 BY RT PCR: SARS Coronavirus 2 by RT PCR: POSITIVE — AB

## 2022-03-10 MED ORDER — SENNOSIDES-DOCUSATE SODIUM 8.6-50 MG PO TABS: 1.0000 | ORAL_TABLET | Freq: Every evening | ORAL | Status: DC | PRN

## 2022-03-10 MED ORDER — ALLOPURINOL 100 MG PO TABS
100.0000 mg | ORAL_TABLET | Freq: Two times a day (BID) | ORAL | Status: DC
  Administered 2022-03-10 – 2022-03-16 (×12): 100 mg via ORAL
  Filled 2022-03-10 (×12): qty 1

## 2022-03-10 MED ORDER — METOPROLOL TARTRATE 50 MG PO TABS
100.0000 mg | ORAL_TABLET | Freq: Two times a day (BID) | ORAL | Status: DC
Start: 2022-03-10 — End: 2022-03-16
  Administered 2022-03-10 – 2022-03-16 (×12): 100 mg via ORAL
  Filled 2022-03-10 (×12): qty 2

## 2022-03-10 MED ORDER — FUROSEMIDE 10 MG/ML IJ SOLN
40.0000 mg | Freq: Every day | INTRAMUSCULAR | Status: DC
Start: 2022-03-10 — End: 2022-03-10

## 2022-03-10 MED ORDER — AMLODIPINE BESYLATE 10 MG PO TABS
10.0000 mg | ORAL_TABLET | Freq: Every day | ORAL | Status: DC
  Administered 2022-03-11 – 2022-03-16 (×6): 10 mg via ORAL
  Filled 2022-03-10 (×6): qty 1

## 2022-03-10 MED ORDER — HEPARIN SODIUM (PORCINE) 5000 UNIT/ML IJ SOLN
5000.0000 [IU] | Freq: Three times a day (TID) | INTRAMUSCULAR | Status: DC
  Administered 2022-03-10 – 2022-03-16 (×17): 5000 [IU] via SUBCUTANEOUS
  Filled 2022-03-10 (×18): qty 1

## 2022-03-10 MED ORDER — GUAIFENESIN ER 600 MG PO TB12
600.0000 mg | ORAL_TABLET | Freq: Two times a day (BID) | ORAL | Status: DC
  Administered 2022-03-10 – 2022-03-16 (×12): 600 mg via ORAL
  Filled 2022-03-10 (×12): qty 1

## 2022-03-10 MED ORDER — INSULIN ASPART 100 UNIT/ML IJ SOLN
0.0000 [IU] | Freq: Three times a day (TID) | INTRAMUSCULAR | Status: DC
  Administered 2022-03-10: 3 [IU] via SUBCUTANEOUS
  Administered 2022-03-11: 2 [IU] via SUBCUTANEOUS
  Administered 2022-03-11: 3 [IU] via SUBCUTANEOUS
  Administered 2022-03-11 – 2022-03-13 (×3): 2 [IU] via SUBCUTANEOUS
  Administered 2022-03-13: 3 [IU] via SUBCUTANEOUS
  Administered 2022-03-14 – 2022-03-15 (×2): 5 [IU] via SUBCUTANEOUS

## 2022-03-10 MED ORDER — SODIUM CHLORIDE 0.9% FLUSH
3.0000 mL | Freq: Two times a day (BID) | INTRAVENOUS | Status: DC
  Administered 2022-03-10 – 2022-03-16 (×10): 3 mL via INTRAVENOUS

## 2022-03-10 MED ORDER — SODIUM CHLORIDE 0.9% FLUSH: 3.0000 mL | INTRAVENOUS | Status: DC | PRN

## 2022-03-10 MED ORDER — QUETIAPINE FUMARATE 25 MG PO TABS
25.0000 mg | ORAL_TABLET | Freq: Every day | ORAL | Status: DC
  Administered 2022-03-10 – 2022-03-15 (×6): 25 mg via ORAL
  Filled 2022-03-10 (×6): qty 1

## 2022-03-10 MED ORDER — SODIUM CHLORIDE 0.9 % IV SOLN
250.0000 mL | INTRAVENOUS | Status: DC | PRN
  Administered 2022-03-14: 250 mL via INTRAVENOUS

## 2022-03-10 MED ORDER — ONDANSETRON HCL 4 MG/2ML IJ SOLN: 4.0000 mg | Freq: Four times a day (QID) | INTRAMUSCULAR | Status: DC | PRN

## 2022-03-10 MED ORDER — ONDANSETRON HCL 4 MG PO TABS: 4.0000 mg | ORAL_TABLET | Freq: Four times a day (QID) | ORAL | Status: DC | PRN

## 2022-03-10 MED ORDER — MOLNUPIRAVIR EUA 200MG CAPSULE
4.0000 | ORAL_CAPSULE | Freq: Two times a day (BID) | ORAL | Status: AC
  Administered 2022-03-10 – 2022-03-14 (×9): 800 mg via ORAL
  Filled 2022-03-10 (×2): qty 4

## 2022-03-10 MED ORDER — SODIUM CHLORIDE 0.9 % IV SOLN
2.0000 g | INTRAVENOUS | Status: DC
  Administered 2022-03-10 – 2022-03-13 (×4): 2 g via INTRAVENOUS
  Filled 2022-03-10 (×5): qty 20

## 2022-03-10 MED ORDER — GUAIFENESIN-DM 100-10 MG/5ML PO SYRP
10.0000 mL | ORAL_SOLUTION | ORAL | Status: DC | PRN
  Administered 2022-03-12: 10 mL via ORAL
  Filled 2022-03-10: qty 10

## 2022-03-10 MED ORDER — SODIUM CHLORIDE 0.9 % IV BOLUS
500.0000 mL | Freq: Once | INTRAVENOUS | Status: AC
  Administered 2022-03-10: 500 mL via INTRAVENOUS

## 2022-03-10 MED ORDER — LEVETIRACETAM ER 500 MG PO TB24: 500.0000 mg | ORAL_TABLET | Freq: Every day | ORAL | Status: DC

## 2022-03-10 MED ORDER — ACETAMINOPHEN 325 MG PO TABS
650.0000 mg | ORAL_TABLET | Freq: Four times a day (QID) | ORAL | Status: DC | PRN
  Administered 2022-03-12 – 2022-03-15 (×3): 650 mg via ORAL
  Filled 2022-03-10 (×3): qty 2

## 2022-03-10 MED ORDER — ALBUTEROL SULFATE HFA 108 (90 BASE) MCG/ACT IN AERS
2.0000 | INHALATION_SPRAY | Freq: Two times a day (BID) | RESPIRATORY_TRACT | Status: DC
  Administered 2022-03-11 – 2022-03-16 (×11): 2 via RESPIRATORY_TRACT
  Filled 2022-03-10: qty 6.7

## 2022-03-10 MED ORDER — FUROSEMIDE 10 MG/ML IJ SOLN
20.0000 mg | Freq: Every day | INTRAMUSCULAR | Status: DC
  Administered 2022-03-10: 20 mg via INTRAVENOUS
  Filled 2022-03-10: qty 2

## 2022-03-10 MED ORDER — DEXAMETHASONE 4 MG PO TABS
6.0000 mg | ORAL_TABLET | Freq: Every day | ORAL | Status: DC
  Administered 2022-03-10 – 2022-03-16 (×7): 6 mg via ORAL
  Filled 2022-03-10 (×5): qty 1
  Filled 2022-03-10: qty 2
  Filled 2022-03-10: qty 1

## 2022-03-10 MED ORDER — MIRTAZAPINE 15 MG PO TABS
15.0000 mg | ORAL_TABLET | Freq: Every day | ORAL | Status: DC
  Administered 2022-03-10 – 2022-03-15 (×6): 15 mg via ORAL
  Filled 2022-03-10 (×6): qty 1

## 2022-03-10 MED ORDER — ACETAMINOPHEN 650 MG RE SUPP: 650.0000 mg | Freq: Four times a day (QID) | RECTAL | Status: DC | PRN

## 2022-03-10 MED ORDER — ALBUTEROL SULFATE HFA 108 (90 BASE) MCG/ACT IN AERS
2.0000 | INHALATION_SPRAY | Freq: Four times a day (QID) | RESPIRATORY_TRACT | Status: DC
  Administered 2022-03-10: 2 via RESPIRATORY_TRACT
  Filled 2022-03-10: qty 6.7

## 2022-03-10 MED ORDER — ASCORBIC ACID 500 MG PO TABS
500.0000 mg | ORAL_TABLET | Freq: Every day | ORAL | Status: DC
Start: 2022-03-10 — End: 2022-03-16
  Administered 2022-03-10 – 2022-03-16 (×7): 500 mg via ORAL
  Filled 2022-03-10 (×7): qty 1

## 2022-03-10 MED ORDER — ZINC SULFATE 220 (50 ZN) MG PO CAPS
220.0000 mg | ORAL_CAPSULE | Freq: Every day | ORAL | Status: DC
  Administered 2022-03-10 – 2022-03-16 (×7): 220 mg via ORAL
  Filled 2022-03-10 (×7): qty 1

## 2022-03-10 MED ORDER — SODIUM CHLORIDE 0.9 % IV SOLN
500.0000 mg | INTRAVENOUS | Status: DC
  Administered 2022-03-11 – 2022-03-13 (×3): 500 mg via INTRAVENOUS
  Filled 2022-03-10 (×4): qty 5

## 2022-03-10 MED ORDER — INSULIN ASPART 100 UNIT/ML IJ SOLN: 0.0000 [IU] | Freq: Three times a day (TID) | INTRAMUSCULAR | Status: DC

## 2022-03-10 MED ORDER — INSULIN ASPART 100 UNIT/ML IJ SOLN: 0.0000 [IU] | Freq: Every day | INTRAMUSCULAR | Status: DC

## 2022-03-10 MED ORDER — LEVETIRACETAM 500 MG PO TABS
500.0000 mg | ORAL_TABLET | Freq: Every day | ORAL | Status: DC
  Administered 2022-03-11 – 2022-03-16 (×6): 500 mg via ORAL
  Filled 2022-03-10 (×6): qty 1

## 2022-03-10 MED ORDER — HYDRALAZINE HCL 50 MG PO TABS
100.0000 mg | ORAL_TABLET | Freq: Three times a day (TID) | ORAL | Status: DC
  Administered 2022-03-10 – 2022-03-16 (×17): 100 mg via ORAL
  Filled 2022-03-10 (×17): qty 2

## 2022-03-10 NOTE — ED Notes (Signed)
RT note: Pt. seen in Respiratory Isolation area with her  daughter at side P-60, RR-16, P.O.- 92% in no noted distress at this time.

## 2022-03-10 NOTE — ED Notes (Signed)
RT Note: Pt. given X2 puffs Albuterol via Inhaler(COVID +) with face mask/spacer, Incentive Spirometry performed very well, Flutter device done with some assistance, Sputum cup labelled with pt. sticker, unable to provide sample at this time, humidity placed on oxygen prior to [facility] initiating transport along with pt./daughter inquired if pt. was able to be in Prone position if need and was made aware that she  would not be able due to current medical condition. [facility] Staff notified of pt. being transferred via [place] for admission.

## 2022-03-10 NOTE — ED Triage Notes (Signed)
Pt's daughter said she tested positive for covid on Thursday., she states her mother has since developed weakness, and generalized body aches. States symptoms started Wednesday and states she cannot stay awake. Denies fever,vomiting and or diarrhea. Continent of bowel and bladder.

## 2022-03-10 NOTE — H&P (Addendum)
History and Physical  [name]:[id number] DOB: 08-Mar-1929 DOA: 03/10/2022  PCP: Renford Dills, MD Patient coming from: Home   I have personally briefly reviewed patient's old medical records in [facility]   Chief Complaint: Weakness, body aches,  cough, sick contact with COVID  HPI: [name] is a 88 y.o. female past medical history significant for dementia, diabetes, GERD, hypertension, CKD stage IV,  presents with generalized weakness, body ache since 4 days prior to admission.  Her daughter was diagnosed with COVID 4 days prior to admission.  Patient reports cough dry cough, and shortness of breath that is started 4 days prior to admission.  She denies chest pain, diarrhea, nausea or vomiting.  She has been eating okay.   Evaluation in the ED: Sodium 134, BUN 24, creatinine 1.9, hemoglobin 11, SARS coronavirus type II positive, UA with more than 50 white blood cell, BNP 1131, D-dimer 1.9,  Chest x-ray:Slightly increased interstitial/patchy opacities within the mid lungs bilaterally likely representing infection.  Review of Systems: All systems reviewed and apart from history of presenting illness, are negative.  Past Medical History:  Diagnosis Date   Dementia (HCC)    Diabetes mellitus    GERD (gastroesophageal reflux disease)    Glaucoma    Heart disease    Hypertension    Tinnitus    Past Surgical History:  Procedure Laterality Date   BREAST LUMPECTOMY     bil for breast ca   Social History:  reports that she has quit smoking. She has never used smokeless tobacco. She reports that she does not drink alcohol and does not use drugs.   Allergies  Allergen Reactions   Ace Inhibitors Swelling, Rash and Cough   Dilaudid [Hydromorphone Hcl] Itching and Other (See Comments)    Hallucinations (auditory and visual) also   Other Nausea And Vomiting    "Tussionex Pennkinetic ER"   Latex Rash   Tape Rash    Prefers PAPER TAPE, PLEASE!!    Family History   Problem Relation Age of Onset   Diabetes Sister     Prior to Admission medications   Medication Sig Start Date End Date Taking? Authorizing Provider  allopurinol (ZYLOPRIM) 100 MG tablet Take 100 mg by mouth 2 (two) times daily.    [provider]  amLODipine (NORVASC) 10 MG tablet Take 10 mg by mouth daily.    [provider]  augmented betamethasone dipropionate (DIPROLENE-AF) 0.05 % cream Apply 1 application topically daily as needed for dry skin. 10/06/20   [provider]  blood glucose meter kit and supplies KIT Dispense based on patient and insurance preference. Use up to four times daily as directed. (FOR ICD-9 250.00, 250.01). For QAC - HS accuchecks. 11/27/20   Leroy Sea, MD  Continuous Blood Gluc Sensor (FREESTYLE LIBRE 2 SENSOR) MISC USE AS DIRECTED. REAPPLY EVERY 14 DAYS 11/20/21   Romero Belling, MD  hydrALAZINE (APRESOLINE) 100 MG tablet Take 1 tablet (100 mg total) by mouth every 8 (eight) hours. 11/27/20   Leroy Sea, MD  insulin glargine (LANTUS SOLOSTAR) 100 UNIT/ML Solostar Pen Inject 5 Units into the skin daily. Take only if blood sugar is over 200 12/20/20   Romero Belling, MD  Insulin Syringe-Needle U-100 25G X 1" 1 ML MISC For 4 times a day insulin SQ, 1 month supply. Diagnosis E11.65 11/27/20   Leroy Sea, MD  levETIRAcetam (KEPPRA XR) 500 MG 24 hr tablet TAKE TWO TABLETS BY  MOUTH AT BEDTIME Patient taking differently: Take 500 mg by mouth daily. Morning    Levert Feinstein, MD  loratadine (CLARITIN) 10 MG tablet Take 10 mg by mouth daily as needed for allergies.    [provider]  metoprolol tartrate (LOPRESSOR) 100 MG tablet Take 100 mg by mouth 2 (two) times daily. 08/03/20   [provider]  mirtazapine (REMERON) 15 MG tablet Take 15 mg by mouth at bedtime.     [provider]  omeprazole (PRILOSEC) 20 MG capsule Take 20 mg by mouth daily.    [provider]  QUEtiapine (SEROQUEL) 25 MG tablet Take  25 mg by mouth at bedtime. 12/01/20   [provider]  repaglinide (PRANDIN) 0.5 MG tablet Take 1 tablet (0.5 mg total) by mouth 2 (two) times daily before a meal. 12/20/20   Romero Belling, MD  albuterol (PROVENTIL HFA;VENTOLIN HFA) 108 (90 BASE) MCG/ACT inhaler Inhale 2 puffs into the lungs every 2 (two) hours as needed for wheezing or shortness of breath (cough). Patient not taking: No sig reported 08/22/14 11/24/20  Arby Barrette, MD   Physical Exam: Vitals:   03/10/22 1315 03/10/22 1400 03/10/22 1543 03/10/22 1600  BP: (!) 167/63  (!) 166/73   Pulse: 60  69   Resp: 16  18   Temp:  99.5 F (37.5 C) 98.2 F (36.8 C)   TempSrc:   Oral   SpO2: 95%  98%   Weight:      Height:    5\' 2"  (1.575 m)    General exam: Moderately built and nourished patient, lying comfortably supine on the gurney in no obvious distress. Head, eyes and ENT: Nontraumatic and normocephalic. Pupils equally reacting to light and accommodation. Oral mucosa moist. Neck: Supple.  Positive JVD, carotid bruit or thyromegaly. Lymphatics: No lymphadenopathy. Respiratory system: Bilateral crackles, no increased work of breathing. Cardiovascular system: S1 and S2 heard, RRR. No JVD, murmurs, gallops, clicks or pedal edema. Gastrointestinal system: Abdomen is nondistended, soft and nontender. Normal bowel sounds heard. No organomegaly or masses appreciated. Central nervous system: Alert and oriented. No focal neurological deficits. Extremities: Symmetric 5 x 5 power. Peripheral pulses symmetrically felt.  Skin: No rashes or acute findings. Musculoskeletal system: Negative exam. Psychiatry: Pleasant and cooperative.   Labs on Admission:  Basic Metabolic Panel: Recent Labs  Lab 03/10/22 1146 03/10/22 1332  NA 134*  --   K 3.7  --   CL 99  --   CO2 26  --   GLUCOSE 112*  --   BUN 24*  --   CREATININE 1.97*  --   CALCIUM 9.2  --   MG  --  1.7  PHOS  --  4.0   Liver Function Tests: No results for  input(s): "AST", "ALT", "ALKPHOS", "BILITOT", "PROT", "ALBUMIN" in the last 168 hours. No results for input(s): "LIPASE", "AMYLASE" in the last 168 hours. No results for input(s): "AMMONIA" in the last 168 hours. CBC: Recent Labs  Lab 03/10/22 1146  WBC 5.7  NEUTROABS 4.3  HGB 11.1*  HCT 34.3*  MCV 88.9  PLT 243   Cardiac Enzymes: No results for input(s): "CKTOTAL", "CKMB", "CKMBINDEX", "TROPONINI" in the last 168 hours.  BNP (last 3 results) No results for input(s): "PROBNP" in the last 8760 hours. CBG: No results for input(s): "GLUCAP" in the last 168 hours.  Radiological Exams on Admission: DG Chest Port 1 View  Result Date: 03/10/2022 CLINICAL DATA:  Cough EXAM: PORTABLE CHEST 1 VIEW COMPARISON:  12/29/2020 and prior radiographs FINDINGS: Cardiomegaly identified. Slightly increased interstitial/patchy opacities within the mid lungs bilaterally noted likely representing infection. There is no evidence of pleural effusion or pneumothorax. No acute bony abnormalities are present. Severe glenohumeral joint degenerative changes again noted IMPRESSION: 1. Slightly increased interstitial/patchy opacities within the mid lungs bilaterally likely representing infection. 2. Cardiomegaly. Electronically Signed   By: Margarette Canada M.D.   On: 03/10/2022 12:41    EKG: Independently reviewed.   Assessment/Plan Principal Problem:   Pneumonia due to COVID-19 virus Active Problems:   UTI (urinary tract infection)   Dementia (HCC)   AKI (acute kidney injury) ([place])   Type 2 diabetes mellitus with complication, without long-term current use of insulin (HCC)   Glaucoma   CKD (chronic kidney disease), stage IV ([place])   1-Pneumonia secondary to COVID-19: Patient presented with shortness of breath, cough,  oxygen saturation 92% on room air, she was placed on 2 L of oxygen.  COVID PCR positive.  She has a sick contact daughter recently diagnosed with COVID as well. -Continue with IV ceftriaxone and  azithromycin to cover for superimposed pneumonia -Patient was a started on molnupiravir continue she will need 5 days course -Continue with oxygen supplementation and nebulizer -Continue with oral Decadron 6 mg daily for 10 days. -Start guaifenesin   2-AKI on CKD stage IV;  Prior creatinine per records 1.6 Lab work from PCP office GFR 29 cr 1.02 November 2021.  Creatinine on admission 1.9 Suspect component of cardiorenal syndrome. Monitor on IV Lasix.   3-Dementia, Depression;  Resume Mirtazapine, Seroquel.   Acute on chronic Diastolic Heart failure exacerbation/  Presents with dyspnea, elevated BNP, Positive JVD.  Plan to start IV lasix, will give 20 mg IV today.  She takes 40 mg oral lasix. She took it today per daughter.   Diabetes type 2; SSI. Hold oral meds.  Might need long acting if cbg increases.   History of seizure: We will resume Keppra  Mild hyponatremia: Monitor.   UTI; Pyuria;  UA with 50 WBC.  Follow urine culture.  Continue with ceftriaxone.   Elevated D dimer in setting Covid. DVT prophylaxis.  HTN; resume hydralazine and norvasc.   DVT Prophylaxis: Heparin  Code Status: Full code Family Communication: Daughter at bedside.  Disposition Plan: Admit for treatment of PNA, covid.   Time spent: 75 minutes  Elmarie Shiley MD [facility]   03/10/2022, 4:46 PM

## 2022-03-10 NOTE — Progress Notes (Signed)
Plan of Care Note for accepted transfer   Patient: [name] MRN: [id number]   DOA: 03/10/2022  Facility requesting transfer: [facility]. Requesting Provider: Aletta Edouard, MD Reason for transfer: COVID-19 pneumonia with new oxygen requirement. Facility course:  88 year old female with a past medical history of hypertension, GERD, history of UTIs, type 2 diabetes who presented to the emergency department complaints of progressively worse dyspnea, fatigue and malaise after being exposed to a family member with COVID earlier this week.  She also has an abnormal urine analysis, but no significant UTI symptoms.  I added inflammatory markers.  I started ceftriaxone, Zithromax, molnupiravir and dexamethasone.  Pneumonia and COVID-19 order sets in place.  She will need General adult admission order set once she arrives to the hospital.  Plan of care: The patient is accepted for admission to Progressive unit, at [facility]..   Author: Reubin Milan, MD 03/10/2022  Check [email or web address] for on-call coverage.  Nursing staff, Please call [place] number on Amion as soon as patient's arrival, so appropriate admitting provider can evaluate the pt.

## 2022-03-10 NOTE — ED Notes (Signed)
RT note: Pt./Family member stated that pt. has had dry non-productive cough prior to being seen today. Per order will start Albuterol Inhaler-2 Puffs Q6(1400hrs.)/Flutter/Incentive Spirometry along with obtaining Sputum sample, pt. remains on 2 lpm n/c which will be humidified while awaiting admission via [place].

## 2022-03-10 NOTE — ED Provider Notes (Signed)
[facility] EMERGENCY DEPT Provider Note   CSN: [id number] Arrival date & time: 03/10/22  1003     History  Chief Complaint  Patient presents with   Generalized Body Aches   Weakness    [name] is a 88 y.o. female.  She is brought in by her daughter for concerns for COVID.  Daughter tested positive for COVID a few days ago.  Patient has had a nonproductive cough decreased appetite and increased fatigue.  Symptoms started 3 days ago.  No vomiting or diarrhea.  No urinary symptoms.  The history is provided by the patient and a relative.  Weakness Severity:  Moderate Onset quality:  Gradual Duration:  3 days Timing:  Constant Progression:  Unchanged Chronicity:  New Relieved by:  Nothing Worsened by:  Activity Ineffective treatments:  Rest Associated symptoms: cough   Associated symptoms: no chest pain, no diarrhea, no dysuria, no falls, no fever, no foul-smelling urine and no syncope        Home Medications Prior to Admission medications   Medication Sig Start Date End Date Taking? Authorizing Provider  allopurinol (ZYLOPRIM) 100 MG tablet Take 100 mg by mouth 2 (two) times daily.    [provider]  amLODipine (NORVASC) 10 MG tablet Take 10 mg by mouth daily.    [provider]  augmented betamethasone dipropionate (DIPROLENE-AF) 0.05 % cream Apply 1 application topically daily as needed for dry skin. 10/06/20   [provider]  blood glucose meter kit and supplies KIT Dispense based on patient and insurance preference. Use up to four times daily as directed. (FOR ICD-9 250.00, 250.01). For QAC - HS accuchecks. 11/27/20   Thurnell Lose, MD  Continuous Blood Gluc Sensor (FREESTYLE LIBRE 2 SENSOR) MISC USE AS DIRECTED. REAPPLY EVERY 14 DAYS 11/20/21   Renato Shin, MD  hydrALAZINE (APRESOLINE) 100 MG tablet Take 1 tablet (100 mg total) by mouth every 8 (eight) hours. 11/27/20   Thurnell Lose, MD  insulin glargine (LANTUS  SOLOSTAR) 100 UNIT/ML Solostar Pen Inject 5 Units into the skin daily. Take only if blood sugar is over 200 12/20/20   Renato Shin, MD  Insulin Syringe-Needle U-100 25G X 1" 1 ML MISC For 4 times a day insulin SQ, 1 month supply. Diagnosis E11.65 11/27/20   Thurnell Lose, MD  levETIRAcetam (KEPPRA XR) 500 MG 24 hr tablet TAKE TWO TABLETS BY MOUTH AT BEDTIME Patient taking differently: Take 500 mg by mouth daily. Morning    Marcial Pacas, MD  loratadine (CLARITIN) 10 MG tablet Take 10 mg by mouth daily as needed for allergies.    [provider]  metoprolol tartrate (LOPRESSOR) 100 MG tablet Take 100 mg by mouth 2 (two) times daily. 08/03/20   [provider]  mirtazapine (REMERON) 15 MG tablet Take 15 mg by mouth at bedtime.     [provider]  omeprazole (PRILOSEC) 20 MG capsule Take 20 mg by mouth daily.    [provider]  QUEtiapine (SEROQUEL) 25 MG tablet Take 25 mg by mouth at bedtime. 12/01/20   [provider]  repaglinide (PRANDIN) 0.5 MG tablet Take 1 tablet (0.5 mg total) by mouth 2 (two) times daily before a meal. 12/20/20   Renato Shin, MD  albuterol (PROVENTIL HFA;VENTOLIN HFA) 108 (90 BASE) MCG/ACT inhaler Inhale 2 puffs into the lungs every 2 (two) hours as needed for wheezing or shortness of breath (cough). Patient not taking: No sig reported 08/22/14 11/24/20  Charlesetta Shanks,  MD      Allergies    Ace inhibitors, Dilaudid [hydromorphone hcl], Latex, and Tape    Review of Systems   Review of Systems  Constitutional:  Positive for fatigue. Negative for fever.  Respiratory:  Positive for cough.   Cardiovascular:  Negative for chest pain and syncope.  Gastrointestinal:  Negative for diarrhea.  Genitourinary:  Negative for dysuria.  Musculoskeletal:  Negative for falls.  Neurological:  Positive for weakness.    Physical Exam Updated Vital Signs BP 135/77   Pulse 65   Temp 99.3 F (37.4 C) (Oral)   Resp 16   Ht _0  (1.575 m)    Wt 73.5 kg   SpO2 94%   BMI 29.63 kg/m  Physical Exam Vitals and nursing note reviewed.  Constitutional:      General: She is not in acute distress.    Appearance: Normal appearance. She is well-developed.  HENT:     Head: Normocephalic and atraumatic.  Eyes:     Conjunctiva/sclera: Conjunctivae normal.  Cardiovascular:     Rate and Rhythm: Normal rate and regular rhythm.     Heart sounds: No murmur heard. Pulmonary:     Effort: Pulmonary effort is normal. No respiratory distress.     Breath sounds: Normal breath sounds.  Abdominal:     Palpations: Abdomen is soft.     Tenderness: There is no abdominal tenderness. There is no guarding or rebound.  Musculoskeletal:        General: Normal range of motion.     Cervical back: Neck supple.     Right lower leg: No edema.     Left lower leg: No edema.  Skin:    General: Skin is warm and dry.     Capillary Refill: Capillary refill takes less than 2 seconds.  Neurological:     General: No focal deficit present.     Mental Status: She is alert.     Sensory: No sensory deficit.     Motor: No weakness.     ED Results / Procedures / Treatments   Labs (all labs ordered are listed, but only abnormal results are displayed) Labs Reviewed  SARS CORONAVIRUS 2 BY RT PCR - Abnormal; Notable for the following components:      Result Value   SARS Coronavirus 2 by RT PCR POSITIVE (*)    All other components within normal limits  BASIC METABOLIC PANEL - Abnormal; Notable for the following components:   Sodium 134 (*)    Glucose, Bld 112 (*)    BUN 24 (*)    Creatinine, Ser 1.97 (*)    GFR, Estimated 24 (*)    All other components within normal limits  CBC WITH DIFFERENTIAL/PLATELET - Abnormal; Notable for the following components:   RBC 3.86 (*)    Hemoglobin 11.1 (*)    HCT 34.3 (*)    Lymphs Abs 0.6 (*)    All other components within normal limits  URINALYSIS, ROUTINE W REFLEX MICROSCOPIC - Abnormal; Notable for the following  components:   APPearance HAZY (*)    Protein, ur >300 (*)    Leukocytes,Ua MODERATE (*)    WBC, UA >50 (*)    Bacteria, UA MANY (*)    All other components within normal limits  D-DIMER, QUANTITATIVE - Abnormal; Notable for the following components:   D-Dimer, Quant 1.93 (*)    All other components within normal limits  BRAIN NATRIURETIC PEPTIDE - Abnormal; Notable for the following components:  B Natriuretic Peptide 1,131.3 (*)    All other components within normal limits  CREATININE, SERUM - Abnormal; Notable for the following components:   Creatinine, Ser 1.95 (*)    GFR, Estimated 24 (*)    All other components within normal limits  URINE CULTURE  CULTURE, BLOOD (ROUTINE X 2)  CULTURE, BLOOD (ROUTINE X 2)  EXPECTORATED SPUTUM ASSESSMENT W GRAM STAIN, RFLX TO RESP C  FERRITIN  MAGNESIUM  PHOSPHORUS  STREP PNEUMONIAE URINARY ANTIGEN  PROCALCITONIN  C-REACTIVE PROTEIN  HEMOGLOBIN A1C  MAGNESIUM  PHOSPHORUS  FERRITIN  D-DIMER, QUANTITATIVE  C-REACTIVE PROTEIN  COMPREHENSIVE METABOLIC PANEL  CBC WITH DIFFERENTIAL/PLATELET    EKG None  Radiology DG Chest Port 1 View  Result Date: 03/10/2022 CLINICAL DATA:  Cough EXAM: PORTABLE CHEST 1 VIEW COMPARISON:  12/29/2020 and prior radiographs FINDINGS: Cardiomegaly identified. Slightly increased interstitial/patchy opacities within the mid lungs bilaterally noted likely representing infection. There is no evidence of pleural effusion or pneumothorax. No acute bony abnormalities are present. Severe glenohumeral joint degenerative changes again noted IMPRESSION: 1. Slightly increased interstitial/patchy opacities within the mid lungs bilaterally likely representing infection. 2. Cardiomegaly. Electronically Signed   By: Margarette Canada M.D.   On: 03/10/2022 12:41    Procedures Procedures    Medications Ordered in ED Medications  cefTRIAXone (ROCEPHIN) 2 g in sodium chloride 0.9 % 100 mL IVPB (2 g Intravenous New Bag/Given 03/10/22  1422)  azithromycin (ZITHROMAX) 500 mg in sodium chloride 0.9 % 250 mL IVPB (500 mg Intravenous Not Given 03/10/22 1707)  molnupiravir EUA (LAGEVRIO) capsule 800 mg (800 mg Oral Not Given 03/10/22 1709)  dexamethasone (DECADRON) tablet 6 mg (6 mg Oral Given 03/10/22 1422)  albuterol (VENTOLIN HFA) 108 (90 Base) MCG/ACT inhaler 2 puff (2 puffs Inhalation Given 03/10/22 1433)  guaiFENesin-dextromethorphan (ROBITUSSIN DM) 100-10 MG/5ML syrup 10 mL (has no administration in time range)  ascorbic acid (VITAMIN C) tablet 500 mg (500 mg Oral Given 03/10/22 1422)  zinc sulfate capsule 220 mg (220 mg Oral Given 03/10/22 1422)  heparin injection 5,000 Units (has no administration in time range)  sodium chloride flush (NS) 0.9 % injection 3 mL (has no administration in time range)  sodium chloride flush (NS) 0.9 % injection 3 mL (has no administration in time range)  0.9 %  sodium chloride infusion (has no administration in time range)  acetaminophen (TYLENOL) tablet 650 mg (has no administration in time range)    Or  acetaminophen (TYLENOL) suppository 650 mg (has no administration in time range)  ondansetron (ZOFRAN) tablet 4 mg (has no administration in time range)    Or  ondansetron (ZOFRAN) injection 4 mg (has no administration in time range)  senna-docusate (Senokot-S) tablet 1 tablet (has no administration in time range)  guaiFENesin (MUCINEX) 12 hr tablet 600 mg (has no administration in time range)  hydrALAZINE (APRESOLINE) tablet 100 mg (has no administration in time range)  amLODipine (NORVASC) tablet 10 mg (has no administration in time range)  allopurinol (ZYLOPRIM) tablet 100 mg (has no administration in time range)  levETIRAcetam (KEPPRA XR) 24 hr tablet 500 mg (has no administration in time range)  mirtazapine (REMERON) tablet 15 mg (has no administration in time range)  QUEtiapine (SEROQUEL) tablet 25 mg (has no administration in time range)  metoprolol tartrate (LOPRESSOR) tablet 100 mg  (has no administration in time range)  furosemide (LASIX) injection 20 mg (has no administration in time range)  insulin aspart (novoLOG) injection 0-5 Units (has no administration in time range)  insulin aspart (novoLOG) injection 0-15 Units (has no administration in time range)  sodium chloride 0.9 % bolus 500 mL (500 mLs Intravenous New Bag/Given 03/10/22 1208)    ED Course/ Medical Decision Making/ A&P Clinical Course as of 03/10/22 1715  Sat Mar 10, 2022  1153 Patient's nurse says her oxygen level was low at 87% when she was brought back from the waiting room.  Daughter tells me she does not normally use oxygen but told the nurse that she has oxygen just does not use it. [MB]  4982 Chest x-ray interpreted by me as possible signs of pneumonia both right and left.  Awaiting radiology reading.  Lab work showing elevated creatinine although has been high before, low hemoglobin stable from priors, normal white count. [MB]  31 Gust with Dr. Olevia Bowens [facility].  He is going to put the patient in for inflammatory markers and cover with antibiotics for possible urinary infection possible pneumonia. [MB]    Clinical Course User Index [MB] Hayden Rasmussen, MD                           Medical Decision Making Amount and/or Complexity of Data Reviewed Labs: ordered. Radiology: ordered.  Risk Decision regarding hospitalization.  [name] was evaluated in Emergency Department on 03/10/2022 for the symptoms described in the history of present illness. She was evaluated in the context of the global COVID-19 pandemic, which necessitated consideration that the patient might be at risk for infection with the SARS-CoV-2 virus that causes COVID-19. Institutional protocols and algorithms that pertain to the evaluation of patients at risk for COVID-19 are in a state of rapid change based on information released by regulatory bodies including the CDC and federal and state organizations. These policies  and algorithms were followed during the patient's care in the ED.  This patient complains of cough and fatigue poor appetite; this involves an extensive number of treatment Options and is a complaint that carries with it a high risk of complications and morbidity. The differential includes COVID, flu, pneumonia, hypoxia, PE, pneumothorax, anemia  I ordered, reviewed and interpreted labs, which included CBC with normal white count, hemoglobin low slightly down from priors, chemistries with elevation BUN and creatinine, urinalysis possible infection sent for culture, inflammatory markers pending, COVID-positive I ordered medication antivirals steroids antibiotics and reviewed PMP when indicated. I ordered imaging studies which included chest x-ray and I independently    visualized and interpreted imaging which showed possible early pneumonia Additional history obtained from patient's daughter Previous records obtained and reviewed in epic no recent admissions I consulted Dr. Olevia Bowens [facility] and discussed lab and imaging findings and discussed disposition.  Cardiac monitoring reviewed, normal sinus rhythm Social determinants considered, no significant barriers Critical Interventions: None  after the interventions stated above, I reevaluated the patient and found patient to be comfortable on nasal cannula oxygen Admission and further testing considered, she would benefit from admission for further treatment of her hypoxia and infectious findings patient and daughter in agreement with plan.          Final Clinical Impression(s) / ED Diagnoses Final diagnoses:  Acute respiratory failure with hypoxia ([place])  Multifocal pneumonia  COVID-19 virus infection  Lower urinary tract infection    Rx / DC Orders ED Discharge Orders     None         Hayden Rasmussen, MD 03/10/22 1718

## 2022-03-10 NOTE — ED Notes (Signed)
Pt's 02 was at 27 when roomed, put pt on 2L of 02.

## 2022-03-11 ENCOUNTER — Inpatient Hospital Stay (HOSPITAL_COMMUNITY): Payer: PPO

## 2022-03-11 DIAGNOSIS — R0602 Shortness of breath: Secondary | ICD-10-CM | POA: Diagnosis not present

## 2022-03-11 DIAGNOSIS — J1282 Pneumonia due to coronavirus disease 2019: Secondary | ICD-10-CM | POA: Diagnosis not present

## 2022-03-11 DIAGNOSIS — U071 COVID-19: Secondary | ICD-10-CM | POA: Diagnosis not present

## 2022-03-11 LAB — CBC WITH DIFFERENTIAL/PLATELET
Abs Immature Granulocytes: 0.01 10*3/uL (ref 0.00–0.07)
Basophils Absolute: 0 10*3/uL (ref 0.0–0.1)
Basophils Relative: 0 %
Eosinophils Absolute: 0 10*3/uL (ref 0.0–0.5)
Eosinophils Relative: 0 %
HCT: 31.2 % — ABNORMAL LOW (ref 36.0–46.0)
Hemoglobin: 10 g/dL — ABNORMAL LOW (ref 12.0–15.0)
Immature Granulocytes: 0 %
Lymphocytes Relative: 15 %
Lymphs Abs: 0.5 10*3/uL — ABNORMAL LOW (ref 0.7–4.0)
MCH: 29 pg (ref 26.0–34.0)
MCHC: 32.1 g/dL (ref 30.0–36.0)
MCV: 90.4 fL (ref 80.0–100.0)
Monocytes Absolute: 0.1 10*3/uL (ref 0.1–1.0)
Monocytes Relative: 3 %
Neutro Abs: 2.6 10*3/uL (ref 1.7–7.7)
Neutrophils Relative %: 82 %
Platelets: 232 10*3/uL (ref 150–400)
RBC: 3.45 MIL/uL — ABNORMAL LOW (ref 3.87–5.11)
RDW: 15.2 % (ref 11.5–15.5)
WBC: 3.2 10*3/uL — ABNORMAL LOW (ref 4.0–10.5)
nRBC: 0 % (ref 0.0–0.2)

## 2022-03-11 LAB — COMPREHENSIVE METABOLIC PANEL
ALT: 18 U/L (ref 0–44)
AST: 21 U/L (ref 15–41)
Albumin: 3 g/dL — ABNORMAL LOW (ref 3.5–5.0)
Alkaline Phosphatase: 167 U/L — ABNORMAL HIGH (ref 38–126)
Anion gap: 9 (ref 5–15)
BUN: 30 mg/dL — ABNORMAL HIGH (ref 8–23)
CO2: 23 mmol/L (ref 22–32)
Calcium: 8.6 mg/dL — ABNORMAL LOW (ref 8.9–10.3)
Chloride: 107 mmol/L (ref 98–111)
Creatinine, Ser: 2.17 mg/dL — ABNORMAL HIGH (ref 0.44–1.00)
GFR, Estimated: 21 mL/min — ABNORMAL LOW (ref >60)
Glucose, Bld: 142 mg/dL — ABNORMAL HIGH (ref 70–99)
Potassium: 4.1 mmol/L (ref 3.5–5.1)
Sodium: 139 mmol/L (ref 135–145)
Total Bilirubin: 0.2 mg/dL — ABNORMAL LOW (ref 0.3–1.2)
Total Protein: 7.1 g/dL (ref 6.5–8.1)

## 2022-03-11 LAB — MAGNESIUM: Magnesium: 1.9 mg/dL (ref 1.7–2.4)

## 2022-03-11 LAB — PHOSPHORUS: Phosphorus: 5.2 mg/dL — ABNORMAL HIGH (ref 2.5–4.6)

## 2022-03-11 LAB — D-DIMER, QUANTITATIVE: D-Dimer, Quant: 1.65 ug/mL-FEU — ABNORMAL HIGH (ref 0.00–0.50)

## 2022-03-11 LAB — GLUCOSE, CAPILLARY
Glucose-Capillary: 128 mg/dL — ABNORMAL HIGH (ref 70–99)
Glucose-Capillary: 141 mg/dL — ABNORMAL HIGH (ref 70–99)
Glucose-Capillary: 168 mg/dL — ABNORMAL HIGH (ref 70–99)
Glucose-Capillary: 123 mg/dL — ABNORMAL HIGH (ref 70–99)

## 2022-03-11 LAB — ECHOCARDIOGRAM COMPLETE
Area-P 1/2: 3.34 cm2
Calc EF: 65.8 %
Height: 62 in
MV VTI: 2.41 cm2
S' Lateral: 2.5 cm
Single Plane A2C EF: 64.5 %
Single Plane A4C EF: 70.6 %
Weight: 2592 oz

## 2022-03-11 LAB — C-REACTIVE PROTEIN: CRP: 1.8 mg/dL — ABNORMAL HIGH (ref <1.0)

## 2022-03-11 LAB — FERRITIN: Ferritin: 70 ng/mL (ref 11–307)

## 2022-03-11 MED ORDER — SODIUM CHLORIDE 0.9 % IV BOLUS
250.0000 mL | Freq: Once | INTRAVENOUS | Status: AC
Start: 2022-03-11 — End: 2022-03-11
  Administered 2022-03-11: 250 mL via INTRAVENOUS

## 2022-03-11 MED ORDER — SODIUM CHLORIDE 0.9 % IV SOLN: INTRAVENOUS | Status: DC

## 2022-03-11 NOTE — Progress Notes (Signed)
  Echocardiogram 2D Echocardiogram has been performed.  [name] 03/11/2022, 3:48 PM

## 2022-03-11 NOTE — Progress Notes (Signed)
PROGRESS NOTE    [name]  OXB:[id number] DOB: Jun 12, 1929 DOA: 03/10/2022 PCP: Seward Carol, MD   Brief Narrative: 88 year old with past medical history significant for dementia, diabetes, GERD, hypertension, CKD stage IV, presents with generalized weakness body ache, cough and shortness of breath, with sick contact with COVID found to have Covid  Pneumonia, AKI on CKD stage IV and acute on chronic diastolic heart failure exacerbation.  He was a started on molnupiravir, IV ceftriaxone and azithromycin.  Assessment & Plan:   Principal Problem:   Pneumonia due to COVID-19 virus Active Problems:   UTI (urinary tract infection)   Dementia ([place])   AKI (acute kidney injury) ([place])   Type 2 diabetes mellitus with complication, without long-term current use of insulin ([facility])   Glaucoma   CKD (chronic kidney disease), stage IV ([place])  1-Pneumonia secondary to COVID-19, Acute Hypoxic Respiratory failure.  Patient presented with shortness of breath, cough,  oxygen saturation 92% on room air, she was placed on 2 L of oxygen.  COVID PCR positive.  She has a sick contact daughter recently diagnosed with COVID as well. -Continue with IV ceftriaxone and azithromycin to cover for superimposed pneumonia -Oxygen sat on Room Air at 85, stable on 2-3 L oxygen.  -Patient was a started on molnupiravir continue she will need 5 days course -Continue with oxygen supplementation and nebulizer -Continue with oral Decadron 6 mg daily for 10 days. -Started  guaifenesin     2-AKI on CKD stage IV;  Prior creatinine per records 1.6 Lab work from PCP office GFR 29 cr 1.02 November 2021.  Creatinine on admission 1.9--2.1 Suspect component of cardiorenal syndrome. Nephrology consulted, will defer diuretics to nephrology      3-Dementia, Depression;  Resume Mirtazapine, Seroquel.    Acute on chronic Diastolic Heart failure exacerbation/  Presents with dyspnea, elevated BNP, Positive JVD.  Received 20 mg IV  lasix yesterday.  She takes 40 mg oral lasix.  Nephrologist to assist with diuretics.   ECHO ordered.   Diabetes type 2; SSI. Hold oral meds.  Might need long acting if cbg increases.    History of seizure: Continue with Keppra   Mild hyponatremia: Monitor.    UTI; Pyuria;  UA with 50 WBC.  Follow urine culture. Growing 100,000 gram negative rods.  Continue with ceftriaxone.  Blood culture no growth to date.   Elevated D dimer in setting Covid. DVT prophylaxis. D dimer trending down.  HTN; Continue with hydralazine and norvasc.     Estimated body mass index is 29.63 kg/m as calculated from the following:   Height as of this encounter: '5\' 2"'$  (1.575 m).   Weight as of this encounter: 73.5 kg.   DVT prophylaxis: Heparin  Code Status: Full code Family Communication: Daughter over phone  Disposition Plan:  Status is: Inpatient Remains inpatient appropriate because: management of Covid PNA    Consultants:  Nephrology   Procedures:  ECHO  Antimicrobials:    Subjective: Patient alert, SOB on exertion per nurse report. Patient relates feeling ok, denies dyspnea, sporadic cough. Denies chest pain.   Objective: Vitals:   03/10/22 1600 03/10/22 2019 03/11/22 0024 03/11/22 0442  BP:  (!) 156/66 (!) 130/47 (!) 143/76  Pulse:  73 65 68  Resp:  '16 15 19  '$ Temp:  97.7 F (36.5 C) 98.6 F (37 C) 98.4 F (36.9 C)  TempSrc:  Oral Oral Oral  SpO2:  98% 100% 92%  Weight:      Height: 5'  2" (1.575 m)       Intake/Output Summary (Last 24 hours) at 03/11/2022 0713 Last data filed at 03/11/2022 0558 Gross per 24 hour  Intake 103.08 ml  Output --  Net 103.08 ml   Filed Weights   03/10/22 1025  Weight: 73.5 kg    Examination:  General exam: NAD Respiratory system: CTA Cardiovascular system: S 1, S 2 RRR Gastrointestinal system: BS present, soft, nt Central nervous system: non focal.  Extremities: no edema    Data Reviewed: I have personally reviewed following  labs and imaging studies  CBC: Recent Labs  Lab 03/10/22 1146 03/11/22 0519  WBC 5.7 3.2*  NEUTROABS 4.3 2.6  HGB 11.1* 10.0*  HCT 34.3* 31.2*  MCV 88.9 90.4  PLT 243 277   Basic Metabolic Panel: Recent Labs  Lab 03/10/22 1146 03/10/22 1332 03/10/22 1637 03/11/22 0519  NA 134*  --   --  139  K 3.7  --   --  4.1  CL 99  --   --  107  CO2 26  --   --  23  GLUCOSE 112*  --   --  142*  BUN 24*  --   --  30*  CREATININE 1.97*  --  1.95* 2.17*  CALCIUM 9.2  --   --  8.6*  MG  --  1.7  --  1.9  PHOS  --  4.0  --  5.2*   GFR: Estimated Creatinine Clearance: 15.9 mL/min (A) (by C-G formula based on SCr of 2.17 mg/dL (H)). Liver Function Tests: Recent Labs  Lab 03/11/22 0519  AST 21  ALT 18  ALKPHOS 167*  BILITOT 0.2*  PROT 7.1  ALBUMIN 3.0*   No results for input(s): "LIPASE", "AMYLASE" in the last 168 hours. No results for input(s): "AMMONIA" in the last 168 hours. Coagulation Profile: No results for input(s): "INR", "PROTIME" in the last 168 hours. Cardiac Enzymes: No results for input(s): "CKTOTAL", "CKMB", "CKMBINDEX", "TROPONINI" in the last 168 hours. BNP (last 3 results) No results for input(s): "PROBNP" in the last 8760 hours. HbA1C: Recent Labs    03/10/22 1652  HGBA1C 6.4*   CBG: Recent Labs  Lab 03/10/22 1836 03/10/22 2016  GLUCAP 159* 131*   Lipid Profile: No results for input(s): "CHOL", "HDL", "LDLCALC", "TRIG", "CHOLHDL", "LDLDIRECT" in the last 72 hours. Thyroid Function Tests: No results for input(s): "TSH", "T4TOTAL", "FREET4", "T3FREE", "THYROIDAB" in the last 72 hours. Anemia Panel: Recent Labs    03/10/22 1332 03/11/22 0519  FERRITIN 80 70   Sepsis Labs: Recent Labs  Lab 03/10/22 1332  PROCALCITON <0.10    Recent Results (from the past 240 hour(s))  SARS Coronavirus 2 by RT PCR (hospital order, performed in [facility] hospital lab) *cepheid single result test* Anterior Nasal Swab     Status: Abnormal   Collection Time:  03/10/22 10:28 AM   Specimen: Anterior Nasal Swab  Result Value Ref Range Status   SARS Coronavirus 2 by RT PCR POSITIVE (A) NEGATIVE Final    Comment: (NOTE) SARS-CoV-2 target nucleic acids are DETECTED  SARS-CoV-2 RNA is generally detectable in upper respiratory specimens  during the acute phase of infection.  Positive results are indicative  of the presence of the identified virus, but do not rule out bacterial infection or co-infection with other pathogens not detected by the test.  Clinical correlation with patient history and  other diagnostic information is necessary to determine patient infection status.  The expected result is negative.  Fact Sheet for Patients:   https://www.patel.info/   Fact Sheet for Healthcare Providers:   https://hall.com/    This test is not yet approved or cleared by the [place] FDA and  has been authorized for detection and/or diagnosis of SARS-CoV-2 by FDA under an Emergency Use Authorization (EUA).  This EUA will remain in effect (meaning this test can be used) for the duration of  the COVID-19 declaration under Section 564(b)(1)  of the Act, 21 U.S.C. section 360-bbb-3(b)(1), unless the authorization is terminated or revoked sooner.   Performed at [facility], [address] [place], [place] [address]          Radiology Studies: DG Chest [facility] 1 View  Result Date: 03/10/2022 CLINICAL DATA:  Cough EXAM: PORTABLE CHEST 1 VIEW COMPARISON:  12/29/2020 and prior radiographs FINDINGS: Cardiomegaly identified. Slightly increased interstitial/patchy opacities within the mid lungs bilaterally noted likely representing infection. There is no evidence of pleural effusion or pneumothorax. No acute bony abnormalities are present. Severe glenohumeral joint degenerative changes again noted IMPRESSION: 1. Slightly increased interstitial/patchy opacities within the mid lungs bilaterally  likely representing infection. 2. Cardiomegaly. Electronically Signed   By: Margarette Canada M.D.   On: 03/10/2022 12:41        Scheduled Meds:  albuterol  2 puff Inhalation BID   allopurinol  100 mg Oral BID   amLODipine  10 mg Oral Daily   vitamin C  500 mg Oral Daily   dexamethasone  6 mg Oral Daily   furosemide  20 mg Intravenous Daily   guaiFENesin  600 mg Oral BID   heparin  5,000 Units Subcutaneous Q8H   hydrALAZINE  100 mg Oral Q8H   insulin aspart  0-15 Units Subcutaneous TID WC   insulin aspart  0-5 Units Subcutaneous QHS   levETIRAcetam  500 mg Oral Daily   metoprolol tartrate  100 mg Oral BID   mirtazapine  15 mg Oral QHS   molnupiravir EUA  4 capsule Oral BID   QUEtiapine  25 mg Oral QHS   sodium chloride flush  3 mL Intravenous Q12H   zinc sulfate  220 mg Oral Daily   Continuous Infusions:  sodium chloride     azithromycin     cefTRIAXone (ROCEPHIN)  IV 2 g (03/10/22 1422)     LOS: 1 day    Time spent: 35 minutes.     Elmarie Shiley, MD [facility]   If 7PM-7AM, please contact night-coverage [email or web address]  03/11/2022, 7:13 AM

## 2022-03-11 NOTE — Consult Note (Addendum)
Renal Service Consult Note [facility]  KIRANDEEP FARISS 03/11/2022 Sol Blazing, MD Requesting Physician: Dr. Tyrell Antonio  Reason for Consult: AKI HPI: The patient is a 88 y.o. year-old w/ hx of dementia, DM2, GERD, glaucoma, HTN and CKD who presented to ED yesterday c/o gen'd weakness, body aches, somnolence. Symptoms started 4 days ago on Wed. Her daughter tested + for COVID on Thursday. No n/v/d, no fevers. In ED creat 1.9, Hb 11, BUN 24, Na 134.  Covid was +. UA showed > 50 wbcs. CXR showed patchy opacities within the mid lungs bilaterally likely infection. Pt was admitted for COVID pna and started on 2L O2, IV rocephin and azithromycin for possible bact pna. Started on molnupiravir x 5 d course. Also getting po decadron x 10 days. Creat today was up a bit to 2.17.  We are asked to see for renal failure.   Pt seen in room. She denies taking any medication at home. She says she lives with her daughter. No cough, SOB or fevers at this time.   ROS - denies CP, no joint pain, no HA, no blurry vision, no rash, no diarrhea, no nausea/ vomiting, no dysuria, no difficulty voiding   Past Medical History  Past Medical History:  Diagnosis Date   Dementia ([place])    Diabetes mellitus    GERD (gastroesophageal reflux disease)    Glaucoma    Heart disease    Hypertension    Tinnitus    Past Surgical History  Past Surgical History:  Procedure Laterality Date   BREAST LUMPECTOMY     bil for breast ca   Family History  Family History  Problem Relation Age of Onset   Diabetes Sister    Social History  reports that she has quit smoking. She has never used smokeless tobacco. She reports that she does not drink alcohol and does not use drugs. Allergies  Allergies  Allergen Reactions   Ace Inhibitors Swelling, Rash and Cough   Prandin [Repaglinide] Other (See Comments)    Caused significant peripheral edema   Dilaudid [Hydromorphone Hcl] Itching and Other (See Comments)     Hallucinations (auditory and visual) also   Other Nausea And Vomiting    "Tussionex Pennkinetic ER"   Latex Rash   Tape Rash    Prefers PAPER TAPE, PLEASE!!   Home medications Prior to Admission medications   Medication Sig Start Date End Date Taking? Authorizing Provider  allopurinol (ZYLOPRIM) 100 MG tablet Take 100 mg by mouth 2 (two) times daily.   Yes [provider]  amLODipine (NORVASC) 10 MG tablet Take 10 mg by mouth daily.   Yes [provider]  augmented betamethasone dipropionate (DIPROLENE-AF) 0.05 % cream Apply 1 application  topically daily as needed for dry skin (affected areas). 10/06/20  Yes [provider]  Continuous Blood Gluc Sensor (FREESTYLE LIBRE 2 SENSOR) MISC USE AS DIRECTED. REAPPLY EVERY 14 DAYS Patient taking differently: Inject 1 Device into the skin every 14 (fourteen) days. 11/20/21  Yes Renato Shin, MD  furosemide (LASIX) 40 MG tablet Take 40 mg by mouth in the morning.   Yes [provider]  gabapentin (NEURONTIN) 100 MG capsule Take 100 mg by mouth 2 (two) times daily.   Yes [provider]  hydrALAZINE (APRESOLINE) 100 MG tablet Take 1 tablet (100 mg total) by mouth every 8 (eight) hours. Patient taking differently: Take 100 mg by mouth 3 (three) times daily. 11/27/20  Yes Thurnell Lose, MD  levETIRAcetam (KEPPRA) 500 MG tablet Take 500 mg by mouth in the morning.   Yes [provider]  loratadine (CLARITIN) 10 MG tablet Take 10 mg by mouth daily as needed for allergies.   Yes [provider]  metoprolol tartrate (LOPRESSOR) 100 MG tablet Take 100 mg by mouth 2 (two) times daily. 08/03/20  Yes [provider]  mirtazapine (REMERON) 15 MG tablet Take 15 mg by mouth at bedtime.    Yes [provider]  Multiple Vitamins-Minerals (CENTRUM SILVER 50+WOMEN) TABS Take 1 tablet by mouth daily with breakfast.   Yes [provider]  NOVOLOG FLEXPEN 100 UNIT/ML FlexPen Inject  into the skin See admin instructions. Inject as directed into the skin three times a day with meals, per sliding scale and ONLY if the BGL is 200 or greater   Yes [provider]  omeprazole (PRILOSEC) 20 MG capsule Take 20 mg by mouth every Monday, Wednesday, and Friday.   Yes [provider]  QUEtiapine (SEROQUEL) 25 MG tablet Take 25 mg by mouth at bedtime. 12/01/20  Yes [provider]  blood glucose meter kit and supplies KIT Dispense based on patient and insurance preference. Use up to four times daily as directed. (FOR ICD-9 250.00, 250.01). For QAC - HS accuchecks. 11/27/20   Thurnell Lose, MD  insulin glargine (LANTUS SOLOSTAR) 100 UNIT/ML Solostar Pen Inject 5 Units into the skin daily. Take only if blood sugar is over 200 Patient not taking: Reported on 03/10/2022 12/20/20   Renato Shin, MD  Insulin Syringe-Needle U-100 25G X 1" 1 ML MISC For 4 times a day insulin SQ, 1 month supply. Diagnosis E11.65 11/27/20   Thurnell Lose, MD  levETIRAcetam (KEPPRA XR) 500 MG 24 hr tablet TAKE TWO TABLETS BY MOUTH AT BEDTIME Patient not taking: Reported on 03/10/2022    Marcial Pacas, MD  repaglinide (PRANDIN) 0.5 MG tablet Take 1 tablet (0.5 mg total) by mouth 2 (two) times daily before a meal. Patient not taking: Reported on 03/10/2022 12/20/20   Renato Shin, MD  albuterol (PROVENTIL HFA;VENTOLIN HFA) 108 (90 BASE) MCG/ACT inhaler Inhale 2 puffs into the lungs every 2 (two) hours as needed for wheezing or shortness of breath (cough). Patient not taking: No sig reported 08/22/14 11/24/20  Charlesetta Shanks, MD     Vitals:   03/11/22 0811 03/11/22 1307 03/11/22 1310 03/11/22 1345  BP: (!) 147/52   (!) 157/62  Pulse: 65   67  Resp:    18  Temp:    97.9 F (36.6 C)  TempSrc:    Oral  SpO2: 97% (!) 84% 95% 93%  Weight:      Height:       Exam Gen alert, no distress, elderly pleasant lady sitting on side of bed No rash, cyanosis or gangrene Sclera anicteric, throat clear   No jvd or bruits Chest clear bilat to bases, no rales/ wheezing RRR no MRG Abd soft ntnd no mass or ascites +bs GU defer MS no joint effusions or deformity Ext no pitting LE or UE edema, no wounds or ulcers Neuro is alert, nonfocal, interacts appropriately    Inpatient meds: IV rocephin, azithromax, NS 500 bolus, po norvasc, decadron, allopurinol, lasix IV 20 mg yest x 1, sq hep tid, po hydralazine 100 tid, insulin SSI, keppra 500 qd, metoprolol 100 bid, mirtazapine, mulnupiravir, quetiapine, zinc   Home meds include - allopurinol, amlodipine 10, hydralazine 100 tid, metoprolol 100 bid, mirtazapine, quetiapine, levetiracetam, prns/ supps/ vits  CXR 8/12 - IMPRESSION: 1. Slightly increased interstitial/patchy opacities within the mid lungs bilaterally likely representing infection. 2. Cardiomegaly.         Date   Creat  eGFR    2010   0.8- 1.0 AKI 1.98 >> 1.09    2016- 2018  1.28- 1.57 33- 44 ml/min     April 2022   1.39- 1.81 26- 36 ml/min, AKI peak 2.24    03/10/22   1.97  24      03/11/22    2.17  21 ml/min     Na 139  K 4.1  CO2 23  BUN 30  Creat 2.17  phos 5.2  Alb 3.0  LFT's okay     WBC 3k Hb 10     Echo 8/13 - LVEF 60-65%, mod LVH, +RV overload, G2DD, severe pHTN (est 78mHg).    UA 8/12 - prot >300, many bact, 6-10 rbc, 0-5 epis, >50 wbc     SBP 140-170 on admission, stable SBP > 130 since admit       HR 70s  RR 16- 24  +fever 99.4     Assessment/ Plan: AKI on CKD 3b - b/l creatinine 1.4- 1.8 from April 2022, eGFR 26- 36 ml/min. Creat here 1.9 on admission and up to 2.2 today. Has received BP lowering meds here and IV lasix x 1. No hypotension, contrast, nsaids or IV vanc. UA +pyuria. CXR w/ no edema and exam w/o vol overload. Suspect AKI (if she has it) due to dehydration, vs progression of CKD. Hemodynamics are stable. Get urine lytes, renal [place] Will give small bolus and start NS 0.9% at 85 cc/hr overnight, reassess labs tomorrow. Will follow.  COVID infection - is on 2L  [place] O2, started in ED. Pt does not appear toxic. CXR appears normal.  Dementia  Gout HTN - getting metoprolol and hydralazine here, continue, BP's good.  Anemia - Hb 10, per pmd Leukopenia       Rob Nil Bolser  MD 03/11/2022, 7:05 PM Recent Labs  Lab 03/10/22 1146 03/10/22 1332 03/10/22 1637 03/11/22 0519  HGB 11.1*  --   --  10.0*  ALBUMIN  --   --   --  3.0*  CALCIUM 9.2  --   --  8.6*  PHOS  --  4.0  --  5.2*  CREATININE 1.97*  --  1.95* 2.17*  K 3.7  --   --  4.1

## 2022-03-11 NOTE — Evaluation (Signed)
Physical Therapy Evaluation Patient Details Name: [name] MRN: [id number] DOB: March 02, 1929 Today's Date: 03/11/2022  History of Present Illness  88 yo female admitted with COVID Pna. Hx of dementia, DM, CKD  Clinical Impression  On eval, pt was Min guard A for mobility (for the most part). She walked ~20 feet around the room without a device. Remained on [place] O2 during session-O2 sat level 95% on 3L (did not remove [place] O2 since pt had O2 desat earlier with RN). No family present during session. Will plan to follow during this hospital stay. Recommend HHPT f/u if pt/family are agreeable.       Recommendations for follow up therapy are one component of a multi-disciplinary discharge planning process, led by the attending physician.  Recommendations may be updated based on patient status, additional functional criteria and insurance authorization.  Follow Up Recommendations Home health PT      Assistance Recommended at Discharge PRN  Patient can return home with the following  A little help with walking and/or transfers;Assist for transportation;Assistance with cooking/housework;Help with stairs or ramp for entrance    Equipment Recommendations None recommended by PT  Recommendations for Other Services       Functional Status Assessment Patient has had a recent decline in their functional status and demonstrates the ability to make significant improvements in function in a reasonable and predictable amount of time.     Precautions / Restrictions Precautions Precaution Comments: monitor O2 Restrictions Weight Bearing Restrictions: No      Mobility  Bed Mobility Overal bed mobility: Needs Assistance Bed Mobility: Sit to Supine       Sit to supine: Min assist   General bed mobility comments: pt sitting EOB upon my arrival (alone in room). Small amount of assist for LEs back onto bed.    Transfers Overall transfer level: Needs assistance Equipment used: None Transfers: Sit  to/from Stand Sit to Stand: Min guard           General transfer comment: Increased time.    Ambulation/Gait Ambulation/Gait assistance: Min guard Gait Distance (Feet): 20 Feet Assistive device: None Gait Pattern/deviations: Step-through pattern, Decreased stride length       General Gait Details: Slow gait speed. dyspnea at least 2/4. O2 95% on 3L (did not remove since RN walked her earlier on RA with drop in O2 sats). Fatigues fairly easily.  Stairs            Wheelchair Mobility    Modified Rankin (Stroke Patients Only)       Balance Overall balance assessment: Mild deficits observed, not formally tested                                           Pertinent Vitals/Pain Pain Assessment Pain Assessment: Faces Faces Pain Scale: Hurts a little bit Pain Location: feet Pain Descriptors / Indicators: Sore Pain Intervention(s): Limited activity within patient's tolerance, Monitored during session, Repositioned    Home Living Family/patient expects to be discharged to:: Private residence Living Arrangements: Children Available Help at Discharge: Family Type of Home: House                  Prior Function Prior Level of Function : Needs assist             Mobility Comments: pt reports she doesn't use a walker or cane  Hand Dominance        Extremity/Trunk Assessment   Upper Extremity Assessment Upper Extremity Assessment: Overall WFL for tasks assessed    Lower Extremity Assessment Lower Extremity Assessment: Generalized weakness    Cervical / Trunk Assessment Cervical / Trunk Assessment: Normal  Communication   Communication: HOH  Cognition Arousal/Alertness: Awake/alert Behavior During Therapy: WFL for tasks assessed/performed Overall Cognitive Status: History of cognitive impairments - at baseline                                 General Comments: follows commands well. pleasant.         General Comments      Exercises     Assessment/Plan    PT Assessment Patient needs continued PT services  PT Problem List Decreased strength;Decreased mobility;Decreased activity tolerance;Decreased balance       PT Treatment Interventions DME instruction;Therapeutic activities;Gait training;Therapeutic exercise;Patient/family education;Balance training;Functional mobility training    PT Goals (Current goals can be found in the Care Plan section)  Acute Rehab PT Goals Patient Stated Goal: none stated. no family present at time of eval PT Goal Formulation: With patient Time For Goal Achievement: 03/25/22 Potential to Achieve Goals: Good    Frequency Min 3X/week     Co-evaluation               AM-PAC PT "6 Clicks" Mobility  Outcome Measure Help needed turning from your back to your side while in a flat bed without using bedrails?: None Help needed moving from lying on your back to sitting on the side of a flat bed without using bedrails?: A Little Help needed moving to and from a bed to a chair (including a wheelchair)?: None Help needed standing up from a chair using your arms (e.g., wheelchair or bedside chair)?: None Help needed to walk in hospital room?: A Little Help needed climbing 3-5 steps with a railing? : A Little 6 Click Score: 21    End of Session Equipment Utilized During Treatment: Oxygen Activity Tolerance: Patient limited by fatigue Patient left: in bed;with call bell/phone within reach;with bed alarm set   PT Visit Diagnosis: Difficulty in walking, not elsewhere classified (R26.2)    Time: 1450-1501 PT Time Calculation (min) (ACUTE ONLY): 11 min   Charges:   PT Evaluation $PT Eval Low Complexity: 1 Low            Doreatha Massed, PT Acute Rehabilitation  Office: [phone] Pager: [phone]

## 2022-03-11 NOTE — Progress Notes (Signed)
   03/11/22 1307  Oxygen Therapy  SpO2 (!) 84 %  O2 Device Room Air  Patient Activity (if Appropriate) Ambulating   Patient became hypoxic on ambulation to restroom on room air. Patient was returned to bed safely, placed on 3L nasal cannula, O2 sats remain 94-97%. MD was notified

## 2022-03-12 DIAGNOSIS — J1282 Pneumonia due to coronavirus disease 2019: Secondary | ICD-10-CM | POA: Diagnosis not present

## 2022-03-12 DIAGNOSIS — U071 COVID-19: Secondary | ICD-10-CM | POA: Diagnosis not present

## 2022-03-12 LAB — COMPREHENSIVE METABOLIC PANEL
ALT: 16 U/L (ref 0–44)
AST: 20 U/L (ref 15–41)
Albumin: 3.1 g/dL — ABNORMAL LOW (ref 3.5–5.0)
Alkaline Phosphatase: 157 U/L — ABNORMAL HIGH (ref 38–126)
Anion gap: 8 (ref 5–15)
BUN: 39 mg/dL — ABNORMAL HIGH (ref 8–23)
CO2: 23 mmol/L (ref 22–32)
Calcium: 8.3 mg/dL — ABNORMAL LOW (ref 8.9–10.3)
Chloride: 107 mmol/L (ref 98–111)
Creatinine, Ser: 2.03 mg/dL — ABNORMAL HIGH (ref 0.44–1.00)
GFR, Estimated: 23 mL/min — ABNORMAL LOW (ref >60)
Glucose, Bld: 124 mg/dL — ABNORMAL HIGH (ref 70–99)
Potassium: 3.6 mmol/L (ref 3.5–5.1)
Sodium: 138 mmol/L (ref 135–145)
Total Bilirubin: 0.2 mg/dL — ABNORMAL LOW (ref 0.3–1.2)
Total Protein: 7.2 g/dL (ref 6.5–8.1)

## 2022-03-12 LAB — CBC WITH DIFFERENTIAL/PLATELET
Abs Immature Granulocytes: 0.02 10*3/uL (ref 0.00–0.07)
Basophils Absolute: 0 10*3/uL (ref 0.0–0.1)
Basophils Relative: 0 %
Eosinophils Absolute: 0 10*3/uL (ref 0.0–0.5)
Eosinophils Relative: 0 %
HCT: 31.3 % — ABNORMAL LOW (ref 36.0–46.0)
Hemoglobin: 10 g/dL — ABNORMAL LOW (ref 12.0–15.0)
Immature Granulocytes: 1 %
Lymphocytes Relative: 14 %
Lymphs Abs: 0.6 10*3/uL — ABNORMAL LOW (ref 0.7–4.0)
MCH: 29.3 pg (ref 26.0–34.0)
MCHC: 31.9 g/dL (ref 30.0–36.0)
MCV: 91.8 fL (ref 80.0–100.0)
Monocytes Absolute: 0.3 10*3/uL (ref 0.1–1.0)
Monocytes Relative: 8 %
Neutro Abs: 3.2 10*3/uL (ref 1.7–7.7)
Neutrophils Relative %: 77 %
Platelets: 231 10*3/uL (ref 150–400)
RBC: 3.41 MIL/uL — ABNORMAL LOW (ref 3.87–5.11)
RDW: 15.1 % (ref 11.5–15.5)
WBC: 4.1 10*3/uL (ref 4.0–10.5)
nRBC: 0 % (ref 0.0–0.2)

## 2022-03-12 LAB — GLUCOSE, CAPILLARY
Glucose-Capillary: 108 mg/dL — ABNORMAL HIGH (ref 70–99)
Glucose-Capillary: 117 mg/dL — ABNORMAL HIGH (ref 70–99)
Glucose-Capillary: 150 mg/dL — ABNORMAL HIGH (ref 70–99)
Glucose-Capillary: 188 mg/dL — ABNORMAL HIGH (ref 70–99)

## 2022-03-12 LAB — URINE CULTURE: Culture: >=100000 — AB

## 2022-03-12 LAB — D-DIMER, QUANTITATIVE: D-Dimer, Quant: 1 ug/mL-FEU — ABNORMAL HIGH (ref 0.00–0.50)

## 2022-03-12 LAB — MAGNESIUM: Magnesium: 1.9 mg/dL (ref 1.7–2.4)

## 2022-03-12 LAB — FERRITIN: Ferritin: 69 ng/mL (ref 11–307)

## 2022-03-12 LAB — C-REACTIVE PROTEIN: CRP: 0.8 mg/dL (ref <1.0)

## 2022-03-12 LAB — PHOSPHORUS: Phosphorus: 4.8 mg/dL — ABNORMAL HIGH (ref 2.5–4.6)

## 2022-03-12 NOTE — Evaluation (Signed)
Occupational Therapy Evaluation Patient Details Name: [name] MRN: [id number] DOB: May 20, 1929 Today's Date: 03/12/2022   History of Present Illness Patient is a 88 year old female who was admitted with COIVD 19 pneumonia. PMH: dementia, Dm, CKD   Clinical Impression   Patient evaluated by Occupational Therapy with no further acute OT needs identified. All education has been completed and the patient has no further questions. Patient was supervision for ADLs on this date for line management. Patient reported she is able to do everything for herself and does not need OT. See below for any follow-up Occupational Therapy or equipment needs. OT is signing off. Thank you for this referral.       Recommendations for follow up therapy are one component of a multi-disciplinary discharge planning process, led by the attending physician.  Recommendations may be updated based on patient status, additional functional criteria and insurance authorization.   Follow Up Recommendations  No OT follow up    Assistance Recommended at Discharge Frequent or constant Supervision/Assistance  Patient can return home with the following A little help with walking and/or transfers;A little help with bathing/dressing/bathroom;Assistance with cooking/housework;Direct supervision/assist for financial management;Assist for transportation;Help with stairs or ramp for entrance;Direct supervision/assist for medications management    Functional Status Assessment  Patient has had a recent decline in their functional status and demonstrates the ability to make significant improvements in function in a reasonable and predictable amount of time.  Equipment Recommendations  None recommended by OT    Recommendations for Other Services       Precautions / Restrictions Precautions Precautions: Fall Precaution Comments: monitor O2 Restrictions Weight Bearing Restrictions: No      Mobility Bed Mobility Overal bed  mobility: Needs Assistance Bed Mobility: Supine to Sit     Supine to sit: Supervision, HOB elevated Sit to supine: Supervision        Transfers                          Balance Overall balance assessment: Mild deficits observed, not formally tested                                         ADL either performed or assessed with clinical judgement   ADL Overall ADL's : At baseline                                       General ADL Comments: patient is able to complete ADL tasks with supervision for line management. patient reported having O2 at home at baseline. patient was able to bring BLE to lap but declined to doff socks at thsi time.     Vision Patient Visual Report: No change from baseline       Perception     Praxis      Pertinent Vitals/Pain Pain Assessment Pain Assessment: No/denies pain     Hand Dominance Right   Extremity/Trunk Assessment Upper Extremity Assessment Upper Extremity Assessment: Overall WFL for tasks assessed   Lower Extremity Assessment Lower Extremity Assessment: Defer to PT evaluation   Cervical / Trunk Assessment Cervical / Trunk Assessment: Normal   Communication Communication Communication: HOH   Cognition Arousal/Alertness: Awake/alert Behavior During Therapy: WFL for tasks assessed/performed Overall Cognitive Status: History of cognitive impairments -  at baseline                                 General Comments: patient follows commands HOH     General Comments       Exercises     Shoulder Instructions      Home Living Family/patient expects to be discharged to:: Private residence Living Arrangements: Children Available Help at Discharge: Family Type of Home: House                                  Prior Functioning/Environment Prior Level of Function : Needs assist             Mobility Comments: pt reports she doesn't use a walker or  cane ADLs Comments: reports being able to do everything for herself        OT Problem List: Decreased safety awareness;Cardiopulmonary status limiting activity      OT Treatment/Interventions:      OT Goals(Current goals can be found in the care plan section) Acute Rehab OT Goals OT Goal Formulation: All assessment and education complete, DC therapy  OT Frequency:      Co-evaluation              AM-PAC OT "6 Clicks" Daily Activity     Outcome Measure Help from another person eating meals?: None Help from another person taking care of personal grooming?: None Help from another person toileting, which includes using toliet, bedpan, or urinal?: A Little Help from another person bathing (including washing, rinsing, drying)?: A Little Help from another person to put on and taking off regular upper body clothing?: A Little Help from another person to put on and taking off regular lower body clothing?: A Little 6 Click Score: 20   End of Session Equipment Utilized During Treatment: Oxygen  Activity Tolerance: Patient tolerated treatment well Patient left: in bed;with call bell/phone within reach  OT Visit Diagnosis: Unsteadiness on feet (R26.81);Other abnormalities of gait and mobility (R26.89)                Time: 0626-9485 OT Time Calculation (min): 14 min Charges:  OT General Charges $OT Visit: 1 Visit OT Evaluation $OT Eval Low Complexity: 1 Low  Jackelyn Poling OTR/L, MS Acute Rehabilitation Department Office# [phone] Pager# [phone]   Marcellina Millin 03/12/2022, 2:08 PM

## 2022-03-12 NOTE — Progress Notes (Signed)
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 Will stop IVF. She does have good appetite and is hydrating adequately--overall euvolemic on exam. AKI likely related to dehydration (hyaline casts, COVID, UTI) +/- progression of CKD. Not suspecting CRS as a primary cause given her clinical picture. Ultrasound without obstruction -Avoid nephrotoxic medications including NSAIDs and iodinated intravenous contrast exposure unless the latter is absolutely indicated.  Preferred narcotic agents for pain control are hydromorphone, fentanyl, and methadone. Morphine should not be used. Avoid Baclofen and avoid oral sodium phosphate and magnesium citrate based laxatives / bowel preps. Continue strict Input and Output monitoring. Will monitor the patient closely with you and intervene or adjust therapy as indicated by changes in clinical status/labs   AHRF secondary COVID-19 PNA -per primary service  H/o Dementia -per primary service  HTN -resume home meds, BP acceptable. Can utilize amlodipine if needed  Normocytic anemia -Hgb currently acceptable for CKD (10)  Acute on chronic dCHF -echo 8/13 consistent with some right heart vol overload, will stop IVF. Hold off on giving lasix for now but can resume this on discharge (provided she does not need any doses in the interim)  UTI, pyuria -on rocephin, per primary  Subjective:   S/p 250cc NS bolus yesterday followed by 85cc/hr Patient seen and examined in room. Reports that her SOB is slightly better. Daughter at bedside and she does report that patient has been eating and drinking very well here. No other complaints.   Objective:   BP (!) 160/59 (BP Location: Left Arm)   Pulse 62   Temp 97.8 F (36.6 C) (Oral)   Resp 19   Ht '5\' 2"'$  (1.575 m)   Wt 73.5 kg   SpO2 98%   BMI 29.63 kg/m   Intake/Output Summary (Last 24 hours) at 03/12/2022 1212 Last  data filed at 03/12/2022 0300 Gross per 24 hour  Intake 358.38 ml  Output --  Net 358.38 ml   Weight change:   Physical Exam: Gen:NAD, nontoxic CVS:RRR Resp:normal WOB EYC:XKGY, nt/nd Ext:no sig edema Neuro: awake, alert, hard of hearing  Imaging: US RENAL  Result Date: 03/11/2022 CLINICAL DATA:  Acute on chronic kidney disease. EXAM: RENAL / URINARY TRACT ULTRASOUND COMPLETE COMPARISON:  None Available. FINDINGS: Right Kidney: Renal measurements: 10.2 x 3.8 x 4.6 = volume: 93.9 mL. Echogenicity is increased. No hydronephrosis. There are 3 cysts in the right kidney. The largest is in the mid kidney measuring 1.2 x 1.1 x 1.2 cm. Left Kidney: Renal measurements: 10.8 x 5.4 x 5.0 cm = volume: 152 mL. Echogenicity is increased. No hydronephrosis. There is an inferior pole cyst measuring 5.3 x 4.3 x 5.3 cm. Bladder: Appears normal for degree of bladder distention. Other: None. IMPRESSION: 1. Echogenic kidneys likely related to medical renal disease. 2. No hydronephrosis. 3. Bilateral renal cysts. Electronically Signed   By: Ronney Asters M.D.   On: 03/11/2022 21:33   ECHOCARDIOGRAM COMPLETE  Result Date: 03/11/2022    ECHOCARDIOGRAM REPORT   Patient Name:   [name] Date of Exam: 03/11/2022 Medical Rec #:  [id number]  Height:       62.0 in Accession #:    [id number] Weight:       162.0 lb Date of Birth:  1928/09/25  BSA:          1.748 m Patient Age:    44 years   BP:  147/52 mmHg Patient Gender: F          HR:           65 bpm. Exam Location:  Inpatient Procedure: 2D Echo, 3D Echo, Cardiac Doppler and Color Doppler Indications:    R06.02 SOB  History:        Patient has no prior history of Echocardiogram examinations.                 Signs/Symptoms:Alzheimer's; Risk Factors:Diabetes and                 Hypertension. Covid positive.  Sonographer:    Roseanna Rainbow RDCS Referring Phys: [id number] BELKYS A REGALADO IMPRESSIONS  1. Left ventricular ejection fraction, by estimation, is 60 to 65%. Left  ventricular ejection fraction by 3D volume is 60 %. The left ventricle has normal function. The left ventricle has no regional wall motion abnormalities. There is moderate asymmetric left ventricular hypertrophy of the septal segment. Left ventricular diastolic parameters are consistent with Grade II diastolic dysfunction (pseudonormalization). There is the interventricular septum is flattened in diastole ('D' shaped left ventricle), consistent with right ventricular volume overload.  2. Right ventricular systolic function is normal. The right ventricular size is mildly enlarged. There is severely elevated pulmonary artery systolic pressure. The estimated right ventricular systolic pressure is 82.4 mmHg.  3. Left atrial size was moderately dilated.  4. The mitral valve is normal in structure. No evidence of mitral valve regurgitation.  5. Tricuspid valve regurgitation is mild to moderate.  6. The aortic valve is tricuspid. There is mild calcification of the aortic valve. Aortic valve regurgitation is not visualized. No aortic stenosis is present.  7. The inferior vena cava is dilated in size with <50% respiratory variability, suggesting right atrial pressure of 15 mmHg.  8. Cannot exclude a small PFO. Comparison(s): No prior Echocardiogram. FINDINGS  Left Ventricle: Left ventricular ejection fraction, by estimation, is 60 to 65%. Left ventricular ejection fraction by 3D volume is 60 %. The left ventricle has normal function. The left ventricle has no regional wall motion abnormalities. The left ventricular internal cavity size was normal in size. There is moderate asymmetric left ventricular hypertrophy of the septal segment. The interventricular septum is flattened in diastole ('D' shaped left ventricle), consistent with right ventricular volume overload. Left ventricular diastolic parameters are consistent with Grade II diastolic dysfunction (pseudonormalization). Right Ventricle: The right ventricular size is  mildly enlarged. No increase in right ventricular wall thickness. Right ventricular systolic function is normal. There is severely elevated pulmonary artery systolic pressure. The tricuspid regurgitant velocity is 4.04 m/s, and with an assumed right atrial pressure of 15 mmHg, the estimated right ventricular systolic pressure is 23.5 mmHg. Left Atrium: Left atrial size was moderately dilated. Right Atrium: Right atrial size was normal in size. Pericardium: There is no evidence of pericardial effusion. Mitral Valve: The mitral valve is normal in structure. No evidence of mitral valve regurgitation. MV peak gradient, 8.0 mmHg. The mean mitral valve gradient is 3.0 mmHg. Tricuspid Valve: The tricuspid valve is normal in structure. Tricuspid valve regurgitation is mild to moderate. No evidence of tricuspid stenosis. Aortic Valve: The aortic valve is tricuspid. There is mild calcification of the aortic valve. There is mild aortic valve annular calcification. Aortic valve regurgitation is not visualized. No aortic stenosis is present. Pulmonic Valve: The pulmonic valve was normal in structure. Pulmonic valve regurgitation is not visualized. No evidence of pulmonic stenosis. Aorta: The aortic root and ascending aorta  are structurally normal, with no evidence of dilitation. Venous: The inferior vena cava is dilated in size with less than 50% respiratory variability, suggesting right atrial pressure of 15 mmHg. IAS/Shunts: Cannot exclude a small PFO.  LEFT VENTRICLE PLAX 2D LVIDd:         4.05 cm         Diastology LVIDs:         2.50 cm         LV e' medial:    4.46 cm/s LV PW:         1.15 cm         LV E/e' medial:  25.1 LV IVS:        1.35 cm         LV e' lateral:   5.98 cm/s LVOT diam:     2.20 cm         LV E/e' lateral: 18.7 LV SV:         113 LV SV Index:   65 LVOT Area:     3.80 cm        3D Volume EF                                LV 3D EF:    Left                                             ventricul LV Volumes  (MOD)                            ar LV vol d, MOD    73.0 ml                    ejection A2C:                                        fraction LV vol d, MOD    76.1 ml                    by 3D A4C:                                        volume is LV vol s, MOD    25.9 ml                    60 %. A2C: LV vol s, MOD    22.4 ml A4C:                           3D Volume EF: LV SV MOD A2C:   47.1 ml       3D EF:        60 % LV SV MOD A4C:   76.1 ml       LV EDV:       95 ml LV SV MOD BP:    49.4 ml       LV ESV:       38 ml  LV SV:        58 ml RIGHT VENTRICLE            IVC RV S prime:     8.38 cm/s  IVC diam: 2.30 cm TAPSE (M-mode): 2.8 cm LEFT ATRIUM             Index        RIGHT ATRIUM           Index LA diam:        3.60 cm 2.06 cm/m   RA Area:     18.20 cm LA Vol (A2C):   61.8 ml 35.36 ml/m  RA Volume:   53.10 ml  30.38 ml/m LA Vol (A4C):   78.9 ml 45.14 ml/m LA Biplane Vol: 72.3 ml 41.36 ml/m  AORTIC VALVE LVOT Vmax:   134.50 cm/s LVOT Vmean:  88.400 cm/s LVOT VTI:    0.298 m  AORTA Ao Root diam: 3.00 cm Ao Asc diam:  3.40 cm MITRAL VALVE                TRICUSPID VALVE MV Area (PHT): 3.34 cm     TR Peak grad:   65.3 mmHg MV Area VTI:   2.41 cm     TR Vmax:        404.00 cm/s MV Peak grad:  8.0 mmHg MV Mean grad:  3.0 mmHg     SHUNTS MV Vmax:       1.41 m/s     Systemic VTI:  0.30 m MV Vmean:      72.3 cm/s    Systemic Diam: 2.20 cm MV Decel Time: 227 msec MV E velocity: 112.00 cm/s MV A velocity: 116.50 cm/s MV E/A ratio:  0.96 Rudean Haskell MD Electronically signed by Rudean Haskell MD Signature Date/Time: 03/11/2022/5:20:11 PM    Final     Labs: BMET Recent Labs  Lab 03/10/22 1146 03/10/22 1332 03/10/22 1637 03/11/22 0519 03/12/22 0455  NA 134*  --   --  139 138  K 3.7  --   --  4.1 3.6  CL 99  --   --  107 107  CO2 26  --   --  23 23  GLUCOSE 112*  --   --  142* 124*  BUN 24*  --   --  30* 39*  CREATININE 1.97*  --  1.95* 2.17* 2.03*  CALCIUM  9.2  --   --  8.6* 8.3*  PHOS  --  4.0  --  5.2* 4.8*   CBC Recent Labs  Lab 03/10/22 1146 03/11/22 0519 03/12/22 0455  WBC 5.7 3.2* 4.1  NEUTROABS 4.3 2.6 3.2  HGB 11.1* 10.0* 10.0*  HCT 34.3* 31.2* 31.3*  MCV 88.9 90.4 91.8  PLT 243 232 231    Medications:     albuterol  2 puff Inhalation BID   allopurinol  100 mg Oral BID   amLODipine  10 mg Oral Daily   vitamin C  500 mg Oral Daily   dexamethasone  6 mg Oral Daily   guaiFENesin  600 mg Oral BID   heparin  5,000 Units Subcutaneous Q8H   hydrALAZINE  100 mg Oral Q8H   insulin aspart  0-15 Units Subcutaneous TID WC   insulin aspart  0-5 Units Subcutaneous QHS   levETIRAcetam  500 mg Oral Daily   metoprolol tartrate  100 mg Oral BID   mirtazapine  15 mg Oral QHS   molnupiravir EUA  4 capsule Oral  BID   QUEtiapine  25 mg Oral QHS   sodium chloride flush  3 mL Intravenous Q12H   zinc sulfate  220 mg Oral Daily      Gean Quint, MD [facility] 03/12/2022, 12:12 PM

## 2022-03-12 NOTE — Progress Notes (Signed)
O2 Sats 97-98% 2L/[place], remained at 92% 2L/[place] after pt ambulated back from BR. Pt currently 99% 1L/. States she is breathing "better." Will continue to monitor closely.

## 2022-03-12 NOTE — Progress Notes (Signed)
Physical Therapy Treatment Patient Details Name: [name] MRN: [id number] DOB: [birth year] Today's Date: 03/12/2022   History of Present Illness 88 yo female admitted with COVID Pna. Hx of dementia, DM, CKD    PT Comments    Pt attempting to eat breakfast lying in bed. Assisted pt out of bed and over to recliner so she could finish eating breakfast in better positioning. Remained on [place] O2 during session. Will continue to follow pt during this hospital stay.     Recommendations for follow up therapy are one component of a multi-disciplinary discharge planning process, led by the attending physician.  Recommendations may be updated based on patient status, additional functional criteria and insurance authorization.  Follow Up Recommendations  Home health PT     Assistance Recommended at Discharge PRN  Patient can return home with the following A little help with walking and/or transfers;Assist for transportation;Assistance with cooking/housework;Help with stairs or ramp for entrance   Equipment Recommendations  None recommended by PT    Recommendations for Other Services       Precautions / Restrictions Precautions Precautions: Fall Precaution Comments: monitor O2 Restrictions Weight Bearing Restrictions: No     Mobility  Bed Mobility Overal bed mobility: Needs Assistance Bed Mobility: Supine to Sit     Supine to sit: Supervision, HOB elevated     General bed mobility comments: Supv for safety, lines. Increased time.    Transfers Overall transfer level: Needs assistance   Transfers: Sit to/from Stand, Bed to chair/wheelchair/BSC Sit to Stand: Supervision   Step pivot transfers: Supervision       General transfer comment: Supv for safety, lines. Pt was able to step over to recliner without difficulty but with increased time. Assisted pt over to the recliner to finish eating her breakfast. Remained on [place] O2 during session.    Ambulation/Gait                    Stairs             Wheelchair Mobility    Modified Rankin (Stroke Patients Only)       Balance Overall balance assessment: Mild deficits observed, not formally tested                                          Cognition Arousal/Alertness: Awake/alert Behavior During Therapy: WFL for tasks assessed/performed Overall Cognitive Status: History of cognitive impairments - at baseline                                 General Comments: follows commands well. pleasant.        Exercises      General Comments        Pertinent Vitals/Pain Pain Assessment Pain Assessment: No/denies pain    Home Living                          Prior Function            PT Goals (current goals can now be found in the care plan section) Progress towards PT goals: Progressing toward goals    Frequency    Min 3X/week      PT Plan Current plan remains appropriate    Co-evaluation  AM-PAC PT "6 Clicks" Mobility   Outcome Measure  Help needed turning from your back to your side while in a flat bed without using bedrails?: None Help needed moving from lying on your back to sitting on the side of a flat bed without using bedrails?: None Help needed moving to and from a bed to a chair (including a wheelchair)?: None Help needed standing up from a chair using your arms (e.g., wheelchair or bedside chair)?: None Help needed to walk in hospital room?: A Little Help needed climbing 3-5 steps with a railing? : A Little 6 Click Score: 22    End of Session Equipment Utilized During Treatment: Oxygen Activity Tolerance: Patient tolerated treatment well Patient left: in chair;with call bell/phone within reach;with chair alarm set   PT Visit Diagnosis: Difficulty in walking, not elsewhere classified (R26.2)     Time: 0950-1002 PT Time Calculation (min) (ACUTE ONLY): 12 min  Charges:  $Gait Training: 8-22 mins                          Doreatha Massed, PT Acute Rehabilitation  Office: [phone] Pager: [phone]

## 2022-03-12 NOTE — Progress Notes (Signed)
PROGRESS NOTE    [name]  UEA:[id number] DOB: 01-Dec-1928 DOA: 03/10/2022 PCP: Seward Carol, MD   Brief Narrative: 88 year old with past medical history significant for dementia, diabetes, GERD, hypertension, CKD stage IV, presents with generalized weakness body ache, cough and shortness of breath, with sick contact with COVID found to have Covid  Pneumonia, AKI on CKD stage IV and acute on chronic diastolic heart failure exacerbation.  He was a started on molnupiravir, IV ceftriaxone and azithromycin.  Assessment & Plan:   Principal Problem:   Pneumonia due to COVID-19 virus Active Problems:   UTI (urinary tract infection)   Dementia ([facility])   AKI (acute kidney injury) ([facility])   Type 2 diabetes mellitus with complication, without long-term current use of insulin ([facility])   Glaucoma   CKD (chronic kidney disease), stage IV ([facility])  1-Pneumonia secondary to COVID-19, Acute Hypoxic Respiratory failure.  Patient presented with shortness of breath, cough,  oxygen saturation 92% on room air, she was placed on 2 L of oxygen.  COVID PCR positive.  She has a sick contact daughter recently diagnosed with COVID as well. -Continue with IV ceftriaxone and azithromycin to cover for superimposed pneumonia -Oxygen sat on Room Air was at 85, stable on 2-3 L oxygen.  -Patient was a started on molnupiravir continue she will need 5 days course. Day 3.  -Continue with oxygen supplementation and nebulizer -Continue with oral Decadron 6 mg daily for 10 days. -on guaifenesin  stable.    2-AKI on CKD stage 3 b-4.  Prior creatinine per records 1.6 Lab work from PCP office GFR 29 cr 1.02 November 2021.  Creatinine on admission 1.9--2.1 Nephrology consulted, received IV fluids. AKI could have be multifactorial; dehydration , UTI, Covid.      3-Dementia, Depression;  Resume Mirtazapine, Seroquel.    Acute on chronic Diastolic Heart failure exacerbation/  Presents with dyspnea, elevated BNP, Positive JVD.   Received 20 mg IV lasix  She takes 40 mg oral lasix.  Nephrologist to assist with diuretics. Plan to hold diuretics for now, resume at discharge.  ECHO: Normal EF, Grade 2 diastolic dysfunction.   Diabetes type 2; SSI. Hold oral meds.  Might need long acting if cbg increases.    History of seizure: Continue with Keppra   Mild hyponatremia: Monitor.    UTI; Pyuria;  UA with 50 WBC.  Follow urine culture. Growing 100,000 Klebsiella pneumonia , sensitive to ceftriaxone. Continue with ceftriaxone.  Blood culture no growth to date.   Elevated D dimer in setting Covid. DVT prophylaxis. D dimer trending down.  HTN; Continue with hydralazine and norvasc.     Estimated body mass index is 29.63 kg/m as calculated from the following:   Height as of this encounter: '5\' 2"'$  (1.575 m).   Weight as of this encounter: 73.5 kg.   DVT prophylaxis: Heparin  Code Status: Full code Family Communication: Daughter over phone 8/13 Disposition Plan:  Status is: Inpatient Remains inpatient appropriate because: management of Covid PNA    Consultants:  Nephrology   Procedures:  ECHO  Antimicrobials:    Subjective: Patient is feeling better, she denies worsening shortness of breath.  Cough stable.  Objective: Vitals:   03/11/22 1345 03/11/22 2036 03/12/22 0521 03/12/22 0815  BP: (!) 157/62 (!) 149/56 (!) 160/59   Pulse: 67 66 62   Resp: '18 17 19   '$ Temp: 97.9 F (36.6 C) 98 F (36.7 C) 97.8 F (36.6 C)   TempSrc: Oral Oral Oral  SpO2: 93% 97% 100% 98%  Weight:      Height:        Intake/Output Summary (Last 24 hours) at 03/12/2022 0837 Last data filed at 03/12/2022 0300 Gross per 24 hour  Intake 478.38 ml  Output --  Net 478.38 ml    Filed Weights   03/10/22 1025  Weight: 73.5 kg    Examination:  General exam: NAD Respiratory system: CTA Cardiovascular system: S 1, S 2 RRR Gastrointestinal system: BS present, soft nt Central nervous system: Non focal.   Extremities: no edema    Data Reviewed: I have personally reviewed following labs and imaging studies  CBC: Recent Labs  Lab 03/10/22 1146 03/11/22 0519 03/12/22 0455  WBC 5.7 3.2* 4.1  NEUTROABS 4.3 2.6 3.2  HGB 11.1* 10.0* 10.0*  HCT 34.3* 31.2* 31.3*  MCV 88.9 90.4 91.8  PLT 243 232 202    Basic Metabolic Panel: Recent Labs  Lab 03/10/22 1146 03/10/22 1332 03/10/22 1637 03/11/22 0519 03/12/22 0455  NA 134*  --   --  139 138  K 3.7  --   --  4.1 3.6  CL 99  --   --  107 107  CO2 26  --   --  23 23  GLUCOSE 112*  --   --  142* 124*  BUN 24*  --   --  30* 39*  CREATININE 1.97*  --  1.95* 2.17* 2.03*  CALCIUM 9.2  --   --  8.6* 8.3*  MG  --  1.7  --  1.9 1.9  PHOS  --  4.0  --  5.2* 4.8*    GFR: Estimated Creatinine Clearance: 17 mL/min (A) (by C-G formula based on SCr of 2.03 mg/dL (H)). Liver Function Tests: Recent Labs  Lab 03/11/22 0519 03/12/22 0455  AST 21 20  ALT 18 16  ALKPHOS 167* 157*  BILITOT 0.2* 0.2*  PROT 7.1 7.2  ALBUMIN 3.0* 3.1*    No results for input(s): "LIPASE", "AMYLASE" in the last 168 hours. No results for input(s): "AMMONIA" in the last 168 hours. Coagulation Profile: No results for input(s): "INR", "PROTIME" in the last 168 hours. Cardiac Enzymes: No results for input(s): "CKTOTAL", "CKMB", "CKMBINDEX", "TROPONINI" in the last 168 hours. BNP (last 3 results) No results for input(s): "PROBNP" in the last 8760 hours. HbA1C: Recent Labs    03/10/22 1652  HGBA1C 6.4*    CBG: Recent Labs  Lab 03/11/22 0746 03/11/22 1243 03/11/22 1713 03/11/22 2033 03/12/22 0818  GLUCAP 128* 141* 168* 123* 108*    Lipid Profile: No results for input(s): "CHOL", "HDL", "LDLCALC", "TRIG", "CHOLHDL", "LDLDIRECT" in the last 72 hours. Thyroid Function Tests: No results for input(s): "TSH", "T4TOTAL", "FREET4", "T3FREE", "THYROIDAB" in the last 72 hours. Anemia Panel: Recent Labs    03/11/22 0519 03/12/22 0455  FERRITIN 70 69     Sepsis Labs: Recent Labs  Lab 03/10/22 1332  PROCALCITON <0.10     Recent Results (from the past 240 hour(s))  SARS Coronavirus 2 by RT PCR (hospital order, performed in [facility] hospital lab) *cepheid single result test* Anterior Nasal Swab     Status: Abnormal   Collection Time: 03/10/22 10:28 AM   Specimen: Anterior Nasal Swab  Result Value Ref Range Status   SARS Coronavirus 2 by RT PCR POSITIVE (A) NEGATIVE Final    Comment: (NOTE) SARS-CoV-2 target nucleic acids are DETECTED  SARS-CoV-2 RNA is generally detectable in upper respiratory specimens  during the acute phase  of infection.  Positive results are indicative  of the presence of the identified virus, but do not rule out bacterial infection or co-infection with other pathogens not detected by the test.  Clinical correlation with patient history and  other diagnostic information is necessary to determine patient infection status.  The expected result is negative.  Fact Sheet for Patients:   https://www.patel.info/   Fact Sheet for Healthcare Providers:   https://hall.com/    This test is not yet approved or cleared by the [place] FDA and  has been authorized for detection and/or diagnosis of SARS-CoV-2 by FDA under an Emergency Use Authorization (EUA).  This EUA will remain in effect (meaning this test can be used) for the duration of  the COVID-19 declaration under Section 564(b)(1)  of the Act, 21 U.S.C. section 360-bbb-3(b)(1), unless the authorization is terminated or revoked sooner.   Performed at [facility], [address] [place], [place] [address]   Urine Culture     Status: Abnormal (Preliminary result)   Collection Time: 03/10/22  1:25 PM   Specimen: Urine, Clean Catch  Result Value Ref Range Status   Specimen Description   Final    URINE, CLEAN CATCH Performed at [facility], [address],  [place], [place] [address]    Special Requests   Final    NONE Performed at [facility], [address] [place], [place] [address]    Culture >=100,000 COLONIES/mL KLEBSIELLA PNEUMONIAE (A)  Final   Report Status PENDING  Incomplete  Culture, blood (routine x 2) Call MD if unable to obtain prior to antibiotics being given     Status: None (Preliminary result)   Collection Time: 03/10/22  1:55 PM   Specimen: BLOOD  Result Value Ref Range Status   Specimen Description   Final    BLOOD LEFT ANTECUBITAL BOTTLES DRAWN AEROBIC AND ANAEROBIC Blood Culture adequate volume Performed at [facility], [address], [place], [place] [address]    Special Requests   Final    NONE Performed at [facility], [address], [place], [place] [address]    Culture   Final    NO GROWTH 2 DAYS Performed at [facility], [place] [address], [place], [place] [address]    Report Status PENDING  Incomplete  Culture, blood (routine x 2) Call MD if unable to obtain prior to antibiotics being given     Status: None (Preliminary result)   Collection Time: 03/10/22  2:00 PM   Specimen: BLOOD RIGHT HAND  Result Value Ref Range Status   Specimen Description   Final    BLOOD RIGHT HAND BOTTLES DRAWN AEROBIC AND ANAEROBIC Blood Culture adequate volume Performed at [facility], [address] [place], [place] [address]    Special Requests   Final    NONE Performed at [facility], [address], [place], [place] [address]    Culture   Final    NO GROWTH 2 DAYS Performed at [facility], [place] [address] [place], [place] [address]    Report Status PENDING  Incomplete         Radiology Studies: US RENAL  Result Date: 03/11/2022 CLINICAL DATA:  Acute on chronic kidney disease. EXAM: RENAL / URINARY TRACT ULTRASOUND COMPLETE COMPARISON:  None Available. FINDINGS: Right Kidney: Renal measurements:  10.2 x 3.8 x 4.6 = volume: 93.9 mL. Echogenicity is increased. No hydronephrosis. There are 3 cysts in the right kidney.  The largest is in the mid kidney measuring 1.2 x 1.1 x 1.2 cm. Left Kidney: Renal measurements: 10.8 x 5.4 x 5.0 cm = volume: 152 mL. Echogenicity is increased. No hydronephrosis. There is an inferior pole cyst measuring 5.3 x 4.3 x 5.3 cm. Bladder: Appears normal for degree of bladder distention. Other: None. IMPRESSION: 1. Echogenic kidneys likely related to medical renal disease. 2. No hydronephrosis. 3. Bilateral renal cysts. Electronically Signed   By: Ronney Asters M.D.   On: 03/11/2022 21:33   ECHOCARDIOGRAM COMPLETE  Result Date: 03/11/2022    ECHOCARDIOGRAM REPORT   Patient Name:   [name] Date of Exam: 03/11/2022 Medical Rec #:  [id number]  Height:       62.0 in Accession #:    [id number] Weight:       162.0 lb Date of Birth:  1928-11-27  BSA:          1.748 m Patient Age:    82 years   BP:           147/52 mmHg Patient Gender: F          HR:           65 bpm. Exam Location:  Inpatient Procedure: 2D Echo, 3D Echo, Cardiac Doppler and Color Doppler Indications:    R06.02 SOB  History:        Patient has no prior history of Echocardiogram examinations.                 Signs/Symptoms:Alzheimer's; Risk Factors:Diabetes and                 Hypertension. Covid positive.  Sonographer:    Roseanna Rainbow RDCS Referring Phys: [id number] A Kamirah Shugrue IMPRESSIONS  1. Left ventricular ejection fraction, by estimation, is 60 to 65%. Left ventricular ejection fraction by 3D volume is 60 %. The left ventricle has normal function. The left ventricle has no regional wall motion abnormalities. There is moderate asymmetric left ventricular hypertrophy of the septal segment. Left ventricular diastolic parameters are consistent with Grade II diastolic dysfunction (pseudonormalization). There is the interventricular septum is flattened in diastole ('D' shaped left ventricle), consistent with right ventricular  volume overload.  2. Right ventricular systolic function is normal. The right ventricular size is mildly enlarged. There is severely elevated pulmonary artery systolic pressure. The estimated right ventricular systolic pressure is 62.1 mmHg.  3. Left atrial size was moderately dilated.  4. The mitral valve is normal in structure. No evidence of mitral valve regurgitation.  5. Tricuspid valve regurgitation is mild to moderate.  6. The aortic valve is tricuspid. There is mild calcification of the aortic valve. Aortic valve regurgitation is not visualized. No aortic stenosis is present.  7. The inferior vena cava is dilated in size with <50% respiratory variability, suggesting right atrial pressure of 15 mmHg.  8. Cannot exclude a small PFO. Comparison(s): No prior Echocardiogram. FINDINGS  Left Ventricle: Left ventricular ejection fraction, by estimation, is 60 to 65%. Left ventricular ejection fraction by 3D volume is 60 %. The left ventricle has normal function. The left ventricle has no regional wall motion abnormalities. The left ventricular internal cavity size was normal in size. There is moderate asymmetric left ventricular hypertrophy of the septal segment. The interventricular septum is flattened in diastole ('D' shaped left ventricle), consistent with right ventricular volume overload. Left ventricular diastolic parameters are consistent with Grade II diastolic dysfunction (pseudonormalization). Right Ventricle: The right ventricular size is mildly enlarged. No  increase in right ventricular wall thickness. Right ventricular systolic function is normal. There is severely elevated pulmonary artery systolic pressure. The tricuspid regurgitant velocity is 4.04 m/s, and with an assumed right atrial pressure of 15 mmHg, the estimated right ventricular systolic pressure is 16.0 mmHg. Left Atrium: Left atrial size was moderately dilated. Right Atrium: Right atrial size was normal in size. Pericardium: There is no  evidence of pericardial effusion. Mitral Valve: The mitral valve is normal in structure. No evidence of mitral valve regurgitation. MV peak gradient, 8.0 mmHg. The mean mitral valve gradient is 3.0 mmHg. Tricuspid Valve: The tricuspid valve is normal in structure. Tricuspid valve regurgitation is mild to moderate. No evidence of tricuspid stenosis. Aortic Valve: The aortic valve is tricuspid. There is mild calcification of the aortic valve. There is mild aortic valve annular calcification. Aortic valve regurgitation is not visualized. No aortic stenosis is present. Pulmonic Valve: The pulmonic valve was normal in structure. Pulmonic valve regurgitation is not visualized. No evidence of pulmonic stenosis. Aorta: The aortic root and ascending aorta are structurally normal, with no evidence of dilitation. Venous: The inferior vena cava is dilated in size with less than 50% respiratory variability, suggesting right atrial pressure of 15 mmHg. IAS/Shunts: Cannot exclude a small PFO.  LEFT VENTRICLE PLAX 2D LVIDd:         4.05 cm         Diastology LVIDs:         2.50 cm         LV e' medial:    4.46 cm/s LV PW:         1.15 cm         LV E/e' medial:  25.1 LV IVS:        1.35 cm         LV e' lateral:   5.98 cm/s LVOT diam:     2.20 cm         LV E/e' lateral: 18.7 LV SV:         113 LV SV Index:   65 LVOT Area:     3.80 cm        3D Volume EF                                LV 3D EF:    Left                                             ventricul LV Volumes (MOD)                            ar LV vol d, MOD    73.0 ml                    ejection A2C:                                        fraction LV vol d, MOD    76.1 ml                    by 3D A4C:  volume is LV vol s, MOD    25.9 ml                    60 %. A2C: LV vol s, MOD    22.4 ml A4C:                           3D Volume EF: LV SV MOD A2C:   47.1 ml       3D EF:        60 % LV SV MOD A4C:   76.1 ml       LV EDV:       95 ml  LV SV MOD BP:    49.4 ml       LV ESV:       38 ml                                LV SV:        58 ml RIGHT VENTRICLE            IVC RV S prime:     8.38 cm/s  IVC diam: 2.30 cm TAPSE (M-mode): 2.8 cm LEFT ATRIUM             Index        RIGHT ATRIUM           Index LA diam:        3.60 cm 2.06 cm/m   RA Area:     18.20 cm LA Vol (A2C):   61.8 ml 35.36 ml/m  RA Volume:   53.10 ml  30.38 ml/m LA Vol (A4C):   78.9 ml 45.14 ml/m LA Biplane Vol: 72.3 ml 41.36 ml/m  AORTIC VALVE LVOT Vmax:   134.50 cm/s LVOT Vmean:  88.400 cm/s LVOT VTI:    0.298 m  AORTA Ao Root diam: 3.00 cm Ao Asc diam:  3.40 cm MITRAL VALVE                TRICUSPID VALVE MV Area (PHT): 3.34 cm     TR Peak grad:   65.3 mmHg MV Area VTI:   2.41 cm     TR Vmax:        404.00 cm/s MV Peak grad:  8.0 mmHg MV Mean grad:  3.0 mmHg     SHUNTS MV Vmax:       1.41 m/s     Systemic VTI:  0.30 m MV Vmean:      72.3 cm/s    Systemic Diam: 2.20 cm MV Decel Time: 227 msec MV E velocity: 112.00 cm/s MV A velocity: 116.50 cm/s MV E/A ratio:  0.96 Rudean Haskell MD Electronically signed by Rudean Haskell MD Signature Date/Time: 03/11/2022/5:20:11 PM    Final    DG Chest Port 1 View  Result Date: 03/10/2022 CLINICAL DATA:  Cough EXAM: PORTABLE CHEST 1 VIEW COMPARISON:  12/29/2020 and prior radiographs FINDINGS: Cardiomegaly identified. Slightly increased interstitial/patchy opacities within the mid lungs bilaterally noted likely representing infection. There is no evidence of pleural effusion or pneumothorax. No acute bony abnormalities are present. Severe glenohumeral joint degenerative changes again noted IMPRESSION: 1. Slightly increased interstitial/patchy opacities within the mid lungs bilaterally likely representing infection. 2. Cardiomegaly. Electronically Signed   By: Margarette Canada M.D.   On: 03/10/2022 12:41        Scheduled Meds:  albuterol  2 puff Inhalation BID   allopurinol  100 mg Oral BID   amLODipine  10 mg Oral Daily    vitamin C  500 mg Oral Daily   dexamethasone  6 mg Oral Daily   guaiFENesin  600 mg Oral BID   heparin  5,000 Units Subcutaneous Q8H   hydrALAZINE  100 mg Oral Q8H   insulin aspart  0-15 Units Subcutaneous TID WC   insulin aspart  0-5 Units Subcutaneous QHS   levETIRAcetam  500 mg Oral Daily   metoprolol tartrate  100 mg Oral BID   mirtazapine  15 mg Oral QHS   molnupiravir EUA  4 capsule Oral BID   QUEtiapine  25 mg Oral QHS   sodium chloride flush  3 mL Intravenous Q12H   zinc sulfate  220 mg Oral Daily   Continuous Infusions:  sodium chloride     sodium chloride 85 mL/hr at 03/11/22 2123   azithromycin Stopped (03/11/22 1900)   cefTRIAXone (ROCEPHIN)  IV 2 g (03/11/22 1307)     LOS: 2 days    Time spent: 35 minutes.     Elmarie Shiley, MD [facility]   If 7PM-7AM, please contact night-coverage [email or web address]  03/12/2022, 8:37 AM

## 2022-03-12 NOTE — TOC Progression Note (Signed)
Transition of Care [facility]) - Progression Note    Patient Details  Name: [name] MRN: [id number] Date of Birth: 1929/01/10  [facility] of [facility]) CM/SW Contact  Marikay Roads, Juliann Pulse, RN Phone Number: 03/12/2022, 9:47 AM  Clinical Narrative: spoke to dtr [name] dtr will check on [facility] agency for HHPT. Await response.      Expected Discharge Plan: [place] Barriers to Discharge: Continued Medical Work up  Expected Discharge Plan and Services Expected Discharge Plan: [place]                                               Social Determinants of Health (SDOH) Interventions    Readmission Risk Interventions     No data to display

## 2022-03-13 DIAGNOSIS — J1282 Pneumonia due to coronavirus disease 2019: Secondary | ICD-10-CM | POA: Diagnosis not present

## 2022-03-13 DIAGNOSIS — U071 COVID-19: Secondary | ICD-10-CM | POA: Diagnosis not present

## 2022-03-13 LAB — CBC WITH DIFFERENTIAL/PLATELET
Abs Immature Granulocytes: 0.03 10*3/uL (ref 0.00–0.07)
Basophils Absolute: 0 10*3/uL (ref 0.0–0.1)
Basophils Relative: 0 %
Eosinophils Absolute: 0 10*3/uL (ref 0.0–0.5)
Eosinophils Relative: 0 %
HCT: 30.1 % — ABNORMAL LOW (ref 36.0–46.0)
Hemoglobin: 9.7 g/dL — ABNORMAL LOW (ref 12.0–15.0)
Immature Granulocytes: 1 %
Lymphocytes Relative: 15 %
Lymphs Abs: 0.7 10*3/uL (ref 0.7–4.0)
MCH: 29.3 pg (ref 26.0–34.0)
MCHC: 32.2 g/dL (ref 30.0–36.0)
MCV: 90.9 fL (ref 80.0–100.0)
Monocytes Absolute: 0.3 10*3/uL (ref 0.1–1.0)
Monocytes Relative: 7 %
Neutro Abs: 3.9 10*3/uL (ref 1.7–7.7)
Neutrophils Relative %: 77 %
Platelets: 220 10*3/uL (ref 150–400)
RBC: 3.31 MIL/uL — ABNORMAL LOW (ref 3.87–5.11)
RDW: 15.3 % (ref 11.5–15.5)
WBC: 5 10*3/uL (ref 4.0–10.5)
nRBC: 0 % (ref 0.0–0.2)

## 2022-03-13 LAB — COMPREHENSIVE METABOLIC PANEL
ALT: 15 U/L (ref 0–44)
AST: 18 U/L (ref 15–41)
Albumin: 2.9 g/dL — ABNORMAL LOW (ref 3.5–5.0)
Alkaline Phosphatase: 146 U/L — ABNORMAL HIGH (ref 38–126)
Anion gap: 6 (ref 5–15)
BUN: 38 mg/dL — ABNORMAL HIGH (ref 8–23)
CO2: 22 mmol/L (ref 22–32)
Calcium: 8.5 mg/dL — ABNORMAL LOW (ref 8.9–10.3)
Chloride: 112 mmol/L — ABNORMAL HIGH (ref 98–111)
Creatinine, Ser: 1.8 mg/dL — ABNORMAL HIGH (ref 0.44–1.00)
GFR, Estimated: 26 mL/min — ABNORMAL LOW (ref >60)
Glucose, Bld: 104 mg/dL — ABNORMAL HIGH (ref 70–99)
Potassium: 3.9 mmol/L (ref 3.5–5.1)
Sodium: 140 mmol/L (ref 135–145)
Total Bilirubin: 0.2 mg/dL — ABNORMAL LOW (ref 0.3–1.2)
Total Protein: 6.8 g/dL (ref 6.5–8.1)

## 2022-03-13 LAB — GLUCOSE, CAPILLARY
Glucose-Capillary: 102 mg/dL — ABNORMAL HIGH (ref 70–99)
Glucose-Capillary: 122 mg/dL — ABNORMAL HIGH (ref 70–99)
Glucose-Capillary: 186 mg/dL — ABNORMAL HIGH (ref 70–99)
Glucose-Capillary: 163 mg/dL — ABNORMAL HIGH (ref 70–99)

## 2022-03-13 LAB — C-REACTIVE PROTEIN: CRP: 0.5 mg/dL (ref <1.0)

## 2022-03-13 LAB — PHOSPHORUS: Phosphorus: 4.1 mg/dL (ref 2.5–4.6)

## 2022-03-13 LAB — MAGNESIUM: Magnesium: 2 mg/dL (ref 1.7–2.4)

## 2022-03-13 NOTE — Progress Notes (Signed)
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 Stopped IVF. She does have good appetite and is hydrating adequately--remains euvolemic on exam. AKI likely related to dehydration (hyaline casts, COVID, UTI) +/- progression of CKD. Not suspecting CRS as a primary cause given her clinical picture. Ultrasound without obstruction -Cr down to 1.8 today even off fluids -Avoid nephrotoxic medications including NSAIDs and iodinated intravenous contrast exposure unless the latter is absolutely indicated.  Preferred narcotic agents for pain control are hydromorphone, fentanyl, and methadone. Morphine should not be used. Avoid Baclofen and avoid oral sodium phosphate and magnesium citrate based laxatives / bowel preps. Continue strict Input and Output monitoring. Will monitor the patient closely with you and intervene or adjust therapy as indicated by changes in clinical status/labs   AHRF secondary COVID-19 PNA -per primary service  H/o Dementia -per primary service  HTN -resume home meds, BP acceptable. Can utilize amlodipine if needed  Normocytic anemia -stable  Acute on chronic dCHF -echo 8/13 consistent with some right heart vol overload, will stop IVF. Hold off on giving lasix for now but can resume home lasix on discharge (provided she does not need any doses in the interim)  UTI, pyuria -on rocephin, per primary  Nothing else to add from a nephrology perspective at this time. Will sign off. Can be referred from PCP's office if needed. Can resume lasix PO on discharge.  Gean Quint, MD [facility]  Subjective:   No acute events. No complaints. Breathing is stable as per patient and daughter who is bedside. No swelling.   Objective:   BP (!) 174/53 (BP Location: Right Arm)   Pulse (!) 59   Temp 98.1 F (36.7 C)   Resp 18   Ht '5\' 2"'$  (1.575 m)   Wt 73.5 kg   SpO2 99%    BMI 29.63 kg/m   Intake/Output Summary (Last 24 hours) at 03/13/2022 1309 Last data filed at 03/13/2022 4098 Gross per 24 hour  Intake 240 ml  Output --  Net 240 ml   Weight change:   Physical Exam: Gen:NAD, nontoxic, sitting up in chair CVS:RRR Resp:normal WOB, nasal cannula in place JXB:JYNW, nt/nd Ext:no sig edema Neuro: awake, alert, hard of hearing  Imaging: US RENAL  Result Date: 03/11/2022 CLINICAL DATA:  Acute on chronic kidney disease. EXAM: RENAL / URINARY TRACT ULTRASOUND COMPLETE COMPARISON:  None Available. FINDINGS: Right Kidney: Renal measurements: 10.2 x 3.8 x 4.6 = volume: 93.9 mL. Echogenicity is increased. No hydronephrosis. There are 3 cysts in the right kidney. The largest is in the mid kidney measuring 1.2 x 1.1 x 1.2 cm. Left Kidney: Renal measurements: 10.8 x 5.4 x 5.0 cm = volume: 152 mL. Echogenicity is increased. No hydronephrosis. There is an inferior pole cyst measuring 5.3 x 4.3 x 5.3 cm. Bladder: Appears normal for degree of bladder distention. Other: None. IMPRESSION: 1. Echogenic kidneys likely related to medical renal disease. 2. No hydronephrosis. 3. Bilateral renal cysts. Electronically Signed   By: Ronney Asters M.D.   On: 03/11/2022 21:33   ECHOCARDIOGRAM COMPLETE  Result Date: 03/11/2022    ECHOCARDIOGRAM REPORT   Patient Name:   KEIRSTYN AYDT Date of Exam: 03/11/2022 Medical Rec #:  [id number]  Height:       62.0 in Accession #:    [id number] Weight:       162.0 lb Date of Birth:  [birth year]  BSA:          1.748 m Patient Age:    23 years   BP:           147/52 mmHg Patient Gender: F          HR:           65 bpm. Exam Location:  Inpatient Procedure: 2D Echo, 3D Echo, Cardiac Doppler and Color Doppler Indications:    R06.02 SOB  History:        Patient has no prior history of Echocardiogram examinations.                 Signs/Symptoms:Alzheimer's; Risk Factors:Diabetes and                 Hypertension. Covid positive.  Sonographer:    Roseanna Rainbow RDCS  Referring Phys: [id number] BELKYS A REGALADO IMPRESSIONS  1. Left ventricular ejection fraction, by estimation, is 60 to 65%. Left ventricular ejection fraction by 3D volume is 60 %. The left ventricle has normal function. The left ventricle has no regional wall motion abnormalities. There is moderate asymmetric left ventricular hypertrophy of the septal segment. Left ventricular diastolic parameters are consistent with Grade II diastolic dysfunction (pseudonormalization). There is the interventricular septum is flattened in diastole ('D' shaped left ventricle), consistent with right ventricular volume overload.  2. Right ventricular systolic function is normal. The right ventricular size is mildly enlarged. There is severely elevated pulmonary artery systolic pressure. The estimated right ventricular systolic pressure is 43.1 mmHg.  3. Left atrial size was moderately dilated.  4. The mitral valve is normal in structure. No evidence of mitral valve regurgitation.  5. Tricuspid valve regurgitation is mild to moderate.  6. The aortic valve is tricuspid. There is mild calcification of the aortic valve. Aortic valve regurgitation is not visualized. No aortic stenosis is present.  7. The inferior vena cava is dilated in size with <50% respiratory variability, suggesting right atrial pressure of 15 mmHg.  8. Cannot exclude a small PFO. Comparison(s): No prior Echocardiogram. FINDINGS  Left Ventricle: Left ventricular ejection fraction, by estimation, is 60 to 65%. Left ventricular ejection fraction by 3D volume is 60 %. The left ventricle has normal function. The left ventricle has no regional wall motion abnormalities. The left ventricular internal cavity size was normal in size. There is moderate asymmetric left ventricular hypertrophy of the septal segment. The interventricular septum is flattened in diastole ('D' shaped left ventricle), consistent with right ventricular volume overload. Left ventricular diastolic parameters  are consistent with Grade II diastolic dysfunction (pseudonormalization). Right Ventricle: The right ventricular size is mildly enlarged. No increase in right ventricular wall thickness. Right ventricular systolic function is normal. There is severely elevated pulmonary artery systolic pressure. The tricuspid regurgitant velocity is 4.04 m/s, and with an assumed right atrial pressure of 15 mmHg, the estimated right ventricular systolic pressure is 54.0 mmHg. Left Atrium: Left atrial size was moderately dilated. Right Atrium: Right atrial size was normal in size. Pericardium: There is no evidence of pericardial effusion. Mitral Valve: The mitral valve is normal in structure. No evidence of mitral valve regurgitation. MV peak gradient, 8.0 mmHg. The mean mitral valve gradient is 3.0 mmHg. Tricuspid Valve: The tricuspid valve is normal in structure. Tricuspid valve regurgitation is mild to moderate. No evidence of tricuspid stenosis. Aortic Valve: The aortic valve is tricuspid. There is mild calcification of the aortic valve. There is mild aortic valve annular calcification. Aortic valve regurgitation is  not visualized. No aortic stenosis is present. Pulmonic Valve: The pulmonic valve was normal in structure. Pulmonic valve regurgitation is not visualized. No evidence of pulmonic stenosis. Aorta: The aortic root and ascending aorta are structurally normal, with no evidence of dilitation. Venous: The inferior vena cava is dilated in size with less than 50% respiratory variability, suggesting right atrial pressure of 15 mmHg. IAS/Shunts: Cannot exclude a small PFO.  LEFT VENTRICLE PLAX 2D LVIDd:         4.05 cm         Diastology LVIDs:         2.50 cm         LV e' medial:    4.46 cm/s LV PW:         1.15 cm         LV E/e' medial:  25.1 LV IVS:        1.35 cm         LV e' lateral:   5.98 cm/s LVOT diam:     2.20 cm         LV E/e' lateral: 18.7 LV SV:         113 LV SV Index:   65 LVOT Area:     3.80 cm        3D  Volume EF                                LV 3D EF:    Left                                             ventricul LV Volumes (MOD)                            ar LV vol d, MOD    73.0 ml                    ejection A2C:                                        fraction LV vol d, MOD    76.1 ml                    by 3D A4C:                                        volume is LV vol s, MOD    25.9 ml                    60 %. A2C: LV vol s, MOD    22.4 ml A4C:                           3D Volume EF: LV SV MOD A2C:   47.1 ml       3D EF:        60 % LV SV MOD A4C:   76.1 ml       LV EDV:  95 ml LV SV MOD BP:    49.4 ml       LV ESV:       38 ml                                LV SV:        58 ml RIGHT VENTRICLE            IVC RV S prime:     8.38 cm/s  IVC diam: 2.30 cm TAPSE (M-mode): 2.8 cm LEFT ATRIUM             Index        RIGHT ATRIUM           Index LA diam:        3.60 cm 2.06 cm/m   RA Area:     18.20 cm LA Vol (A2C):   61.8 ml 35.36 ml/m  RA Volume:   53.10 ml  30.38 ml/m LA Vol (A4C):   78.9 ml 45.14 ml/m LA Biplane Vol: 72.3 ml 41.36 ml/m  AORTIC VALVE LVOT Vmax:   134.50 cm/s LVOT Vmean:  88.400 cm/s LVOT VTI:    0.298 m  AORTA Ao Root diam: 3.00 cm Ao Asc diam:  3.40 cm MITRAL VALVE                TRICUSPID VALVE MV Area (PHT): 3.34 cm     TR Peak grad:   65.3 mmHg MV Area VTI:   2.41 cm     TR Vmax:        404.00 cm/s MV Peak grad:  8.0 mmHg MV Mean grad:  3.0 mmHg     SHUNTS MV Vmax:       1.41 m/s     Systemic VTI:  0.30 m MV Vmean:      72.3 cm/s    Systemic Diam: 2.20 cm MV Decel Time: 227 msec MV E velocity: 112.00 cm/s MV A velocity: 116.50 cm/s MV E/A ratio:  0.96 Rudean Haskell MD Electronically signed by Rudean Haskell MD Signature Date/Time: 03/11/2022/5:20:11 PM    Final     Labs: BMET Recent Labs  Lab 03/10/22 1146 03/10/22 1332 03/10/22 1637 03/11/22 0519 03/12/22 0455 03/13/22 0539  NA 134*  --   --  139 138 140  K 3.7  --   --  4.1 3.6 3.9  CL 99  --   --  107  107 112*  CO2 26  --   --  '23 23 22  '$ GLUCOSE 112*  --   --  142* 124* 104*  BUN 24*  --   --  30* 39* 38*  CREATININE 1.97*  --  1.95* 2.17* 2.03* 1.80*  CALCIUM 9.2  --   --  8.6* 8.3* 8.5*  PHOS  --  4.0  --  5.2* 4.8* 4.1   CBC Recent Labs  Lab 03/10/22 1146 03/11/22 0519 03/12/22 0455 03/13/22 0539  WBC 5.7 3.2* 4.1 5.0  NEUTROABS 4.3 2.6 3.2 3.9  HGB 11.1* 10.0* 10.0* 9.7*  HCT 34.3* 31.2* 31.3* 30.1*  MCV 88.9 90.4 91.8 90.9  PLT 243 232 231 220    Medications:     albuterol  2 puff Inhalation BID   allopurinol  100 mg Oral BID   amLODipine  10 mg Oral Daily   vitamin C  500 mg Oral Daily   dexamethasone  6 mg Oral  Daily   guaiFENesin  600 mg Oral BID   heparin  5,000 Units Subcutaneous Q8H   hydrALAZINE  100 mg Oral Q8H   insulin aspart  0-15 Units Subcutaneous TID WC   insulin aspart  0-5 Units Subcutaneous QHS   levETIRAcetam  500 mg Oral Daily   metoprolol tartrate  100 mg Oral BID   mirtazapine  15 mg Oral QHS   molnupiravir EUA  4 capsule Oral BID   QUEtiapine  25 mg Oral QHS   sodium chloride flush  3 mL Intravenous Q12H   zinc sulfate  220 mg Oral Daily      Gean Quint, MD [facility] 03/13/2022, 1:09 PM

## 2022-03-13 NOTE — Progress Notes (Addendum)
Physical Therapy Treatment Patient Details Name: [name] MRN: [id number] DOB: 05/22/1929 Today's Date: 03/13/2022   SATURATION QUALIFICATIONS: (This note is used to comply with regulatory documentation for home oxygen)  Patient Saturations on Room Air at Rest = 94%  Patient Saturations on Room Air while Ambulating = 90%  Patient Saturations on 2 Liters of oxygen while Ambulating = 98%    History of Present Illness Patient is a 88 year old female who was admitted with COIVD 19 pneumonia. PMH: dementia, Dm, CKD    PT Comments    Pt tolerated activity well. Would recommend continued assessment of ambulatory O2 sats up until discharge. Will continue to follow. Pt uses rollator PRN (when wearing high heels per daughter). Otherwise, she does not use an assistive device.    Recommendations for follow up therapy are one component of a multi-disciplinary discharge planning process, led by the attending physician.  Recommendations may be updated based on patient status, additional functional criteria and insurance authorization.  Follow Up Recommendations  Home health PT     Assistance Recommended at Discharge PRN  Patient can return home with the following A little help with walking and/or transfers;Assist for transportation;Assistance with cooking/housework;Help with stairs or ramp for entrance   Equipment Recommendations  None recommended by PT    Recommendations for Other Services       Precautions / Restrictions Precautions Precautions: Fall Precaution Comments: monitor O2 Restrictions Weight Bearing Restrictions: No     Mobility  Bed Mobility               General bed mobility comments: oob in recliner    Transfers Overall transfer level: Needs assistance Equipment used: Rollator (4 wheels), None   Sit to Stand: Supervision           General transfer comment: Supv for safety, lines,proper operation of rollator    Ambulation/Gait Ambulation/Gait  assistance: Supervision Gait Distance (Feet): 900 Feet Assistive device: Rollator (4 wheels) Gait Pattern/deviations: Step-through pattern       General Gait Details: O2 90% on RA, 98% on 2L. Pt denied dyspnea.   Stairs             Wheelchair Mobility    Modified Rankin (Stroke Patients Only)       Balance Overall balance assessment: Mild deficits observed, not formally tested                                          Cognition Arousal/Alertness: Awake/alert Behavior During Therapy: WFL for tasks assessed/performed Overall Cognitive Status: History of cognitive impairments - at baseline                                 General Comments: patient follows commands; HOH        Exercises      General Comments        Pertinent Vitals/Pain Pain Assessment Pain Assessment: No/denies pain    Home Living                          Prior Function            PT Goals (current goals can now be found in the care plan section) Progress towards PT goals: Progressing toward goals    Frequency  Min 3X/week      PT Plan Current plan remains appropriate    Co-evaluation              AM-PAC PT "6 Clicks" Mobility   Outcome Measure  Help needed turning from your back to your side while in a flat bed without using bedrails?: None Help needed moving from lying on your back to sitting on the side of a flat bed without using bedrails?: None Help needed moving to and from a bed to a chair (including a wheelchair)?: None Help needed standing up from a chair using your arms (e.g., wheelchair or bedside chair)?: None Help needed to walk in hospital room?: A Little Help needed climbing 3-5 steps with a railing? : A Little 6 Click Score: 22    End of Session Equipment Utilized During Treatment: Oxygen Activity Tolerance: Patient tolerated treatment well Patient left: in chair;with call bell/phone within reach;with  family/visitor present;with chair alarm set   PT Visit Diagnosis: Difficulty in walking, not elsewhere classified (R26.2)     Time: 5427-0623 PT Time Calculation (min) (ACUTE ONLY): 25 min  Charges:  $Gait Training: 23-37 mins                         Doreatha Massed, PT Acute Rehabilitation  Office: [phone] Pager: [phone]

## 2022-03-13 NOTE — Progress Notes (Signed)
PROGRESS NOTE    Laura RITACCO  ZOX:[id number] DOB: 1928/09/13 DOA: 03/10/2022 PCP: Seward Carol, MD   Brief Narrative: 88 year old with past medical history significant for dementia, diabetes, GERD, hypertension, CKD stage IV, presents with generalized weakness body ache, cough and shortness of breath, with sick contact with COVID found to have Covid  Pneumonia, AKI on CKD stage IV and acute on chronic diastolic heart failure exacerbation.  He was a started on molnupiravir, IV ceftriaxone and azithromycin.   Assessment & Plan:   Principal Problem:   Pneumonia due to COVID-19 virus Active Problems:   UTI (urinary tract infection)   Dementia ([place])   AKI (acute kidney injury) ([place])   Type 2 diabetes mellitus with complication, without long-term current use of insulin (HCC)   Glaucoma   CKD (chronic kidney disease), stage IV ([place])  1-Pneumonia secondary to COVID-19, Acute Hypoxic Respiratory failure.  Patient presented with shortness of breath, cough,  oxygen saturation 92% on room air, she was placed on 2 L of oxygen.  COVID PCR positive.  She has a sick contact daughter recently diagnosed with COVID as well. -Continue with IV ceftriaxone and azithromycin to cover for superimposed pneumonia, day 4/5. -Oxygen sat on Room Air was at 85, stable on 2-L oxygen.  -Patient was a started on molnupiravir continue she will need 5 days course. Day 3.  -Continue with oxygen supplementation and nebulizer -Continue with oral Decadron 6 mg daily for 10 days. -on guaifenesin Plan to check oxygen on ambulation, might require oxygen at home.    2-AKI on CKD stage 3 b-4.  Prior creatinine per records 1.6 Lab work from PCP office GFR 29 cr 1.02 November 2021.  Creatinine on admission 1.9--2.1 Nephrology consulted, received IV fluids. AKI could have be multifactorial; dehydration , UTI, Covid.  Cr decreased today to 1.8.  Resume lasix at discharge per nephrology    3-Dementia, Depression;  Resume  Mirtazapine, Seroquel.    Acute on chronic Diastolic Heart failure exacerbation/  Presents with dyspnea, elevated BNP, Positive JVD.  Received 20 mg IV lasix  She takes 40 mg oral lasix.  Nephrologist to assist with diuretics. Plan to hold diuretics for now, resume at discharge.  ECHO: Normal EF, Grade 2 diastolic dysfunction.   Diabetes type 2; SSI. Hold oral meds.  Might need long acting if cbg increases.    History of seizure: Continue with Keppra   Mild hyponatremia: Monitor.    UTI; Pyuria;  UA with 50 WBC.  Follow urine culture. Growing 100,000 Klebsiella pneumonia , sensitive to ceftriaxone. Continue with ceftriaxone.  Blood culture no growth to date.   Elevated D dimer in setting Covid. DVT prophylaxis. D dimer trending down.  HTN; Continue with hydralazine and norvasc.     Estimated body mass index is 29.63 kg/m as calculated from the following:   Height as of this encounter: '5\' 2"'$  (1.575 m).   Weight as of this encounter: 73.5 kg.   DVT prophylaxis: Heparin  Code Status: Full code Family Communication: Daughter over phone 8/14 Disposition Plan:  Status is: Inpatient Remains inpatient appropriate because: management of Covid PNA    Consultants:  Nephrology   Procedures:  ECHO  Antimicrobials:    Subjective: Patient report SOB at times, over all feels better. She is feeling weak.  Daughter relates she is working on getting caregiver for discharge  home.  Objective: Vitals:   03/13/22 0614 03/13/22 0748 03/13/22 1028 03/13/22 1124  BP: (!) 168/66   (!) 174/53  Pulse: (!) 56  66 (!) 59  Resp: 16   18  Temp: (!) 97.5 F (36.4 C)   98.1 F (36.7 C)  TempSrc: Oral     SpO2: 95% 98%  99%  Weight:      Height:        Intake/Output Summary (Last 24 hours) at 03/13/2022 1338 Last data filed at 03/13/2022 0836 Gross per 24 hour  Intake 240 ml  Output --  Net 240 ml    Filed Weights   03/10/22 1025  Weight: 73.5 kg     Examination:  General exam: NAD Respiratory system: CTA Cardiovascular system: S 1, S 2 RRR Gastrointestinal system: BS present, soft nt Central nervous system: Non focal Extremities: no edema    Data Reviewed: I have personally reviewed following labs and imaging studies  CBC: Recent Labs  Lab 03/10/22 1146 03/11/22 0519 03/12/22 0455 03/13/22 0539  WBC 5.7 3.2* 4.1 5.0  NEUTROABS 4.3 2.6 3.2 3.9  HGB 11.1* 10.0* 10.0* 9.7*  HCT 34.3* 31.2* 31.3* 30.1*  MCV 88.9 90.4 91.8 90.9  PLT 243 232 231 947    Basic Metabolic Panel: Recent Labs  Lab 03/10/22 1146 03/10/22 1332 03/10/22 1637 03/11/22 0519 03/12/22 0455 03/13/22 0539  NA 134*  --   --  139 138 140  K 3.7  --   --  4.1 3.6 3.9  CL 99  --   --  107 107 112*  CO2 26  --   --  '23 23 22  '$ GLUCOSE 112*  --   --  142* 124* 104*  BUN 24*  --   --  30* 39* 38*  CREATININE 1.97*  --  1.95* 2.17* 2.03* 1.80*  CALCIUM 9.2  --   --  8.6* 8.3* 8.5*  MG  --  1.7  --  1.9 1.9 2.0  PHOS  --  4.0  --  5.2* 4.8* 4.1    GFR: Estimated Creatinine Clearance: 19.1 mL/min (A) (by C-G formula based on SCr of 1.8 mg/dL (H)). Liver Function Tests: Recent Labs  Lab 03/11/22 0519 03/12/22 0455 03/13/22 0539  AST '21 20 18  '$ ALT '18 16 15  '$ ALKPHOS 167* 157* 146*  BILITOT 0.2* 0.2* 0.2*  PROT 7.1 7.2 6.8  ALBUMIN 3.0* 3.1* 2.9*    No results for input(s): "LIPASE", "AMYLASE" in the last 168 hours. No results for input(s): "AMMONIA" in the last 168 hours. Coagulation Profile: No results for input(s): "INR", "PROTIME" in the last 168 hours. Cardiac Enzymes: No results for input(s): "CKTOTAL", "CKMB", "CKMBINDEX", "TROPONINI" in the last 168 hours. BNP (last 3 results) No results for input(s): "PROBNP" in the last 8760 hours. HbA1C: Recent Labs    03/10/22 1652  HGBA1C 6.4*    CBG: Recent Labs  Lab 03/12/22 1159 03/12/22 1658 03/12/22 2042 03/13/22 0743 03/13/22 1121  GLUCAP 117* 150* 188* 102* 122*     Lipid Profile: No results for input(s): "CHOL", "HDL", "LDLCALC", "TRIG", "CHOLHDL", "LDLDIRECT" in the last 72 hours. Thyroid Function Tests: No results for input(s): "TSH", "T4TOTAL", "FREET4", "T3FREE", "THYROIDAB" in the last 72 hours. Anemia Panel: Recent Labs    03/11/22 0519 03/12/22 0455  FERRITIN 70 69    Sepsis Labs: Recent Labs  Lab 03/10/22 1332  PROCALCITON <0.10     Recent Results (from the past 240 hour(s))  SARS Coronavirus 2 by RT PCR (hospital order, performed in [facility] hospital lab) *cepheid single result test* Anterior Nasal Swab  Status: Abnormal   Collection Time: 03/10/22 10:28 AM   Specimen: Anterior Nasal Swab  Result Value Ref Range Status   SARS Coronavirus 2 by RT PCR POSITIVE (A) NEGATIVE Final    Comment: (NOTE) SARS-CoV-2 target nucleic acids are DETECTED  SARS-CoV-2 RNA is generally detectable in upper respiratory specimens  during the acute phase of infection.  Positive results are indicative  of the presence of the identified virus, but do not rule out bacterial infection or co-infection with other pathogens not detected by the test.  Clinical correlation with patient history and  other diagnostic information is necessary to determine patient infection status.  The expected result is negative.  Fact Sheet for Patients:   https://www.patel.info/   Fact Sheet for Healthcare Providers:   https://hall.com/    This test is not yet approved or cleared by the [place] FDA and  has been authorized for detection and/or diagnosis of SARS-CoV-2 by FDA under an Emergency Use Authorization (EUA).  This EUA will remain in effect (meaning this test can be used) for the duration of  the COVID-19 declaration under Section 564(b)(1)  of the Act, 21 U.S.C. section 360-bbb-3(b)(1), unless the authorization is terminated or revoked sooner.   Performed at [facility], [address] [place], [place] [address]   Urine Culture     Status: Abnormal   Collection Time: 03/10/22  1:25 PM   Specimen: Urine, Clean Catch  Result Value Ref Range Status   Specimen Description   Final    URINE, CLEAN CATCH Performed at [facility], [address] [place], [place] [address]    Special Requests   Final    NONE Performed at [facility], [address] [place], [place] [address]    Culture >=100,000 COLONIES/mL KLEBSIELLA PNEUMONIAE (A)  Final   Report Status 03/12/2022 FINAL  Final   Organism ID, Bacteria KLEBSIELLA PNEUMONIAE (A)  Final      Susceptibility   Klebsiella pneumoniae - MIC*    AMPICILLIN RESISTANT Resistant     CEFAZOLIN <=4 SENSITIVE Sensitive     CEFEPIME <=0.12 SENSITIVE Sensitive     CEFTRIAXONE <=0.25 SENSITIVE Sensitive     CIPROFLOXACIN <=0.25 SENSITIVE Sensitive     GENTAMICIN <=1 SENSITIVE Sensitive     IMIPENEM <=0.25 SENSITIVE Sensitive     NITROFURANTOIN 64 INTERMEDIATE Intermediate     TRIMETH/SULFA <=20 SENSITIVE Sensitive     AMPICILLIN/SULBACTAM 4 SENSITIVE Sensitive     PIP/TAZO <=4 SENSITIVE Sensitive     * >=100,000 COLONIES/mL KLEBSIELLA PNEUMONIAE  Culture, blood (routine x 2) Call MD if unable to obtain prior to antibiotics being given     Status: None (Preliminary result)   Collection Time: 03/10/22  1:55 PM   Specimen: BLOOD  Result Value Ref Range Status   Specimen Description   Final    BLOOD LEFT ANTECUBITAL BOTTLES DRAWN AEROBIC AND ANAEROBIC Blood Culture adequate volume Performed at [facility], [address], [place], [place] [address]    Special Requests   Final    NONE Performed at [facility], [address] [place], [place] [address]    Culture   Final    NO GROWTH 3 DAYS Performed at [facility], [address], [place], [place] [address]    Report Status PENDING  Incomplete  Culture, blood (routine x 2)  Call MD if unable to obtain prior to antibiotics being given     Status: None (Preliminary result)  Collection Time: 03/10/22  2:00 PM   Specimen: BLOOD RIGHT HAND  Result Value Ref Range Status   Specimen Description   Final    BLOOD RIGHT HAND BOTTLES DRAWN AEROBIC AND ANAEROBIC Blood Culture adequate volume Performed at [facility], [address], [place], [place] [address]    Special Requests   Final    NONE Performed at [facility], [address] [place], [place] [address]    Culture   Final    NO GROWTH 3 DAYS Performed at [facility], [place] [address], [place], [place] [address]    Report Status PENDING  Incomplete         Radiology Studies: US RENAL  Result Date: 03/11/2022 CLINICAL DATA:  Acute on chronic kidney disease. EXAM: RENAL / URINARY TRACT ULTRASOUND COMPLETE COMPARISON:  None Available. FINDINGS: Right Kidney: Renal measurements: 10.2 x 3.8 x 4.6 = volume: 93.9 mL. Echogenicity is increased. No hydronephrosis. There are 3 cysts in the right kidney. The largest is in the mid kidney measuring 1.2 x 1.1 x 1.2 cm. Left Kidney: Renal measurements: 10.8 x 5.4 x 5.0 cm = volume: 152 mL. Echogenicity is increased. No hydronephrosis. There is an inferior pole cyst measuring 5.3 x 4.3 x 5.3 cm. Bladder: Appears normal for degree of bladder distention. Other: None. IMPRESSION: 1. Echogenic kidneys likely related to medical renal disease. 2. No hydronephrosis. 3. Bilateral renal cysts. Electronically Signed   By: Ronney Asters M.D.   On: 03/11/2022 21:33   ECHOCARDIOGRAM COMPLETE  Result Date: 03/11/2022    ECHOCARDIOGRAM REPORT   Patient Name:   RICQUEL FOULK Date of Exam: 03/11/2022 Medical Rec #:  [id number]  Height:       62.0 in Accession #:    [id number] Weight:       162.0 lb Date of Birth:  05/27/1929  BSA:          1.748 m Patient Age:    49 years   BP:           147/52 mmHg Patient Gender: F          HR:           65 bpm.  Exam Location:  Inpatient Procedure: 2D Echo, 3D Echo, Cardiac Doppler and Color Doppler Indications:    R06.02 SOB  History:        Patient has no prior history of Echocardiogram examinations.                 Signs/Symptoms:Alzheimer's; Risk Factors:Diabetes and                 Hypertension. Covid positive.  Sonographer:    Roseanna Rainbow RDCS Referring Phys: [id number] Davidmichael Zarazua A Elmore Hyslop IMPRESSIONS  1. Left ventricular ejection fraction, by estimation, is 60 to 65%. Left ventricular ejection fraction by 3D volume is 60 %. The left ventricle has normal function. The left ventricle has no regional wall motion abnormalities. There is moderate asymmetric left ventricular hypertrophy of the septal segment. Left ventricular diastolic parameters are consistent with Grade II diastolic dysfunction (pseudonormalization). There is the interventricular septum is flattened in diastole ('D' shaped left ventricle), consistent with right ventricular volume overload.  2. Right ventricular systolic function is normal. The right ventricular size is mildly enlarged. There is severely elevated pulmonary artery systolic pressure. The estimated right ventricular systolic pressure is 09.3 mmHg.  3. Left atrial size was moderately dilated.  4. The mitral valve is normal in  structure. No evidence of mitral valve regurgitation.  5. Tricuspid valve regurgitation is mild to moderate.  6. The aortic valve is tricuspid. There is mild calcification of the aortic valve. Aortic valve regurgitation is not visualized. No aortic stenosis is present.  7. The inferior vena cava is dilated in size with <50% respiratory variability, suggesting right atrial pressure of 15 mmHg.  8. Cannot exclude a small PFO. Comparison(s): No prior Echocardiogram. FINDINGS  Left Ventricle: Left ventricular ejection fraction, by estimation, is 60 to 65%. Left ventricular ejection fraction by 3D volume is 60 %. The left ventricle has normal function. The left ventricle has no  regional wall motion abnormalities. The left ventricular internal cavity size was normal in size. There is moderate asymmetric left ventricular hypertrophy of the septal segment. The interventricular septum is flattened in diastole ('D' shaped left ventricle), consistent with right ventricular volume overload. Left ventricular diastolic parameters are consistent with Grade II diastolic dysfunction (pseudonormalization). Right Ventricle: The right ventricular size is mildly enlarged. No increase in right ventricular wall thickness. Right ventricular systolic function is normal. There is severely elevated pulmonary artery systolic pressure. The tricuspid regurgitant velocity is 4.04 m/s, and with an assumed right atrial pressure of 15 mmHg, the estimated right ventricular systolic pressure is 25.0 mmHg. Left Atrium: Left atrial size was moderately dilated. Right Atrium: Right atrial size was normal in size. Pericardium: There is no evidence of pericardial effusion. Mitral Valve: The mitral valve is normal in structure. No evidence of mitral valve regurgitation. MV peak gradient, 8.0 mmHg. The mean mitral valve gradient is 3.0 mmHg. Tricuspid Valve: The tricuspid valve is normal in structure. Tricuspid valve regurgitation is mild to moderate. No evidence of tricuspid stenosis. Aortic Valve: The aortic valve is tricuspid. There is mild calcification of the aortic valve. There is mild aortic valve annular calcification. Aortic valve regurgitation is not visualized. No aortic stenosis is present. Pulmonic Valve: The pulmonic valve was normal in structure. Pulmonic valve regurgitation is not visualized. No evidence of pulmonic stenosis. Aorta: The aortic root and ascending aorta are structurally normal, with no evidence of dilitation. Venous: The inferior vena cava is dilated in size with less than 50% respiratory variability, suggesting right atrial pressure of 15 mmHg. IAS/Shunts: Cannot exclude a small PFO.  LEFT  VENTRICLE PLAX 2D LVIDd:         4.05 cm         Diastology LVIDs:         2.50 cm         LV e' medial:    4.46 cm/s LV PW:         1.15 cm         LV E/e' medial:  25.1 LV IVS:        1.35 cm         LV e' lateral:   5.98 cm/s LVOT diam:     2.20 cm         LV E/e' lateral: 18.7 LV SV:         113 LV SV Index:   65 LVOT Area:     3.80 cm        3D Volume EF                                LV 3D EF:    Left  ventricul LV Volumes (MOD)                            ar LV vol d, MOD    73.0 ml                    ejection A2C:                                        fraction LV vol d, MOD    76.1 ml                    by 3D A4C:                                        volume is LV vol s, MOD    25.9 ml                    60 %. A2C: LV vol s, MOD    22.4 ml A4C:                           3D Volume EF: LV SV MOD A2C:   47.1 ml       3D EF:        60 % LV SV MOD A4C:   76.1 ml       LV EDV:       95 ml LV SV MOD BP:    49.4 ml       LV ESV:       38 ml                                LV SV:        58 ml RIGHT VENTRICLE            IVC RV S prime:     8.38 cm/s  IVC diam: 2.30 cm TAPSE (M-mode): 2.8 cm LEFT ATRIUM             Index        RIGHT ATRIUM           Index LA diam:        3.60 cm 2.06 cm/m   RA Area:     18.20 cm LA Vol (A2C):   61.8 ml 35.36 ml/m  RA Volume:   53.10 ml  30.38 ml/m LA Vol (A4C):   78.9 ml 45.14 ml/m LA Biplane Vol: 72.3 ml 41.36 ml/m  AORTIC VALVE LVOT Vmax:   134.50 cm/s LVOT Vmean:  88.400 cm/s LVOT VTI:    0.298 m  AORTA Ao Root diam: 3.00 cm Ao Asc diam:  3.40 cm MITRAL VALVE                TRICUSPID VALVE MV Area (PHT): 3.34 cm     TR Peak grad:   65.3 mmHg MV Area VTI:   2.41 cm     TR Vmax:        404.00 cm/s MV Peak grad:  8.0 mmHg MV Mean grad:  3.0 mmHg     SHUNTS MV Vmax:  1.41 m/s     Systemic VTI:  0.30 m MV Vmean:      72.3 cm/s    Systemic Diam: 2.20 cm MV Decel Time: 227 msec MV E velocity: 112.00 cm/s MV A velocity: 116.50 cm/s  MV E/A ratio:  0.96 Rudean Haskell MD Electronically signed by Rudean Haskell MD Signature Date/Time: 03/11/2022/5:20:11 PM    Final         Scheduled Meds:  albuterol  2 puff Inhalation BID   allopurinol  100 mg Oral BID   amLODipine  10 mg Oral Daily   vitamin C  500 mg Oral Daily   dexamethasone  6 mg Oral Daily   guaiFENesin  600 mg Oral BID   heparin  5,000 Units Subcutaneous Q8H   hydrALAZINE  100 mg Oral Q8H   insulin aspart  0-15 Units Subcutaneous TID WC   insulin aspart  0-5 Units Subcutaneous QHS   levETIRAcetam  500 mg Oral Daily   metoprolol tartrate  100 mg Oral BID   mirtazapine  15 mg Oral QHS   molnupiravir EUA  4 capsule Oral BID   QUEtiapine  25 mg Oral QHS   sodium chloride flush  3 mL Intravenous Q12H   zinc sulfate  220 mg Oral Daily   Continuous Infusions:  sodium chloride     azithromycin 500 mg (03/12/22 1253)   cefTRIAXone (ROCEPHIN)  IV 2 g (03/12/22 1440)     LOS: 3 days    Time spent: 35 minutes.     Elmarie Shiley, MD [facility]   If 7PM-7AM, please contact night-coverage [email or web address]  03/13/2022, 1:38 PM

## 2022-03-13 NOTE — TOC Progression Note (Signed)
Transition of Care [facility]) - Progression Note    Patient Details  Name: [name] MRN: [id number] Date of Birth: 1929/06/30  [facility]) CM/SW Contact  Cassandria Drew, Juliann Pulse, RN Phone Number: 03/13/2022, 9:49 AM  Clinical Narrative: spoke to dtr Sabrina about [place] agency-she will let me know which [facility] agency she will choose for HHPT.     Expected Discharge Plan: [place] Barriers to Discharge: Continued Medical Work up  Expected Discharge Plan and Services Expected Discharge Plan: [place]                                               Social Determinants of Health (SDOH) Interventions    Readmission Risk Interventions     No data to display

## 2022-03-13 NOTE — TOC Progression Note (Signed)
Transition of Care [facility]) - Progression Note    Patient Details  Name: KEYLI DUROSS MRN: [id number] Date of Birth: [birth year]  Transition of Care [facility]) CM/SW Contact  Trayven Lumadue, Juliann Pulse, RN Phone Number: 03/13/2022, 11:21 AM  Clinical Narrative: [name] dtr chose [facility] for HHPT only rep Britany following.      Expected Discharge Plan: [place] Barriers to Discharge: Continued Medical Work up  Expected Discharge Plan and Services Expected Discharge Plan: [place]: PT [place]: [facility] Date [place]: 03/13/22 Time [place]: 1117 Representative spoke with at [place]: Bryson Corona   Social Determinants of Health ([place]) Interventions    Readmission Risk Interventions     No data to display

## 2022-03-14 DIAGNOSIS — U071 COVID-19: Secondary | ICD-10-CM | POA: Diagnosis not present

## 2022-03-14 DIAGNOSIS — J1282 Pneumonia due to coronavirus disease 2019: Secondary | ICD-10-CM | POA: Diagnosis not present

## 2022-03-14 LAB — COMPREHENSIVE METABOLIC PANEL
ALT: 18 U/L (ref 0–44)
AST: 21 U/L (ref 15–41)
Albumin: 2.8 g/dL — ABNORMAL LOW (ref 3.5–5.0)
Alkaline Phosphatase: 134 U/L — ABNORMAL HIGH (ref 38–126)
Anion gap: 7 (ref 5–15)
BUN: 36 mg/dL — ABNORMAL HIGH (ref 8–23)
CO2: 23 mmol/L (ref 22–32)
Calcium: 8.5 mg/dL — ABNORMAL LOW (ref 8.9–10.3)
Chloride: 111 mmol/L (ref 98–111)
Creatinine, Ser: 1.6 mg/dL — ABNORMAL HIGH (ref 0.44–1.00)
GFR, Estimated: 30 mL/min — ABNORMAL LOW (ref >60)
Glucose, Bld: 93 mg/dL (ref 70–99)
Potassium: 3.8 mmol/L (ref 3.5–5.1)
Sodium: 141 mmol/L (ref 135–145)
Total Bilirubin: 0.3 mg/dL (ref 0.3–1.2)
Total Protein: 6.9 g/dL (ref 6.5–8.1)

## 2022-03-14 LAB — CBC WITH DIFFERENTIAL/PLATELET
Abs Immature Granulocytes: 0.03 10*3/uL (ref 0.00–0.07)
Basophils Absolute: 0 10*3/uL (ref 0.0–0.1)
Basophils Relative: 0 %
Eosinophils Absolute: 0 10*3/uL (ref 0.0–0.5)
Eosinophils Relative: 0 %
HCT: 29.4 % — ABNORMAL LOW (ref 36.0–46.0)
Hemoglobin: 9.5 g/dL — ABNORMAL LOW (ref 12.0–15.0)
Immature Granulocytes: 1 %
Lymphocytes Relative: 16 %
Lymphs Abs: 0.8 10*3/uL (ref 0.7–4.0)
MCH: 29.1 pg (ref 26.0–34.0)
MCHC: 32.3 g/dL (ref 30.0–36.0)
MCV: 90.2 fL (ref 80.0–100.0)
Monocytes Absolute: 0.4 10*3/uL (ref 0.1–1.0)
Monocytes Relative: 8 %
Neutro Abs: 3.8 10*3/uL (ref 1.7–7.7)
Neutrophils Relative %: 75 %
Platelets: 207 10*3/uL (ref 150–400)
RBC: 3.26 MIL/uL — ABNORMAL LOW (ref 3.87–5.11)
RDW: 15 % (ref 11.5–15.5)
WBC: 5 10*3/uL (ref 4.0–10.5)
nRBC: 0 % (ref 0.0–0.2)

## 2022-03-14 LAB — PHOSPHORUS: Phosphorus: 3.5 mg/dL (ref 2.5–4.6)

## 2022-03-14 LAB — C-REACTIVE PROTEIN: CRP: <0.5 mg/dL (ref <1.0)

## 2022-03-14 LAB — GLUCOSE, CAPILLARY
Glucose-Capillary: 83 mg/dL (ref 70–99)
Glucose-Capillary: 114 mg/dL — ABNORMAL HIGH (ref 70–99)
Glucose-Capillary: 208 mg/dL — ABNORMAL HIGH (ref 70–99)
Glucose-Capillary: 161 mg/dL — ABNORMAL HIGH (ref 70–99)

## 2022-03-14 LAB — MAGNESIUM: Magnesium: 1.9 mg/dL (ref 1.7–2.4)

## 2022-03-14 MED ORDER — SODIUM CHLORIDE 0.9 % IV SOLN
2.0000 g | Freq: Once | INTRAVENOUS | Status: AC
  Administered 2022-03-14: 2 g via INTRAVENOUS
  Filled 2022-03-14: qty 20

## 2022-03-14 MED ORDER — AZITHROMYCIN 250 MG PO TABS
500.0000 mg | ORAL_TABLET | Freq: Every day | ORAL | Status: AC
  Administered 2022-03-14 – 2022-03-15 (×2): 500 mg via ORAL
  Filled 2022-03-14 (×2): qty 2

## 2022-03-14 NOTE — Progress Notes (Signed)
Mobility Specialist - Progress Note    03/14/22 0900  Mobility  Activity Transferred from bed to chair  Level of Assistance Standby assist, set-up cues, supervision of patient - no hands on  Assistive Device None  Distance Ambulated (ft) 5 ft  Activity Response Tolerated well  $Mobility charge 1 Mobility   Pt was found in bed and was agreeable to mobilize. Was left in recliner chair with all necessities in reach and chair alarm on.  Ferd Hibbs Mobility Specialist

## 2022-03-14 NOTE — Progress Notes (Signed)
PROGRESS NOTE  [name] ZOX:[id number] DOB: 1928-12-26 DOA: 03/10/2022 PCP: Seward Carol, MD   LOS: 4 days   Brief Narrative / Interim history: 88 year old female with history of dementia, diabetes, HTN, CKD 4 comes into the hospital with generalized weakness, body aches, cough and shortness of breath.  She was found to have COVID-19 and viral pneumonia, acute kidney injury and acute on chronic diastolic CHF.  She was started on antibiotics, antivirals and admitted to the hospital.  Subjective / 24h Interval events: Does not feel too good today.  Complains of a persistent productive cough  Assesement and Plan: Principal Problem:   Pneumonia due to COVID-19 virus Active Problems:   UTI (urinary tract infection)   Dementia (HCC)   AKI (acute kidney injury) ([place])   Type 2 diabetes mellitus with complication, without long-term current use of insulin (HCC)   Glaucoma   CKD (chronic kidney disease), stage IV ([place])   Principal problem Acute hypoxic respiratory failure due to COVID-19 pneumonia-continue supplemental oxygen, attempt to wean off to room air as tolerated.  Continue molnupiravir for total of 5 days, today's day 4/5.  Continue Decadron for total of 10 days.  Keep antibiotics for total of 5 days  COVID-19 Labs  Recent Labs    03/12/22 0455 03/13/22 0539  DDIMER 1.00*  --   FERRITIN 69  --   CRP 0.8 0.5    Lab Results  Component Value Date   SARSCOV2NAA POSITIVE (A) 03/10/2022   [place] NEGATIVE 11/26/2020    Active problems Acute kidney injury on chronic kidney disease stage IV-baseline creatinine around 1.6, it was as high as 2.1 on admission.  Nephrology consulted, received IV fluids and now creatinine has returned to his baseline and is 1.6 this morning.  Dementia, depression-continue home medications  Acute on chronic diastolic CHF-received IV Lasix on admission, this has now been held due to acute kidney injury.  Nephrology recommending to hold  diuretics now and resume at discharge.  Echo was done and showed normal EF and grade 2 diastolic dysfunction.  Essential hypertension-continue hydralazine, Norvasc  Normocytic anemia-likely in the setting of chronic kidney disease.  Hemoglobin stable, no bleeding.  DM2-A1c 6.4.  Continue to monitor sugars while getting steroids  CBG (last 3)  Recent Labs    03/13/22 1640 03/13/22 2129 03/14/22 0741  GLUCAP 186* 163* 83   Lab Results  Component Value Date   HGBA1C 6.4 (H) 03/10/2022    Scheduled Meds:  albuterol  2 puff Inhalation BID   allopurinol  100 mg Oral BID   amLODipine  10 mg Oral Daily   vitamin C  500 mg Oral Daily   dexamethasone  6 mg Oral Daily   guaiFENesin  600 mg Oral BID   heparin  5,000 Units Subcutaneous Q8H   hydrALAZINE  100 mg Oral Q8H   insulin aspart  0-15 Units Subcutaneous TID WC   insulin aspart  0-5 Units Subcutaneous QHS   levETIRAcetam  500 mg Oral Daily   metoprolol tartrate  100 mg Oral BID   mirtazapine  15 mg Oral QHS   molnupiravir EUA  4 capsule Oral BID   QUEtiapine  25 mg Oral QHS   sodium chloride flush  3 mL Intravenous Q12H   zinc sulfate  220 mg Oral Daily   Continuous Infusions:  sodium chloride     azithromycin 500 mg (03/13/22 1533)   cefTRIAXone (ROCEPHIN)  IV 2 g (03/13/22 1415)   PRN Meds:.sodium chloride, acetaminophen **  OR** acetaminophen, guaiFENesin-dextromethorphan, ondansetron **OR** ondansetron (ZOFRAN) IV, senna-docusate, sodium chloride flush  Diet Orders (From admission, onward)     Start     Ordered   03/10/22 1812  Diet heart healthy/carb modified Room service appropriate? Yes; Fluid consistency: Thin  Diet effective now       Question Answer Comment  Diet-HS Snack? Nothing   Room service appropriate? Yes   Fluid consistency: Thin      03/10/22 1812            DVT prophylaxis: heparin injection 5,000 Units Start: 03/10/22 1715 SCDs Start: 03/10/22 1624   Lab Results  Component Value Date    PLT 207 03/14/2022      Code Status: Full Code  Family Communication: daughter over the phone  Status is: Inpatient Remains inpatient appropriate because: persistent symptoms, hypoxemia   Level of care: Progressive  Consultants:  none  Objective: Vitals:   03/13/22 2022 03/13/22 2130 03/14/22 0627 03/14/22 0756  BP:   (!) 159/53   Pulse:  60 (!) 56   Resp:      Temp:   97.8 F (36.6 C)   TempSrc:   Oral   SpO2: 98%  100% 97%  Weight:      Height:        Intake/Output Summary (Last 24 hours) at 03/14/2022 1009 Last data filed at 03/13/2022 1633 Gross per 24 hour  Intake 480 ml  Output --  Net 480 ml   Wt Readings from Last 3 Encounters:  03/10/22 73.5 kg  12/20/20 77.7 kg  12/08/20 76.7 kg    Examination:  Constitutional: NAD Eyes: no scleral icterus ENMT: Mucous membranes are moist.  Neck: normal, supple Respiratory: Diminished at the bases but overall clear without wheezing Cardiovascular: Regular rate and rhythm, no murmurs / rubs / gallops.  Abdomen: non distended, no tenderness. Bowel sounds positive.  Musculoskeletal: no clubbing / cyanosis.  Skin: no rashes Neurologic: non focal   Data Reviewed: I have independently reviewed following labs and imaging studies   CBC Recent Labs  Lab 03/10/22 1146 03/11/22 0519 03/12/22 0455 03/13/22 0539 03/14/22 0617  WBC 5.7 3.2* 4.1 5.0 5.0  HGB 11.1* 10.0* 10.0* 9.7* 9.5*  HCT 34.3* 31.2* 31.3* 30.1* 29.4*  PLT 243 232 231 220 207  MCV 88.9 90.4 91.8 90.9 90.2  MCH 28.8 29.0 29.3 29.3 29.1  MCHC 32.4 32.1 31.9 32.2 32.3  RDW 15.1 15.2 15.1 15.3 15.0  LYMPHSABS 0.6* 0.5* 0.6* 0.7 0.8  MONOABS 0.7 0.1 0.3 0.3 0.4  EOSABS 0.1 0.0 0.0 0.0 0.0  BASOSABS 0.0 0.0 0.0 0.0 0.0    Recent Labs  Lab 03/10/22 1146 03/10/22 1332 03/10/22 1637 03/10/22 1652 03/11/22 0519 03/12/22 0455 03/13/22 0539 03/14/22 0617  NA 134*  --   --   --  139 138 140 141  K 3.7  --   --   --  4.1 3.6 3.9 3.8  CL 99   --   --   --  107 107 112* 111  CO2 26  --   --   --  '23 23 22 23  '$ GLUCOSE 112*  --   --   --  142* 124* 104* 93  BUN 24*  --   --   --  30* 39* 38* 36*  CREATININE 1.97*  --  1.95*  --  2.17* 2.03* 1.80* 1.60*  CALCIUM 9.2  --   --   --  8.6* 8.3* 8.5* 8.5*  AST  --   --   --   --  '21 20 18 21  '$ ALT  --   --   --   --  '18 16 15 18  '$ ALKPHOS  --   --   --   --  167* 157* 146* 134*  BILITOT  --   --   --   --  0.2* 0.2* 0.2* 0.3  ALBUMIN  --   --   --   --  3.0* 3.1* 2.9* 2.8*  MG  --  1.7  --   --  1.9 1.9 2.0 1.9  CRP  --  1.6*  --   --  1.8* 0.8 0.5  --   DDIMER  --  1.93*  --   --  1.65* 1.00*  --   --   PROCALCITON  --  <0.10  --   --   --   --   --   --   HGBA1C  --   --   --  6.4*  --   --   --   --   BNP  --  1,131.3*  --   --   --   --   --   --     ------------------------------------------------------------------------------------------------------------------ No results for input(s): "CHOL", "HDL", "LDLCALC", "TRIG", "CHOLHDL", "LDLDIRECT" in the last 72 hours.  Lab Results  Component Value Date   HGBA1C 6.4 (H) 03/10/2022   ------------------------------------------------------------------------------------------------------------------ No results for input(s): "TSH", "T4TOTAL", "T3FREE", "THYROIDAB" in the last 72 hours.  Invalid input(s): "FREET3"  Cardiac Enzymes No results for input(s): "CKMB", "TROPONINI", "MYOGLOBIN" in the last 168 hours.  Invalid input(s): "CK" ------------------------------------------------------------------------------------------------------------------    Component Value Date/Time   BNP 1,131.3 (H) 03/10/2022 1332    CBG: Recent Labs  Lab 03/13/22 0743 03/13/22 1121 03/13/22 1640 03/13/22 2129 03/14/22 0741  GLUCAP 102* 122* 186* 163* 83    Recent Results (from the past 240 hour(s))  SARS Coronavirus 2 by RT PCR (hospital order, performed in [facility] hospital lab) *cepheid single result test* Anterior Nasal Swab      Status: Abnormal   Collection Time: 03/10/22 10:28 AM   Specimen: Anterior Nasal Swab  Result Value Ref Range Status   SARS Coronavirus 2 by RT PCR POSITIVE (A) NEGATIVE Final    Comment: (NOTE) SARS-CoV-2 target nucleic acids are DETECTED  SARS-CoV-2 RNA is generally detectable in upper respiratory specimens  during the acute phase of infection.  Positive results are indicative  of the presence of the identified virus, but do not rule out bacterial infection or co-infection with other pathogens not detected by the test.  Clinical correlation with patient history and  other diagnostic information is necessary to determine patient infection status.  The expected result is negative.  Fact Sheet for Patients:   https://www.patel.info/   Fact Sheet for Healthcare Providers:   https://hall.com/    This test is not yet approved or cleared by the [place] FDA and  has been authorized for detection and/or diagnosis of SARS-CoV-2 by FDA under an Emergency Use Authorization (EUA).  This EUA will remain in effect (meaning this test can be used) for the duration of  the COVID-19 declaration under Section 564(b)(1)  of the Act, 21 U.S.C. section 360-bbb-3(b)(1), unless the authorization is terminated or revoked sooner.   Performed at [facility], [address], [place], [place] [address]   Urine Culture     Status: Abnormal   Collection Time: 03/10/22  1:25 PM   Specimen: Urine, Clean Catch  Result Value Ref Range Status   Specimen Description   Final    URINE, CLEAN CATCH Performed at [facility], [address], [place], [place] [address]    Special Requests   Final    NONE Performed at [facility], [address] [place], [place] [address]    Culture >=100,000 COLONIES/mL KLEBSIELLA PNEUMONIAE (A)  Final   Report Status 03/12/2022 FINAL  Final   Organism ID, Bacteria  KLEBSIELLA PNEUMONIAE (A)  Final      Susceptibility   Klebsiella pneumoniae - MIC*    AMPICILLIN RESISTANT Resistant     CEFAZOLIN <=4 SENSITIVE Sensitive     CEFEPIME <=0.12 SENSITIVE Sensitive     CEFTRIAXONE <=0.25 SENSITIVE Sensitive     CIPROFLOXACIN <=0.25 SENSITIVE Sensitive     GENTAMICIN <=1 SENSITIVE Sensitive     IMIPENEM <=0.25 SENSITIVE Sensitive     NITROFURANTOIN 64 INTERMEDIATE Intermediate     TRIMETH/SULFA <=20 SENSITIVE Sensitive     AMPICILLIN/SULBACTAM 4 SENSITIVE Sensitive     PIP/TAZO <=4 SENSITIVE Sensitive     * >=100,000 COLONIES/mL KLEBSIELLA PNEUMONIAE  Culture, blood (routine x 2) Call MD if unable to obtain prior to antibiotics being given     Status: None (Preliminary result)   Collection Time: 03/10/22  1:55 PM   Specimen: BLOOD  Result Value Ref Range Status   Specimen Description   Final    BLOOD LEFT ANTECUBITAL BOTTLES DRAWN AEROBIC AND ANAEROBIC Blood Culture adequate volume Performed at [facility], [address], [place], [place] [address]    Special Requests   Final    NONE Performed at [facility], [address], [place], [place] [address]    Culture   Final    NO GROWTH 4 DAYS Performed at [facility], [place] [address] [place], [place] [address]    Report Status PENDING  Incomplete  Culture, blood (routine x 2) Call MD if unable to obtain prior to antibiotics being given     Status: None (Preliminary result)   Collection Time: 03/10/22  2:00 PM   Specimen: BLOOD RIGHT HAND  Result Value Ref Range Status   Specimen Description   Final    BLOOD RIGHT HAND BOTTLES DRAWN AEROBIC AND ANAEROBIC Blood Culture adequate volume Performed at [facility], [address], [place], [place] [address]    Special Requests   Final    NONE Performed at [facility], [address] [place], [place] [address]    Culture   Final    NO GROWTH 4 DAYS Performed  at [facility], [place] [address] [place], [place] [address]    Report Status PENDING  Incomplete     Radiology Studies: No results found.  Marzetta Board, MD, PhD [facility]  Between 7 am - 7 pm I am available, please contact me via Amion (for emergencies) or Securechat (non urgent messages)  Between 7 pm - 7 am I am not available, please contact night coverage MD/APP via Amion

## 2022-03-15 DIAGNOSIS — J1282 Pneumonia due to coronavirus disease 2019: Secondary | ICD-10-CM | POA: Diagnosis not present

## 2022-03-15 DIAGNOSIS — U071 COVID-19: Secondary | ICD-10-CM | POA: Diagnosis not present

## 2022-03-15 LAB — COMPREHENSIVE METABOLIC PANEL
ALT: 18 U/L (ref 0–44)
AST: 17 U/L (ref 15–41)
Albumin: 2.8 g/dL — ABNORMAL LOW (ref 3.5–5.0)
Alkaline Phosphatase: 134 U/L — ABNORMAL HIGH (ref 38–126)
Anion gap: 6 (ref 5–15)
BUN: 31 mg/dL — ABNORMAL HIGH (ref 8–23)
CO2: 24 mmol/L (ref 22–32)
Calcium: 8.7 mg/dL — ABNORMAL LOW (ref 8.9–10.3)
Chloride: 109 mmol/L (ref 98–111)
Creatinine, Ser: 1.47 mg/dL — ABNORMAL HIGH (ref 0.44–1.00)
GFR, Estimated: 33 mL/min — ABNORMAL LOW (ref >60)
Glucose, Bld: 89 mg/dL (ref 70–99)
Potassium: 3.6 mmol/L (ref 3.5–5.1)
Sodium: 139 mmol/L (ref 135–145)
Total Bilirubin: 0.3 mg/dL (ref 0.3–1.2)
Total Protein: 6.8 g/dL (ref 6.5–8.1)

## 2022-03-15 LAB — CULTURE, BLOOD (ROUTINE X 2)
Culture: NO GROWTH
Culture: NO GROWTH

## 2022-03-15 LAB — CBC WITH DIFFERENTIAL/PLATELET
Abs Immature Granulocytes: 0.05 10*3/uL (ref 0.00–0.07)
Basophils Absolute: 0 10*3/uL (ref 0.0–0.1)
Basophils Relative: 0 %
Eosinophils Absolute: 0 10*3/uL (ref 0.0–0.5)
Eosinophils Relative: 0 %
HCT: 30.1 % — ABNORMAL LOW (ref 36.0–46.0)
Hemoglobin: 9.8 g/dL — ABNORMAL LOW (ref 12.0–15.0)
Immature Granulocytes: 1 %
Lymphocytes Relative: 18 %
Lymphs Abs: 1 10*3/uL (ref 0.7–4.0)
MCH: 29.3 pg (ref 26.0–34.0)
MCHC: 32.6 g/dL (ref 30.0–36.0)
MCV: 90.1 fL (ref 80.0–100.0)
Monocytes Absolute: 0.4 10*3/uL (ref 0.1–1.0)
Monocytes Relative: 8 %
Neutro Abs: 4.1 10*3/uL (ref 1.7–7.7)
Neutrophils Relative %: 73 %
Platelets: 209 10*3/uL (ref 150–400)
RBC: 3.34 MIL/uL — ABNORMAL LOW (ref 3.87–5.11)
RDW: 14.7 % (ref 11.5–15.5)
WBC: 5.5 10*3/uL (ref 4.0–10.5)
nRBC: 0 % (ref 0.0–0.2)

## 2022-03-15 LAB — GLUCOSE, CAPILLARY
Glucose-Capillary: 95 mg/dL (ref 70–99)
Glucose-Capillary: 114 mg/dL — ABNORMAL HIGH (ref 70–99)
Glucose-Capillary: 223 mg/dL — ABNORMAL HIGH (ref 70–99)
Glucose-Capillary: 140 mg/dL — ABNORMAL HIGH (ref 70–99)

## 2022-03-15 LAB — PHOSPHORUS: Phosphorus: 3.1 mg/dL (ref 2.5–4.6)

## 2022-03-15 LAB — C-REACTIVE PROTEIN: CRP: <0.5 mg/dL (ref <1.0)

## 2022-03-15 LAB — MAGNESIUM: Magnesium: 2 mg/dL (ref 1.7–2.4)

## 2022-03-15 MED ORDER — FUROSEMIDE 40 MG PO TABS
40.0000 mg | ORAL_TABLET | Freq: Every day | ORAL | Status: DC
  Administered 2022-03-15 – 2022-03-16 (×2): 40 mg via ORAL
  Filled 2022-03-15 (×2): qty 1

## 2022-03-15 NOTE — Progress Notes (Addendum)
Pt O2 resting on 1LNC is 99-100%, resting on RA, O2 level is 97-99%, ambulating on RA , Lowest O2 level is 90% . Pt does report Dyspnea with exertion despite levels not dropping.

## 2022-03-15 NOTE — Progress Notes (Signed)
SATURATION QUALIFICATIONS: (This note is used to comply with regulatory documentation for home oxygen)  Patient Saturations on Room Air at Rest = 99-100%  Patient Saturations on Room Air while Ambulating = 90-97%  Patient Saturations on 0 Liters of oxygen while Ambulating = 90%  Please briefly explain why patient needs home oxygen:  Pt sats did not drop below 90% and she did not appear to be in any distress, but she did express dyspnea with exertion.

## 2022-03-15 NOTE — Progress Notes (Signed)
Physical Therapy Treatment Patient Details Name: [name] MRN: [id number] DOB: [birth year] Today's Date: 03/15/2022   History of Present Illness Patient is a 88 year old female who was admitted with COIVD 19 pneumonia. PMH: dementia, Dm, CKD    PT Comments    Patient reports ambulating to BR independently.   SPO2 at rest on Ra 96%, ambulated x 100' , SPO2 90%. Patient unsteady  without a device today.  Patient will benefit from close supervision at Dc.   Recommendations for follow up therapy are one component of a multi-disciplinary discharge planning process, led by the attending physician.  Recommendations may be updated based on patient status, additional functional criteria and insurance authorization.  Follow Up Recommendations  Home health PT     Assistance Recommended at Discharge PRN  Patient can return home with the following A little help with walking and/or transfers;Assist for transportation;Assistance with cooking/housework;Help with stairs or ramp for entrance   Equipment Recommendations  None recommended by PT    Recommendations for Other Services       Precautions / Restrictions Precautions Precautions: Fall Precaution Comments: monitor O2     Mobility  Bed Mobility           Sit to supine: Modified independent (Device/Increase time)   General bed mobility comments: seated  on EOB at entry    Transfers Overall transfer level: Needs assistance Equipment used: None Transfers: Sit to/from Stand Sit to Stand: Supervision           General transfer comment: Supv for safety, lines,    Ambulation/Gait Ambulation/Gait assistance: Supervision Gait Distance (Feet): 100 Feet Assistive device: None, 1 person hand held assist Gait Pattern/deviations: Step-through pattern, Staggering left, Staggering right Gait velocity: decr     General Gait Details: Patient unstedy without device   Stairs             Wheelchair Mobility    Modified  [place] (Stroke Patients Only)       Balance Overall balance assessment: Needs assistance Sitting-balance support: Feet supported, No upper extremity supported Sitting balance-Leahy Scale: Good     Standing balance support: During functional activity, No upper extremity supported Standing balance-Leahy Scale: Poor                              Cognition Arousal/Alertness: Awake/alert Behavior During Therapy: WFL for tasks assessed/performed Overall Cognitive Status: History of cognitive impairments - at baseline                                 General Comments: patient follows commands; HOH        Exercises      General Comments        Pertinent Vitals/Pain Pain Assessment Pain Assessment: No/denies pain    Home Living                          Prior Function            PT Goals (current goals can now be found in the care plan section) Progress towards PT goals: Progressing toward goals    Frequency    Min 3X/week      PT Plan Current plan remains appropriate    Co-evaluation              AM-PAC PT "6 Clicks" Mobility  Outcome Measure  Help needed turning from your back to your side while in a flat bed without using bedrails?: None Help needed moving from lying on your back to sitting on the side of a flat bed without using bedrails?: None Help needed moving to and from a bed to a chair (including a wheelchair)?: None Help needed standing up from a chair using your arms (e.g., wheelchair or bedside chair)?: None Help needed to walk in hospital room?: A Little Help needed climbing 3-5 steps with a railing? : A Little 6 Click Score: 22    End of Session   Activity Tolerance: Patient tolerated treatment well Patient left: in bed;with call bell/phone within reach Nurse Communication: Mobility status PT Visit Diagnosis: Difficulty in walking, not elsewhere classified (R26.2)     Time: 0254-2706 PT Time  Calculation (min) (ACUTE ONLY): 17 min  Charges:  $Gait Training: 8-22 mins                     [place] Office [phone] Weekend pager-[phone]    Claretha Cooper 03/15/2022, 4:34 PM

## 2022-03-15 NOTE — Progress Notes (Signed)
PROGRESS NOTE  [name] WUX:[id number] DOB: 1929-07-30 DOA: 03/10/2022 PCP: Seward Carol, MD   LOS: 5 days   Brief Narrative / Interim history: 88 year old female with history of dementia, diabetes, HTN, CKD 4 comes into the hospital with generalized weakness, body aches, cough and shortness of breath.  She was found to have COVID-19 and viral pneumonia, acute kidney injury and acute on chronic diastolic CHF.  She was started on antibiotics, antivirals and admitted to the hospital.  Subjective / 24h Interval events: Feels better but still weak, continues to have a cough.  Assesement and Plan: Principal Problem:   Pneumonia due to COVID-19 virus Active Problems:   UTI (urinary tract infection)   Dementia ([place])   AKI (acute kidney injury) ([place])   Type 2 diabetes mellitus with complication, without long-term current use of insulin (H[facility])   Glaucoma   CKD (chronic kidney disease), stage IV ([place])   Principal problem Acute hypoxic respiratory failure due to COVID-19 pneumonia-continue supplemental oxygen, attempt to wean off to room air as tolerated.  Finishing molnupiravir as well as antibiotics.  Improving.  Continue Decadron as she is still hypoxic.  Likely needs home oxygen, will determine needs today  COVID-19 Labs  Recent Labs    03/13/22 0539 03/14/22 0617 03/15/22 0734  CRP 0.5 <0.5 <0.5     Lab Results  Component Value Date   SARSCOV2NAA POSITIVE (A) 03/10/2022   [place] NEGATIVE 11/26/2020    Active problems Acute kidney injury on chronic kidney disease stage IV-baseline creatinine around 1.6, it was as high as 2.1 on admission.  Nephrology consulted, received IV fluids and now creatinine has returned to his baseline and improved to 1.47 this morning  Dementia, depression-continue home medications  Acute on chronic diastolic CHF-received IV Lasix on admission, this has now been held due to acute kidney injury.  Nephrology recommending to hold diuretics now  and resume at discharge.  Echo was done and showed normal EF and grade 2 diastolic dysfunction.  Essential hypertension-continue hydralazine, Norvasc  Normocytic anemia-likely in the setting of chronic kidney disease.  Hemoglobin stable, no bleeding.  DM2-A1c 6.4.  Continue to monitor sugars while getting steroids, controlled for most part as below  CBG (last 3)  Recent Labs    03/14/22 1617 03/14/22 2029 03/15/22 0800  GLUCAP 208* 161* 95    Lab Results  Component Value Date   HGBA1C 6.4 (H) 03/10/2022    Scheduled Meds:  albuterol  2 puff Inhalation BID   allopurinol  100 mg Oral BID   amLODipine  10 mg Oral Daily   vitamin C  500 mg Oral Daily   azithromycin  500 mg Oral Daily   dexamethasone  6 mg Oral Daily   guaiFENesin  600 mg Oral BID   heparin  5,000 Units Subcutaneous Q8H   hydrALAZINE  100 mg Oral Q8H   insulin aspart  0-15 Units Subcutaneous TID WC   insulin aspart  0-5 Units Subcutaneous QHS   levETIRAcetam  500 mg Oral Daily   metoprolol tartrate  100 mg Oral BID   mirtazapine  15 mg Oral QHS   QUEtiapine  25 mg Oral QHS   sodium chloride flush  3 mL Intravenous Q12H   zinc sulfate  220 mg Oral Daily   Continuous Infusions:  sodium chloride 250 mL (03/14/22 1437)   PRN Meds:.sodium chloride, acetaminophen **OR** acetaminophen, guaiFENesin-dextromethorphan, ondansetron **OR** ondansetron (ZOFRAN) IV, senna-docusate, sodium chloride flush  Diet Orders (From admission, onward)  Start     Ordered   03/10/22 1812  Diet heart healthy/carb modified Room service appropriate? Yes; Fluid consistency: Thin  Diet effective now       Question Answer Comment  Diet-HS Snack? Nothing   Room service appropriate? Yes   Fluid consistency: Thin      03/10/22 1812            DVT prophylaxis: heparin injection 5,000 Units Start: 03/10/22 1715 SCDs Start: 03/10/22 1624   Lab Results  Component Value Date   PLT 209 03/15/2022      Code Status: Full  Code  Family Communication: daughter over the phone  Status is: Inpatient Remains inpatient appropriate because: persistent symptoms, hypoxemia   Level of care: Progressive  Consultants:  none  Objective: Vitals:   03/15/22 0454 03/15/22 0620 03/15/22 0756 03/15/22 1012  BP:  (!) 172/54    Pulse:    62  Resp:      Temp:      TempSrc:      SpO2:   97%   Weight: 75.2 kg     Height:        Intake/Output Summary (Last 24 hours) at 03/15/2022 1052 Last data filed at 03/15/2022 0802 Gross per 24 hour  Intake 540 ml  Output 400 ml  Net 140 ml    Wt Readings from Last 3 Encounters:  03/15/22 75.2 kg  12/20/20 77.7 kg  12/08/20 76.7 kg    Examination:  Constitutional: NAD Eyes: lids and conjunctivae normal, no scleral icterus ENMT: mmm Neck: normal, supple Respiratory: clear to auscultation bilaterally, no wheezing, no crackles. Normal respiratory effort.  Cardiovascular: Regular rate and rhythm, no murmurs / rubs / gallops. No LE edema. Abdomen: soft, no distention, no tenderness. Bowel sounds positive.  Skin: no rashes Neurologic: no focal deficits, equal strength   Data Reviewed: I have independently reviewed following labs and imaging studies   CBC Recent Labs  Lab 03/11/22 0519 03/12/22 0455 03/13/22 0539 03/14/22 0617 03/15/22 0734  WBC 3.2* 4.1 5.0 5.0 5.5  HGB 10.0* 10.0* 9.7* 9.5* 9.8*  HCT 31.2* 31.3* 30.1* 29.4* 30.1*  PLT 232 231 220 207 209  MCV 90.4 91.8 90.9 90.2 90.1  MCH 29.0 29.3 29.3 29.1 29.3  MCHC 32.1 31.9 32.2 32.3 32.6  RDW 15.2 15.1 15.3 15.0 14.7  LYMPHSABS 0.5* 0.6* 0.7 0.8 1.0  MONOABS 0.1 0.3 0.3 0.4 0.4  EOSABS 0.0 0.0 0.0 0.0 0.0  BASOSABS 0.0 0.0 0.0 0.0 0.0     Recent Labs  Lab 03/10/22 1332 03/10/22 1637 03/10/22 1652 03/11/22 0519 03/12/22 0455 03/13/22 0539 03/14/22 0617 03/15/22 0734  NA  --   --   --  139 138 140 141 139  K  --   --   --  4.1 3.6 3.9 3.8 3.6  CL  --   --   --  107 107 112* 111 109   CO2  --   --   --  '23 23 22 23 24  '$ GLUCOSE  --   --   --  142* 124* 104* 93 89  BUN  --   --   --  30* 39* 38* 36* 31*  CREATININE  --    < >  --  2.17* 2.03* 1.80* 1.60* 1.47*  CALCIUM  --   --   --  8.6* 8.3* 8.5* 8.5* 8.7*  AST  --   --   --  '21 20 18 21 '$ 17  ALT  --   --   --  '18 16 15 18 18  '$ ALKPHOS  --   --   --  167* 157* 146* 134* 134*  BILITOT  --   --   --  0.2* 0.2* 0.2* 0.3 0.3  ALBUMIN  --   --   --  3.0* 3.1* 2.9* 2.8* 2.8*  MG 1.7  --   --  1.9 1.9 2.0 1.9 2.0  CRP 1.6*  --   --  1.8* 0.8 0.5 <0.5 <0.5  DDIMER 1.93*  --   --  1.65* 1.00*  --   --   --   PROCALCITON <0.10  --   --   --   --   --   --   --   HGBA1C  --   --  6.4*  --   --   --   --   --   BNP 1,131.3*  --   --   --   --   --   --   --    < > = values in this interval not displayed.     ------------------------------------------------------------------------------------------------------------------ No results for input(s): "CHOL", "HDL", "LDLCALC", "TRIG", "CHOLHDL", "LDLDIRECT" in the last 72 hours.  Lab Results  Component Value Date   HGBA1C 6.4 (H) 03/10/2022   ------------------------------------------------------------------------------------------------------------------ No results for input(s): "TSH", "T4TOTAL", "T3FREE", "THYROIDAB" in the last 72 hours.  Invalid input(s): "FREET3"  Cardiac Enzymes No results for input(s): "CKMB", "TROPONINI", "MYOGLOBIN" in the last 168 hours.  Invalid input(s): "CK" ------------------------------------------------------------------------------------------------------------------    Component Value Date/Time   BNP 1,131.3 (H) 03/10/2022 1332    CBG: Recent Labs  Lab 03/14/22 0741 03/14/22 1134 03/14/22 1617 03/14/22 2029 03/15/22 0800  GLUCAP 83 114* 208* 161* 95     Recent Results (from the past 240 hour(s))  SARS Coronavirus 2 by RT PCR (hospital order, performed in [facility] hospital lab) *cepheid single result test* Anterior Nasal  Swab     Status: Abnormal   Collection Time: 03/10/22 10:28 AM   Specimen: Anterior Nasal Swab  Result Value Ref Range Status   SARS Coronavirus 2 by RT PCR POSITIVE (A) NEGATIVE Final    Comment: (NOTE) SARS-CoV-2 target nucleic acids are DETECTED  SARS-CoV-2 RNA is generally detectable in upper respiratory specimens  during the acute phase of infection.  Positive results are indicative  of the presence of the identified virus, but do not rule out bacterial infection or co-infection with other pathogens not detected by the test.  Clinical correlation with patient history and  other diagnostic information is necessary to determine patient infection status.  The expected result is negative.  Fact Sheet for Patients:   https://www.patel.info/   Fact Sheet for Healthcare Providers:   https://hall.com/    This test is not yet approved or cleared by the [place] FDA and  has been authorized for detection and/or diagnosis of SARS-CoV-2 by FDA under an Emergency Use Authorization (EUA).  This EUA will remain in effect (meaning this test can be used) for the duration of  the COVID-19 declaration under Section 564(b)(1)  of the Act, 21 U.S.C. section 360-bbb-3(b)(1), unless the authorization is terminated or revoked sooner.   Performed at [facility], [address] [place], [place] [address]   Urine Culture     Status: Abnormal   Collection Time: 03/10/22  1:25 PM   Specimen: Urine, Clean Catch  Result Value Ref Range Status   Specimen Description  Final    URINE, CLEAN CATCH Performed at [facility], [address], [place], [place] [address]    Special Requests   Final    NONE Performed at [facility], [address], [place], [place] [address]    Culture >=100,000 COLONIES/mL KLEBSIELLA PNEUMONIAE (A)  Final   Report Status 03/12/2022 FINAL  Final   Organism ID,  Bacteria KLEBSIELLA PNEUMONIAE (A)  Final      Susceptibility   Klebsiella pneumoniae - MIC*    AMPICILLIN RESISTANT Resistant     CEFAZOLIN <=4 SENSITIVE Sensitive     CEFEPIME <=0.12 SENSITIVE Sensitive     CEFTRIAXONE <=0.25 SENSITIVE Sensitive     CIPROFLOXACIN <=0.25 SENSITIVE Sensitive     GENTAMICIN <=1 SENSITIVE Sensitive     IMIPENEM <=0.25 SENSITIVE Sensitive     NITROFURANTOIN 64 INTERMEDIATE Intermediate     TRIMETH/SULFA <=20 SENSITIVE Sensitive     AMPICILLIN/SULBACTAM 4 SENSITIVE Sensitive     PIP/TAZO <=4 SENSITIVE Sensitive     * >=100,000 COLONIES/mL KLEBSIELLA PNEUMONIAE  Culture, blood (routine x 2) Call MD if unable to obtain prior to antibiotics being given     Status: None   Collection Time: 03/10/22  1:55 PM   Specimen: BLOOD  Result Value Ref Range Status   Specimen Description   Final    BLOOD LEFT ANTECUBITAL BOTTLES DRAWN AEROBIC AND ANAEROBIC Blood Culture adequate volume Performed at [facility], [address], [place], [place] [address]    Special Requests   Final    NONE Performed at [facility], [address] [place], [place] [address]    Culture   Final    NO GROWTH 5 DAYS Performed at [facility], [address] [place], [place] [address]    Report Status 03/15/2022 FINAL  Final  Culture, blood (routine x 2) Call MD if unable to obtain prior to antibiotics being given     Status: None   Collection Time: 03/10/22  2:00 PM   Specimen: BLOOD RIGHT HAND  Result Value Ref Range Status   Specimen Description   Final    BLOOD RIGHT HAND BOTTLES DRAWN AEROBIC AND ANAEROBIC Blood Culture adequate volume Performed at [facility], [address] [place], [place] [address]    Special Requests   Final    NONE Performed at [facility], [address] [place], [place] [address]    Culture   Final    NO GROWTH 5 DAYS Performed at [facility],  [place] [address], [place],  [address]    Report Status 03/15/2022 FINAL  Final     Radiology Studies: No results found.  Marzetta Board, MD, PhD [facility]  Between 7 am - 7 pm I am available, please contact me via Amion (for emergencies) or Securechat (non urgent messages)  Between 7 pm - 7 am I am not available, please contact night coverage MD/APP via Amion

## 2022-03-16 DIAGNOSIS — U071 COVID-19: Secondary | ICD-10-CM | POA: Diagnosis not present

## 2022-03-16 DIAGNOSIS — J1282 Pneumonia due to coronavirus disease 2019: Secondary | ICD-10-CM | POA: Diagnosis not present

## 2022-03-16 LAB — GLUCOSE, CAPILLARY: Glucose-Capillary: 92 mg/dL (ref 70–99)

## 2022-03-16 MED ORDER — GUAIFENESIN-DM 100-10 MG/5ML PO SYRP: 10.0000 mL | ORAL_SOLUTION | ORAL | 0 refills | Status: DC | PRN

## 2022-03-16 MED ORDER — DEXAMETHASONE 6 MG PO TABS: 6.0000 mg | ORAL_TABLET | Freq: Every day | ORAL | 0 refills | Status: AC

## 2022-03-16 MED ORDER — ALBUTEROL SULFATE HFA 108 (90 BASE) MCG/ACT IN AERS: 2.0000 | INHALATION_SPRAY | Freq: Four times a day (QID) | RESPIRATORY_TRACT | 0 refills | Status: DC | PRN

## 2022-03-16 NOTE — Progress Notes (Signed)
Pt discharged to home. Prior to DC, IV and tele was removed. Pt was given DC paperwork regarding medications, appointments, and condition. Pt and daughter verbalized understanding and stated no other concerns at this time. Pt stable at time of DC , and left in personal vehicle driven by daughter.

## 2022-03-16 NOTE — TOC Transition Note (Signed)
Transition of Care [facility]) - CM/SW Discharge Note   Patient Details  Name: [name] MRN: [id number] Date of Birth: 1929/06/09  [facility] CM/SW Contact:  Leeroy Cha, RN Phone Number: 03/16/2022, 10:06 AM   Clinical Narrative:    Pt dcd to home with hhc through [facility] agency.   Final next level of care: [place] Barriers to Discharge: Barriers Resolved   Patient Goals and CMS Choice        Discharge Placement                       Discharge Plan and Services                          HH Arranged: [facility] Agency: [facility] Date HH Agency Contacted: 03/13/22 Time [place]: 1117 Representative spoke with at [place]: Bryson Corona  Social Determinants of Health ([place]) Interventions     Readmission Risk Interventions     No data to display

## 2022-03-16 NOTE — Discharge Summary (Signed)
Physician Discharge Summary  [name] JEH:[id number] DOB: 1928-08-27 DOA: 03/10/2022  PCP: Laura Carol, MD  Admit date: 03/10/2022 Discharge date: 03/16/2022  Admitted From: home Disposition:  home  Recommendations for Outpatient Follow-up:  Follow up with PCP in 1-2 weeks  Home Health: PT Equipment/Devices: none  Discharge Condition: stable CODE STATUS: Full code Diet Orders (From admission, onward)     Start     Ordered   03/10/22 1812  Diet heart healthy/carb modified Room service appropriate? Yes; Fluid consistency: Thin  Diet effective now       Question Answer Comment  Diet-HS Snack? Nothing   Room service appropriate? Yes   Fluid consistency: Thin      03/10/22 1812            HPI: Per admitting MD, [name] is a 88 y.o. female past medical history significant for dementia, diabetes, GERD, hypertension, CKD stage IV,  presents with generalized weakness, body ache since 4 days prior to admission.  Her daughter was diagnosed with COVID 4 days prior to admission.  Patient reports cough dry cough, and shortness of breath that is started 4 days prior to admission.  She denies chest pain, diarrhea, nausea or vomiting.  She has been eating okay.  Hospital Course / Discharge diagnoses: Principal Problem:   Pneumonia due to COVID-19 virus Active Problems:   UTI (urinary tract infection)   Dementia ([place])   AKI (acute kidney injury) ([place])   Type 2 diabetes mellitus with complication, without long-term current use of insulin ([facility])   Glaucoma   CKD (chronic kidney disease), stage IV ([place])   Principal problem Acute hypoxic respiratory failure due to COVID-19 pneumonia-patient was admitted to the hospital with COVID-19 pneumonia.  Bacterial pneumonia could not be ruled out, and she was placed on molnupiravir along with antibiotics.  Given hypoxia she was placed on Decadron.  With treatment, she has improved, she was able to be weaned off to room air.  Completed 5  days of antivirals and antibacterials while hospitalized, and will need 3 additional days of Decadron upon discharge to complete a 10-day course.  Active problems Acute kidney injury on chronic kidney disease stage IV-baseline creatinine around 1.6, it was as high as 2.1 on admission.  Nephrology consulted, received IV fluids and now creatinine has returned to his baseline and improved to 1.4 upon discharge  Dementia, depression-continue home medications Acute on chronic diastolic CHF-received IV Lasix on admission, this has now been held due to acute kidney injury.  Nephrology recommending to hold diuretics now and resume at discharge.  Echo was done and showed normal EF and grade 2 diastolic dysfunction. Essential hypertension-continue hydralazine, Norvasc Normocytic anemia-likely in the setting of chronic kidney disease.  Hemoglobin stable, no bleeding. DM2-A1c 6.4.  Continue to monitor sugars while getting steroids, controlled for most part as below  Sepsis ruled out   Discharge Instructions   Allergies as of 03/16/2022       Reactions   Ace Inhibitors Swelling, Rash, Cough   Prandin [repaglinide] Other (See Comments)   Caused significant peripheral edema   Dilaudid [hydromorphone Hcl] Itching, Other (See Comments)   Hallucinations (auditory and visual) also   Other Nausea And Vomiting   "Tussionex Pennkinetic ER"   Latex Rash   Tape Rash   Prefers PAPER TAPE, PLEASE!!        Medication List     STOP taking these medications    Lantus SoloStar 100 UNIT/ML Solostar Pen Generic  drug: insulin glargine   repaglinide 0.5 MG tablet Commonly known as: PRANDIN       TAKE these medications    albuterol 108 (90 Base) MCG/ACT inhaler Commonly known as: VENTOLIN HFA Inhale 2 puffs into the lungs every 6 (six) hours as needed for wheezing or shortness of breath.   allopurinol 100 MG tablet Commonly known as: ZYLOPRIM Take 100 mg by mouth 2 (two) times daily.    amLODipine 10 MG tablet Commonly known as: NORVASC Take 10 mg by mouth daily.   augmented betamethasone dipropionate 0.05 % cream Commonly known as: DIPROLENE-AF Apply 1 application  topically daily as needed for dry skin (affected areas).   blood glucose meter kit and supplies Kit Dispense based on patient and insurance preference. Use up to four times daily as directed. (FOR ICD-9 250.00, 250.01). For QAC - HS accuchecks.   Centrum Silver 50+Women Tabs Take 1 tablet by mouth daily with breakfast.   dexamethasone 6 MG tablet Commonly known as: DECADRON Take 1 tablet (6 mg total) by mouth daily for 3 days.   FreeStyle Libre 2 Sensor Misc USE AS DIRECTED. REAPPLY EVERY 14 DAYS What changed: See the new instructions.   furosemide 40 MG tablet Commonly known as: LASIX Take 40 mg by mouth in the morning.   gabapentin 100 MG capsule Commonly known as: NEURONTIN Take 100 mg by mouth 2 (two) times daily.   guaiFENesin-dextromethorphan 100-10 MG/5ML syrup Commonly known as: ROBITUSSIN DM Take 10 mLs by mouth every 4 (four) hours as needed for cough.   hydrALAZINE 100 MG tablet Commonly known as: APRESOLINE Take 1 tablet (100 mg total) by mouth every 8 (eight) hours. What changed: when to take this   Insulin Syringe-Needle U-100 25G X 1" 1 ML Misc For 4 times a day insulin SQ, 1 month supply. Diagnosis E11.65   levETIRAcetam 500 MG tablet Commonly known as: KEPPRA Take 500 mg by mouth in the morning. What changed: Another medication with the same name was removed. Continue taking this medication, and follow the directions you see here.   loratadine 10 MG tablet Commonly known as: CLARITIN Take 10 mg by mouth daily as needed for allergies.   metoprolol tartrate 100 MG tablet Commonly known as: LOPRESSOR Take 100 mg by mouth 2 (two) times daily.   mirtazapine 15 MG tablet Commonly known as: REMERON Take 15 mg by mouth at bedtime.   NovoLOG FlexPen 100 UNIT/ML  FlexPen Generic drug: insulin aspart Inject into the skin See admin instructions. Inject as directed into the skin three times a day with meals, per sliding scale and ONLY if the BGL is 200 or greater   omeprazole 20 MG capsule Commonly known as: PRILOSEC Take 20 mg by mouth every Monday, Wednesday, and Friday.   QUEtiapine 25 MG tablet Commonly known as: SEROQUEL Take 25 mg by mouth at bedtime.        Follow-up Information     [facility] up.   Specialty: [place] Why: [facility] physical therapy Contact information: [address] [place] [address] [phone]                 Procedures/Studies:  US RENAL  Result Date: 2022-03-13 CLINICAL DATA:  Acute on chronic kidney disease. EXAM: RENAL / URINARY TRACT ULTRASOUND COMPLETE COMPARISON:  None Available. FINDINGS: Right Kidney: Renal measurements: 10.2 x 3.8 x 4.6 = volume: 93.9 mL. Echogenicity is increased. No hydronephrosis. There are 3 cysts in the right  kidney. The largest is in the mid kidney measuring 1.2 x 1.1 x 1.2 cm. Left Kidney: Renal measurements: 10.8 x 5.4 x 5.0 cm = volume: 152 mL. Echogenicity is increased. No hydronephrosis. There is an inferior pole cyst measuring 5.3 x 4.3 x 5.3 cm. Bladder: Appears normal for degree of bladder distention. Other: None. IMPRESSION: 1. Echogenic kidneys likely related to medical renal disease. 2. No hydronephrosis. 3. Bilateral renal cysts. Electronically Signed   By: Ronney Asters M.D.   On: 03/11/2022 21:33   ECHOCARDIOGRAM COMPLETE  Result Date: 03/11/2022    ECHOCARDIOGRAM REPORT   Patient Name:   SEAIRRA OTANI Date of Exam: 03/11/2022 Medical Rec #:  [id number]  Height:       62.0 in Accession #:    [id number] Weight:       162.0 lb Date of Birth:  08-May-1929  BSA:          1.748 m Patient Age:    88 years   BP:           147/52 mmHg Patient Gender: F          HR:           65 bpm. Exam Location:  Inpatient Procedure: 2D Echo, 3D Echo,  Cardiac Doppler and Color Doppler Indications:    R06.02 SOB  History:        Patient has no prior history of Echocardiogram examinations.                 Signs/Symptoms:Alzheimer's; Risk Factors:Diabetes and                 Hypertension. Covid positive.  Sonographer:    Roseanna Rainbow RDCS Referring Phys: [id number] BELKYS A REGALADO IMPRESSIONS  1. Left ventricular ejection fraction, by estimation, is 60 to 65%. Left ventricular ejection fraction by 3D volume is 60 %. The left ventricle has normal function. The left ventricle has no regional wall motion abnormalities. There is moderate asymmetric left ventricular hypertrophy of the septal segment. Left ventricular diastolic parameters are consistent with Grade II diastolic dysfunction (pseudonormalization). There is the interventricular septum is flattened in diastole ('D' shaped left ventricle), consistent with right ventricular volume overload.  2. Right ventricular systolic function is normal. The right ventricular size is mildly enlarged. There is severely elevated pulmonary artery systolic pressure. The estimated right ventricular systolic pressure is 01.7 mmHg.  3. Left atrial size was moderately dilated.  4. The mitral valve is normal in structure. No evidence of mitral valve regurgitation.  5. Tricuspid valve regurgitation is mild to moderate.  6. The aortic valve is tricuspid. There is mild calcification of the aortic valve. Aortic valve regurgitation is not visualized. No aortic stenosis is present.  7. The inferior vena cava is dilated in size with <50% respiratory variability, suggesting right atrial pressure of 15 mmHg.  8. Cannot exclude a small PFO. Comparison(s): No prior Echocardiogram. FINDINGS  Left Ventricle: Left ventricular ejection fraction, by estimation, is 60 to 65%. Left ventricular ejection fraction by 3D volume is 60 %. The left ventricle has normal function. The left ventricle has no regional wall motion abnormalities. The left ventricular  internal cavity size was normal in size. There is moderate asymmetric left ventricular hypertrophy of the septal segment. The interventricular septum is flattened in diastole ('D' shaped left ventricle), consistent with right ventricular volume overload. Left ventricular diastolic parameters are consistent with Grade II diastolic dysfunction (pseudonormalization). Right Ventricle: The right ventricular size is mildly  enlarged. No increase in right ventricular wall thickness. Right ventricular systolic function is normal. There is severely elevated pulmonary artery systolic pressure. The tricuspid regurgitant velocity is 4.04 m/s, and with an assumed right atrial pressure of 15 mmHg, the estimated right ventricular systolic pressure is 03.0 mmHg. Left Atrium: Left atrial size was moderately dilated. Right Atrium: Right atrial size was normal in size. Pericardium: There is no evidence of pericardial effusion. Mitral Valve: The mitral valve is normal in structure. No evidence of mitral valve regurgitation. MV peak gradient, 8.0 mmHg. The mean mitral valve gradient is 3.0 mmHg. Tricuspid Valve: The tricuspid valve is normal in structure. Tricuspid valve regurgitation is mild to moderate. No evidence of tricuspid stenosis. Aortic Valve: The aortic valve is tricuspid. There is mild calcification of the aortic valve. There is mild aortic valve annular calcification. Aortic valve regurgitation is not visualized. No aortic stenosis is present. Pulmonic Valve: The pulmonic valve was normal in structure. Pulmonic valve regurgitation is not visualized. No evidence of pulmonic stenosis. Aorta: The aortic root and ascending aorta are structurally normal, with no evidence of dilitation. Venous: The inferior vena cava is dilated in size with less than 50% respiratory variability, suggesting right atrial pressure of 15 mmHg. IAS/Shunts: Cannot exclude a small PFO.  LEFT VENTRICLE PLAX 2D LVIDd:         4.05 cm         Diastology  LVIDs:         2.50 cm         LV e' medial:    4.46 cm/s LV PW:         1.15 cm         LV E/e' medial:  25.1 LV IVS:        1.35 cm         LV e' lateral:   5.98 cm/s LVOT diam:     2.20 cm         LV E/e' lateral: 18.7 LV SV:         113 LV SV Index:   65 LVOT Area:     3.80 cm        3D Volume EF                                LV 3D EF:    Left                                             ventricul LV Volumes (MOD)                            ar LV vol d, MOD    73.0 ml                    ejection A2C:                                        fraction LV vol d, MOD    76.1 ml                    by 3D A4C:  volume is LV vol s, MOD    25.9 ml                    60 %. A2C: LV vol s, MOD    22.4 ml A4C:                           3D Volume EF: LV SV MOD A2C:   47.1 ml       3D EF:        60 % LV SV MOD A4C:   76.1 ml       LV EDV:       95 ml LV SV MOD BP:    49.4 ml       LV ESV:       38 ml                                LV SV:        58 ml RIGHT VENTRICLE            IVC RV S prime:     8.38 cm/s  IVC diam: 2.30 cm TAPSE (M-mode): 2.8 cm LEFT ATRIUM             Index        RIGHT ATRIUM           Index LA diam:        3.60 cm 2.06 cm/m   RA Area:     18.20 cm LA Vol (A2C):   61.8 ml 35.36 ml/m  RA Volume:   53.10 ml  30.38 ml/m LA Vol (A4C):   78.9 ml 45.14 ml/m LA Biplane Vol: 72.3 ml 41.36 ml/m  AORTIC VALVE LVOT Vmax:   134.50 cm/s LVOT Vmean:  88.400 cm/s LVOT VTI:    0.298 m  AORTA Ao Root diam: 3.00 cm Ao Asc diam:  3.40 cm MITRAL VALVE                TRICUSPID VALVE MV Area (PHT): 3.34 cm     TR Peak grad:   65.3 mmHg MV Area VTI:   2.41 cm     TR Vmax:        404.00 cm/s MV Peak grad:  8.0 mmHg MV Mean grad:  3.0 mmHg     SHUNTS MV Vmax:       1.41 m/s     Systemic VTI:  0.30 m MV Vmean:      72.3 cm/s    Systemic Diam: 2.20 cm MV Decel Time: 227 msec MV E velocity: 112.00 cm/s MV A velocity: 116.50 cm/s MV E/A ratio:  0.96 Rudean Haskell MD Electronically  signed by Rudean Haskell MD Signature Date/Time: 03/11/2022/5:20:11 PM    Final    DG Chest Port 1 View  Result Date: 03/10/2022 CLINICAL DATA:  Cough EXAM: PORTABLE CHEST 1 VIEW COMPARISON:  12/29/2020 and prior radiographs FINDINGS: Cardiomegaly identified. Slightly increased interstitial/patchy opacities within the mid lungs bilaterally noted likely representing infection. There is no evidence of pleural effusion or pneumothorax. No acute bony abnormalities are present. Severe glenohumeral joint degenerative changes again noted IMPRESSION: 1. Slightly increased interstitial/patchy opacities within the mid lungs bilaterally likely representing infection. 2. Cardiomegaly. Electronically Signed   By: Margarette Canada M.D.   On: 03/10/2022 12:41     Subjective: - no chest pain, shortness of  breath, no abdominal pain, nausea or vomiting.   Discharge Exam: BP (!) 177/49 (BP Location: Right Arm)   Pulse (!) 52   Temp 97.7 F (36.5 C) (Oral)   Resp 18   Ht _0  (1.575 m)   Wt 70.8 kg   SpO2 96%   BMI 28.55 kg/m   General: Pt is alert, awake, not in acute distress Cardiovascular: RRR, S1/S2 +, no rubs, no gallops Respiratory: CTA bilaterally, no wheezing, no rhonchi Abdominal: Soft, NT, ND, bowel sounds + Extremities: no edema, no cyanosis  The results of significant diagnostics from this hospitalization (including imaging, microbiology, ancillary and laboratory) are listed below for reference.     Microbiology: Recent Results (from the past 240 hour(s))  SARS Coronavirus 2 by RT PCR (hospital order, performed in [facility] hospital lab) *cepheid single result test* Anterior Nasal Swab     Status: Abnormal   Collection Time: 03/10/22 10:28 AM   Specimen: Anterior Nasal Swab  Result Value Ref Range Status   SARS Coronavirus 2 by RT PCR POSITIVE (A) NEGATIVE Final    Comment: (NOTE) SARS-CoV-2 target nucleic acids are DETECTED  SARS-CoV-2 RNA is generally detectable in upper  respiratory specimens  during the acute phase of infection.  Positive results are indicative  of the presence of the identified virus, but do not rule out bacterial infection or co-infection with other pathogens not detected by the test.  Clinical correlation with patient history and  other diagnostic information is necessary to determine patient infection status.  The expected result is negative.  Fact Sheet for Patients:   https://www.patel.info/   Fact Sheet for Healthcare Providers:   https://hall.com/    This test is not yet approved or cleared by the [place] FDA and  has been authorized for detection and/or diagnosis of SARS-CoV-2 by FDA under an Emergency Use Authorization (EUA).  This EUA will remain in effect (meaning this test can be used) for the duration of  the COVID-19 declaration under Section 564(b)(1)  of the Act, 21 U.S.C. section 360-bbb-3(b)(1), unless the authorization is terminated or revoked sooner.   Performed at [facility], [address] [place], [place] [address]   Urine Culture     Status: Abnormal   Collection Time: 03/10/22  1:25 PM   Specimen: Urine, Clean Catch  Result Value Ref Range Status   Specimen Description   Final    URINE, CLEAN CATCH Performed at [facility], [address], [place], [place] [address]    Special Requests   Final    NONE Performed at [facility], [address], [place], [place] [address]    Culture >=100,000 COLONIES/mL KLEBSIELLA PNEUMONIAE (A)  Final   Report Status 03/12/2022 FINAL  Final   Organism ID, Bacteria KLEBSIELLA PNEUMONIAE (A)  Final      Susceptibility   Klebsiella pneumoniae - MIC*    AMPICILLIN RESISTANT Resistant     CEFAZOLIN <=4 SENSITIVE Sensitive     CEFEPIME <=0.12 SENSITIVE Sensitive     CEFTRIAXONE <=0.25 SENSITIVE Sensitive     CIPROFLOXACIN <=0.25 SENSITIVE Sensitive      GENTAMICIN <=1 SENSITIVE Sensitive     IMIPENEM <=0.25 SENSITIVE Sensitive     NITROFURANTOIN 64 INTERMEDIATE Intermediate     TRIMETH/SULFA <=20 SENSITIVE Sensitive     AMPICILLIN/SULBACTAM 4 SENSITIVE Sensitive     PIP/TAZO <=4 SENSITIVE Sensitive     * >=100,000 COLONIES/mL KLEBSIELLA PNEUMONIAE  Culture, blood (routine x 2) Call MD if  unable to obtain prior to antibiotics being given     Status: None   Collection Time: 03/10/22  1:55 PM   Specimen: BLOOD  Result Value Ref Range Status   Specimen Description   Final    BLOOD LEFT ANTECUBITAL BOTTLES DRAWN AEROBIC AND ANAEROBIC Blood Culture adequate volume Performed at [facility], [address], [place], [place] [address]    Special Requests   Final    NONE Performed at [facility], [address], [place], [place] [address]    Culture   Final    NO GROWTH 5 DAYS Performed at [facility], [place] [address], [place], [place] [address]    Report Status 03/15/2022 FINAL  Final  Culture, blood (routine x 2) Call MD if unable to obtain prior to antibiotics being given     Status: None   Collection Time: 03/10/22  2:00 PM   Specimen: BLOOD RIGHT HAND  Result Value Ref Range Status   Specimen Description   Final    BLOOD RIGHT HAND BOTTLES DRAWN AEROBIC AND ANAEROBIC Blood Culture adequate volume Performed at [facility], [address], [place], [place] [address]    Special Requests   Final    NONE Performed at [facility], [address], [place], [place] [address]    Culture   Final    NO GROWTH 5 DAYS Performed at [facility], [place] [address] [place],  [address]    Report Status 03/15/2022 FINAL  Final     Labs: Basic Metabolic Panel: Recent Labs  Lab 03/11/22 0519 03/12/22 0455 03/13/22 0539 03/14/22 0617 03/15/22 0734  NA 139 138 140 141 139  K 4.1 3.6 3.9 3.8 3.6  CL 107 107 112* 111 109  CO2 _0 GLUCOSE 142* 124* 104* 93 89  BUN 30* 39* 38* 36* 31*  CREATININE 2.17* 2.03* 1.80* 1.60* 1.47*  CALCIUM 8.6* 8.3* 8.5* 8.5* 8.7*  MG 1.9 1.9 2.0 1.9 2.0  PHOS 5.2* 4.8* 4.1 3.5 3.1   Liver Function Tests: Recent Labs  Lab 03/11/22 0519 03/12/22 0455 03/13/22 0539 03/14/22 0617 03/15/22 0734  AST _1 ALT _2 ALKPHOS 167* 157* 146* 134* 134*  BILITOT 0.2* 0.2* 0.2* 0.3 0.3  PROT 7.1 7.2 6.8 6.9 6.8  ALBUMIN 3.0* 3.1* 2.9* 2.8* 2.8*   CBC: Recent Labs  Lab 03/11/22 0519 03/12/22 0455 03/13/22 0539 03/14/22 0617 03/15/22 0734  WBC 3.2* 4.1 5.0 5.0 5.5  NEUTROABS 2.6 3.2 3.9 3.8 4.1  HGB 10.0* 10.0* 9.7* 9.5* 9.8*  HCT 31.2* 31.3* 30.1* 29.4* 30.1*  MCV 90.4 91.8 90.9 90.2 90.1  PLT 232 231 220 207 209   CBG: Recent Labs  Lab 03/15/22 0800 03/15/22 1117 03/15/22 1640 03/15/22 2011 03/16/22 0745  GLUCAP 95 114* 223* 140* 92   Hgb A1c No results for input(s): "HGBA1C" in the last 72 hours. Lipid Profile No results for input(s): "CHOL", "HDL", "LDLCALC", "TRIG", "CHOLHDL", "LDLDIRECT" in the last 72 hours. Thyroid function studies No results for input(s): "TSH", "T4TOTAL", "T3FREE", "THYROIDAB" in the last 72 hours.  Invalid input(s): "FREET3" Urinalysis    Component Value Date/Time   COLORURINE YELLOW 03/10/2022 1150   APPEARANCEUR HAZY (A) 03/10/2022 1150   LABSPEC 1.013 03/10/2022 1150   PHURINE 6.0 03/10/2022 [place] 03/10/2022 [place] 03/10/2022 [place] 03/10/2022  [place] 03/10/2022 1150   PROTEINUR >300 (A) 03/10/2022 1150   UROBILINOGEN 0.2 11/21/2008 1820   NITRITE NEGATIVE 03/10/2022 1150   LEUKOCYTESUR MODERATE (A) 03/10/2022 1150    FURTHER DISCHARGE INSTRUCTIONS:   Get Medicines reviewed and adjusted: Please take all your medications with you for your next visit with your Primary MD   Laboratory/radiological data: Please request your Primary  MD to go over all hospital tests and procedure/radiological results at the follow up, please ask your Primary MD to get all Hospital records sent to his/her office.   In some cases, they will be blood work, cultures and biopsy results pending at the time of your discharge. Please request that your primary care M.D. goes through all the records of your hospital data and follows up on these results.   Also Note the following: If you experience worsening of your admission symptoms, develop shortness of breath, life threatening emergency, suicidal or homicidal thoughts you must seek medical attention immediately by calling 911 or calling your MD immediately  if symptoms less severe.   You must read complete instructions/literature along with all the possible adverse reactions/side effects for all the Medicines you take and that have been prescribed to you. Take any new Medicines after you have completely understood and accpet all the possible adverse reactions/side effects.    Do not drive when taking Pain medications or sleeping medications (Benzodaizepines)   Do not take more than prescribed Pain, Sleep and Anxiety Medications. It is not advisable to combine anxiety,sleep and pain medications without talking with your primary care practitioner   Special Instructions: If you have smoked or chewed Tobacco  in the last 2 yrs please stop smoking, stop any regular Alcohol  and or any Recreational drug use.   Wear Seat belts while driving.   Please note: You were cared for by a hospitalist during your hospital stay. Once you are discharged, your primary care physician will handle any further medical issues. Please note that NO REFILLS for any discharge medications will be authorized once you are discharged, as it is imperative that you return to your primary care physician (or establish a relationship with a primary care physician if you do not have one) for your post hospital discharge needs so that they  can reassess your need for medications and monitor your lab values.  Time coordinating discharge: 40 minutes  SIGNED:  Marzetta Board, MD, PhD 03/16/2022, 10:02 AM

## 2022-03-22 DIAGNOSIS — I5033 Acute on chronic diastolic (congestive) heart failure: Secondary | ICD-10-CM | POA: Diagnosis not present

## 2022-03-22 DIAGNOSIS — N179 Acute kidney failure, unspecified: Secondary | ICD-10-CM | POA: Diagnosis not present

## 2022-03-22 DIAGNOSIS — J9600 Acute respiratory failure, unspecified whether with hypoxia or hypercapnia: Secondary | ICD-10-CM | POA: Diagnosis not present

## 2022-03-22 DIAGNOSIS — Z8616 Personal history of COVID-19: Secondary | ICD-10-CM | POA: Diagnosis not present

## 2022-04-03 DIAGNOSIS — Z87440 Personal history of urinary (tract) infections: Secondary | ICD-10-CM | POA: Diagnosis not present

## 2022-04-03 DIAGNOSIS — Z8616 Personal history of COVID-19: Secondary | ICD-10-CM | POA: Diagnosis not present

## 2022-04-03 DIAGNOSIS — R5383 Other fatigue: Secondary | ICD-10-CM | POA: Diagnosis not present

## 2022-06-28 DIAGNOSIS — M9711XA Periprosthetic fracture around internal prosthetic right knee joint, initial encounter: Secondary | ICD-10-CM | POA: Diagnosis not present

## 2022-06-30 ENCOUNTER — Inpatient Hospital Stay (HOSPITAL_COMMUNITY)
Admission: RE | Admit: 2022-06-30 | Discharge: 2022-07-11 | DRG: 480 | Disposition: A | Discharge status: Skilled Nursing Facility | Payer: PPO | Attending: Internal Medicine | Admitting: Internal Medicine

## 2022-06-30 ENCOUNTER — Inpatient Hospital Stay (HOSPITAL_COMMUNITY): Payer: PPO

## 2022-06-30 ENCOUNTER — Encounter (HOSPITAL_COMMUNITY): Payer: Self-pay

## 2022-06-30 DIAGNOSIS — Z885 Allergy status to narcotic agent status: Secondary | ICD-10-CM | POA: Diagnosis not present

## 2022-06-30 DIAGNOSIS — D509 Iron deficiency anemia, unspecified: Secondary | ICD-10-CM | POA: Diagnosis present

## 2022-06-30 DIAGNOSIS — S72451A Displaced supracondylar fracture without intracondylar extension of lower end of right femur, initial encounter for closed fracture: Secondary | ICD-10-CM | POA: Diagnosis not present

## 2022-06-30 DIAGNOSIS — S72401S Unspecified fracture of lower end of right femur, sequela: Secondary | ICD-10-CM | POA: Diagnosis not present

## 2022-06-30 DIAGNOSIS — I130 Hypertensive heart and chronic kidney disease with heart failure and stage 1 through stage 4 chronic kidney disease, or unspecified chronic kidney disease: Secondary | ICD-10-CM | POA: Diagnosis present

## 2022-06-30 DIAGNOSIS — Z741 Need for assistance with personal care: Secondary | ICD-10-CM | POA: Diagnosis not present

## 2022-06-30 DIAGNOSIS — N179 Acute kidney failure, unspecified: Secondary | ICD-10-CM | POA: Diagnosis not present

## 2022-06-30 DIAGNOSIS — M898X9 Other specified disorders of bone, unspecified site: Secondary | ICD-10-CM | POA: Diagnosis present

## 2022-06-30 DIAGNOSIS — D62 Acute posthemorrhagic anemia: Secondary | ICD-10-CM | POA: Diagnosis not present

## 2022-06-30 DIAGNOSIS — J9811 Atelectasis: Secondary | ICD-10-CM | POA: Diagnosis not present

## 2022-06-30 DIAGNOSIS — Z91040 Latex allergy status: Secondary | ICD-10-CM

## 2022-06-30 DIAGNOSIS — Z888 Allergy status to other drugs, medicaments and biological substances status: Secondary | ICD-10-CM | POA: Diagnosis not present

## 2022-06-30 DIAGNOSIS — Z87891 Personal history of nicotine dependence: Secondary | ICD-10-CM

## 2022-06-30 DIAGNOSIS — Y9259 Other trade areas as the place of occurrence of the external cause: Secondary | ICD-10-CM

## 2022-06-30 DIAGNOSIS — J9601 Acute respiratory failure with hypoxia: Secondary | ICD-10-CM | POA: Diagnosis not present

## 2022-06-30 DIAGNOSIS — Z794 Long term (current) use of insulin: Secondary | ICD-10-CM | POA: Diagnosis not present

## 2022-06-30 DIAGNOSIS — W19XXXA Unspecified fall, initial encounter: Secondary | ICD-10-CM | POA: Diagnosis not present

## 2022-06-30 DIAGNOSIS — Z7401 Bed confinement status: Secondary | ICD-10-CM | POA: Diagnosis not present

## 2022-06-30 DIAGNOSIS — B9620 Unspecified Escherichia coli [E. coli] as the cause of diseases classified elsewhere: Secondary | ICD-10-CM | POA: Diagnosis present

## 2022-06-30 DIAGNOSIS — I5032 Chronic diastolic (congestive) heart failure: Secondary | ICD-10-CM

## 2022-06-30 DIAGNOSIS — N390 Urinary tract infection, site not specified: Secondary | ICD-10-CM | POA: Diagnosis not present

## 2022-06-30 DIAGNOSIS — R531 Weakness: Secondary | ICD-10-CM | POA: Diagnosis not present

## 2022-06-30 DIAGNOSIS — Z23 Encounter for immunization: Secondary | ICD-10-CM | POA: Diagnosis not present

## 2022-06-30 DIAGNOSIS — K219 Gastro-esophageal reflux disease without esophagitis: Secondary | ICD-10-CM | POA: Diagnosis not present

## 2022-06-30 DIAGNOSIS — Z79899 Other long term (current) drug therapy: Secondary | ICD-10-CM

## 2022-06-30 DIAGNOSIS — M6281 Muscle weakness (generalized): Secondary | ICD-10-CM | POA: Diagnosis not present

## 2022-06-30 DIAGNOSIS — I10 Essential (primary) hypertension: Secondary | ICD-10-CM | POA: Diagnosis present

## 2022-06-30 DIAGNOSIS — B961 Klebsiella pneumoniae [K. pneumoniae] as the cause of diseases classified elsewhere: Secondary | ICD-10-CM | POA: Diagnosis present

## 2022-06-30 DIAGNOSIS — N250 Renal osteodystrophy: Secondary | ICD-10-CM | POA: Diagnosis not present

## 2022-06-30 DIAGNOSIS — E118 Type 2 diabetes mellitus with unspecified complications: Secondary | ICD-10-CM | POA: Diagnosis present

## 2022-06-30 DIAGNOSIS — Z91048 Other nonmedicinal substance allergy status: Secondary | ICD-10-CM

## 2022-06-30 DIAGNOSIS — S72401A Unspecified fracture of lower end of right femur, initial encounter for closed fracture: Principal | ICD-10-CM | POA: Diagnosis present

## 2022-06-30 DIAGNOSIS — S7291XA Unspecified fracture of right femur, initial encounter for closed fracture: Secondary | ICD-10-CM | POA: Diagnosis not present

## 2022-06-30 DIAGNOSIS — W109XXA Fall (on) (from) unspecified stairs and steps, initial encounter: Secondary | ICD-10-CM | POA: Diagnosis present

## 2022-06-30 DIAGNOSIS — E860 Dehydration: Secondary | ICD-10-CM | POA: Diagnosis not present

## 2022-06-30 DIAGNOSIS — I509 Heart failure, unspecified: Secondary | ICD-10-CM | POA: Diagnosis not present

## 2022-06-30 DIAGNOSIS — R569 Unspecified convulsions: Secondary | ICD-10-CM | POA: Diagnosis not present

## 2022-06-30 DIAGNOSIS — I739 Peripheral vascular disease, unspecified: Secondary | ICD-10-CM | POA: Diagnosis not present

## 2022-06-30 DIAGNOSIS — N170 Acute kidney failure with tubular necrosis: Secondary | ICD-10-CM | POA: Diagnosis not present

## 2022-06-30 DIAGNOSIS — R41841 Cognitive communication deficit: Secondary | ICD-10-CM | POA: Diagnosis not present

## 2022-06-30 DIAGNOSIS — N281 Cyst of kidney, acquired: Secondary | ICD-10-CM | POA: Diagnosis not present

## 2022-06-30 DIAGNOSIS — M9711XA Periprosthetic fracture around internal prosthetic right knee joint, initial encounter: Principal | ICD-10-CM | POA: Diagnosis present

## 2022-06-30 DIAGNOSIS — F0390 Unspecified dementia without behavioral disturbance: Secondary | ICD-10-CM | POA: Diagnosis present

## 2022-06-30 DIAGNOSIS — R4182 Altered mental status, unspecified: Secondary | ICD-10-CM | POA: Diagnosis not present

## 2022-06-30 DIAGNOSIS — E8721 Acute metabolic acidosis: Secondary | ICD-10-CM | POA: Diagnosis not present

## 2022-06-30 DIAGNOSIS — M19031 Primary osteoarthritis, right wrist: Secondary | ICD-10-CM | POA: Diagnosis not present

## 2022-06-30 DIAGNOSIS — S0990XA Unspecified injury of head, initial encounter: Secondary | ICD-10-CM | POA: Diagnosis not present

## 2022-06-30 DIAGNOSIS — M1711 Unilateral primary osteoarthritis, right knee: Secondary | ICD-10-CM | POA: Diagnosis not present

## 2022-06-30 DIAGNOSIS — Z853 Personal history of malignant neoplasm of breast: Secondary | ICD-10-CM

## 2022-06-30 DIAGNOSIS — E1122 Type 2 diabetes mellitus with diabetic chronic kidney disease: Secondary | ICD-10-CM | POA: Diagnosis not present

## 2022-06-30 DIAGNOSIS — M189 Osteoarthritis of first carpometacarpal joint, unspecified: Secondary | ICD-10-CM | POA: Diagnosis not present

## 2022-06-30 DIAGNOSIS — N184 Chronic kidney disease, stage 4 (severe): Secondary | ICD-10-CM | POA: Diagnosis present

## 2022-06-30 DIAGNOSIS — S79929A Unspecified injury of unspecified thigh, initial encounter: Secondary | ICD-10-CM | POA: Diagnosis not present

## 2022-06-30 DIAGNOSIS — D649 Anemia, unspecified: Secondary | ICD-10-CM

## 2022-06-30 DIAGNOSIS — S72491A Other fracture of lower end of right femur, initial encounter for closed fracture: Secondary | ICD-10-CM | POA: Diagnosis not present

## 2022-06-30 DIAGNOSIS — H409 Unspecified glaucoma: Secondary | ICD-10-CM | POA: Diagnosis present

## 2022-06-30 DIAGNOSIS — M85831 Other specified disorders of bone density and structure, right forearm: Secondary | ICD-10-CM | POA: Diagnosis not present

## 2022-06-30 DIAGNOSIS — R262 Difficulty in walking, not elsewhere classified: Secondary | ICD-10-CM | POA: Diagnosis not present

## 2022-06-30 DIAGNOSIS — I129 Hypertensive chronic kidney disease with stage 1 through stage 4 chronic kidney disease, or unspecified chronic kidney disease: Secondary | ICD-10-CM | POA: Diagnosis not present

## 2022-06-30 DIAGNOSIS — Z9889 Other specified postprocedural states: Secondary | ICD-10-CM | POA: Diagnosis not present

## 2022-06-30 DIAGNOSIS — Z4789 Encounter for other orthopedic aftercare: Secondary | ICD-10-CM | POA: Diagnosis not present

## 2022-06-30 DIAGNOSIS — S72351A Displaced comminuted fracture of shaft of right femur, initial encounter for closed fracture: Secondary | ICD-10-CM | POA: Diagnosis not present

## 2022-06-30 DIAGNOSIS — Z833 Family history of diabetes mellitus: Secondary | ICD-10-CM

## 2022-06-30 DIAGNOSIS — I517 Cardiomegaly: Secondary | ICD-10-CM | POA: Diagnosis not present

## 2022-06-30 DIAGNOSIS — M6259 Muscle wasting and atrophy, not elsewhere classified, multiple sites: Secondary | ICD-10-CM | POA: Diagnosis not present

## 2022-06-30 DIAGNOSIS — I110 Hypertensive heart disease with heart failure: Secondary | ICD-10-CM | POA: Diagnosis not present

## 2022-06-30 DIAGNOSIS — R0902 Hypoxemia: Secondary | ICD-10-CM | POA: Diagnosis not present

## 2022-06-30 LAB — COMPREHENSIVE METABOLIC PANEL
ALT: 18 U/L (ref 0–44)
AST: 29 U/L (ref 15–41)
Albumin: 2.5 g/dL — ABNORMAL LOW (ref 3.5–5.0)
Alkaline Phosphatase: 147 U/L — ABNORMAL HIGH (ref 38–126)
Anion gap: 10 (ref 5–15)
BUN: 46 mg/dL — ABNORMAL HIGH (ref 8–23)
CO2: 21 mmol/L — ABNORMAL LOW (ref 22–32)
Calcium: 8.3 mg/dL — ABNORMAL LOW (ref 8.9–10.3)
Chloride: 106 mmol/L (ref 98–111)
Creatinine, Ser: 4.45 mg/dL — ABNORMAL HIGH (ref 0.44–1.00)
GFR, Estimated: 9 mL/min — ABNORMAL LOW (ref >60)
Glucose, Bld: 169 mg/dL — ABNORMAL HIGH (ref 70–99)
Potassium: 4.6 mmol/L (ref 3.5–5.1)
Sodium: 137 mmol/L (ref 135–145)
Total Bilirubin: 0.4 mg/dL (ref 0.3–1.2)
Total Protein: 6.1 g/dL — ABNORMAL LOW (ref 6.5–8.1)

## 2022-06-30 LAB — CBC WITH DIFFERENTIAL/PLATELET
Abs Immature Granulocytes: 0.05 10*3/uL (ref 0.00–0.07)
Basophils Absolute: 0 10*3/uL (ref 0.0–0.1)
Basophils Relative: 0 %
Eosinophils Absolute: 0.2 10*3/uL (ref 0.0–0.5)
Eosinophils Relative: 2 %
HCT: 25.4 % — ABNORMAL LOW (ref 36.0–46.0)
Hemoglobin: 8.3 g/dL — ABNORMAL LOW (ref 12.0–15.0)
Immature Granulocytes: 1 %
Lymphocytes Relative: 8 %
Lymphs Abs: 0.8 10*3/uL (ref 0.7–4.0)
MCH: 29.6 pg (ref 26.0–34.0)
MCHC: 32.7 g/dL (ref 30.0–36.0)
MCV: 90.7 fL (ref 80.0–100.0)
Monocytes Absolute: 0.8 10*3/uL (ref 0.1–1.0)
Monocytes Relative: 8 %
Neutro Abs: 8.3 10*3/uL — ABNORMAL HIGH (ref 1.7–7.7)
Neutrophils Relative %: 81 %
Platelets: 204 10*3/uL (ref 150–400)
RBC: 2.8 MIL/uL — ABNORMAL LOW (ref 3.87–5.11)
RDW: 16.3 % — ABNORMAL HIGH (ref 11.5–15.5)
WBC: 10.3 10*3/uL (ref 4.0–10.5)
nRBC: 0 % (ref 0.0–0.2)

## 2022-06-30 LAB — BRAIN NATRIURETIC PEPTIDE: B Natriuretic Peptide: 103.8 pg/mL — ABNORMAL HIGH (ref 0.0–100.0)

## 2022-06-30 LAB — HEPARIN LEVEL (UNFRACTIONATED): Heparin Unfractionated: 0.22 IU/mL — ABNORMAL LOW (ref 0.30–0.70)

## 2022-06-30 LAB — CBC
HCT: 23.2 % — ABNORMAL LOW (ref 36.0–46.0)
Hemoglobin: 7.5 g/dL — ABNORMAL LOW (ref 12.0–15.0)
MCH: 29.3 pg (ref 26.0–34.0)
MCHC: 32.3 g/dL (ref 30.0–36.0)
MCV: 90.6 fL (ref 80.0–100.0)
Platelets: 195 10*3/uL (ref 150–400)
RBC: 2.56 MIL/uL — ABNORMAL LOW (ref 3.87–5.11)
RDW: 16.3 % — ABNORMAL HIGH (ref 11.5–15.5)
WBC: 10.4 10*3/uL (ref 4.0–10.5)
nRBC: 0 % (ref 0.0–0.2)

## 2022-06-30 LAB — IRON AND TIBC
Iron: 20 ug/dL — ABNORMAL LOW (ref 28–170)
Saturation Ratios: 9 % — ABNORMAL LOW (ref 10.4–31.8)
TIBC: 216 ug/dL — ABNORMAL LOW (ref 250–450)
UIBC: 196 ug/dL

## 2022-06-30 LAB — D-DIMER, QUANTITATIVE: D-Dimer, Quant: 2.79 ug/mL-FEU — ABNORMAL HIGH (ref 0.00–0.50)

## 2022-06-30 LAB — PROTIME-INR
INR: 1.3 — ABNORMAL HIGH (ref 0.8–1.2)
Prothrombin Time: 15.9 seconds — ABNORMAL HIGH (ref 11.4–15.2)

## 2022-06-30 LAB — APTT: aPTT: 34 seconds (ref 24–36)

## 2022-06-30 LAB — FERRITIN: Ferritin: 111 ng/mL (ref 11–307)

## 2022-06-30 MED ORDER — HEPARIN SODIUM (PORCINE) 5000 UNIT/ML IJ SOLN: 5000.0000 [IU] | Freq: Three times a day (TID) | INTRAMUSCULAR | Status: DC

## 2022-06-30 MED ORDER — PANTOPRAZOLE SODIUM 40 MG PO TBEC
40.0000 mg | DELAYED_RELEASE_TABLET | Freq: Every day | ORAL | Status: DC
  Administered 2022-06-30: 40 mg via ORAL
  Filled 2022-06-30: qty 1

## 2022-06-30 MED ORDER — ALLOPURINOL 100 MG PO TABS
100.0000 mg | ORAL_TABLET | Freq: Two times a day (BID) | ORAL | Status: DC
  Administered 2022-06-30 – 2022-07-11 (×21): 100 mg via ORAL
  Filled 2022-06-30 (×21): qty 1

## 2022-06-30 MED ORDER — QUETIAPINE FUMARATE 25 MG PO TABS
25.0000 mg | ORAL_TABLET | Freq: Every day | ORAL | Status: DC
  Administered 2022-06-30 – 2022-07-10 (×11): 25 mg via ORAL
  Filled 2022-06-30 (×11): qty 1

## 2022-06-30 MED ORDER — GABAPENTIN 100 MG PO CAPS
100.0000 mg | ORAL_CAPSULE | Freq: Two times a day (BID) | ORAL | Status: DC
  Administered 2022-06-30 – 2022-07-01 (×3): 100 mg via ORAL
  Filled 2022-06-30 (×3): qty 1

## 2022-06-30 MED ORDER — HEPARIN SODIUM (PORCINE) 5000 UNIT/ML IJ SOLN
5000.0000 [IU] | Freq: Three times a day (TID) | INTRAMUSCULAR | Status: AC
  Administered 2022-07-01 – 2022-07-02 (×6): 5000 [IU] via SUBCUTANEOUS
  Filled 2022-06-30 (×7): qty 1

## 2022-06-30 MED ORDER — HEPARIN (PORCINE) 25000 UT/250ML-% IV SOLN: 1000.0000 [IU]/h | INTRAVENOUS | Status: DC

## 2022-06-30 MED ORDER — METHOCARBAMOL 1000 MG/10ML IJ SOLN
500.0000 mg | Freq: Four times a day (QID) | INTRAVENOUS | Status: DC | PRN
  Administered 2022-07-04: 500 mg via INTRAVENOUS
  Filled 2022-06-30: qty 500

## 2022-06-30 MED ORDER — INSULIN ASPART 100 UNIT/ML IJ SOLN
0.0000 [IU] | Freq: Three times a day (TID) | INTRAMUSCULAR | Status: DC
  Administered 2022-07-01: 1 [IU] via SUBCUTANEOUS
  Administered 2022-07-02: 2 [IU] via SUBCUTANEOUS
  Administered 2022-07-02: 1 [IU] via SUBCUTANEOUS
  Administered 2022-07-02 – 2022-07-04 (×4): 2 [IU] via SUBCUTANEOUS
  Administered 2022-07-04 – 2022-07-05 (×4): 1 [IU] via SUBCUTANEOUS
  Administered 2022-07-06: 2 [IU] via SUBCUTANEOUS
  Administered 2022-07-06: 1 [IU] via SUBCUTANEOUS
  Administered 2022-07-07: 2 [IU] via SUBCUTANEOUS
  Administered 2022-07-07: 1 [IU] via SUBCUTANEOUS
  Administered 2022-07-08 – 2022-07-09 (×3): 2 [IU] via SUBCUTANEOUS
  Administered 2022-07-09: 1 [IU] via SUBCUTANEOUS
  Administered 2022-07-09: 2 [IU] via SUBCUTANEOUS
  Administered 2022-07-10: 1 [IU] via SUBCUTANEOUS
  Administered 2022-07-10 (×2): 2 [IU] via SUBCUTANEOUS
  Administered 2022-07-11: 1 [IU] via SUBCUTANEOUS

## 2022-06-30 MED ORDER — PANTOPRAZOLE SODIUM 40 MG IV SOLR
40.0000 mg | INTRAVENOUS | Status: DC
  Administered 2022-07-01 – 2022-07-04 (×5): 40 mg via INTRAVENOUS
  Filled 2022-06-30 (×5): qty 10

## 2022-06-30 MED ORDER — METHOCARBAMOL 500 MG PO TABS
500.0000 mg | ORAL_TABLET | Freq: Four times a day (QID) | ORAL | Status: DC | PRN
  Administered 2022-07-01 – 2022-07-11 (×15): 500 mg via ORAL
  Filled 2022-06-30 (×17): qty 1

## 2022-06-30 MED ORDER — METOPROLOL TARTRATE 50 MG PO TABS: 100.0000 mg | ORAL_TABLET | Freq: Two times a day (BID) | ORAL | Status: DC

## 2022-06-30 MED ORDER — MIRTAZAPINE 15 MG PO TABS
15.0000 mg | ORAL_TABLET | Freq: Every day | ORAL | Status: DC
  Administered 2022-06-30 – 2022-07-10 (×11): 15 mg via ORAL
  Filled 2022-06-30 (×11): qty 1

## 2022-06-30 MED ORDER — ADULT MULTIVITAMIN W/MINERALS CH
1.0000 | ORAL_TABLET | Freq: Every day | ORAL | Status: DC
  Administered 2022-07-01: 1 via ORAL
  Filled 2022-06-30: qty 1

## 2022-06-30 MED ORDER — ACETAMINOPHEN 325 MG PO TABS
650.0000 mg | ORAL_TABLET | Freq: Four times a day (QID) | ORAL | Status: DC | PRN
  Administered 2022-07-01: 650 mg via ORAL
  Filled 2022-06-30: qty 2

## 2022-06-30 MED ORDER — SODIUM CHLORIDE 0.9 % IV SOLN: INTRAVENOUS | Status: AC

## 2022-06-30 MED ORDER — OXYCODONE HCL 5 MG PO TABS
5.0000 mg | ORAL_TABLET | Freq: Four times a day (QID) | ORAL | Status: DC | PRN
  Administered 2022-07-01 – 2022-07-11 (×20): 5 mg via ORAL
  Filled 2022-06-30 (×21): qty 1

## 2022-06-30 MED ORDER — AMLODIPINE BESYLATE 10 MG PO TABS
10.0000 mg | ORAL_TABLET | Freq: Every day | ORAL | Status: DC
  Administered 2022-06-30: 10 mg via ORAL
  Filled 2022-06-30: qty 1

## 2022-06-30 MED ORDER — LEVETIRACETAM 500 MG PO TABS
500.0000 mg | ORAL_TABLET | Freq: Every morning | ORAL | Status: DC
  Administered 2022-06-30 – 2022-07-11 (×11): 500 mg via ORAL
  Filled 2022-06-30 (×13): qty 1

## 2022-06-30 NOTE — Assessment & Plan Note (Signed)
A1C of 6.4 in 02/2022 SSI and accuchecks qac/hs

## 2022-06-30 NOTE — Assessment & Plan Note (Signed)
Per daughter was due to uncontrolled HTN Seizure free x 17 years Continue keppra and seizure precautions

## 2022-06-30 NOTE — Assessment & Plan Note (Addendum)
Echo 02/2022 with normal EF and grade 2 DD Appears more dry on exam BNP wnl Strict I/O

## 2022-06-30 NOTE — H&P (Signed)
History and Physical    Patient: [name] WNI:[id number] DOB: 1929-02-17 DOA: 06/30/2022 DOS: the patient was seen and examined on 06/30/2022 PCP: Seward Carol, MD  Patient coming from:  [facility] in [place]   - lives with her daughter at home. Ambulates independently. Has a walker, but doesn't use this.    Chief Complaint: right femur fracture   HPI: [name] is a 88 y.o. female with medical history significant of HTN, T2DM, stage IV CKD, dementia, diastolic CHF, GERD who was on vacation with her daughter in [place] when she suffered a mechanical fall.  Wednesday night she went out to her balcony and missed a step and fell down. Her daughter found her on her back with her right leg shortened and turned outwards. She was taken to the hospital and then transferred to a trauma hospital on Thursday.  The ortho surgeons in [place] did say it was complex fracture and didn't think it was safe to travel home. She has been on lovenox and last dose given at 9:30AM. (Prophylactic dose). Her daughter medi vaced her out today and travel time was 3 hours and 6 minutes. There is no paper chart.   Daughter states she has been doing well. Did have a mild cough she was treating with mucinex. No fever/chills,  vision changes/headaches, chest pain or palpitations, shortness of breath or cough, abdominal pain, N/V/D, dysuria or leg swelling.    She does not smoke or drink alcohol.   Transfer course Vitals: afebrile, bp: 137/59, HR: 91, RR: 18, oxygen: 86%RA>95%[place] on 3L No transferring records.   Per accepting physician:   Came to vacation with her daughter and staying in a hotel but she tripped en route to the balcony and landed on her knee on 11/27.  R distal femur periprosthetic fracture.  Hemodynamically stable.  Daughter is NP in [place], refuses surgical intervention there.  Daughter called ortho and spoke with Dr. Ninfa Linden, willing to operate.  In knee immobilizer currently.  Geriatrician  did see her, cleared for surgery.  Daughter booked an air ambulance and will be there within 2-3 hours and taking her straight to [facility], likely to arrive in 7-8 hours.  Will accept to Med Surg.   Daughter is Felise Georgia, [phone].    I spoke with Dr. Ninfa Linden, who is happy to operate or arrange for a specialist she can.   I have communicated with patient placement and they will arrange a bed for her.  I have also provided the air ambulance coordinator telephone information to [place].   Finally, I called her daughter and notified her.   Review of Systems: As mentioned in the history of present illness. All other systems reviewed and are negative. Past Medical History:  Diagnosis Date   Dementia ([place])    Diabetes mellitus    GERD (gastroesophageal reflux disease)    Glaucoma    Heart disease    Hypertension    Tinnitus    Past Surgical History:  Procedure Laterality Date   BREAST LUMPECTOMY     bil for breast ca   Social History:  reports that she has quit smoking. She has never used smokeless tobacco. She reports that she does not drink alcohol and does not use drugs.  Allergies  Allergen Reactions   Ace Inhibitors Swelling, Rash and Cough   Prandin [Repaglinide] Other (See Comments)    Caused significant peripheral edema   Dilaudid [Hydromorphone Hcl] Itching and Other (See Comments)  Hallucinations (auditory and visual) also   Other Nausea And Vomiting    "Tussionex Pennkinetic ER"   Latex Rash   Tape Rash    Prefers PAPER TAPE, PLEASE!!    Family History  Problem Relation Age of Onset   Diabetes Sister     Prior to Admission medications   Medication Sig Start Date End Date Taking? Authorizing Provider  albuterol (VENTOLIN HFA) 108 (90 Base) MCG/ACT inhaler Inhale 2 puffs into the lungs every 6 (six) hours as needed for wheezing or shortness of breath. 03/16/22   Caren Griffins, MD  allopurinol (ZYLOPRIM) 100 MG tablet Take 100 mg by mouth 2 (two) times daily.     [provider]  amLODipine (NORVASC) 10 MG tablet Take 10 mg by mouth daily.    [provider]  augmented betamethasone dipropionate (DIPROLENE-AF) 0.05 % cream Apply 1 application  topically daily as needed for dry skin (affected areas). 10/06/20   [provider]  blood glucose meter kit and supplies KIT Dispense based on patient and insurance preference. Use up to four times daily as directed. (FOR ICD-9 250.00, 250.01). For QAC - HS accuchecks. 11/27/20   Thurnell Lose, MD  Continuous Blood Gluc Sensor (FREESTYLE LIBRE 2 SENSOR) MISC USE AS DIRECTED. REAPPLY EVERY 14 DAYS Patient taking differently: Inject 1 Device into the skin every 14 (fourteen) days. 11/20/21   Renato Shin, MD  furosemide (LASIX) 40 MG tablet Take 40 mg by mouth in the morning.    [provider]  gabapentin (NEURONTIN) 100 MG capsule Take 100 mg by mouth 2 (two) times daily.    [provider]  guaiFENesin-dextromethorphan (ROBITUSSIN DM) 100-10 MG/5ML syrup Take 10 mLs by mouth every 4 (four) hours as needed for cough. 03/16/22   Caren Griffins, MD  hydrALAZINE (APRESOLINE) 100 MG tablet Take 1 tablet (100 mg total) by mouth every 8 (eight) hours. Patient taking differently: Take 100 mg by mouth 3 (three) times daily. 11/27/20   Thurnell Lose, MD  Insulin Syringe-Needle U-100 25G X 1" 1 ML MISC For 4 times a day insulin SQ, 1 month supply. Diagnosis E11.65 11/27/20   Thurnell Lose, MD  levETIRAcetam (KEPPRA) 500 MG tablet Take 500 mg by mouth in the morning.    [provider]  loratadine (CLARITIN) 10 MG tablet Take 10 mg by mouth daily as needed for allergies.    [provider]  metoprolol tartrate (LOPRESSOR) 100 MG tablet Take 100 mg by mouth 2 (two) times daily. 08/03/20   [provider]  mirtazapine (REMERON) 15 MG tablet Take 15 mg by mouth at bedtime.     [provider]  Multiple Vitamins-Minerals (CENTRUM SILVER 50+WOMEN)  TABS Take 1 tablet by mouth daily with breakfast.    [provider]  NOVOLOG FLEXPEN 100 UNIT/ML FlexPen Inject into the skin See admin instructions. Inject as directed into the skin three times a day with meals, per sliding scale and ONLY if the BGL is 200 or greater    [provider]  omeprazole (PRILOSEC) 20 MG capsule Take 20 mg by mouth every Monday, Wednesday, and Friday.    [provider]  QUEtiapine (SEROQUEL) 25 MG tablet Take 25 mg by mouth at bedtime. 12/01/20   [provider]    Physical Exam: Vitals:   06/30/22 1700 06/30/22 1740 06/30/22 2126  BP: (!) 137/59  (!) 150/57  Pulse: 91    Resp: 18    Temp: 99.4  F (37.4 C)    TempSrc: Oral    SpO2: (!) 86% 95%   Weight: 59.1 kg    Height: _0  (1.549 m)     General:  Appears calm and comfortable and is in NAD Eyes:  PERRL, EOMI, normal lids, iris ENT:  grossly normal hearing, lips & tongue, mmm; dentures Neck:  no LAD, masses or thyromegaly; no carotid bruits Cardiovascular:  RRR, no m/r/g. No LE edema.  Respiratory:   CTA bilaterally with no wheezes/rales/rhonchi.  Normal respiratory effort. Abdomen:  soft, NT, ND, NABS Back:   normal alignment, no CVAT Skin:  no rash or induration seen on limited exam Musculoskeletal: right leg: mildly shortened with slight abduction. Sensation intact. Extension and flexion of foot intact. Pedal pulses intact. In knee immobilizer. No bruises/fractures. No LE edema.  Lower extremity:  No LE edema.  Limited foot exam with no ulcerations.  2+ distal pulses. Psychiatric:  grossly normal mood and affect, speech fluent and appropriate, AOx3 Neurologic:  CN 2-12 grossly intact, moves all extremities in coordinated fashion, sensation intact   Radiological Exams on Admission: Independently reviewed - see discussion in A/P where applicable  DG HIP UNILAT WITH PELVIS 1V RIGHT  Result Date: 06/30/2022 CLINICAL DATA:  Closed fracture distal femur.  544527.  EXAM: DG HIP (WITH OR WITHOUT PELVIS) 1V RIGHT; RIGHT KNEE - 1-2 VIEW COMPARISON:  Immediate postoperative films following right knee replacement 11/24/2008, abdomen film including the pelvis and hips on 10/13/2020. FINDINGS: AP pelvis and separate AP right hip view: Osteopenia. No displaced fracture is seen of the pelvis and proximal femurs. Symmetric joint space loss is again noted both superior hips, small acetabular osteophytes and mild pelvic enthesopathy with spurring at the SI joints. Intestinal postsurgical changes are noted in the left lower abdomen, as before. Single surgical clip at the level of the mid right hemisacrum. 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 Right knee AP Lat only, portable: Osteopenia. 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 There is a markedly comminuted distal femoral shaft fracture extending to the metaphyseal bone alongside the hardware. There is a mild spreading of the comminution fragments, the main distal fragment posteriorly displaced about 1/2 of a shaft width, laterally displaced about 1/3 of a shaft width with slight apex dorsal angulation and a slight valgus angulation. There are calcifications in the femoral, popliteal and popliteal trifurcation arteries. There is no further evidence of fractures. IMPRESSION: 1. Osteopenia and degenerative change without evidence of displaced fracture of the pelvis and proximal femurs. 2. Markedly comminuted distal femoral shaft fracture extending to the metaphyseal bone alongside the TKA hardware, with a mild spreading of the comminution fragments, the main distal fragment posteriorly displaced about 1/2 of a shaft width, laterally displaced 1/3 of a shaft width with slight apex dorsal and valgus angulation. Electronically Signed   By: Telford Nab M.D.   On: 06/30/2022 20:54   DG Knee 1-2 Views Right  Result Date: 06/30/2022 CLINICAL DATA:  Closed fracture distal femur.  [id number]. EXAM:  DG HIP (WITH OR WITHOUT PELVIS) 1V RIGHT; RIGHT KNEE - 1-2 VIEW COMPARISON:  Immediate postoperative films following right knee replacement 11/24/2008, abdomen film including the pelvis and hips on 10/13/2020. FINDINGS: AP pelvis and separate AP right hip view: Osteopenia. No displaced fracture is seen of the pelvis and proximal femurs. Symmetric joint space loss is again noted both superior hips, small acetabular osteophytes and mild pelvic enthesopathy with spurring at the SI joints. Intestinal postsurgical changes  are noted in the left lower abdomen, as before. Single surgical clip at the level of the mid right hemisacrum. 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 Right knee AP Lat only, portable: Osteopenia. 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 There is a markedly comminuted distal femoral shaft fracture extending to the metaphyseal bone alongside the hardware. There is a mild spreading of the comminution fragments, the main distal fragment posteriorly displaced about 1/2 of a shaft width, laterally displaced about 1/3 of a shaft width with slight apex dorsal angulation and a slight valgus angulation. There are calcifications in the femoral, popliteal and popliteal trifurcation arteries. There is no further evidence of fractures. IMPRESSION: 1. Osteopenia and degenerative change without evidence of displaced fracture of the pelvis and proximal femurs. 2. Markedly comminuted distal femoral shaft fracture extending to the metaphyseal bone alongside the TKA hardware, with a mild spreading of the comminution fragments, the main distal fragment posteriorly displaced about 1/2 of a shaft width, laterally displaced 1/3 of a shaft width with slight apex dorsal and valgus angulation. Electronically Signed   By: Telford Nab M.D.   On: 06/30/2022 20:54   DG CHEST PORT 1 VIEW  Result Date: 06/30/2022 CLINICAL DATA:  Hypoxia EXAM: PORTABLE CHEST 1 VIEW COMPARISON:   Chest x-ray 03/10/2022 FINDINGS: Heart is enlarged, unchanged. There is some scattered interstitial opacities which may be chronic. There is no focal consolidation, pleural effusion or pneumothorax. Left axillary surgical clips are present. There are atherosclerotic calcifications of the aorta. There severe degenerative changes of both shoulders. IMPRESSION: 1. Cardiomegaly. 2. Scattered interstitial opacities which may be chronic. No focal consolidation. Electronically Signed   By: Ronney Asters M.D.   On: 06/30/2022 19:21    EKG: pending    Labs on Admission: I have personally reviewed the available labs and imaging studies at the time of the admission.  Pertinent labs:   Ordered at time of admit/pending  Assessment and Plan: Principal Problem:   Closed fracture of right distal femur ([place]) Active Problems:   Acute respiratory failure with hypoxia (HCC)   Acute renal failure superimposed on stage 4 chronic kidney disease (HCC)   Acute on chronic anemia   Chronic diastolic CHF (congestive heart failure) (HCC)   Type 2 diabetes mellitus with complication, without long-term current use of insulin (HCC)   Hypertension   GERD (gastroesophageal reflux disease)   history of seizures   Dementia (HCC)    Assessment and Plan: * Closed fracture of right distal femur ([place]) 88 year old female who suffered a mechanical fall on Wednesday night in [place] with subsequent right complex distal femur fracture. She was air evacuated to [place] and arrived today.  -admit to telemetry -ortho consulted, Dr. Ninfa Linden. Will be discussing with ortho trauma -hip fracture/femur order set utilized -pain has been well controlled. She is allergic to dilaudid and daughter tries to avoid opioids. Have written for tylenol and then oxycodone prn. Avoiding morphine with stage IV CKD  -heparin [place]  -okay with diet, no surgery planned for tomorrow at this time   Acute respiratory failure with hypoxia  (HCC) Hemodynamically stable on transfer request Initial vitals show her oxygen to 86% on RA with HR of >90 She has been on prophylactic lovenox BID and last dose was this AM She has no increased RR, swelling in legs  Stat CXR:  Cardiomegaly with scattered interstitial opacities which may be chronic. No focal consolidation. BNP pending, but not clinically overloaded appearing  -  d-dimer pending, but will likely be high with CKD and trauma. With fracture on Wednesday/immobilization and air travel at high risk for embolism. VQ scan will be ordered and heparin started if d-dimer elevated until we can rule out.  Wean as tolerated     Acute renal failure superimposed on stage 4 chronic kidney disease ([place]) Baseline creatinine of 1.6 now 4.45 No labs from transferring facility Appears dry on exam  -UA/urine studies -renal US in 02/2022 with medical renal disease -avoid nephrotoxic drugs  -gentle, time limited IVF overnight at 75cc/12 hours -asked nurse to do bladder scan to make sure no retention  -Avoiding morphine for pain  -secure chat to nephrology, touch base tomorrow  -trend   Acute on chronic anemia Baseline hgb appears to be around 9-10 and likely from CKD Iron studies pending Hgb on admit was 8.3 from 9.8 three months ago. Family denies any bleeding Trend cbc q 6 hours. Repeat 7.5: held heparin gtt and changed to [place] VTE -fecal occult -trend -transfuse if hgb <7  Chronic diastolic CHF (congestive heart failure) ([place]) Echo 02/2022 with normal EF and grade 2 DD Appears more dry on exam BNP wnl Strict I/O   Type 2 diabetes mellitus with complication, without long-term current use of insulin (HCC) A1C of 6.4 in 02/2022 SSI and accuchecks qac/hs    Hypertension Blood pressure has been normal to soft with evening reading 120/54 Holding meds at this time PRN lopressor with parameters   GERD (gastroesophageal reflux disease) Continue protonix 57m IV  history of seizures Per  daughter was due to uncontrolled HTN Seizure free x 17 years Continue keppra and seizure precautions   Dementia ([place] Delirium precautions  Continue remeron and seroquel QHS    Advance Care Planning:   Code Status: Full Code   Consults: orthopedics   DVT Prophylaxis: heparin [place]   Family Communication: daughters at bedside   Severity of Illness: The appropriate patient status for this patient is INPATIENT. Inpatient status is judged to be reasonable and necessary in order to provide the required intensity of service to ensure the patient's safety. The patient's presenting symptoms, physical exam findings, and initial radiographic and laboratory data in the context of their chronic comorbidities is felt to place them at high risk for further clinical deterioration. Furthermore, it is not anticipated that the patient will be medically stable for discharge from the hospital within 2 midnights of admission.   * I certify that at the point of admission it is my clinical judgment that the patient will require inpatient hospital care spanning beyond 2 midnights from the point of admission due to high intensity of service, high risk for further deterioration and high frequency of surveillance required.*  Author: AOrma Flaming MD 06/30/2022 10:20 PM  For on call review [email or web address]

## 2022-06-30 NOTE — Assessment & Plan Note (Signed)
Delirium precautions  Continue remeron and seroquel QHS

## 2022-06-30 NOTE — Assessment & Plan Note (Addendum)
Baseline creatinine of 1.6 now 4.45 No labs from transferring facility Appears dry on exam  -UA/urine studies -renal US in 02/2022 with medical renal disease -avoid nephrotoxic drugs  -gentle, time limited IVF overnight at 75cc/12 hours -asked nurse to do bladder scan to make sure no retention  -Avoiding morphine for pain  -secure chat to nephrology, touch base tomorrow  -trend

## 2022-06-30 NOTE — Assessment & Plan Note (Signed)
Baseline hgb appears to be around 9-10 and likely from CKD Iron studies pending Hgb on admit was 8.3 from 9.8 three months ago. Family denies any bleeding Trend cbc q 6 hours. Repeat 7.5: held heparin gtt and changed to [place] VTE -fecal occult -trend -transfuse if hgb <7

## 2022-06-30 NOTE — Progress Notes (Signed)
Patient ID: JAMILIA JACQUES, female   DOB: 1929/05/02, 88 y.o.   MRN: [id number] I was able to come by the bedside and see the patient and speak directly with her daughter at the bedside.  The patient is a 88 year old female who has a history of a right total knee arthroplasty placed by one of my partners Dr. Durward Fortes many years ago.  This past Wednesday while vacationing in [place], the patient sustained a mechanical fall injuring her right lower extremity.  She is found to have a periprosthetic fracture just proximal to her right knee replacement.  Her daughter was able to arrange a Occupational psychologist from [place] to [place] today and she arrived to [facility] this evening.  Her skin is intact and her right lower extremity is well-perfused.  I was able to hold traction on her leg to get good x-rays of the femur.  She does have a complex comminuted distal femur fracture.  I believe that the total knee arthroplasty is intact and stable.  However, this is a highly complex fracture and I will need to consult fellowship trained orthopedic trauma specialists to evaluate and treat this injury in light of its complexity.  Fracture such as this are difficult and usually beyond my level of expertise as it relates to the complex nature of the this injury.  The patient's daughter totally understands this and understands that I will reach out to our orthopedic trauma specialist for further evaluation and treatment of this complex injury.

## 2022-06-30 NOTE — Assessment & Plan Note (Addendum)
Continue protonix '40mg'$  IV

## 2022-06-30 NOTE — Assessment & Plan Note (Signed)
88 year old female who suffered a mechanical fall on Wednesday night in [place] with subsequent right complex distal femur fracture. She was air evacuated to [place] and arrived today.  -admit to telemetry -ortho consulted, Dr. Ninfa Linden. Will be discussing with ortho trauma -hip fracture/femur order set utilized -pain has been well controlled. She is allergic to dilaudid and daughter tries to avoid opioids. Have written for tylenol and then oxycodone prn. Avoiding morphine with stage IV CKD  -heparin [place]  -okay with diet, no surgery planned for tomorrow at this time

## 2022-06-30 NOTE — Assessment & Plan Note (Addendum)
Hemodynamically stable on transfer request Initial vitals show her oxygen to 86% on RA with HR of >90 She has been on prophylactic lovenox BID and last dose was this AM She has no increased RR, swelling in legs  Stat CXR:  Cardiomegaly with scattered interstitial opacities which may be chronic. No focal consolidation. BNP pending, but not clinically overloaded appearing  -d-dimer pending, but will likely be high with CKD and trauma. With fracture on Wednesday/immobilization and air travel at high risk for embolism. VQ scan will be ordered and heparin started if d-dimer elevated until we can rule out.  Wean as tolerated

## 2022-06-30 NOTE — Assessment & Plan Note (Signed)
Blood pressure has been normal to soft with evening reading 120/54 Holding meds at this time PRN lopressor with parameters

## 2022-06-30 NOTE — Progress Notes (Addendum)
ANTICOAGULATION CONSULT NOTE - Initial Consult  Pharmacy Consult for heparin Indication: pulmonary embolus  Allergies  Allergen Reactions   Ace Inhibitors Swelling, Rash and Cough   Prandin [Repaglinide] Other (See Comments)    Caused significant peripheral edema   Dilaudid [Hydromorphone Hcl] Itching and Other (See Comments)    Hallucinations (auditory and visual) also   Other Nausea And Vomiting    "Tussionex Pennkinetic ER"   Latex Rash   Tape Rash    Prefers PAPER TAPE, PLEASE!!    Patient Measurements: Height: _0  (154.9 cm) Weight: 59.1 kg (130 lb 4.7 oz) IBW/kg (Calculated) : 47.8 Heparin Dosing Weight: 59.1 kg  Vital Signs: Temp: 99.4 F (37.4 C) (12/02 1700) Temp Source: Oral (12/02 1700) BP: 137/59 (12/02 1700) Pulse Rate: 91 (12/02 1700)  Labs: Recent Labs    06/30/22 1906  HGB 8.3*  HCT 25.4*  PLT 204  LABPROT 15.9*  INR 1.3*  CREATININE 4.45*    Estimated Creatinine Clearance: 6.8 mL/min (A) (by C-G formula based on SCr of 4.45 mg/dL (H)).   Medical History: Past Medical History:  Diagnosis Date   Dementia ([place])    Diabetes mellitus    GERD (gastroesophageal reflux disease)    Glaucoma    Heart disease    Hypertension    Tinnitus     Medications:  Medications Prior to Admission  Medication Sig Dispense Refill Last Dose   albuterol (VENTOLIN HFA) 108 (90 Base) MCG/ACT inhaler Inhale 2 puffs into the lungs every 6 (six) hours as needed for wheezing or shortness of breath. 1 each 0    allopurinol (ZYLOPRIM) 100 MG tablet Take 100 mg by mouth 2 (two) times daily.      amLODipine (NORVASC) 10 MG tablet Take 10 mg by mouth daily.      augmented betamethasone dipropionate (DIPROLENE-AF) 0.05 % cream Apply 1 application  topically daily as needed for dry skin (affected areas).      blood glucose meter kit and supplies KIT Dispense based on patient and insurance preference. Use up to four times daily as directed. (FOR ICD-9 250.00, 250.01). For  QAC - HS accuchecks. 1 each 1    Continuous Blood Gluc Sensor (FREESTYLE LIBRE 2 SENSOR) MISC USE AS DIRECTED. REAPPLY EVERY 14 DAYS (Patient taking differently: Inject 1 Device into the skin every 14 (fourteen) days.) 6 each 3    gabapentin (NEURONTIN) 100 MG capsule Take 100 mg by mouth 2 (two) times daily.      guaiFENesin-dextromethorphan (ROBITUSSIN DM) 100-10 MG/5ML syrup Take 10 mLs by mouth every 4 (four) hours as needed for cough. 118 mL 0    Insulin Syringe-Needle U-100 25G X 1" 1 ML MISC For 4 times a day insulin SQ, 1 month supply. Diagnosis E11.65 30 each 0    levETIRAcetam (KEPPRA) 500 MG tablet Take 500 mg by mouth in the morning.      loratadine (CLARITIN) 10 MG tablet Take 10 mg by mouth daily as needed for allergies.      metoprolol tartrate (LOPRESSOR) 100 MG tablet Take 100 mg by mouth 2 (two) times daily.      mirtazapine (REMERON) 15 MG tablet Take 15 mg by mouth at bedtime.       Multiple Vitamins-Minerals (CENTRUM SILVER 50+WOMEN) TABS Take 1 tablet by mouth daily with breakfast.      NOVOLOG FLEXPEN 100 UNIT/ML FlexPen Inject into the skin See admin instructions. Inject as directed into the skin three times a day with meals, per  sliding scale and ONLY if the BGL is 200 or greater      omeprazole (PRILOSEC) 20 MG capsule Take 20 mg by mouth every Monday, Wednesday, and Friday.      QUEtiapine (SEROQUEL) 25 MG tablet Take 25 mg by mouth at bedtime.       Assessment: [name] is a 88 y.o. female here after a mechanical fall. Was medivaced from [place] where she received a dose of Lovenox prior to her flight here. Per H&P, last dose of Lovenox was at 0930 this morning. Baseline INR 1.3. Hgb low 8.3. D-dimer elevated 2.79.  Goal of Therapy:  Heparin level 0.3-0.7 units/ml Monitor platelets by anticoagulation protocol: Yes   Plan:  Baseline heparin level/aPTT Start heparin infusion at 1000 units/hr (12 hours after last dose of Lovenox) Check anti-Xa level in 8 hours  and daily while on heparin Continue to monitor H&H and platelets F/u VQ scan    Thank you for allowing [place] to participate in this patients care. Jens Som, PharmD 06/30/2022 8:59 PM  **Pharmacist phone directory can be found on [email or web address] listed under [place]**

## 2022-07-01 ENCOUNTER — Inpatient Hospital Stay (HOSPITAL_COMMUNITY): Payer: PPO

## 2022-07-01 DIAGNOSIS — E118 Type 2 diabetes mellitus with unspecified complications: Secondary | ICD-10-CM

## 2022-07-01 DIAGNOSIS — N179 Acute kidney failure, unspecified: Secondary | ICD-10-CM | POA: Diagnosis not present

## 2022-07-01 DIAGNOSIS — K219 Gastro-esophageal reflux disease without esophagitis: Secondary | ICD-10-CM

## 2022-07-01 DIAGNOSIS — I10 Essential (primary) hypertension: Secondary | ICD-10-CM

## 2022-07-01 DIAGNOSIS — S72491A Other fracture of lower end of right femur, initial encounter for closed fracture: Secondary | ICD-10-CM | POA: Diagnosis not present

## 2022-07-01 DIAGNOSIS — R569 Unspecified convulsions: Secondary | ICD-10-CM

## 2022-07-01 DIAGNOSIS — D649 Anemia, unspecified: Secondary | ICD-10-CM | POA: Diagnosis not present

## 2022-07-01 DIAGNOSIS — J9601 Acute respiratory failure with hypoxia: Secondary | ICD-10-CM

## 2022-07-01 LAB — GLUCOSE, CAPILLARY
Glucose-Capillary: 111 mg/dL — ABNORMAL HIGH (ref 70–99)
Glucose-Capillary: 96 mg/dL (ref 70–99)
Glucose-Capillary: 99 mg/dL (ref 70–99)
Glucose-Capillary: 141 mg/dL — ABNORMAL HIGH (ref 70–99)
Glucose-Capillary: 134 mg/dL — ABNORMAL HIGH (ref 70–99)

## 2022-07-01 LAB — SODIUM, URINE, RANDOM: Sodium, Ur: 67 mmol/L

## 2022-07-01 LAB — URINALYSIS, ROUTINE W REFLEX MICROSCOPIC
Bilirubin Urine: NEGATIVE
Glucose, UA: 50 mg/dL — AB
Ketones, ur: NEGATIVE mg/dL
Nitrite: NEGATIVE
Protein, ur: >=300 mg/dL — AB
Specific Gravity, Urine: 1.013 (ref 1.005–1.030)
pH: 5 (ref 5.0–8.0)

## 2022-07-01 LAB — CBC
HCT: 23.1 % — ABNORMAL LOW (ref 36.0–46.0)
HCT: 23.5 % — ABNORMAL LOW (ref 36.0–46.0)
HCT: 22.2 % — ABNORMAL LOW (ref 36.0–46.0)
Hemoglobin: 7.2 g/dL — ABNORMAL LOW (ref 12.0–15.0)
Hemoglobin: 7.5 g/dL — ABNORMAL LOW (ref 12.0–15.0)
Hemoglobin: 6.9 g/dL — CL (ref 12.0–15.0)
MCH: 28.9 pg (ref 26.0–34.0)
MCH: 29.5 pg (ref 26.0–34.0)
MCH: 29.1 pg (ref 26.0–34.0)
MCHC: 31.2 g/dL (ref 30.0–36.0)
MCHC: 31.9 g/dL (ref 30.0–36.0)
MCHC: 31.1 g/dL (ref 30.0–36.0)
MCV: 92.8 fL (ref 80.0–100.0)
MCV: 92.5 fL (ref 80.0–100.0)
MCV: 93.7 fL (ref 80.0–100.0)
Platelets: 210 10*3/uL (ref 150–400)
Platelets: 196 10*3/uL (ref 150–400)
Platelets: 188 10*3/uL (ref 150–400)
RBC: 2.49 MIL/uL — ABNORMAL LOW (ref 3.87–5.11)
RBC: 2.54 MIL/uL — ABNORMAL LOW (ref 3.87–5.11)
RBC: 2.37 MIL/uL — ABNORMAL LOW (ref 3.87–5.11)
RDW: 16.4 % — ABNORMAL HIGH (ref 11.5–15.5)
RDW: 16.4 % — ABNORMAL HIGH (ref 11.5–15.5)
RDW: 16.2 % — ABNORMAL HIGH (ref 11.5–15.5)
WBC: 9.8 10*3/uL (ref 4.0–10.5)
WBC: 9 10*3/uL (ref 4.0–10.5)
WBC: 8.1 10*3/uL (ref 4.0–10.5)
nRBC: 0 % (ref 0.0–0.2)
nRBC: 0 % (ref 0.0–0.2)
nRBC: 0 % (ref 0.0–0.2)

## 2022-07-01 LAB — BASIC METABOLIC PANEL
Anion gap: 11 (ref 5–15)
BUN: 49 mg/dL — ABNORMAL HIGH (ref 8–23)
CO2: 18 mmol/L — ABNORMAL LOW (ref 22–32)
Calcium: 8.1 mg/dL — ABNORMAL LOW (ref 8.9–10.3)
Chloride: 107 mmol/L (ref 98–111)
Creatinine, Ser: 4.69 mg/dL — ABNORMAL HIGH (ref 0.44–1.00)
GFR, Estimated: 8 mL/min — ABNORMAL LOW (ref >60)
Glucose, Bld: 117 mg/dL — ABNORMAL HIGH (ref 70–99)
Potassium: 4.9 mmol/L (ref 3.5–5.1)
Sodium: 136 mmol/L (ref 135–145)

## 2022-07-01 LAB — PREPARE RBC (CROSSMATCH)

## 2022-07-01 LAB — CREATININE, URINE, RANDOM: Creatinine, Urine: 119 mg/dL

## 2022-07-01 MED ORDER — SODIUM CHLORIDE 0.9 % IV SOLN: INTRAVENOUS | Status: DC

## 2022-07-01 MED ORDER — NEPRO/CARBSTEADY PO LIQD
237.0000 mL | Freq: Two times a day (BID) | ORAL | Status: DC
  Administered 2022-07-01 – 2022-07-10 (×14): 237 mL via ORAL

## 2022-07-01 MED ORDER — DIPHENHYDRAMINE HCL 25 MG PO CAPS
25.0000 mg | ORAL_CAPSULE | Freq: Once | ORAL | Status: AC
  Administered 2022-07-01: 25 mg via ORAL
  Filled 2022-07-01: qty 1

## 2022-07-01 MED ORDER — ACETAMINOPHEN 325 MG PO TABS
650.0000 mg | ORAL_TABLET | Freq: Four times a day (QID) | ORAL | Status: DC
  Administered 2022-07-01 – 2022-07-04 (×9): 650 mg via ORAL
  Filled 2022-07-01 (×10): qty 2

## 2022-07-01 MED ORDER — RENA-VITE PO TABS
1.0000 | ORAL_TABLET | Freq: Every day | ORAL | Status: DC
  Administered 2022-07-01 – 2022-07-10 (×10): 1 via ORAL
  Filled 2022-07-01 (×11): qty 1

## 2022-07-01 MED ORDER — LACTATED RINGERS IV SOLN: INTRAVENOUS | Status: DC

## 2022-07-01 MED ORDER — SODIUM CHLORIDE 0.9% IV SOLUTION: Freq: Once | INTRAVENOUS | Status: DC

## 2022-07-01 MED ORDER — FUROSEMIDE 10 MG/ML IJ SOLN: 20.0000 mg | Freq: Once | INTRAMUSCULAR | Status: DC

## 2022-07-01 MED ORDER — ACETAMINOPHEN 325 MG PO TABS
650.0000 mg | ORAL_TABLET | Freq: Once | ORAL | Status: AC
  Administered 2022-07-01: 650 mg via ORAL
  Filled 2022-07-01: qty 2

## 2022-07-01 NOTE — Progress Notes (Addendum)
[facility]  PROGRESS NOTE  [name] NUU:[id number] DOB: 08/26/1928 DOA: 06/30/2022 PCP: Seward Carol, MD   Brief HPI:   88 year old female with medical history of hypertension, diabetes mellitus type 2, stage IV CKD, dementia, diastolic CHF, GERD who was on vacation with her daughter in [place] when she suffered a mechanical fall.  Patient went to balcony when she missed a step and fell down.  Her daughter found her on her back with right leg shortened and on outwards.  She was transferred to hospital.  Also surgeons at [place] told her that was a complex fracture and did not think it was safe to travel home.   Her daughter medi vaced her out today and travel time was 3 hours and 6 minutes. There is no paper chart.    Per accepting physician:   Came to vacation with her daughter and staying in a hotel but she tripped en route to the balcony and landed on her knee on 11/27.  R distal femur periprosthetic fracture.  Hemodynamically stable.  Daughter is NP in [place], refuses surgical intervention there.  Daughter called ortho and spoke with Dr. Ninfa Linden, willing to operate.  In knee immobilizer currently.  Geriatrician did see her, cleared for surgery.  Daughter booked an air ambulance and will be there within 2-3 hours and taking her straight to [facility], likely to arrive in 7-8 hours.  Will accept to Med Surg.   Daughter is [name], [phone].    I spoke with Dr. Ninfa Linden, who is happy to operate or arrange for a specialist she can.   I have communicated with patient placement and they will arrange a bed for her.  I have also provided the air ambulance coordinator telephone information to [place].   Finally, I called her daughter and notified her.       Subjective   Patient seen and examined, today hemoglobin dropped to 6.9 this morning.   Assessment/Plan:    Right distal femur periprosthetic fracture -Secondary to mechanical fall at [place] -She was evacuated to [facility] and  arrived at [facility] yesterday -Orthopedics consulted, Dr. Ninfa Linden saw the patient; he feels patient has complex fracture and he will need orthopedic trauma specialist to evaluate and treat the injury  Acute hypoxemic respiratory failure -O2 sats 86% on room air -Currently requiring 3 days of oxygen via nasal cannula -She has been on Lovenox for DVT prophylaxis -D-dimer was elevated at 2.79 -VQ scan ordered  Iron deficiency anemia/acute on chronic anemia -Patient baseline hemoglobin is around 9-10 -Serum iron 20, saturation 9% -Hemoglobin dropped to 6.9 today likely dilutional effect -2 units PRBC has been ordered -Follow CBC in a.m. -Follow FOBT  Acute kidney injury on CKD stage III -Patient's baseline creatinine 1.47 as of 03/15/2022 -Presented with creatinine of 4.45 -Likely dehydration and also she was started on ARB -Nephrology has been consulted -Start LR at 65 mL/h; discussed with Dr. Melvia Heaps -Follow renal function in a.m.  S/p fall -Patient fell in the balcony of hotel in [place] -Check CT head to rule out intracranial abnormality  History of seizures -Per daughter this was due to uncontrolled hypertension -Patient has been seizure-free for past 17 years -Continue Keppra  Dementia without behavior disturbance -Continue Remeron, Seroquel nightly  Diabetes mellitus type 2 -Hemoglobin A1c 6.4 -Continue sliding scale insulin with NovoLog -CBG well controlled     Medications     sodium chloride   Intravenous Once   acetaminophen  650 mg Oral  Q6H   allopurinol  100 mg Oral BID   furosemide  20 mg Intravenous Once   gabapentin  100 mg Oral BID   heparin injection (subcutaneous)  5,000 Units Subcutaneous Q8H   insulin aspart  0-9 Units Subcutaneous TID WC   levETIRAcetam  500 mg Oral q AM   mirtazapine  15 mg Oral QHS   multivitamin with minerals  1 tablet Oral Q breakfast   pantoprazole (PROTONIX) IV  40 mg Intravenous Q24H   QUEtiapine  25 mg Oral QHS      Data Reviewed:   CBG:  Recent Labs  Lab 07/01/22 0404 07/01/22 0755 07/01/22 1155  GLUCAP 111* 96 99    SpO2: 100 % O2 Flow Rate (L/min): 3 L/min    Vitals:   07/01/22 0759 07/01/22 1236 07/01/22 1447 07/01/22 1500  BP: (!) 144/47 (!) 135/46 (!) 165/73 (!) 168/55  Pulse: 97 91 95 91  Resp: '17 18 19 19  '$ Temp: 98.7 F (37.1 C) 98.9 F (37.2 C) 99.3 F (37.4 C) 99.3 F (37.4 C)  TempSrc: Oral Oral Oral Oral  SpO2: 100% 100% 100% 100%  Weight:      Height:          Data Reviewed:  Basic Metabolic Panel: Recent Labs  Lab 06/30/22 1906 07/01/22 0122  NA 137 136  K 4.6 4.9  CL 106 107  CO2 21* 18*  GLUCOSE 169* 117*  BUN 46* 49*  CREATININE 4.45* 4.69*  CALCIUM 8.3* 8.1*    CBC: Recent Labs  Lab 06/30/22 1906 06/30/22 2116 07/01/22 0122 07/01/22 0706 07/01/22 1204  WBC 10.3 10.4 9.8 9.0 8.1  NEUTROABS 8.3*  --   --   --   --   HGB 8.3* 7.5* 7.2* 7.5* 6.9*  HCT 25.4* 23.2* 23.1* 23.5* 22.2*  MCV 90.7 90.6 92.8 92.5 93.7  PLT 204 195 210 196 188    LFT Recent Labs  Lab 06/30/22 1906  AST 29  ALT 18  ALKPHOS 147*  BILITOT 0.4  PROT 6.1*  ALBUMIN 2.5*     Antibiotics: Anti-infectives (From admission, onward)    None        DVT prophylaxis: Heparin  Code Status: Full code  Family Communication: Discussed with patient's daughter at bedside   CONSULTS orthopedics, nephrology   Objective    Physical Examination:   General: Appears in no acute distress Cardiovascular: S1-S2, regular Respiratory: Clear to auscultation bilaterally Abdomen: Soft, nontender, no organomegaly Extremities: Right lower extremity is edematous, and knee immobilizer Neurologic: Alert, oriented to self, no focal deficit noted   Status is: Inpatient:             Oswald Hillock   [facility] If 7PM-7AM, please contact night-coverage at [email or web address], Office  [phone]   07/01/2022, 3:27 PM  LOS: 1 day

## 2022-07-01 NOTE — Consult Note (Addendum)
Orthopaedic Trauma Service (OTS) Consult   Patient ID: [name] MRN: [id number] DOB/AGE: 86/08/1928 86 y.o.    Reason for Consult: Right periprosthetic distal femur fracture from ground-level fall Referring Physician: Jean Rosenthal, MD (Ortho)   HPI: [name] is an 88 y.o. female with medical history notable for CKD, type 2 diabetes, hyper, dementia, remote history of seizures who sustained a ground-level fall while in [place] on Wednesday.  Patient was on vacation with her daughter.  She went outside on the patio to look at the moon when she sustained a fall.  Patient had immediate onset of pain and inability to bear weight.  Daughter arranged medevac flight back home to [place].  Patient was brought to [facility] yesterday.  Admitted with right periprosthetic distal femur fracture.  Patient lives with her daughter.  She should be using a cane and rollator but does not.  She still oftentimes wears heels.  She does ambulate quite a bit per daughter's report.  Still quite active.  Patient reports only pain in her right leg.  She is in a knee immobilizer.  No compressive dressing.  Denies any additional injuries elsewhere.  Denies any numbness or tingling in her lower extremity.  No other current complaints or concerns noted.  On admission patient was noted to have acute respiratory failure with hypoxia.  Also noted to have acute exacerbation of her chronic renal disease.  These are being actively.  Labs today show hemoglobin of 6.9 and a hematocrit of 22.2  Daughter reports severe hallucinations with Dilaudid  There were several times during the encounter where the patient did not even realize that she is in the hospital for a broken femur that requires surgical stabilization.  Keeps stating that she can get out of bed and walk on out of here  Past Medical History:  Diagnosis Date   Dementia ([place])    Diabetes mellitus    GERD (gastroesophageal reflux  disease)    Glaucoma    Heart disease    Hypertension    Tinnitus     Past Surgical History:  Procedure Laterality Date   BREAST LUMPECTOMY     bil for breast ca    Family History  Problem Relation Age of Onset   Diabetes Sister     Social History:  reports that she has quit smoking. She has never used smokeless tobacco. She reports that she does not drink alcohol and does not use drugs.  Allergies:  Allergies  Allergen Reactions   Ace Inhibitors Swelling, Rash and Cough   Prandin [Repaglinide] Other (See Comments)    Caused significant peripheral edema   Dilaudid [Hydromorphone Hcl] Itching and Other (See Comments)    Hallucinations (auditory and visual) also   Other Nausea And Vomiting    "Tussionex Pennkinetic ER"   Latex Rash   Tape Rash    Prefers PAPER TAPE, PLEASE!!    Medications: I have reviewed the patient's current medications. Current Outpatient Medications  Medication Instructions   albuterol (VENTOLIN HFA) 108 (90 Base) MCG/ACT inhaler 2 puffs, Inhalation, Every 6 hours PRN   allopurinol (ZYLOPRIM) 100 mg, Oral, 2 times daily   amLODipine (NORVASC) 10 mg, Oral, Daily   augmented betamethasone dipropionate (DIPROLENE-AF) 4.09 % cream 1 application , Topical, Daily PRN   blood glucose meter kit and supplies KIT Dispense based on patient and insurance preference. Use up to four times daily as directed. (FOR ICD-9 250.00, 250.01).  For QAC - HS accuchecks.   Continuous Blood Gluc Sensor (FREESTYLE LIBRE 2 SENSOR) MISC USE AS DIRECTED. REAPPLY EVERY 14 DAYS   gabapentin (NEURONTIN) 100 mg, Oral, 2 times daily   guaiFENesin-dextromethorphan (ROBITUSSIN DM) 100-10 MG/5ML syrup 10 mLs, Oral, Every 4 hours PRN   Insulin Syringe-Needle U-100 25G X 1" 1 ML MISC For 4 times a day insulin SQ, 1 month supply. Diagnosis E11.65   levETIRAcetam (KEPPRA) 500 mg, Oral, Every morning   loratadine (CLARITIN) 10 mg, Oral, Daily PRN   metoprolol tartrate (LOPRESSOR) 100 mg,  Oral, 2 times daily   mirtazapine (REMERON) 15 mg, Oral, Daily at bedtime   Multiple Vitamins-Minerals (CENTRUM SILVER 50+WOMEN) TABS 1 tablet, Oral, Daily with breakfast   NOVOLOG FLEXPEN 100 UNIT/ML FlexPen Subcutaneous, See admin instructions, Inject as directed into the skin three times a day with meals, per sliding scale and ONLY if the BGL is 200 or greater   omeprazole (PRILOSEC) 20 mg, Oral, Every M-W-F   QUEtiapine (SEROQUEL) 25 mg, Oral, Daily at bedtime      Results for orders placed or performed during the hospital encounter of 06/30/22 (from the past 48 hour(s))  CBC with Differential/Platelet     Status: Abnormal   Collection Time: 06/30/22  7:06 PM  Result Value Ref Range   WBC 10.3 4.0 - 10.5 K/uL   RBC 2.80 (L) 3.87 - 5.11 MIL/uL   Hemoglobin 8.3 (L) 12.0 - 15.0 g/dL   HCT 25.4 (L) 36.0 - 46.0 %   MCV 90.7 80.0 - 100.0 fL   MCH 29.6 26.0 - 34.0 pg   MCHC 32.7 30.0 - 36.0 g/dL   RDW 16.3 (H) 11.5 - 15.5 %   Platelets 204 150 - 400 K/uL   nRBC 0.0 0.0 - 0.2 %   Neutrophils Relative % 81 %   Neutro Abs 8.3 (H) 1.7 - 7.7 K/uL   Lymphocytes Relative 8 %   Lymphs Abs 0.8 0.7 - 4.0 K/uL   Monocytes Relative 8 %   Monocytes Absolute 0.8 0.1 - 1.0 K/uL   Eosinophils Relative 2 %   Eosinophils Absolute 0.2 0.0 - 0.5 K/uL   Basophils Relative 0 %   Basophils Absolute 0.0 0.0 - 0.1 K/uL   Immature Granulocytes 1 %   Abs Immature Granulocytes 0.05 0.00 - 0.07 K/uL    Comment: Performed at [facility], [address], [place], [place] [address]  Comprehensive metabolic panel     Status: Abnormal   Collection Time: 06/30/22  7:06 PM  Result Value Ref Range   Sodium 137 135 - 145 mmol/L   Potassium 4.6 3.5 - 5.1 mmol/L   Chloride 106 98 - 111 mmol/L   CO2 21 (L) 22 - 32 mmol/L   Glucose, Bld 169 (H) 70 - 99 mg/dL    Comment: Glucose reference range applies only to samples taken after fasting for at least 8 hours.   BUN 46 (H) 8 - 23 mg/dL   Creatinine, Ser 4.45  (H) 0.44 - 1.00 mg/dL   Calcium 8.3 (L) 8.9 - 10.3 mg/dL   Total Protein 6.1 (L) 6.5 - 8.1 g/dL   Albumin 2.5 (L) 3.5 - 5.0 g/dL   AST 29 15 - 41 U/L   ALT 18 0 - 44 U/L   Alkaline Phosphatase 147 (H) 38 - 126 U/L   Total Bilirubin 0.4 0.3 - 1.2 mg/dL   GFR, Estimated 9 (L) >60 mL/min    Comment: (NOTE) Calculated  using the CKD-EPI Creatinine Equation (2021)    Anion gap 10 5 - 15    Comment: Performed at [facility], [place] [address] [place], [place] [address]  Protime-INR     Status: Abnormal   Collection Time: 06/30/22  7:06 PM  Result Value Ref Range   Prothrombin Time 15.9 (H) 11.4 - 15.2 seconds   INR 1.3 (H) 0.8 - 1.2    Comment: (NOTE) INR goal varies based on device and disease states. Performed at [facility], [place] [address] [place], [place] [address]   Brain natriuretic peptide     Status: Abnormal   Collection Time: 06/30/22  7:06 PM  Result Value Ref Range   B Natriuretic Peptide 103.8 (H) 0.0 - 100.0 pg/mL    Comment: Performed at [facility] [address], [place], [place] [address]  D-dimer, quantitative     Status: Abnormal   Collection Time: 06/30/22  7:09 PM  Result Value Ref Range   D-Dimer, Quant 2.79 (H) 0.00 - 0.50 ug/mL-FEU    Comment: (NOTE) At the manufacturer cut-off value of 0.5 g/mL FEU, this assay has a negative predictive value of 95-100%.This assay is intended for use in conjunction with a clinical pretest probability (PTP) assessment model to exclude pulmonary embolism (PE) and deep venous thrombosis (DVT) in outpatients suspected of PE or DVT. Results should be correlated with clinical presentation. Performed at [facility], [place] [address] [place], [place] [address]   Type and screen [place]     Status: None   Collection Time: 06/30/22  9:12 PM  Result Value Ref Range   ABO/RH(D) O POS    Antibody Screen NEG    Sample Expiration      07/03/2022,2359 Performed at [facility], [place] [address], [place], [place] [address]   Ferritin     Status: None   Collection Time: 06/30/22  9:16 PM  Result Value Ref Range   Ferritin 111 11 - 307 ng/mL    Comment: Performed at [facility], [place] [address] [place], [place] [address]  Iron and TIBC     Status: Abnormal   Collection Time: 06/30/22  9:16 PM  Result Value Ref Range   Iron 20 (L) 28 - 170 ug/dL   TIBC 216 (L) 250 - 450 ug/dL   Saturation Ratios 9 (L) 10.4 - 31.8 %   UIBC 196 ug/dL    Comment: Performed at [facility] [address], [place], [place] [address]  CBC     Status: Abnormal   Collection Time: 06/30/22  9:16 PM  Result Value Ref Range   WBC 10.4 4.0 - 10.5 K/uL   RBC 2.56 (L) 3.87 - 5.11 MIL/uL   Hemoglobin 7.5 (L) 12.0 - 15.0 g/dL   HCT 23.2 (L) 36.0 - 46.0 %   MCV 90.6 80.0 - 100.0 fL   MCH 29.3 26.0 - 34.0 pg   MCHC 32.3 30.0 - 36.0 g/dL   RDW 16.3 (H) 11.5 - 15.5 %   Platelets 195 150 - 400 K/uL   nRBC 0.0 0.0 - 0.2 %    Comment: Performed at [facility] [address] [place],  [address]  APTT     Status: None   Collection Time: 06/30/22  9:50 PM  Result Value Ref Range   aPTT 34 24 - 36 seconds    Comment: Performed at [facility], [place] [address], [place], [place] [address]  Heparin level (unfractionated)     Status: Abnormal   Collection Time: 06/30/22  9:50 PM  Result Value Ref Range   Heparin Unfractionated 0.22 (L) 0.30 - 0.70 IU/mL    Comment: (NOTE) The clinical reportable range upper limit is being lowered to >1.10 to align with the FDA approved guidance for the current laboratory assay.  If heparin results are below expected values, and patient dosage has  been confirmed, suggest follow up testing of antithrombin III levels. Performed at [facility], [place] [address] [place], [place] [address]   CBC     Status: Abnormal   Collection Time: 07/01/22  1:22 AM  Result Value Ref Range   WBC 9.8 4.0 - 10.5 K/uL   RBC 2.49 (L) 3.87 -  5.11 MIL/uL   Hemoglobin 7.2 (L) 12.0 - 15.0 g/dL   HCT 23.1 (L) 36.0 - 46.0 %   MCV 92.8 80.0 - 100.0 fL   MCH 28.9 26.0 - 34.0 pg   MCHC 31.2 30.0 - 36.0 g/dL   RDW 16.4 (H) 11.5 - 15.5 %   Platelets 210 150 - 400 K/uL   nRBC 0.0 0.0 - 0.2 %    Comment: Performed at [facility], [place] [address] [place], [place] [address]  Basic metabolic panel     Status: Abnormal   Collection Time: 07/01/22  1:22 AM  Result Value Ref Range   Sodium 136 135 - 145 mmol/L   Potassium 4.9 3.5 - 5.1 mmol/L    Comment: HEMOLYSIS AT THIS LEVEL MAY AFFECT RESULT   Chloride 107 98 - 111 mmol/L   CO2 18 (L) 22 - 32 mmol/L   Glucose, Bld 117 (H) 70 - 99 mg/dL    Comment: Glucose reference range applies only to samples taken after fasting for at least 8 hours.   BUN 49 (H) 8 - 23 mg/dL   Creatinine, Ser 4.69 (H) 0.44 - 1.00 mg/dL   Calcium 8.1 (L) 8.9 - 10.3 mg/dL   GFR, Estimated 8 (L) >60 mL/min    Comment: (NOTE) Calculated using the CKD-EPI Creatinine Equation (2021)    Anion gap 11 5 - 15    Comment: Performed at [facility] [address] [place], [place] [address]  Glucose, capillary     Status: Abnormal   Collection Time: 07/01/22  4:04 AM  Result Value Ref Range   Glucose-Capillary 111 (H) 70 - 99 mg/dL    Comment: Glucose reference range applies only to samples taken after fasting for at least 8 hours.  Urinalysis, Routine w reflex microscopic Urine, Random     Status: Abnormal   Collection Time: 07/01/22  4:43 AM  Result Value Ref Range   Color, Urine YELLOW YELLOW   APPearance HAZY (A) CLEAR   Specific Gravity, Urine 1.013 1.005 - 1.030   pH 5.0 5.0 - 8.0   Glucose, UA 50 (A) NEGATIVE mg/dL   Hgb urine dipstick MODERATE (A) NEGATIVE   Bilirubin Urine NEGATIVE NEGATIVE   Ketones, ur NEGATIVE NEGATIVE mg/dL   Protein, ur >=300 (A) NEGATIVE mg/dL   Nitrite NEGATIVE NEGATIVE   Leukocytes,Ua TRACE (A) NEGATIVE   RBC / HPF 21-50 0 - 5 RBC/hpf   WBC, UA 11-20 0 - 5 WBC/hpf    Bacteria, UA FEW (A) NONE SEEN   Squamous Epithelial / LPF 0-5 0 - 5   Mucus PRESENT    Hyaline Casts,  UA PRESENT     Comment: Performed at [facility], [place] [address], [place], [place] [address]  Sodium, urine, random     Status: None   Collection Time: 07/01/22  4:43 AM  Result Value Ref Range   Sodium, Ur 67 mmol/L    Comment: Performed at [facility] [address], [place], [place] [address]  Creatinine, urine, random     Status: None   Collection Time: 07/01/22  4:43 AM  Result Value Ref Range   Creatinine, Urine 119 mg/dL    Comment: Performed at [facility] [address], [place], [place] [address]  CBC     Status: Abnormal   Collection Time: 07/01/22  7:06 AM  Result Value Ref Range   WBC 9.0 4.0 - 10.5 K/uL   RBC 2.54 (L) 3.87 - 5.11 MIL/uL   Hemoglobin 7.5 (L) 12.0 - 15.0 g/dL   HCT 23.5 (L) 36.0 - 46.0 %   MCV 92.5 80.0 - 100.0 fL   MCH 29.5 26.0 - 34.0 pg   MCHC 31.9 30.0 - 36.0 g/dL   RDW 16.4 (H) 11.5 - 15.5 %   Platelets 196 150 - 400 K/uL   nRBC 0.0 0.0 - 0.2 %    Comment: Performed at [facility], [place] [address], [place], [place] [address]  Glucose, capillary     Status: None   Collection Time: 07/01/22  7:55 AM  Result Value Ref Range   Glucose-Capillary 96 70 - 99 mg/dL    Comment: Glucose reference range applies only to samples taken after fasting for at least 8 hours.  Glucose, capillary     Status: None   Collection Time: 07/01/22 11:55 AM  Result Value Ref Range   Glucose-Capillary 99 70 - 99 mg/dL    Comment: Glucose reference range applies only to samples taken after fasting for at least 8 hours.  CBC     Status: Abnormal   Collection Time: 07/01/22 12:04 PM  Result Value Ref Range   WBC 8.1 4.0 - 10.5 K/uL   RBC 2.37 (L) 3.87 - 5.11 MIL/uL   Hemoglobin 6.9 (LL) 12.0 - 15.0 g/dL    Comment: REPEATED TO VERIFY THIS CRITICAL RESULT HAS VERIFIED AND BEEN CALLED TO TAMESHIA STAMPS RN BY MELISSA BOOSTEDT ON 12 03  2023 AT 1243, AND HAS BEEN READ BACK.     HCT 22.2 (L) 36.0 - 46.0 %   MCV 93.7 80.0 - 100.0 fL   MCH 29.1 26.0 - 34.0 pg   MCHC 31.1 30.0 - 36.0 g/dL   RDW 16.2 (H) 11.5 - 15.5 %   Platelets 188 150 - 400 K/uL   nRBC 0.0 0.0 - 0.2 %    Comment: Performed at [facility] [address] [place], [place] [address]  Prepare RBC (crossmatch)     Status: None   Collection Time: 07/01/22  1:19 PM  Result Value Ref Range   Order Confirmation      ORDER PROCESSED BY BLOOD BANK Performed at [facility], [place] [address] [place], [place] [address]     DG HIP UNILAT WITH PELVIS 1V RIGHT  Result Date: 06/30/2022 CLINICAL DATA:  Closed fracture distal femur.  [id number]. EXAM: DG HIP (WITH OR WITHOUT PELVIS) 1V RIGHT; RIGHT KNEE - 1-2 VIEW COMPARISON:  Immediate postoperative films following right knee replacement 11/24/2008, abdomen film including the pelvis and hips on 10/13/2020. FINDINGS: AP pelvis and separate  AP right hip view: Osteopenia. No displaced fracture is seen of the pelvis and proximal femurs. Symmetric joint space loss is again noted both superior hips, small acetabular osteophytes and mild pelvic enthesopathy with spurring at the SI joints. Intestinal postsurgical changes are noted in the left lower abdomen, as before. Single surgical clip at the level of the mid right hemisacrum. 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 Right knee AP Lat only, portable: Osteopenia. 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 There is a markedly comminuted distal femoral shaft fracture extending to the metaphyseal bone alongside the hardware. There is a mild spreading of the comminution fragments, the main distal fragment posteriorly displaced about 1/2 of a shaft width, laterally displaced about 1/3 of a shaft width with slight apex dorsal angulation and a slight valgus angulation. There are calcifications in the femoral, popliteal and popliteal  trifurcation arteries. There is no further evidence of fractures. IMPRESSION: 1. Osteopenia and degenerative change without evidence of displaced fracture of the pelvis and proximal femurs. 2. Markedly comminuted distal femoral shaft fracture extending to the metaphyseal bone alongside the TKA hardware, with a mild spreading of the comminution fragments, the main distal fragment posteriorly displaced about 1/2 of a shaft width, laterally displaced 1/3 of a shaft width with slight apex dorsal and valgus angulation. Electronically Signed   By: Telford Nab M.D.   On: 06/30/2022 20:54   DG Knee 1-2 Views Right  Result Date: 06/30/2022 CLINICAL DATA:  Closed fracture distal femur.  [id number]. EXAM: DG HIP (WITH OR WITHOUT PELVIS) 1V RIGHT; RIGHT KNEE - 1-2 VIEW COMPARISON:  Immediate postoperative films following right knee replacement 11/24/2008, abdomen film including the pelvis and hips on 10/13/2020. FINDINGS: AP pelvis and separate AP right hip view: Osteopenia. No displaced fracture is seen of the pelvis and proximal femurs. Symmetric joint space loss is again noted both superior hips, small acetabular osteophytes and mild pelvic enthesopathy with spurring at the SI joints. Intestinal postsurgical changes are noted in the left lower abdomen, as before. Single surgical clip at the level of the mid right hemisacrum. 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 Right knee AP Lat only, portable: Osteopenia. 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 There is a markedly comminuted distal femoral shaft fracture extending to the metaphyseal bone alongside the hardware. There is a mild spreading of the comminution fragments, the main distal fragment posteriorly displaced about 1/2 of a shaft width, laterally displaced about 1/3 of a shaft width with slight apex dorsal angulation and a slight valgus angulation. There are calcifications in the femoral, popliteal and popliteal  trifurcation arteries. There is no further evidence of fractures. IMPRESSION: 1. Osteopenia and degenerative change without evidence of displaced fracture of the pelvis and proximal femurs. 2. Markedly comminuted distal femoral shaft fracture extending to the metaphyseal bone alongside the TKA hardware, with a mild spreading of the comminution fragments, the main distal fragment posteriorly displaced about 1/2 of a shaft width, laterally displaced 1/3 of a shaft width with slight apex dorsal and valgus angulation. Electronically Signed   By: Telford Nab M.D.   On: 06/30/2022 20:54   DG CHEST PORT 1 VIEW  Result Date: 06/30/2022 CLINICAL DATA:  Hypoxia EXAM: PORTABLE CHEST 1 VIEW COMPARISON:  Chest x-ray 03/10/2022 FINDINGS: Heart is enlarged, unchanged. There is some scattered interstitial opacities which may be chronic. There is no focal consolidation, pleural effusion or pneumothorax. Left axillary surgical clips are present. There are atherosclerotic calcifications of  the aorta. There severe degenerative changes of both shoulders. IMPRESSION: 1. Cardiomegaly. 2. Scattered interstitial opacities which may be chronic. No focal consolidation. Electronically Signed   By: Ronney Asters M.D.   On: 06/30/2022 19:21    Intake/Output      12/02 0701 12/03 0700 12/03 0701 12/04 0700   P.O. 240    I.V. (mL/kg) 488 (8.3)    Total Intake(mL/kg) 728 (12.3)    Urine (mL/kg/hr) 1000 0 (0)   Emesis/NG output  0   Stool  0   Total Output 1000 0   Net -272 0           Review of Systems  Constitutional:  Negative for chills and fever.  Respiratory:  Negative for shortness of breath.   Cardiovascular:  Negative for chest pain and palpitations.  Gastrointestinal:  Negative for nausea and vomiting.  Musculoskeletal:        R knee pain   Neurological:  Negative for tingling and sensory change.   Blood pressure (!) 135/46, pulse 91, temperature 98.9 F (37.2 C), temperature source Oral, resp. rate 18,  height _0  (1.549 m), weight 59.1 kg, SpO2 100 %. Physical Exam   Assessment/Plan:  88 year old female ground-level fall with right periprosthetic distal femur fracture, acute blood loss anemia, acute on chronic renal insufficiency  -Ground-level fall  -Right periprosthetic distal femur fracture  This is an unstable fracture that will require surgical stabilization  Plan for OR on Tuesday that way we can medically optimize patient in preparation for surgery   Bedrest for now    Will order air mattress overlay   Will have [employer]'s wrap compressive dressing from foot to thigh and then place knee immobilizer back over top  NwB R leg for now   Weightbearing status postop to be determined  - Pain management:  Optimize nonnarcotic medications  - ABL anemia/Hemodynamics  Transfused 2 units of packed red blood cells today in preparation for surgery  - Medical issues   Per medical service   Routine decubitus precautions   Float heels off   Turn every 2 hours and as needed   Given presentation to the emergency department as well as air travel given long bone fracture do think that it would be prudent to complete workup for hypoxia prior to going to the OR.  VQ scan has been ordered  - DVT/PE prophylaxis:  Subcu heparin  Given CKD will likely do Eliquis postoperatively  - ID:   Renally dosed perioperative antibiotics  - Metabolic Bone Disease:  Fracture is a fragility fracture based off mechanism  Likely poor bone quality due to age as well as diabetes and CKD  Check vitamin D levels  Optimize nutrition   - Activity:  As above - FEN/GI prophylaxis/Foley/Lines:  Renal diet   - Impediments to fracture healing:  Diabetes  CKD  Fragility fracture - Dispo:  Continue medical workup  Transfused 2 units of packed red cells today, monitor CBC  Plan for OR on Tuesday for ORIF right distal femur    Jari Pigg, PA-C [phone] (C) 07/01/2022, 1:22  PM  [facility] [place] [address] [phone] Jenetta [phone] (F)    After 5pm and on the weekends please log on to Am[employer], go to orthopaedics and the look under the [facility] Call for the provider(s) on call. You can also call our office at [phone] and then follow the prompts to be connected to the call team.

## 2022-07-01 NOTE — Consult Note (Signed)
Renal Service Consult Note [facility]  [name] 07/01/2022 Sol Blazing, MD Requesting Physician: Dr. Darrick Meigs  Reason for Consult: Renal faliure HPI: The patient is a 88 y.o. year-old w/ PMH as below who presented after sustaining a mechanical fall. The elderly patient has dementia and was on vacation in [place] w/ her family when she fall walking down a couple of steps on an outdoor balcony. She sustained a complex periprosthetic fracture of the R knee. Family flew her back to [place] to get medical care here.  Upon arrival at [facility] on 12/02, BP's were wnl, HR 90, RR 18, temp 99.4. CXR was clear. Labs showed BUN 46, creat 4.45 (b/l creat 1.4- 1.8 from aug 2023). Ca 8.1, alb 2.5, LFT"s okay, WBC 10K, Hb 7.5. BNP 104. CTH was wnl. Renal US pending. Pt has put out 1.35 L of UOP since arrival. We are asked to see for renal failure.   Pt seen in room.  Pt has dementia and doesn't provide any history. Daughter provided hx of her fall in [place].    ROS - n/a  Past Medical History  Past Medical History:  Diagnosis Date   Dementia ([place])    Diabetes mellitus    GERD (gastroesophageal reflux disease)    Glaucoma    Heart disease    Hypertension    Tinnitus    Past Surgical History  Past Surgical History:  Procedure Laterality Date   BREAST LUMPECTOMY     bil for breast ca   Family History  Family History  Problem Relation Age of Onset   Diabetes Sister    Social History  reports that she has quit smoking. She has never used smokeless tobacco. She reports that she does not drink alcohol and does not use drugs. Allergies  Allergies  Allergen Reactions   Ace Inhibitors Swelling, Rash and Cough   Prandin [Repaglinide] Other (See Comments)    Caused significant peripheral edema   Dilaudid [Hydromorphone Hcl] Itching and Other (See Comments)    Hallucinations (auditory and visual) also   Other Nausea And Vomiting    "Tussionex Pennkinetic ER"   Latex Rash    Tape Rash    Prefers PAPER TAPE, PLEASE!!   Home medications Prior to Admission medications   Medication Sig Start Date End Date Taking? Authorizing Provider  albuterol (VENTOLIN HFA) 108 (90 Base) MCG/ACT inhaler Inhale 2 puffs into the lungs every 6 (six) hours as needed for wheezing or shortness of breath. 03/16/22   Caren Griffins, MD  allopurinol (ZYLOPRIM) 100 MG tablet Take 100 mg by mouth 2 (two) times daily.    [provider]  amLODipine (NORVASC) 10 MG tablet Take 10 mg by mouth daily.    [provider]  augmented betamethasone dipropionate (DIPROLENE-AF) 0.05 % cream Apply 1 application  topically daily as needed for dry skin (affected areas). 10/06/20   [provider]  blood glucose meter kit and supplies KIT Dispense based on patient and insurance preference. Use up to four times daily as directed. (FOR ICD-9 250.00, 250.01). For QAC - HS accuchecks. 11/27/20   Thurnell Lose, MD  Continuous Blood Gluc Sensor (FREESTYLE LIBRE 2 SENSOR) MISC USE AS DIRECTED. REAPPLY EVERY 14 DAYS Patient taking differently: Inject 1 Device into the skin every 14 (fourteen) days. 11/20/21   Renato Shin, MD  gabapentin (NEURONTIN) 100 MG capsule Take 100 mg by mouth 2 (two) times daily.    [provider]  guaiFENesin-dextromethorphan (ROBITUSSIN DM) 100-10 MG/5ML syrup Take 10 mLs by mouth every 4 (four) hours as needed for cough. 03/16/22   Caren Griffins, MD  Insulin Syringe-Needle U-100 25G X 1" 1 ML MISC For 4 times a day insulin SQ, 1 month supply. Diagnosis E11.65 11/27/20   Thurnell Lose, MD  levETIRAcetam (KEPPRA) 500 MG tablet Take 500 mg by mouth in the morning.    [provider]  loratadine (CLARITIN) 10 MG tablet Take 10 mg by mouth daily as needed for allergies.    [provider]  metoprolol tartrate (LOPRESSOR) 100 MG tablet Take 100 mg by mouth 2 (two) times daily. 08/03/20   [provider]  mirtazapine (REMERON)  15 MG tablet Take 15 mg by mouth at bedtime.     [provider]  Multiple Vitamins-Minerals (CENTRUM SILVER 50+WOMEN) TABS Take 1 tablet by mouth daily with breakfast.    [provider]  NOVOLOG FLEXPEN 100 UNIT/ML FlexPen Inject into the skin See admin instructions. Inject as directed into the skin three times a day with meals, per sliding scale and ONLY if the BGL is 200 or greater    [provider]  omeprazole (PRILOSEC) 20 MG capsule Take 20 mg by mouth every Monday, Wednesday, and Friday.    [provider]  QUEtiapine (SEROQUEL) 25 MG tablet Take 25 mg by mouth at bedtime. 12/01/20   [provider]     Vitals:   07/01/22 2025 07/01/22 2037 07/01/22 2100 07/01/22 2115  BP:   (!) 162/56   Pulse: (!) 103 (!) 102 99 99  Resp:    18  Temp: 99.4 F (37.4 C) 98.6 F (37 C) 98.5 F (36.9 C) 98.5 F (36.9 C)  TempSrc: Oral Oral Oral   SpO2: 94% 96% 95%   Weight:      Height:       Exam Gen alert, elderly, frail, no distress No rash, cyanosis or gangrene Sclera anicteric, throat clear  No jvd or bruits Chest clear bilat to bases, no rales/ wheezing RRR no MRG Abd soft ntnd no mass or ascites +bs GU defer MS no joint effusions or deformity Ext no LE or UE edema, no wounds or ulcers Neuro is alert, Ox 3 , nf      Home meds include - albuterol, allopurinol, amlodipine 10, gabapentin, insulin novolog, keppra, metoprolol 100 bid, mirtazapine, MVI, prilosec, seroquel, prns/ vits/ supps     Date   Creat  eGFR   2010   0.79- 1.07   2016-2018  1.28- 1.57 33- 44 ml/min   April-may 2022 2.33 >> 1.39  AKI episode, 20-36 ml/min    Aug 2023  1.47- 2.17 21- 33 ml/min, CKD IV   12/02   4.45    07/01/22  4.69        B/l creat 1.5- 1.8, from aug 2023, eGFR 26- 32 ml/min    UA - prot > 300, 21-50 rbc, 11-20 wbc, 0-5 epis, few bacteria    UNa 67,  UCr 119    CXR 12/02 - no edema, no infiltrates no effusion    BP's here 150- 170/55- 70, HR  90s, RR 18- 25, [place] 2 L    Assessment/ Plan: AKI on CKD 4 -  B/l creat 1.5- 1.8, from aug 2023, eGFR 26- 32 ml/min. Creat here 4.45 on admission in setting of a fall and a peri-prosthetic fracture of the R knee that happened out of country 4 days ago. She  was in hospital in [place] from Wed to Sunday. Her family flew her to [place] today. Pt has dementia.  No records from OSH. BP's good, UA shows protein/ rbc's/ wbc's. Urine lytes nonspecific. Renal US is pending. Exam looks euvolemic possilby dry.  Suspect her AKI is from dehydration, less likely GN or other (AIN, ATN).  Will get urine culture and start IVF"S at moderate rate, place foley cath for accurate records. Will follow.  Fall sp periprosthetic R knee fracture - surgery per ortho Anemia - got 2u prbc's today.  Dementia - hold gabapentin w/ sig renal failure at risk for CNS toxicity HTN - bp's are normal, slightly high. Holding home BP lowering meds for now, agree.       Rob Dasani Crear  MD 07/01/2022, 9:52 PM Recent Labs  Lab 06/30/22 1906 06/30/22 2116 07/01/22 0122 07/01/22 0706 07/01/22 1204  HGB 8.3*   < > 7.2* 7.5* 6.9*  ALBUMIN 2.5*  --   --   --   --   CALCIUM 8.3*  --  8.1*  --   --   CREATININE 4.45*  --  4.69*  --   --   K 4.6  --  4.9  --   --    < > = values in this interval not displayed.   Inpatient medications:  sodium chloride   Intravenous Once   acetaminophen  650 mg Oral Q6H   allopurinol  100 mg Oral BID   feeding supplement (NEPRO CARB STEADY)  237 mL Oral BID BM   gabapentin  100 mg Oral BID   heparin injection (subcutaneous)  5,000 Units Subcutaneous Q8H   insulin aspart  0-9 Units Subcutaneous TID WC   levETIRAcetam  500 mg Oral q AM   mirtazapine  15 mg Oral QHS   multivitamin  1 tablet Oral QHS   pantoprazole (PROTONIX) IV  40 mg Intravenous Q24H   QUEtiapine  25 mg Oral QHS    sodium chloride Stopped (07/01/22 2138)   lactated ringers     methocarbamol (ROBAXIN) IV      methocarbamol **OR** methocarbamol (ROBAXIN) IV, oxyCODONE

## 2022-07-01 NOTE — Progress Notes (Addendum)
Pt straight cathed w/1000 of dark amber colored urine.  Pt tolerated well and stayed sleep for most of the cath.  Sterile technique used.  Pt cleaned before and after cath.  Pure wick reconnected to pt.   Will monitor for urine.

## 2022-07-01 NOTE — Evaluation (Signed)
Speech Language Pathology Evaluation Patient Details Name: [name] MRN: [id number] DOB: [birth year] Today's Date: 07/01/2022 Time: 6644-0347 SLP Time Calculation (min) (ACUTE ONLY): 20 min  Problem List:  Patient Active Problem List   Diagnosis Date Noted   Closed fracture of right distal femur ([place]) 06/30/2022   Chronic diastolic CHF (congestive heart failure) ([place]) 06/30/2022   history of seizures 06/30/2022   Acute respiratory failure with hypoxia ([place]) 06/30/2022   Acute on chronic anemia 06/30/2022   Pneumonia due to COVID-19 virus 03/10/2022   Dementia ([place]) 03/10/2022   Heart failure ([place]) 03/10/2022   AKI (acute kidney injury) ([place]) 03/10/2022   Acute renal failure superimposed on stage 4 chronic kidney disease ([place]) 03/10/2022   Type 2 diabetes mellitus with complication, without long-term current use of insulin (H[facility])    Glaucoma    HHNC (hyperglycemic hyperosmolar nonketotic coma) ([place]) 11/24/2020   Hyperglycemia 11/24/2020   UTI (urinary tract infection) 42/59/5638   Acute metabolic encephalopathy 75/64/3329   Hypokalemia 11/24/2020   GERD (gastroesophageal reflux disease)    Hypertension    Past Medical History:  Past Medical History:  Diagnosis Date   Dementia ([place])    Diabetes mellitus    GERD (gastroesophageal reflux disease)    Glaucoma    Heart disease    Hypertension    Tinnitus    Past Surgical History:  Past Surgical History:  Procedure Laterality Date   BREAST LUMPECTOMY     bil for breast ca   HPI:  Patient is a 88 y.o. female with PMH: dementia, HTN, DM-2, CKD stage IV, diastolic CHF, GERD. She was on vacation with her daughter in [place] when she suffered a mechanical fall. She was taken to the hospital in [place] and ortho surgeons reported it was a complex fracture. Daughter had patient sent home via [facility] and she was then admitted at [facility]. Patient's hemoglobin baseline is 9-10 but it dropped to 6.9 and she is to get tranfusion. Plan is for  possible surgery on Tuesday for repair of complex comminuted distal femur fracture. SLP ordered secondary to concern for change in patient's speech intelligibility.   Assessment / Plan / Recommendation Clinical Impression  Patient not currently presenting with significant change in her speech, language or cognitive function as per this evaluation and background information from daughter who was in the room. Daughter feels that patient's current function is impacted by sedating medications. When SLP entered room, patient had eyes closed but she would suddenly open her eyes and look around. Mouth appeared slightly dry and SLP did provide some water via toothette sponges to help moisten oral mucosa. Speech intelligibility was [facility]) overall but with instances of decreased speech clarity as well as instances of word finding errors during unstructured speech. She is oriented to self, knows she is in the hospital, recalls she was on a vacation but does not consistently recall what happened and continues to ask about her leg (which is broken) and why she can't move it. When asked how patient's memory, speech, language abilities seemed as compared to her baseline, patient's daughter reported that she seems to be at baseline. SLP not recommending any further skilled interventions at this time. Thank you for this consult!    SLP Assessment  SLP Recommendation/Assessment: Patient does not need any further Speech Lanaguage Pathology Services SLP Visit Diagnosis: Dysarthria and anarthria (R47.1);Cognitive communication deficit (R41.841)    Recommendations for follow up therapy are one component of a multi-disciplinary discharge planning process, led by  the attending physician.  Recommendations may be updated based on patient status, additional functional criteria and insurance authorization.    Follow Up Recommendations  No SLP follow up    Assistance Recommended at Discharge  Frequent or constant  Supervision/Assistance  Functional Status Assessment Patient has had a recent decline in their functional status and demonstrates the ability to make significant improvements in function in a reasonable and predictable amount of time.  Frequency and Duration   N/A        SLP Evaluation Cognition  Overall Cognitive Status: History of cognitive impairments - at baseline Arousal/Alertness: Awake/alert Orientation Level: Oriented to person;Disoriented to place;Disoriented to time;Disoriented to situation Month: November Attention: Sustained Sustained Attention: Impaired Memory: Impaired Awareness: Impaired Safety/Judgment: Impaired       Comprehension  Auditory Comprehension Overall Auditory Comprehension: Impaired at baseline    Expression Expression Primary Mode of Expression: Verbal Verbal Expression Overall Verbal Expression: Impaired at baseline   Oral / Motor  Oral Motor/Sensory Function Overall Oral Motor/Sensory Function: Generalized oral weakness Facial ROM: Within Functional Limits Facial Symmetry: Within Functional Limits Facial Strength: Within Functional Limits Facial Sensation: Within Functional Limits Lingual ROM: Within Functional Limits Lingual Symmetry: Within Functional Limits Motor Speech Overall Motor Speech: Impaired Respiration: Within functional limits Phonation: Normal Resonance: Within functional limits Articulation: Impaired Level of Impairment: Phrase Intelligibility: Intelligibility reduced Word: 75-100% accurate Phrase: 75-100% accurate Sentence: 75-100% accurate Conversation: Not tested Motor Planning: Witnin functional limits Motor Speech Errors: Not applicable Interfering Components: Premorbid status Effective Techniques: Slow rate            Sonia Baller, MA, CCC-SLP Speech Therapy

## 2022-07-01 NOTE — Progress Notes (Signed)
Critical Lab Hemoglobin 6.9 MD notified no new orders at this time

## 2022-07-01 NOTE — Progress Notes (Signed)
Initial Nutrition Assessment RD working remotely.   DOCUMENTATION CODES:   Not applicable  INTERVENTION:  - ordered Nepro Shake BID, each supplement provides 425 kcal and 19 grams protein.  - ordered 1 tablet rena-vite/day.  - complete NFPE when feasible.   NUTRITION DIAGNOSIS:   Increased nutrient needs related to hip fracture as evidenced by estimated needs.  GOAL:   Patient will meet greater than or equal to 90% of their needs  MONITOR:   PO intake, Supplement acceptance, Labs, Weight trends  REASON FOR ASSESSMENT:   Consult Assessment of nutrition requirement/status  ASSESSMENT:   88 y.o. female with medical history of HTN, type 2 DM, stage IV CKD, dementia, diastolic CHF, GERD, and dementia. She was on vacation with her daughter in [place] when she suffered a mechanical fall. Wednesday night she went out to her balcony and missed a step and fell down. Her daughter found her on her back with her right leg shortened and turned outwards. She was taken to the hospital and then transferred to a trauma hospital on Thursday.  The ortho surgeons in [place] did say it was complex fracture and didn't think it was safe to travel home. She has been on lovenox and last dose given at 9:30AM. (Prophylactic dose). Her daughter medi vaced her out today and travel time was 3 hours and 6 minutes. There is no paper chart.  Patient noted to be a/o to self only. Diet changed from Renal with 1.2 L fluid restriction to NPO at midnight and re-advanced to Renal with 1.5 L fluid restriction today at 1254. Flow sheet documentation indicates she ate 40% of dinner yesterday.  She has not been assessed by a [place] RD since 11/25/20.  Weight yesterday was 130 lb and weight on 03/16/22 was 156 lb. This indicates 26 lb weight loss (16.7% body weight) in the past ~4 months; significant for time frame. No documentation in the edema section of flow sheet.   Plan for OR on 12/5 for R  periprosthetic distal femur fx.   Labs reviewed; BUN: 49 mg/dl, creatinine: 4.69 mg/dl, Ca: 8.1 mg/dl, GFR: 8 ml/min. Medications reviewed; 20 mg IV lasix/day, sliding scale novolog, 40 mg IV protonix/day. IVF; LR @ 65 ml/hr x12 hrs starting at 1630.    NUTRITION - FOCUSED PHYSICAL EXAM:  RD working remotely.  Diet Order:   Diet Order             Diet renal with fluid restriction Fluid restriction: 1500 mL Fluid; Room service appropriate? Yes; Fluid consistency: Thin  Diet effective now                   EDUCATION NEEDS:   Not appropriate for education at this time  Skin:  Skin Assessment: Reviewed RN Assessment  Last BM:  PTA/unknown  Height:   Ht Readings from Last 1 Encounters:  06/30/22 '5\' 1"'$  (1.549 m)    Weight:   Wt Readings from Last 1 Encounters:  06/30/22 59.1 kg     BMI:  Body mass index is 24.62 kg/m.  Estimated Nutritional Needs:  Kcal:  1475-1700 kcal Protein:  70-85 grams Fluid:  >/= 1.6 L/day     Jarome Matin, MS, RD, LDN, CNSC Clinical Dietitian PRN/Relief staff On-call/weekend pager # available in [facility]

## 2022-07-01 NOTE — Progress Notes (Signed)
Orthopedic Tech Progress Note Patient Details:  [name] 10/16/1928 [id number]  Applied bulky jones dressing to the pt as the order specified. Pt was well padded with cotton web roll from the foot to the thigh and wrapped over with ace wrap. The knee immobilizer was applied afterwards due to family concerns that the pt may attempt to move their leg.    Ortho Devices Type of Ortho Device: Ace wrap, Cotton web roll Ortho Device/Splint Location: RLE Ortho Device/Splint Interventions: Ordered, Application, Adjustment   Post Interventions Patient Tolerated: Fair Instructions Provided: Adjustment of device, Care of device  Arville Go 07/01/2022, 3:40 PM

## 2022-07-02 ENCOUNTER — Inpatient Hospital Stay (HOSPITAL_COMMUNITY): Payer: PPO

## 2022-07-02 DIAGNOSIS — S72491A Other fracture of lower end of right femur, initial encounter for closed fracture: Secondary | ICD-10-CM | POA: Diagnosis not present

## 2022-07-02 LAB — COMPREHENSIVE METABOLIC PANEL
ALT: 16 U/L (ref 0–44)
AST: 26 U/L (ref 15–41)
Albumin: 2.4 g/dL — ABNORMAL LOW (ref 3.5–5.0)
Alkaline Phosphatase: 148 U/L — ABNORMAL HIGH (ref 38–126)
Anion gap: 11 (ref 5–15)
BUN: 51 mg/dL — ABNORMAL HIGH (ref 8–23)
CO2: 20 mmol/L — ABNORMAL LOW (ref 22–32)
Calcium: 8.6 mg/dL — ABNORMAL LOW (ref 8.9–10.3)
Chloride: 107 mmol/L (ref 98–111)
Creatinine, Ser: 3.76 mg/dL — ABNORMAL HIGH (ref 0.44–1.00)
GFR, Estimated: 11 mL/min — ABNORMAL LOW (ref >60)
Glucose, Bld: 132 mg/dL — ABNORMAL HIGH (ref 70–99)
Potassium: 4.5 mmol/L (ref 3.5–5.1)
Sodium: 138 mmol/L (ref 135–145)
Total Bilirubin: 0.9 mg/dL (ref 0.3–1.2)
Total Protein: 6.1 g/dL — ABNORMAL LOW (ref 6.5–8.1)

## 2022-07-02 LAB — CBC
HCT: 25.4 % — ABNORMAL LOW (ref 36.0–46.0)
Hemoglobin: 9 g/dL — ABNORMAL LOW (ref 12.0–15.0)
MCH: 30.1 pg (ref 26.0–34.0)
MCHC: 35.4 g/dL (ref 30.0–36.0)
MCV: 84.9 fL (ref 80.0–100.0)
Platelets: 198 10*3/uL (ref 150–400)
RBC: 2.99 MIL/uL — ABNORMAL LOW (ref 3.87–5.11)
RDW: 16.5 % — ABNORMAL HIGH (ref 11.5–15.5)
WBC: 9.3 10*3/uL (ref 4.0–10.5)
nRBC: 0 % (ref 0.0–0.2)

## 2022-07-02 LAB — TYPE AND SCREEN
ABO/RH(D): O POS
Antibody Screen: NEGATIVE
Unit division: 0
Unit division: 0

## 2022-07-02 LAB — BPAM RBC
Blood Product Expiration Date: 202312212359
Blood Product Expiration Date: 202312242359
ISSUE DATE / TIME: 202312031438
ISSUE DATE / TIME: 202312032006
Unit Type and Rh: 5100
Unit Type and Rh: 5100

## 2022-07-02 LAB — GLUCOSE, CAPILLARY
Glucose-Capillary: 140 mg/dL — ABNORMAL HIGH (ref 70–99)
Glucose-Capillary: 163 mg/dL — ABNORMAL HIGH (ref 70–99)
Glucose-Capillary: 148 mg/dL — ABNORMAL HIGH (ref 70–99)

## 2022-07-02 LAB — VITAMIN D 25 HYDROXY (VIT D DEFICIENCY, FRACTURES): Vit D, 25-Hydroxy: 41.12 ng/mL (ref 30–100)

## 2022-07-02 LAB — CK: Total CK: 188 U/L (ref 38–234)

## 2022-07-02 MED ORDER — DARBEPOETIN ALFA 200 MCG/0.4ML IJ SOSY
200.0000 ug | PREFILLED_SYRINGE | Freq: Once | INTRAMUSCULAR | Status: AC
  Administered 2022-07-02: 200 ug via SUBCUTANEOUS
  Filled 2022-07-02: qty 0.4

## 2022-07-02 MED ORDER — GUAIFENESIN 100 MG/5ML PO LIQD: 5.0000 mL | ORAL | Status: DC | PRN

## 2022-07-02 MED ORDER — CEFAZOLIN SODIUM-DEXTROSE 2-4 GM/100ML-% IV SOLN: 2.0000 g | INTRAVENOUS | Status: DC

## 2022-07-02 MED ORDER — SODIUM CHLORIDE 0.9 % IV SOLN
1.0000 g | INTRAVENOUS | Status: AC
  Administered 2022-07-04 – 2022-07-06 (×3): 1 g via INTRAVENOUS
  Filled 2022-07-02 (×4): qty 10

## 2022-07-02 MED ORDER — SODIUM CHLORIDE 0.9 % IV SOLN
2.0000 g | INTRAVENOUS | Status: AC
  Administered 2022-07-03: 2 g via INTRAVENOUS
  Filled 2022-07-02: qty 20

## 2022-07-02 MED ORDER — SODIUM CHLORIDE 0.9 % IV SOLN
1.0000 g | INTRAVENOUS | Status: AC
  Administered 2022-07-02: 1 g via INTRAVENOUS
  Filled 2022-07-02: qty 10

## 2022-07-02 MED ORDER — ACETAMINOPHEN 325 MG PO TABS
650.0000 mg | ORAL_TABLET | Freq: Four times a day (QID) | ORAL | Status: DC | PRN
  Administered 2022-07-02 – 2022-07-06 (×3): 650 mg via ORAL
  Filled 2022-07-02 (×3): qty 2

## 2022-07-02 MED ORDER — FUROSEMIDE 10 MG/ML IJ SOLN
INTRAMUSCULAR | Status: AC
  Administered 2022-07-02: 20 mg
  Filled 2022-07-02: qty 2

## 2022-07-02 MED ORDER — TRAZODONE HCL 50 MG PO TABS
50.0000 mg | ORAL_TABLET | Freq: Every evening | ORAL | Status: DC | PRN
  Administered 2022-07-02 – 2022-07-03 (×2): 50 mg via ORAL
  Filled 2022-07-02 (×2): qty 1

## 2022-07-02 MED ORDER — METOPROLOL TARTRATE 5 MG/5ML IV SOLN
5.0000 mg | INTRAVENOUS | Status: DC | PRN
  Filled 2022-07-02: qty 5

## 2022-07-02 MED ORDER — SENNOSIDES-DOCUSATE SODIUM 8.6-50 MG PO TABS
1.0000 | ORAL_TABLET | Freq: Every evening | ORAL | Status: DC | PRN
  Administered 2022-07-04: 1 via ORAL
  Filled 2022-07-02: qty 1

## 2022-07-02 MED ORDER — IPRATROPIUM-ALBUTEROL 0.5-2.5 (3) MG/3ML IN SOLN: 3.0000 mL | RESPIRATORY_TRACT | Status: DC | PRN

## 2022-07-02 MED ORDER — ONDANSETRON HCL 4 MG/2ML IJ SOLN
4.0000 mg | Freq: Four times a day (QID) | INTRAMUSCULAR | Status: DC | PRN
  Administered 2022-07-03: 4 mg via INTRAVENOUS

## 2022-07-02 MED ORDER — HYDRALAZINE HCL 20 MG/ML IJ SOLN: 10.0000 mg | INTRAMUSCULAR | Status: DC | PRN

## 2022-07-02 NOTE — Progress Notes (Signed)
   07/02/22 0007 07/02/22 0030 07/02/22 0100  Vitals  Vital Signs Type (Include Temp, Pulse, RR, and B/P) Blood transfusion completion (within 30 minutes) Blood transfusion completion (within 30 minutes) Blood transfusion completion (within 30 minutes)  Temp 98.9 F (37.2 C) 98.9 F (37.2 C) 98.6 F (37 C)  Temp Source Oral Oral Oral  Pulse Rate 89 93 92  ECG Heart Rate 91  --   --   Resp (!) 28  --   --   BP (!) 167/71 (!) 158/79 (!) 149/75

## 2022-07-02 NOTE — Progress Notes (Signed)
[facility] Progress Note  Patient ID: [name] MRN: [id number] DOB/AGE: August 25, 1928 88 y.o.  Subjective:  Doing ok  Tolerated transfusion well yesterday   C/o some R wrist and forearm pain today   Placed into bulky compressive dressing to R leg yesterday   ROS As above  Objective:   VITALS:   Vitals:   07/02/22 0440 07/02/22 0654 07/02/22 0820 07/02/22 0841  BP:   108/79 (!) 160/60  Pulse:   96 93  Resp:    20  Temp:   98.2 F (36.8 C) 98.8 F (37.1 C)  TempSrc:   Oral Oral  SpO2:  97% 96% 99%  Weight: 60.2 kg     Height:        Estimated body mass index is 25.08 kg/m as calculated from the following:   Height as of this encounter: '5\' 1"'$  (1.549 m).   Weight as of this encounter: 60.2 kg.   Intake/Output      12/03 0701 12/04 0700 12/04 0701 12/05 0700   P.O. 120 120   I.V. (mL/kg) 69 (1.1)    Blood 678    Total Intake(mL/kg) 867 (14.4) 120 (2)   Urine (mL/kg/hr) 1800 (1.2)    Emesis/NG output 0    Stool 0    Total Output 1800    Net -933 +120          LABS  Results for orders placed or performed during the hospital encounter of 06/30/22 (from the past 24 hour(s))  Glucose, capillary     Status: None   Collection Time: 07/01/22 11:55 AM  Result Value Ref Range   Glucose-Capillary 99 70 - 99 mg/dL  CBC     Status: Abnormal   Collection Time: 07/01/22 12:04 PM  Result Value Ref Range   WBC 8.1 4.0 - 10.5 K/uL   RBC 2.37 (L) 3.87 - 5.11 MIL/uL   Hemoglobin 6.9 (LL) 12.0 - 15.0 g/dL   HCT 22.2 (L) 36.0 - 46.0 %   MCV 93.7 80.0 - 100.0 fL   MCH 29.1 26.0 - 34.0 pg   MCHC 31.1 30.0 - 36.0 g/dL   RDW 16.2 (H) 11.5 - 15.5 %   Platelets 188 150 - 400 K/uL   nRBC 0.0 0.0 - 0.2 %  Prepare RBC (crossmatch)     Status: None   Collection Time: 07/01/22  1:19 PM  Result Value Ref Range   Order Confirmation      ORDER PROCESSED BY BLOOD BANK Performed at [facility], [address] [place], [place] [address]   Glucose, capillary     Status: Abnormal   Collection Time: 07/01/22  4:35 PM  Result Value Ref Range   Glucose-Capillary 141 (H) 70 - 99 mg/dL  Glucose, capillary     Status: Abnormal   Collection Time: 07/01/22  7:52 PM  Result Value Ref Range   Glucose-Capillary 134 (H) 70 - 99 mg/dL  Comprehensive metabolic panel     Status: Abnormal   Collection Time: 07/02/22  3:57 AM  Result Value Ref Range   Sodium 138 135 - 145 mmol/L   Potassium 4.5 3.5 - 5.1 mmol/L   Chloride 107 98 - 111 mmol/L   CO2 20 (L) 22 - 32 mmol/L   Glucose, Bld 132 (H)  70 - 99 mg/dL   BUN 51 (H) 8 - 23 mg/dL   Creatinine, Ser 3.76 (H) 0.44 - 1.00 mg/dL   Calcium 8.6 (L) 8.9 - 10.3 mg/dL   Total Protein 6.1 (L) 6.5 - 8.1 g/dL   Albumin 2.4 (L) 3.5 - 5.0 g/dL   AST 26 15 - 41 U/L   ALT 16 0 - 44 U/L   Alkaline Phosphatase 148 (H) 38 - 126 U/L   Total Bilirubin 0.9 0.3 - 1.2 mg/dL   GFR, Estimated 11 (L) >60 mL/min   Anion gap 11 5 - 15  CBC     Status: Abnormal   Collection Time: 07/02/22  3:57 AM  Result Value Ref Range   WBC 9.3 4.0 - 10.5 K/uL   RBC 2.99 (L) 3.87 - 5.11 MIL/uL   Hemoglobin 9.0 (L) 12.0 - 15.0 g/dL   HCT 25.4 (L) 36.0 - 46.0 %   MCV 84.9 80.0 - 100.0 fL   MCH 30.1 26.0 - 34.0 pg   MCHC 35.4 30.0 - 36.0 g/dL   RDW 16.5 (H) 11.5 - 15.5 %   Platelets 198 150 - 400 K/uL   nRBC 0.0 0.0 - 0.2 %  Glucose, capillary     Status: Abnormal   Collection Time: 07/02/22  8:22 AM  Result Value Ref Range   Glucose-Capillary 140 (H) 70 - 99 mg/dL     PHYSICAL EXAM:   Gen: sitting up in bed, NAD Ext:       Right Lower Extremity   Knee immobilizer in place  + DP pulse  Motor and sensory functions intact and unchanged  No DCT   Compartments are soft         Right Upper Extremity   TTP R wrist and forearm   No obvious swelling   Motor and sensory functions intact  Ext warm   + radial pulse    Assessment/Plan:       Anti-infectives (From admission, onward)    None     .  POD/HD#: 82  88 year old female ground-level fall with right periprosthetic distal femur fracture, acute blood loss anemia, acute on chronic renal insufficiency   -Ground-level fall   -Right periprosthetic distal femur fracture              OR tomorrow at 0800 for ORIF                  Bedrest for now                                       air mattress overlay      Knee immobilizer and compressive dressing     Ice                NwB R leg for now                Weightbearing status postop to be determined  - AKI on CKD   Per nephrology    - Pain management:               Optimize nonnarcotic medications   - ABL anemia/Hemodynamics               s/p 2 units of packed red blood cells     Cbc in am    - Medical issues  Per medical service and nephrology                  Routine decubitus precautions                             Float heels off                             Turn every 2 hours and as needed                 Given presentation to the emergency department as well as air travel given long bone fracture do think that it would be prudent to complete workup for hypoxia prior to going to the OR.  VQ scan has been ordered   - DVT/PE prophylaxis:               Subcu heparin- hold at MN                Given CKD will likely do Eliquis postoperatively   - ID:                Renally dosed perioperative antibiotics   - Metabolic Bone Disease:               Fracture is a fragility fracture based off mechanism               Likely poor bone quality due to age as well as diabetes and CKD               Check vitamin D levels               Optimize nutrition                - Activity:               As above - FEN/GI prophylaxis/Foley/Lines:               Renal diet     NPO after MN    - Impediments to fracture healing:               Diabetes               CKD               Fragility  fracture - Dispo:               Continue medical workup               OR tomorrow     Jari Pigg, PA-C [phone] (C) 07/02/2022, 10:20 AM  [facility] [place] [phone] Jenetta [phone] (F)    After 5pm and on the weekends please log on to Amion, go to orthopaedics and the look under the [facility] Call for the provider(s) on call. You can also call our office at [phone] and then follow the prompts to be connected to the call team.  Patient ID: Norva Pavlov, female   DOB: Jul 24, 1929, 88 y.o.   MRN: [id number]

## 2022-07-02 NOTE — Anesthesia Preprocedure Evaluation (Signed)
Anesthesia Evaluation  Patient identified by MRN, date of birth, ID band Patient awake and Patient confused    Reviewed: Allergy & Precautions, NPO status , Patient's Chart, lab work & pertinent test results  History of Anesthesia Complications Negative for: history of anesthetic complications  Airway Mallampati: II  TM Distance: >3 FB Neck ROM: Full    Dental  (+) Edentulous Lower, Edentulous Upper   Pulmonary former smoker   Pulmonary exam normal        Cardiovascular hypertension, Pt. on medications +CHF  Normal cardiovascular exam     Neuro/Psych Seizures - (20 years ago), Well Controlled,       Dementia    GI/Hepatic Neg liver ROS,GERD  Medicated,,  Endo/Other  diabetes, Type 2, Insulin Dependent    Renal/GU Renal Insufficiency and ARFRenal disease  negative genitourinary   Musculoskeletal negative musculoskeletal ROS (+)  Right periprosthetic distal femur fracture   Abdominal   Peds  Hematology  (+) Blood dyscrasia (Hgb 9.6), anemia   Anesthesia Other Findings Day of surgery medications reviewed with patient.  Reproductive/Obstetrics negative OB ROS                             Anesthesia Physical Anesthesia Plan  ASA: 3  Anesthesia Plan: General   Post-op Pain Management: Ofirmev IV (intra-op)*   Induction: Intravenous  PONV Risk Score and Plan: 3 and Treatment may vary due to age or medical condition, Ondansetron, Dexamethasone, Propofol infusion and TIVA  Airway Management Planned: Oral ETT  Additional Equipment: None  Intra-op Plan:   Post-operative Plan: Extubation in OR  Informed Consent: I have reviewed the patients History and Physical, chart, labs and discussed the procedure including the risks, benefits and alternatives for the proposed anesthesia with the patient or authorized representative who has indicated his/her understanding and acceptance.      Dental advisory given and Consent reviewed with POA  Plan Discussed with: CRNA  Anesthesia Plan Comments:        Anesthesia Quick Evaluation

## 2022-07-02 NOTE — Progress Notes (Signed)
Ortho Trauma f/u note  Xrays of right wrist and forearm are negative for acute fracture R shoulder visualized on CXR and no evidence of fracture there  Pt has degenerative changes to R distal wrist and hand as well as shoulder  No acute intervention needed Symptomatic management   Jari Pigg, PA-C [phone] (C) 07/02/2022, 2:49 PM  [facility] [place] [phone] [name] (F)       Patient ID: [name], female   DOB: [birth year], 88 y.o.   MRN: [id number]

## 2022-07-02 NOTE — Progress Notes (Signed)
Subjective:  ortho planning for surgery tomorrow on leg-  hemodynamically stable-  if anything BP is high-  1800 UOP and crt down-  caregiver said she ate all of her breakfast- may have something going on with her right arm as well as that leg  Objective Vital signs in last 24 hours: Vitals:   07/02/22 0440 07/02/22 0654 07/02/22 0820 07/02/22 0841  BP:   108/79 (!) 160/60  Pulse:   96 93  Resp:    20  Temp:   98.2 F (36.8 C) 98.8 F (37.1 C)  TempSrc:   Oral Oral  SpO2:  97% 96% 99%  Weight: 60.2 kg     Height:       Weight change: 1.1 kg  Intake/Output Summary (Last 24 hours) at 07/02/2022 1030 Last data filed at 07/02/2022 0900 Gross per 24 hour  Intake 987 ml  Output 1800 ml  Net -813 ml    Assessment/ Plan: Pt is a 88 y.o. yo female who was admitted on 06/30/2022 with mechanical fall with fracture-  has some A on CRF Assessment/Plan: 1. Renal-   baseline CKD with crt looks like mid to high 1's-  history of AKI in August as well.  Non oliguric and crt is trending down last 24 hours.  Initially urine with hematuria and proteinuria-  really not candidate for aggressive work up for this.  U/S just c/w chronic medical renal dz.  This A on CRF felt due to ATN-  thankfully has improved the last 24 hours.  Continue to avoid any nephrotoxins.  Would not consider her a candidate for dialysis based on age and dementia-  thankfully though kidney function is trending better  2. Femur fracture-  req surgical stabilization-  planning for tomorrow   3. Anemia- situational and maybe some anemia of CKD-  given 2 units PRBCs yesterday-  will give dose of ESA as well   4. HTN/volume-  BP variable-  has an order for IVF but not currently getting-  I think is OK to just leave off for now-  no BP meds   Louis Meckel    Labs: Basic Metabolic Panel: Recent Labs  Lab 06/30/22 1906 07/01/22 0122 07/02/22 0357  NA 137 136 138  K 4.6 4.9 4.5  CL 106 107 107  CO2 21* 18* 20*  GLUCOSE  169* 117* 132*  BUN 46* 49* 51*  CREATININE 4.45* 4.69* 3.76*  CALCIUM 8.3* 8.1* 8.6*   Liver Function Tests: Recent Labs  Lab 06/30/22 1906 07/02/22 0357  AST 29 26  ALT 18 16  ALKPHOS 147* 148*  BILITOT 0.4 0.9  PROT 6.1* 6.1*  ALBUMIN 2.5* 2.4*   No results for input(s): "LIPASE", "AMYLASE" in the last 168 hours. No results for input(s): "AMMONIA" in the last 168 hours. CBC: Recent Labs  Lab 06/30/22 1906 06/30/22 2116 07/01/22 0122 07/01/22 0706 07/01/22 1204 07/02/22 0357  WBC 10.3 10.4 9.8 9.0 8.1 9.3  NEUTROABS 8.3*  --   --   --   --   --   HGB 8.3* 7.5* 7.2* 7.5* 6.9* 9.0*  HCT 25.4* 23.2* 23.1* 23.5* 22.2* 25.4*  MCV 90.7 90.6 92.8 92.5 93.7 84.9  PLT 204 195 210 196 188 198   Cardiac Enzymes: No results for input(s): "CKTOTAL", "CKMB", "CKMBINDEX", "TROPONINI" in the last 168 hours. CBG: Recent Labs  Lab 07/01/22 0755 07/01/22 1155 07/01/22 1635 07/01/22 1952 07/02/22 0822  GLUCAP 96 99 141* 134* 140*  Iron Studies:  Recent Labs    06/30/22 2116  IRON 20*  TIBC 216*  FERRITIN 111   Studies/Results: DG Chest 1 View  Result Date: 07/02/2022 CLINICAL DATA:  [id number] Repeat prescription issue [id number] EXAM: CHEST  1 VIEW COMPARISON:  06/30/2022 chest radiograph. FINDINGS: Surgical clips again noted in the left axilla. Stable cardiomediastinal silhouette with mild cardiomegaly. No pneumothorax. No pleural effusion. No overt pulmonary edema. Mild streaky bibasilar scarring versus atelectasis, unchanged. IMPRESSION: 1. Stable mild cardiomegaly without overt pulmonary edema. 2. Stable mild streaky bibasilar scarring versus atelectasis. Electronically Signed   By: Ilona Sorrel M.D.   On: 07/02/2022 09:26   US RENAL  Result Date: 07/01/2022 CLINICAL DATA:  Acute on chronic renal failure with stage IV CKD. EXAM: RENAL / URINARY TRACT ULTRASOUND COMPLETE COMPARISON:  Renal ultrasound 03/11/2022. FINDINGS: Right Kidney: Renal measurements: 10.3 x 4.1 x 4.5  cm = volume: 99.4 mL, previously 93.9 mL but probably unchanged accounting for technical factors. There is mild-to-moderate increased cortical echogenicity, mild cortical volume loss. No mass, stones or hydronephrosis visualized. There is a 1.2 cm simple cyst in the outer upper pole, and a 1.2 cm simple parapelvic cyst at the midpole. No new abnormality. Left Kidney: Renal measurements: 12.0 x 7.2 x 5.2 cm = volume: 232.6 mL, but probably overestimated as the AP axis was measured from the hilum to the outer aspect of a nearly 5 cm cyst. Echogenicity is increased. 4.8 cm mid/lower pole simple cyst is again shown. No mass, stones or hydronephrosis visualized. Bladder: Catheterized, contracted and obscured by bowel gas. Other: None. IMPRESSION: 1. No acute findings. No hydronephrosis. 2. Findings of chronic medical renal disease. 3. Bilateral renal cysts. 4. Accounting for technical factors there is no appreciable interval change from 03/11/2022. Electronically Signed   By: Telford Nab M.D.   On: 07/01/2022 23:59   CT HEAD WO CONTRAST (5MM)  Result Date: 07/01/2022 CLINICAL DATA:  Head trauma EXAM: CT HEAD WITHOUT CONTRAST TECHNIQUE: Contiguous axial images were obtained from the base of the skull through the vertex without intravenous contrast. RADIATION DOSE REDUCTION: This exam was performed according to the departmental dose-optimization program which includes automated exposure control, adjustment of the mA and/or kV according to patient size and/or use of iterative reconstruction technique. COMPARISON:  None Available. FINDINGS: Brain: There is no mass, hemorrhage or extra-axial collection. The size and configuration of the ventricles and extra-axial CSF spaces are normal. There is hypoattenuation of the white matter, most commonly indicating chronic small vessel disease. Vascular: No abnormal hyperdensity of the major intracranial arteries or dural venous sinuses. No intracranial atherosclerosis. Skull:  The visualized skull base, calvarium and extracranial soft tissues are normal. Sinuses/Orbits: No fluid levels or advanced mucosal thickening of the visualized paranasal sinuses. No mastoid or middle ear effusion. The orbits are normal. IMPRESSION: 1. No acute intracranial abnormality. 2. Chronic small vessel disease. Electronically Signed   By: Ulyses Jarred M.D.   On: 07/01/2022 21:43   DG HIP UNILAT WITH PELVIS 1V RIGHT  Result Date: 06/30/2022 CLINICAL DATA:  Closed fracture distal femur.  [id number]. EXAM: DG HIP (WITH OR WITHOUT PELVIS) 1V RIGHT; RIGHT KNEE - 1-2 VIEW COMPARISON:  Immediate postoperative films following right knee replacement 11/24/2008, abdomen film including the pelvis and hips on 10/13/2020. FINDINGS: AP pelvis and separate AP right hip view: Osteopenia. No displaced fracture is seen of the pelvis and proximal femurs. Symmetric joint space loss is again noted both superior hips, small acetabular osteophytes and mild  pelvic enthesopathy with spurring at the SI joints. Intestinal postsurgical changes are noted in the left lower abdomen, as before. Single surgical clip at the level of the mid right hemisacrum. 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 Right knee AP Lat only, portable: Osteopenia. 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 There is a markedly comminuted distal femoral shaft fracture extending to the metaphyseal bone alongside the hardware. There is a mild spreading of the comminution fragments, the main distal fragment posteriorly displaced about 1/2 of a shaft width, laterally displaced about 1/3 of a shaft width with slight apex dorsal angulation and a slight valgus angulation. There are calcifications in the femoral, popliteal and popliteal trifurcation arteries. There is no further evidence of fractures. IMPRESSION: 1. Osteopenia and degenerative change without evidence of displaced fracture of the pelvis and proximal femurs. 2.  Markedly comminuted distal femoral shaft fracture extending to the metaphyseal bone alongside the TKA hardware, with a mild spreading of the comminution fragments, the main distal fragment posteriorly displaced about 1/2 of a shaft width, laterally displaced 1/3 of a shaft width with slight apex dorsal and valgus angulation. Electronically Signed   By: Telford Nab M.D.   On: 06/30/2022 20:54   DG Knee 1-2 Views Right  Result Date: 06/30/2022 CLINICAL DATA:  Closed fracture distal femur.  [id number]. EXAM: DG HIP (WITH OR WITHOUT PELVIS) 1V RIGHT; RIGHT KNEE - 1-2 VIEW COMPARISON:  Immediate postoperative films following right knee replacement 11/24/2008, abdomen film including the pelvis and hips on 10/13/2020. FINDINGS: AP pelvis and separate AP right hip view: Osteopenia. No displaced fracture is seen of the pelvis and proximal femurs. Symmetric joint space loss is again noted both superior hips, small acetabular osteophytes and mild pelvic enthesopathy with spurring at the SI joints. Intestinal postsurgical changes are noted in the left lower abdomen, as before. Single surgical clip at the level of the mid right hemisacrum. 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 Right knee AP Lat only, portable: Osteopenia. 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 There is a markedly comminuted distal femoral shaft fracture extending to the metaphyseal bone alongside the hardware. There is a mild spreading of the comminution fragments, the main distal fragment posteriorly displaced about 1/2 of a shaft width, laterally displaced about 1/3 of a shaft width with slight apex dorsal angulation and a slight valgus angulation. There are calcifications in the femoral, popliteal and popliteal trifurcation arteries. There is no further evidence of fractures. IMPRESSION: 1. Osteopenia and degenerative change without evidence of displaced fracture of the pelvis and proximal femurs. 2.  Markedly comminuted distal femoral shaft fracture extending to the metaphyseal bone alongside the TKA hardware, with a mild spreading of the comminution fragments, the main distal fragment posteriorly displaced about 1/2 of a shaft width, laterally displaced 1/3 of a shaft width with slight apex dorsal and valgus angulation. Electronically Signed   By: Telford Nab M.D.   On: 06/30/2022 20:54   DG CHEST PORT 1 VIEW  Result Date: 06/30/2022 CLINICAL DATA:  Hypoxia EXAM: PORTABLE CHEST 1 VIEW COMPARISON:  Chest x-ray 03/10/2022 FINDINGS: Heart is enlarged, unchanged. There is some scattered interstitial opacities which may be chronic. There is no focal consolidation, pleural effusion or pneumothorax. Left axillary surgical clips are present. There are atherosclerotic calcifications of the aorta. There severe degenerative changes of both shoulders. IMPRESSION: 1. Cardiomegaly. 2. Scattered interstitial opacities which may be chronic. No focal consolidation. Electronically Signed   By: Ronney Asters  M.D.   On: 06/30/2022 19:21   Medications: Infusions:  sodium chloride Stopped (07/01/22 2138)   methocarbamol (ROBAXIN) IV      Scheduled Medications:  sodium chloride   Intravenous Once   acetaminophen  650 mg Oral Q6H   allopurinol  100 mg Oral BID   feeding supplement (NEPRO CARB STEADY)  237 mL Oral BID BM   heparin injection (subcutaneous)  5,000 Units Subcutaneous Q8H   insulin aspart  0-9 Units Subcutaneous TID WC   levETIRAcetam  500 mg Oral q AM   mirtazapine  15 mg Oral QHS   multivitamin  1 tablet Oral QHS   pantoprazole (PROTONIX) IV  40 mg Intravenous Q24H   QUEtiapine  25 mg Oral QHS    have reviewed scheduled and prn medications.  Physical Exam: General:  NAD- seems frightened-  caregiver in room Heart:slightly tachy Lungs: clear Abdomen: soft, non tender Extremities: no edema     07/02/2022,10:30 AM  LOS: 2 days

## 2022-07-02 NOTE — Progress Notes (Addendum)
PROGRESS NOTE    [name]  HKV:[id number] DOB: 07-27-1929 DOA: 06/30/2022 PCP: Seward Carol, MD   Brief Narrative:  88 year old female with medical history of hypertension, diabetes mellitus type 2, stage IV CKD, dementia, diastolic CHF, GERD who was on vacation with her daughter in [place] when she suffered a mechanical fall.  Patient went to balcony when she missed a step and fell down.  Her daughter found her on her back with right leg shortened and on outwards.  She was transferred to hospital.  Also surgeons at [place] told her that was a complex fracture and did not think it was safe to travel home.  Her daughter medi vaced her out today and travel time was 3 hours and 6 minutes. There is no paper chart.  During hospitalization she was found to have right distal femur periprosthetic fracture, acute anemia and off-and-on agitation/hallucination.   Assessment & Plan:  Principal Problem:   Closed fracture of right distal femur ([place]) Active Problems:   Acute respiratory failure with hypoxia (HCC)   Acute renal failure superimposed on stage 4 chronic kidney disease (HCC)   Acute on chronic anemia   Chronic diastolic CHF (congestive heart failure) (HCC)   Type 2 diabetes mellitus with complication, without long-term current use of insulin (HCC)   Hypertension   GERD (gastroesophageal reflux disease)   history of seizures   Dementia (HCC)       Right distal femur periprosthetic fracture Mechanical fall -Sustained fall in [place].  For now conservative management, pain control, supportive care.  Ortho planning on surgical stabilization in the OR on 12/5. -Check vitamin D levels -CT head negative   Acute hypoxemic respiratory failure -Elevated D-dimer, I suspect this is from atelectasis, seen on chest x-ray.  Low suspicion for PE therefore for now we will discontinue VQ scan.  Incentive spirometer and flutter valve.  She is already down to 2 L nasal cannula saturating  100% therefore advised nursing staff to wean her off   Iron deficiency anemia/acute on chronic anemia -Baseline hemoglobin 9, admission hemoglobin less than 7.  Status post transfusion, hemoglobin today 9.0 -Low iron saturation, eventually iron supplements   Acute kidney injury on CKD stage III -Patient's baseline creatinine 1.47 as of 03/15/2022.  Admission creatinine 4.45, with IV fluids today is 3.76.  Renal ultrasound negative  Bacteriuria, urinary urgency Positive dipstick - Given off-and-on hallucination.  Will empirically start on IV Rocephin.  Check CK levels     History of seizures -Has been seizure-free for many years.  Continue Keppra.  This can be changed to IV if NPO   Dementia without behavior disturbance -Continue Remeron, Seroquel nightly   Diabetes mellitus type 2 -Hemoglobin A1c 6.4 -Sliding scale and Accu-Cheks    DVT prophylaxis: Subcu heparin Code Status: Full code Family Communication: Daughter at bedside  Status is: Inpatient Remains inpatient appropriate because: Plans for surgical repair for femur fracture tomorrow.  Maintain hospital stay   Nutritional status    Signs/Symptoms: estimated needs  Interventions: Nepro shake, MVI  Body mass index is 25.08 kg/m.         Subjective: Patient seen and examined at bedside.  Daughter is also present there.  Patient tells me at rest she does not have any shortness of breath but at times she does cough especially when asked to take deep breaths.  She is saturating greater than 98% on 2 L nasal cannula.   Examination:  General exam: Appears calm and  comfortable  Respiratory system: Minimal bibasilar rhonchi Cardiovascular system: S1 & S2 heard, RRR. No JVD, murmurs, rubs, gallops or clicks. No pedal edema. Gastrointestinal system: Abdomen is nondistended, soft and nontender. No organomegaly or masses felt. Normal bowel sounds heard. Central nervous system: Alert and oriented. No focal  neurological deficits. Extremities: Limited range of motion especially of the right lower extremity Skin: No rashes, lesions or ulcers Psychiatry: Judgement and insight appear normal. Mood & affect appropriate.     Objective: Vitals:   07/02/22 0440 07/02/22 0654 07/02/22 0820 07/02/22 0841  BP:   108/79 (!) 160/60  Pulse:   96 93  Resp:    20  Temp:   98.2 F (36.8 C) 98.8 F (37.1 C)  TempSrc:   Oral Oral  SpO2:  97% 96% 99%  Weight: 60.2 kg     Height:        Intake/Output Summary (Last 24 hours) at 07/02/2022 1006 Last data filed at 07/02/2022 0900 Gross per 24 hour  Intake 987 ml  Output 1800 ml  Net -813 ml   Filed Weights   06/30/22 1700 07/01/22 2215 07/02/22 0440  Weight: 59.1 kg 60.2 kg 60.2 kg     Data Reviewed:   CBC: Recent Labs  Lab 06/30/22 1906 06/30/22 2116 07/01/22 0122 07/01/22 0706 07/01/22 1204 07/02/22 0357  WBC 10.3 10.4 9.8 9.0 8.1 9.3  NEUTROABS 8.3*  --   --   --   --   --   HGB 8.3* 7.5* 7.2* 7.5* 6.9* 9.0*  HCT 25.4* 23.2* 23.1* 23.5* 22.2* 25.4*  MCV 90.7 90.6 92.8 92.5 93.7 84.9  PLT 204 195 210 196 188 144   Basic Metabolic Panel: Recent Labs  Lab 06/30/22 1906 07/01/22 0122 07/02/22 0357  NA 137 136 138  K 4.6 4.9 4.5  CL 106 107 107  CO2 21* 18* 20*  GLUCOSE 169* 117* 132*  BUN 46* 49* 51*  CREATININE 4.45* 4.69* 3.76*  CALCIUM 8.3* 8.1* 8.6*   GFR: Estimated Creatinine Clearance: 8.1 mL/min (A) (by C-G formula based on SCr of 3.76 mg/dL (H)). Liver Function Tests: Recent Labs  Lab 06/30/22 1906 07/02/22 0357  AST 29 26  ALT 18 16  ALKPHOS 147* 148*  BILITOT 0.4 0.9  PROT 6.1* 6.1*  ALBUMIN 2.5* 2.4*   No results for input(s): "LIPASE", "AMYLASE" in the last 168 hours. No results for input(s): "AMMONIA" in the last 168 hours. Coagulation Profile: Recent Labs  Lab 06/30/22 1906  INR 1.3*   Cardiac Enzymes: No results for input(s): "CKTOTAL", "CKMB", "CKMBINDEX", "TROPONINI" in the last 168  hours. BNP (last 3 results) No results for input(s): "PROBNP" in the last 8760 hours. HbA1C: No results for input(s): "HGBA1C" in the last 72 hours. CBG: Recent Labs  Lab 07/01/22 0755 07/01/22 1155 07/01/22 1635 07/01/22 1952 07/02/22 0822  GLUCAP 96 99 141* 134* 140*   Lipid Profile: No results for input(s): "CHOL", "HDL", "LDLCALC", "TRIG", "CHOLHDL", "LDLDIRECT" in the last 72 hours. Thyroid Function Tests: No results for input(s): "TSH", "T4TOTAL", "FREET4", "T3FREE", "THYROIDAB" in the last 72 hours. Anemia Panel: Recent Labs    06/30/22 2116  FERRITIN 111  TIBC 216*  IRON 20*   Sepsis Labs: No results for input(s): "PROCALCITON", "LATICACIDVEN" in the last 168 hours.  No results found for this or any previous visit (from the past 240 hour(s)).       Radiology Studies: DG Chest 1 View  Result Date: 07/02/2022 CLINICAL DATA:  [id number] Repeat  prescription issue [id number] EXAM: CHEST  1 VIEW COMPARISON:  06/30/2022 chest radiograph. FINDINGS: Surgical clips again noted in the left axilla. Stable cardiomediastinal silhouette with mild cardiomegaly. No pneumothorax. No pleural effusion. No overt pulmonary edema. Mild streaky bibasilar scarring versus atelectasis, unchanged. IMPRESSION: 1. Stable mild cardiomegaly without overt pulmonary edema. 2. Stable mild streaky bibasilar scarring versus atelectasis. Electronically Signed   By: Ilona Sorrel M.D.   On: 07/02/2022 09:26   US RENAL  Result Date: 07/01/2022 CLINICAL DATA:  Acute on chronic renal failure with stage IV CKD. EXAM: RENAL / URINARY TRACT ULTRASOUND COMPLETE COMPARISON:  Renal ultrasound 03/11/2022. FINDINGS: Right Kidney: Renal measurements: 10.3 x 4.1 x 4.5 cm = volume: 99.4 mL, previously 93.9 mL but probably unchanged accounting for technical factors. There is mild-to-moderate increased cortical echogenicity, mild cortical volume loss. No mass, stones or hydronephrosis visualized. There is a 1.2 cm simple cyst in  the outer upper pole, and a 1.2 cm simple parapelvic cyst at the midpole. No new abnormality. Left Kidney: Renal measurements: 12.0 x 7.2 x 5.2 cm = volume: 232.6 mL, but probably overestimated as the AP axis was measured from the hilum to the outer aspect of a nearly 5 cm cyst. Echogenicity is increased. 4.8 cm mid/lower pole simple cyst is again shown. No mass, stones or hydronephrosis visualized. Bladder: Catheterized, contracted and obscured by bowel gas. Other: None. IMPRESSION: 1. No acute findings. No hydronephrosis. 2. Findings of chronic medical renal disease. 3. Bilateral renal cysts. 4. Accounting for technical factors there is no appreciable interval change from 03/11/2022. Electronically Signed   By: Telford Nab M.D.   On: 07/01/2022 23:59   CT HEAD WO CONTRAST (5MM)  Result Date: 07/01/2022 CLINICAL DATA:  Head trauma EXAM: CT HEAD WITHOUT CONTRAST TECHNIQUE: Contiguous axial images were obtained from the base of the skull through the vertex without intravenous contrast. RADIATION DOSE REDUCTION: This exam was performed according to the departmental dose-optimization program which includes automated exposure control, adjustment of the mA and/or kV according to patient size and/or use of iterative reconstruction technique. COMPARISON:  None Available. FINDINGS: Brain: There is no mass, hemorrhage or extra-axial collection. The size and configuration of the ventricles and extra-axial CSF spaces are normal. There is hypoattenuation of the white matter, most commonly indicating chronic small vessel disease. Vascular: No abnormal hyperdensity of the major intracranial arteries or dural venous sinuses. No intracranial atherosclerosis. Skull: The visualized skull base, calvarium and extracranial soft tissues are normal. Sinuses/Orbits: No fluid levels or advanced mucosal thickening of the visualized paranasal sinuses. No mastoid or middle ear effusion. The orbits are normal. IMPRESSION: 1. No acute  intracranial abnormality. 2. Chronic small vessel disease. Electronically Signed   By: Ulyses Jarred M.D.   On: 07/01/2022 21:43   DG HIP UNILAT WITH PELVIS 1V RIGHT  Result Date: 06/30/2022 CLINICAL DATA:  Closed fracture distal femur.  [id number]. EXAM: DG HIP (WITH OR WITHOUT PELVIS) 1V RIGHT; RIGHT KNEE - 1-2 VIEW COMPARISON:  Immediate postoperative films following right knee replacement 11/24/2008, abdomen film including the pelvis and hips on 10/13/2020. FINDINGS: AP pelvis and separate AP right hip view: Osteopenia. No displaced fracture is seen of the pelvis and proximal femurs. Symmetric joint space loss is again noted both superior hips, small acetabular osteophytes and mild pelvic enthesopathy with spurring at the SI joints. Intestinal postsurgical changes are noted in the left lower abdomen, as before. Single surgical clip at the level of the mid right hemisacrum. There is iliofemoral calcific  arteriosclerosis. Right knee AP Lat only, portable: Osteopenia. 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 There is a markedly comminuted distal femoral shaft fracture extending to the metaphyseal bone alongside the hardware. There is a mild spreading of the comminution fragments, the main distal fragment posteriorly displaced about 1/2 of a shaft width, laterally displaced about 1/3 of a shaft width with slight apex dorsal angulation and a slight valgus angulation. There are calcifications in the femoral, popliteal and popliteal trifurcation arteries. There is no further evidence of fractures. IMPRESSION: 1. Osteopenia and degenerative change without evidence of displaced fracture of the pelvis and proximal femurs. 2. Markedly comminuted distal femoral shaft fracture extending to the metaphyseal bone alongside the TKA hardware, with a mild spreading of the comminution fragments, the main distal fragment posteriorly displaced about 1/2 of a shaft width, laterally displaced 1/3  of a shaft width with slight apex dorsal and valgus angulation. Electronically Signed   By: Telford Nab M.D.   On: 06/30/2022 20:54   DG Knee 1-2 Views Right  Result Date: 06/30/2022 CLINICAL DATA:  Closed fracture distal femur.  [id number]. EXAM: DG HIP (WITH OR WITHOUT PELVIS) 1V RIGHT; RIGHT KNEE - 1-2 VIEW COMPARISON:  Immediate postoperative films following right knee replacement 11/24/2008, abdomen film including the pelvis and hips on 10/13/2020. FINDINGS: AP pelvis and separate AP right hip view: Osteopenia. No displaced fracture is seen of the pelvis and proximal femurs. Symmetric joint space loss is again noted both superior hips, small acetabular osteophytes and mild pelvic enthesopathy with spurring at the SI joints. Intestinal postsurgical changes are noted in the left lower abdomen, as before. Single surgical clip at the level of the mid right hemisacrum. 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 Right knee AP Lat only, portable: Osteopenia. 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 There is a markedly comminuted distal femoral shaft fracture extending to the metaphyseal bone alongside the hardware. There is a mild spreading of the comminution fragments, the main distal fragment posteriorly displaced about 1/2 of a shaft width, laterally displaced about 1/3 of a shaft width with slight apex dorsal angulation and a slight valgus angulation. There are calcifications in the femoral, popliteal and popliteal trifurcation arteries. There is no further evidence of fractures. IMPRESSION: 1. Osteopenia and degenerative change without evidence of displaced fracture of the pelvis and proximal femurs. 2. Markedly comminuted distal femoral shaft fracture extending to the metaphyseal bone alongside the TKA hardware, with a mild spreading of the comminution fragments, the main distal fragment posteriorly displaced about 1/2 of a shaft width, laterally displaced 1/3 of a  shaft width with slight apex dorsal and valgus angulation. Electronically Signed   By: Telford Nab M.D.   On: 06/30/2022 20:54   DG CHEST PORT 1 VIEW  Result Date: 06/30/2022 CLINICAL DATA:  Hypoxia EXAM: PORTABLE CHEST 1 VIEW COMPARISON:  Chest x-ray 03/10/2022 FINDINGS: Heart is enlarged, unchanged. There is some scattered interstitial opacities which may be chronic. There is no focal consolidation, pleural effusion or pneumothorax. Left axillary surgical clips are present. There are atherosclerotic calcifications of the aorta. There severe degenerative changes of both shoulders. IMPRESSION: 1. Cardiomegaly. 2. Scattered interstitial opacities which may be chronic. No focal consolidation. Electronically Signed   By: Ronney Asters M.D.   On: 06/30/2022 19:21        Scheduled Meds:  sodium chloride   Intravenous Once   acetaminophen  650 mg Oral Q6H   allopurinol  100  mg Oral BID   feeding supplement (NEPRO CARB STEADY)  237 mL Oral BID BM   heparin injection (subcutaneous)  5,000 Units Subcutaneous Q8H   insulin aspart  0-9 Units Subcutaneous TID WC   levETIRAcetam  500 mg Oral q AM   mirtazapine  15 mg Oral QHS   multivitamin  1 tablet Oral QHS   pantoprazole (PROTONIX) IV  40 mg Intravenous Q24H   QUEtiapine  25 mg Oral QHS   Continuous Infusions:  sodium chloride Stopped (07/01/22 2138)   methocarbamol (ROBAXIN) IV       LOS: 2 days   Time spent= 35 mins    Azaiah Licciardi Arsenio Loader, MD [facility]  If 7PM-7AM, please contact night-coverage  07/02/2022, 10:06 AM

## 2022-07-02 NOTE — Progress Notes (Signed)
Blood transfusion complete no adverse effects. Tolerated infusion w/o issues.

## 2022-07-03 ENCOUNTER — Inpatient Hospital Stay (HOSPITAL_COMMUNITY): Payer: PPO | Admitting: Anesthesiology

## 2022-07-03 ENCOUNTER — Encounter (HOSPITAL_COMMUNITY): Payer: Self-pay | Admitting: Family Medicine

## 2022-07-03 ENCOUNTER — Encounter (HOSPITAL_COMMUNITY)
Admission: RE | Disposition: A | Discharge status: Skilled Nursing Facility | Payer: Self-pay | Source: Home / Self Care | Attending: Family Medicine

## 2022-07-03 ENCOUNTER — Other Ambulatory Visit: Payer: Self-pay

## 2022-07-03 ENCOUNTER — Inpatient Hospital Stay (HOSPITAL_COMMUNITY): Payer: PPO

## 2022-07-03 DIAGNOSIS — I509 Heart failure, unspecified: Secondary | ICD-10-CM

## 2022-07-03 DIAGNOSIS — Z87891 Personal history of nicotine dependence: Secondary | ICD-10-CM

## 2022-07-03 DIAGNOSIS — S72401A Unspecified fracture of lower end of right femur, initial encounter for closed fracture: Secondary | ICD-10-CM

## 2022-07-03 DIAGNOSIS — I110 Hypertensive heart disease with heart failure: Secondary | ICD-10-CM | POA: Diagnosis not present

## 2022-07-03 DIAGNOSIS — S72491A Other fracture of lower end of right femur, initial encounter for closed fracture: Secondary | ICD-10-CM | POA: Diagnosis not present

## 2022-07-03 HISTORY — PX: ORIF FEMUR FRACTURE: SHX2119

## 2022-07-03 LAB — GLUCOSE, CAPILLARY
Glucose-Capillary: 137 mg/dL — ABNORMAL HIGH (ref 70–99)
Glucose-Capillary: 147 mg/dL — ABNORMAL HIGH (ref 70–99)
Glucose-Capillary: 179 mg/dL — ABNORMAL HIGH (ref 70–99)
Glucose-Capillary: 194 mg/dL — ABNORMAL HIGH (ref 70–99)
Glucose-Capillary: 202 mg/dL — ABNORMAL HIGH (ref 70–99)

## 2022-07-03 LAB — BASIC METABOLIC PANEL
Anion gap: 9 (ref 5–15)
BUN: 40 mg/dL — ABNORMAL HIGH (ref 8–23)
CO2: 20 mmol/L — ABNORMAL LOW (ref 22–32)
Calcium: 8.6 mg/dL — ABNORMAL LOW (ref 8.9–10.3)
Chloride: 107 mmol/L (ref 98–111)
Creatinine, Ser: 2.52 mg/dL — ABNORMAL HIGH (ref 0.44–1.00)
GFR, Estimated: 18 mL/min — ABNORMAL LOW (ref >60)
Glucose, Bld: 160 mg/dL — ABNORMAL HIGH (ref 70–99)
Potassium: 3.9 mmol/L (ref 3.5–5.1)
Sodium: 136 mmol/L (ref 135–145)

## 2022-07-03 LAB — CBC
HCT: 28.9 % — ABNORMAL LOW (ref 36.0–46.0)
Hemoglobin: 9.6 g/dL — ABNORMAL LOW (ref 12.0–15.0)
MCH: 29.1 pg (ref 26.0–34.0)
MCHC: 33.2 g/dL (ref 30.0–36.0)
MCV: 87.6 fL (ref 80.0–100.0)
Platelets: 206 10*3/uL (ref 150–400)
RBC: 3.3 MIL/uL — ABNORMAL LOW (ref 3.87–5.11)
RDW: 16.6 % — ABNORMAL HIGH (ref 11.5–15.5)
WBC: 7.4 10*3/uL (ref 4.0–10.5)
nRBC: 0 % (ref 0.0–0.2)

## 2022-07-03 LAB — PROCALCITONIN: Procalcitonin: <0.1 ng/mL

## 2022-07-03 LAB — MAGNESIUM: Magnesium: 1.5 mg/dL — ABNORMAL LOW (ref 1.7–2.4)

## 2022-07-03 LAB — BRAIN NATRIURETIC PEPTIDE: B Natriuretic Peptide: 309.9 pg/mL — ABNORMAL HIGH (ref 0.0–100.0)

## 2022-07-03 SURGERY — OPEN REDUCTION INTERNAL FIXATION (ORIF) DISTAL FEMUR FRACTURE
Anesthesia: General | Site: Leg Upper | Laterality: Right

## 2022-07-03 MED ORDER — FENTANYL CITRATE (PF) 250 MCG/5ML IJ SOLN
INTRAMUSCULAR | Status: AC
  Filled 2022-07-03: qty 5

## 2022-07-03 MED ORDER — OXYCODONE HCL 5 MG PO TABS: 5.0000 mg | ORAL_TABLET | Freq: Once | ORAL | Status: DC | PRN

## 2022-07-03 MED ORDER — PHENOL 1.4 % MT LIQD: 1.0000 | OROMUCOSAL | Status: DC | PRN

## 2022-07-03 MED ORDER — PROPOFOL 10 MG/ML IV BOLUS
INTRAVENOUS | Status: DC | PRN
  Administered 2022-07-03: 80 mg via INTRAVENOUS

## 2022-07-03 MED ORDER — ALBUMIN HUMAN 5 % IV SOLN: INTRAVENOUS | Status: DC | PRN

## 2022-07-03 MED ORDER — SUGAMMADEX SODIUM 200 MG/2ML IV SOLN
INTRAVENOUS | Status: DC | PRN
  Administered 2022-07-03: 150 mg via INTRAVENOUS

## 2022-07-03 MED ORDER — HEPARIN SODIUM (PORCINE) 5000 UNIT/ML IJ SOLN
5000.0000 [IU] | Freq: Three times a day (TID) | INTRAMUSCULAR | Status: DC
  Administered 2022-07-03 – 2022-07-11 (×22): 5000 [IU] via SUBCUTANEOUS
  Filled 2022-07-03 (×23): qty 1

## 2022-07-03 MED ORDER — ROCURONIUM BROMIDE 10 MG/ML (PF) SYRINGE
PREFILLED_SYRINGE | INTRAVENOUS | Status: DC | PRN
  Administered 2022-07-03: 50 mg via INTRAVENOUS

## 2022-07-03 MED ORDER — DOCUSATE SODIUM 100 MG PO CAPS
100.0000 mg | ORAL_CAPSULE | Freq: Two times a day (BID) | ORAL | Status: DC
  Administered 2022-07-03 – 2022-07-11 (×14): 100 mg via ORAL
  Filled 2022-07-03 (×15): qty 1

## 2022-07-03 MED ORDER — ESMOLOL HCL 100 MG/10ML IV SOLN
INTRAVENOUS | Status: AC
  Filled 2022-07-03: qty 10

## 2022-07-03 MED ORDER — INSULIN ASPART 100 UNIT/ML IJ SOLN: 0.0000 [IU] | INTRAMUSCULAR | Status: DC | PRN

## 2022-07-03 MED ORDER — ONDANSETRON HCL 4 MG/2ML IJ SOLN: 4.0000 mg | Freq: Four times a day (QID) | INTRAMUSCULAR | Status: DC | PRN

## 2022-07-03 MED ORDER — FENTANYL CITRATE (PF) 250 MCG/5ML IJ SOLN
INTRAMUSCULAR | Status: DC | PRN
  Administered 2022-07-03: 50 ug via INTRAVENOUS
  Administered 2022-07-03 (×5): 25 ug via INTRAVENOUS
  Administered 2022-07-03: 50 ug via INTRAVENOUS

## 2022-07-03 MED ORDER — ESMOLOL HCL 100 MG/10ML IV SOLN
INTRAVENOUS | Status: DC | PRN
  Administered 2022-07-03: 30 mg via INTRAVENOUS
  Administered 2022-07-03: 20 mg via INTRAVENOUS

## 2022-07-03 MED ORDER — MAGNESIUM SULFATE 4 GM/100ML IV SOLN
4.0000 g | Freq: Once | INTRAVENOUS | Status: AC
  Administered 2022-07-03: 4 g via INTRAVENOUS
  Filled 2022-07-03 (×2): qty 100

## 2022-07-03 MED ORDER — 0.9 % SODIUM CHLORIDE (POUR BTL) OPTIME
TOPICAL | Status: DC | PRN
  Administered 2022-07-03: 1000 mL

## 2022-07-03 MED ORDER — ONDANSETRON HCL 4 MG PO TABS: 4.0000 mg | ORAL_TABLET | Freq: Four times a day (QID) | ORAL | Status: DC | PRN

## 2022-07-03 MED ORDER — CHLORHEXIDINE GLUCONATE 0.12 % MT SOLN
OROMUCOSAL | Status: AC
  Administered 2022-07-03: 15 mL via OROMUCOSAL
  Filled 2022-07-03: qty 15

## 2022-07-03 MED ORDER — METOCLOPRAMIDE HCL 5 MG/ML IJ SOLN: 5.0000 mg | Freq: Three times a day (TID) | INTRAMUSCULAR | Status: DC | PRN

## 2022-07-03 MED ORDER — LIDOCAINE 2% (20 MG/ML) 5 ML SYRINGE
INTRAMUSCULAR | Status: AC
  Filled 2022-07-03: qty 5

## 2022-07-03 MED ORDER — METOCLOPRAMIDE HCL 5 MG PO TABS: 5.0000 mg | ORAL_TABLET | Freq: Three times a day (TID) | ORAL | Status: DC | PRN

## 2022-07-03 MED ORDER — FENTANYL CITRATE (PF) 100 MCG/2ML IJ SOLN
INTRAMUSCULAR | Status: AC
  Filled 2022-07-03: qty 2

## 2022-07-03 MED ORDER — ONDANSETRON HCL 4 MG/2ML IJ SOLN
INTRAMUSCULAR | Status: AC
  Filled 2022-07-03: qty 2

## 2022-07-03 MED ORDER — MORPHINE SULFATE (PF) 2 MG/ML IV SOLN: 2.0000 mg | Freq: Once | INTRAVENOUS | Status: DC

## 2022-07-03 MED ORDER — ROCURONIUM BROMIDE 10 MG/ML (PF) SYRINGE
PREFILLED_SYRINGE | INTRAVENOUS | Status: AC
  Filled 2022-07-03: qty 10

## 2022-07-03 MED ORDER — TRANEXAMIC ACID-NACL 1000-0.7 MG/100ML-% IV SOLN
1000.0000 mg | Freq: Once | INTRAVENOUS | Status: AC
  Administered 2022-07-03: 1000 mg via INTRAVENOUS
  Filled 2022-07-03 (×2): qty 100

## 2022-07-03 MED ORDER — MENTHOL 3 MG MT LOZG: 1.0000 | LOZENGE | OROMUCOSAL | Status: DC | PRN

## 2022-07-03 MED ORDER — OXYCODONE HCL 5 MG/5ML PO SOLN: 5.0000 mg | Freq: Once | ORAL | Status: DC | PRN

## 2022-07-03 MED ORDER — FENTANYL CITRATE (PF) 100 MCG/2ML IJ SOLN
25.0000 ug | INTRAMUSCULAR | Status: DC | PRN
  Administered 2022-07-03 (×3): 25 ug via INTRAVENOUS

## 2022-07-03 MED ORDER — ORAL CARE MOUTH RINSE: 15.0000 mL | Freq: Once | OROMUCOSAL | Status: AC

## 2022-07-03 MED ORDER — SODIUM CHLORIDE 0.9 % IV SOLN
INTRAVENOUS | Status: AC
  Filled 2022-07-03: qty 20

## 2022-07-03 MED ORDER — FENTANYL CITRATE PF 50 MCG/ML IJ SOSY
25.0000 ug | PREFILLED_SYRINGE | INTRAMUSCULAR | Status: DC | PRN
  Administered 2022-07-03 (×3): 25 ug via INTRAVENOUS
  Filled 2022-07-03 (×4): qty 1

## 2022-07-03 MED ORDER — CHLORHEXIDINE GLUCONATE 0.12 % MT SOLN: 15.0000 mL | Freq: Once | OROMUCOSAL | Status: AC

## 2022-07-03 MED ORDER — PROPOFOL 10 MG/ML IV BOLUS
INTRAVENOUS | Status: AC
  Filled 2022-07-03: qty 20

## 2022-07-03 MED ORDER — CHLORHEXIDINE GLUCONATE CLOTH 2 % EX PADS
6.0000 | MEDICATED_PAD | Freq: Every day | CUTANEOUS | Status: DC
  Administered 2022-07-04 – 2022-07-11 (×8): 6 via TOPICAL

## 2022-07-03 MED ORDER — DEXAMETHASONE SODIUM PHOSPHATE 10 MG/ML IJ SOLN
INTRAMUSCULAR | Status: AC
  Filled 2022-07-03: qty 1

## 2022-07-03 MED ORDER — SODIUM CHLORIDE 0.9 % IV SOLN: INTRAVENOUS | Status: DC

## 2022-07-03 MED ORDER — LIDOCAINE 2% (20 MG/ML) 5 ML SYRINGE
INTRAMUSCULAR | Status: DC | PRN
  Administered 2022-07-03: 60 mg via INTRAVENOUS

## 2022-07-03 MED ORDER — BUPIVACAINE HCL (PF) 0.25 % IJ SOLN
INTRAMUSCULAR | Status: AC
  Filled 2022-07-03: qty 30

## 2022-07-03 MED ORDER — DEXAMETHASONE SODIUM PHOSPHATE 10 MG/ML IJ SOLN
INTRAMUSCULAR | Status: DC | PRN
  Administered 2022-07-03: 5 mg via INTRAVENOUS

## 2022-07-03 MED ORDER — FENTANYL CITRATE PF 50 MCG/ML IJ SOSY
50.0000 ug | PREFILLED_SYRINGE | Freq: Once | INTRAMUSCULAR | Status: AC
  Administered 2022-07-03: 50 ug via INTRAVENOUS
  Filled 2022-07-03: qty 1

## 2022-07-03 SURGICAL SUPPLY — 76 items
BAG COUNTER SPONGE SURGICOUNT (BAG) ×1 IMPLANT
BAG SPNG CNTER NS LX DISP (BAG) ×1
BIT DRILL 4.3 (BIT) ×1
BIT DRILL 4.3X300MM (BIT) IMPLANT
BIT DRILL LONG 3.3 (BIT) IMPLANT
BIT DRILL QC 3.3X195 (BIT) IMPLANT
BLADE CLIPPER SURG (BLADE) IMPLANT
BNDG ELASTIC 4X5.8 VLCR STR LF (GAUZE/BANDAGES/DRESSINGS) ×1 IMPLANT
BNDG ELASTIC 6X5.8 VLCR STR LF (GAUZE/BANDAGES/DRESSINGS) ×1 IMPLANT
BNDG GAUZE DERMACEA FLUFF 4 (GAUZE/BANDAGES/DRESSINGS) ×1 IMPLANT
BNDG GZE DERMACEA 4 6PLY (GAUZE/BANDAGES/DRESSINGS) ×1
BRUSH SCRUB EZ PLAIN DRY (MISCELLANEOUS) ×2 IMPLANT
CANISTER SUCT 3000ML PPV (MISCELLANEOUS) ×2 IMPLANT
CAP LOCK NCB (Cap) IMPLANT
COVER SURGICAL LIGHT HANDLE (MISCELLANEOUS) ×1 IMPLANT
DRAPE C-ARM 42X72 X-RAY (DRAPES) ×2 IMPLANT
DRAPE C-ARMOR (DRAPES) ×2 IMPLANT
DRAPE IMP U-DRAPE 54X76 (DRAPES) ×1 IMPLANT
DRAPE ORTHO SPLIT 77X108 STRL (DRAPES) ×3
DRAPE SURG ORHT 6 SPLT 77X108 (DRAPES) ×6 IMPLANT
DRAPE U-SHAPE 47X51 STRL (DRAPES) ×1 IMPLANT
DRSG ADAPTIC 3X8 NADH LF (GAUZE/BANDAGES/DRESSINGS) ×1 IMPLANT
ELECT REM PT RETURN 9FT ADLT (ELECTROSURGICAL) ×1
ELECTRODE REM PT RTRN 9FT ADLT (ELECTROSURGICAL) ×2 IMPLANT
EVACUATOR 1/8 PVC DRAIN (DRAIN) IMPLANT
EVACUATOR 3/16  PVC DRAIN (DRAIN)
EVACUATOR 3/16 PVC DRAIN (DRAIN) IMPLANT
GAUZE PAD ABD 8X10 STRL (GAUZE/BANDAGES/DRESSINGS) ×4 IMPLANT
GAUZE SPONGE 4X4 12PLY STRL (GAUZE/BANDAGES/DRESSINGS) ×1 IMPLANT
GLOVE BIO SURGEON STRL SZ7.5 (GLOVE) ×1 IMPLANT
GLOVE BIO SURGEON STRL SZ8 (GLOVE) ×2 IMPLANT
GLOVE BIOGEL PI IND STRL 7.5 (GLOVE) ×2 IMPLANT
GLOVE BIOGEL PI IND STRL 8 (GLOVE) ×1 IMPLANT
GLOVE SURG ORTHO LTX SZ7.5 (GLOVE) ×2 IMPLANT
GOWN STRL REUS W/ TWL LRG LVL3 (GOWN DISPOSABLE) ×2 IMPLANT
GOWN STRL REUS W/ TWL XL LVL3 (GOWN DISPOSABLE) ×1 IMPLANT
GOWN STRL REUS W/TWL LRG LVL3 (GOWN DISPOSABLE) ×2
GOWN STRL REUS W/TWL XL LVL3 (GOWN DISPOSABLE) ×1
K-WIRE 2.0 (WIRE) ×5
K-WIRE FXSTD 280X2XNS SS (WIRE) ×5
KIT BASIN OR (CUSTOM PROCEDURE TRAY) ×1 IMPLANT
KIT TURNOVER KIT B (KITS) ×1 IMPLANT
KWIRE FXSTD 280X2XNS SS (WIRE) IMPLANT
NDL 22X1.5 STRL (OR ONLY) (MISCELLANEOUS) IMPLANT
NEEDLE 22X1.5 STRL (OR ONLY) (MISCELLANEOUS) IMPLANT
NS IRRIG 1000ML POUR BTL (IV SOLUTION) ×2 IMPLANT
PACK TOTAL JOINT (CUSTOM PROCEDURE TRAY) ×1 IMPLANT
PACK UNIVERSAL I (CUSTOM PROCEDURE TRAY) ×1 IMPLANT
PAD ARMBOARD 7.5X6 YLW CONV (MISCELLANEOUS) ×2 IMPLANT
PAD CAST 4YDX4 CTTN HI CHSV (CAST SUPPLIES) ×2 IMPLANT
PADDING CAST COTTON 4X4 STRL (CAST SUPPLIES) ×1
PADDING CAST COTTON 6X4 STRL (CAST SUPPLIES) ×1 IMPLANT
PLATE FEM DIST NCB PP 278MM (Plate) IMPLANT
SCREW 5.0 80MM (Screw) IMPLANT
SCREW CORTICAL NCB 5.0X40 (Screw) IMPLANT
SCREW NCB 3.5X75X5X6.2XST (Screw) IMPLANT
SCREW NCB 4.0X36MM (Screw) IMPLANT
SCREW NCB 5.0X36MM (Screw) IMPLANT
SCREW NCB 5.0X38 (Screw) IMPLANT
SCREW NCB 5.0X75MM (Screw) ×5 IMPLANT
SPONGE T-LAP 18X18 ~~LOC~~+RFID (SPONGE) ×2 IMPLANT
STAPLER VISISTAT 35W (STAPLE) ×1 IMPLANT
SUCTION FRAZIER HANDLE 10FR (MISCELLANEOUS) ×1
SUCTION TUBE FRAZIER 10FR DISP (MISCELLANEOUS) ×1 IMPLANT
SUT PROLENE 0 CT 2 (SUTURE) IMPLANT
SUT VIC AB 0 CT1 27 (SUTURE) ×2
SUT VIC AB 0 CT1 27XBRD ANBCTR (SUTURE) ×2 IMPLANT
SUT VIC AB 1 CT1 27 (SUTURE) ×2
SUT VIC AB 1 CT1 27XBRD ANBCTR (SUTURE) ×2 IMPLANT
SUT VIC AB 2-0 CT1 27 (SUTURE) ×2
SUT VIC AB 2-0 CT1 TAPERPNT 27 (SUTURE) ×2 IMPLANT
SYR 20ML ECCENTRIC (SYRINGE) IMPLANT
TOWEL GREEN STERILE (TOWEL DISPOSABLE) ×2 IMPLANT
TOWEL GREEN STERILE FF (TOWEL DISPOSABLE) ×2 IMPLANT
TRAY FOLEY MTR SLVR 16FR STAT (SET/KITS/TRAYS/PACK) IMPLANT
WATER STERILE IRR 1000ML POUR (IV SOLUTION) ×2 IMPLANT

## 2022-07-03 NOTE — Progress Notes (Signed)
Subjective:  s/p ortho repair of leg-  2300 of UOP and crt much better today -  she is uncomfortable post op   Objective Vital signs in last 24 hours: Vitals:   07/03/22 1050 07/03/22 1100 07/03/22 1115 07/03/22 1130  BP: (!) 180/83 (!) 185/70 (!) 181/65 (!) 189/81  Pulse: 98 94 100 (!) 104  Resp: (!) '25 20 17 '$ (!) 24  Temp: 98.4 F (36.9 C)   97.8 F (36.6 C)  TempSrc:      SpO2: 100% 100% 98% 96%  Weight:      Height:       Weight change:   Intake/Output Summary (Last 24 hours) at 07/03/2022 1218 Last data filed at 07/03/2022 1135 Gross per 24 hour  Intake 2039.95 ml  Output 1775 ml  Net 264.95 ml    Assessment/ Plan: Pt is a 88 y.o. yo female who was admitted on 06/30/2022 with mechanical fall with fracture-  has some A on CRF Assessment/Plan: 1. Renal-   baseline CKD with crt looks like mid to high 1's-  history of AKI in August as well.  Non oliguric and crt is trending down last 48 hours.  Initially urine with hematuria and proteinuria-  really not candidate for aggressive work up for this.  U/S just c/w chronic medical renal dz.  This A on CRF felt due to ATN-  thankfully has improved the last 48 hours.  Continue to avoid any nephrotoxins.  Would not consider her a candidate for dialysis based on age and dementia-  thankfully though kidney function is trending better  2. Femur fracture-  req surgical stabilization-  done 12/4  -  se is uncomfortable post op  3. Anemia- situational and maybe some anemia of CKD-  given 2 units PRBCs 12/3-  gave dose of ESA as well -  up to 9.6 today   4. HTN/volume-  BP high today -  has an order for IVF but not currently getting-  I think is OK to just leave off for now-  no BP meds   Louis Meckel    Labs: Basic Metabolic Panel: Recent Labs  Lab 07/01/22 0122 07/02/22 0357 07/03/22 0225  NA 136 138 136  K 4.9 4.5 3.9  CL 107 107 107  CO2 18* 20* 20*  GLUCOSE 117* 132* 160*  BUN 49* 51* 40*  CREATININE 4.69* 3.76* 2.52*   CALCIUM 8.1* 8.6* 8.6*   Liver Function Tests: Recent Labs  Lab 06/30/22 1906 07/02/22 0357  AST 29 26  ALT 18 16  ALKPHOS 147* 148*  BILITOT 0.4 0.9  PROT 6.1* 6.1*  ALBUMIN 2.5* 2.4*   No results for input(s): "LIPASE", "AMYLASE" in the last 168 hours. No results for input(s): "AMMONIA" in the last 168 hours. CBC: Recent Labs  Lab 06/30/22 1906 06/30/22 2116 07/01/22 0122 07/01/22 0706 07/01/22 1204 07/02/22 0357 07/03/22 0225  WBC 10.3   < > 9.8 9.0 8.1 9.3 7.4  NEUTROABS 8.3*  --   --   --   --   --   --   HGB 8.3*   < > 7.2* 7.5* 6.9* 9.0* 9.6*  HCT 25.4*   < > 23.1* 23.5* 22.2* 25.4* 28.9*  MCV 90.7   < > 92.8 92.5 93.7 84.9 87.6  PLT 204   < > 210 196 188 198 206   < > = values in this interval not displayed.   Cardiac Enzymes: Recent Labs  Lab 07/02/22 0357  CKTOTAL  188   CBG: Recent Labs  Lab 07/02/22 1129 07/02/22 2032 07/03/22 0737 07/03/22 1052 07/03/22 1203  GLUCAP 163* 148* 137* 147* 179*    Iron Studies:  Recent Labs    06/30/22 2116  IRON 20*  TIBC 216*  FERRITIN 111   Studies/Results: DG FEMUR, MIN 2 VIEWS RIGHT  Result Date: 07/03/2022 CLINICAL DATA:  ORIF right femur. Elective surgery. Intraoperative fluoroscopy. EXAM: RIGHT FEMUR 2 VIEWS COMPARISON:  Right knee radiographs 06/30/2022, right hip radiographs 06/30/2022 FINDINGS: Images were performed intraoperatively without the presence of a radiologist. Interval lateral plate and screw fixation of the distal fibula, fixating the previously seen comminuted and displaced distal femoral diaphyseal metaphyseal fracture. Improved alignment. Redemonstration of total right knee arthroplasty hardware. Total fluoroscopy images: 6 Total fluoroscopy time: 48 seconds Total dose: Radiation Exposure Index (as provided by the fluoroscopic device): 5.51 mGy air Kerma Please see intraoperative findings for further detail. IMPRESSION: Intraoperative fluoroscopic guidance for ORIF of the distal right  femur. Electronically Signed   By: Yvonne Kendall M.D.   On: 07/03/2022 11:26   DG C-Arm 1-60 Min-No Report  Result Date: 07/03/2022 Fluoroscopy was utilized by the requesting physician.  No radiographic interpretation.   DG Wrist Complete Right  Result Date: 07/02/2022 CLINICAL DATA:  Forearm and wrist pain. EXAM: RIGHT WRIST - COMPLETE 3+ VIEW; RIGHT FOREARM - 2 VIEW COMPARISON:  None Available. FINDINGS: No acute fracture or dislocation. No aggressive osseous lesion. Normal alignment. Generalized osteopenia. Mild osteoarthritis of the first [facility] joint and first MCP joint. Moderate osteoarthritis of the first IP joint. Soft tissue are unremarkable. No radiopaque foreign body or soft tissue emphysema. Peripheral vascular atherosclerotic disease. IMPRESSION: 1. No acute osseous injury of the right wrist and forearm. 2. Mild osteoarthritis of the first [place] joint and first MCP joint. Electronically Signed   By: Kathreen Devoid M.D.   On: 07/02/2022 11:41   DG Forearm Right  Result Date: 07/02/2022 CLINICAL DATA:  Forearm and wrist pain. EXAM: RIGHT WRIST - COMPLETE 3+ VIEW; RIGHT FOREARM - 2 VIEW COMPARISON:  None Available. FINDINGS: No acute fracture or dislocation. No aggressive osseous lesion. Normal alignment. Generalized osteopenia. Mild osteoarthritis of the first [facility] joint and first MCP joint. Moderate osteoarthritis of the first IP joint. Soft tissue are unremarkable. No radiopaque foreign body or soft tissue emphysema. Peripheral vascular atherosclerotic disease. IMPRESSION: 1. No acute osseous injury of the right wrist and forearm. 2. Mild osteoarthritis of the first [place] joint and first MCP joint. Electronically Signed   By: Kathreen Devoid M.D.   On: 07/02/2022 11:41   DG Chest 1 View  Result Date: 07/02/2022 CLINICAL DATA:  [id number] Repeat prescription issue [id number] EXAM: CHEST  1 VIEW COMPARISON:  06/30/2022 chest radiograph. FINDINGS: Surgical clips again noted in the left axilla. Stable  cardiomediastinal silhouette with mild cardiomegaly. No pneumothorax. No pleural effusion. No overt pulmonary edema. Mild streaky bibasilar scarring versus atelectasis, unchanged. IMPRESSION: 1. Stable mild cardiomegaly without overt pulmonary edema. 2. Stable mild streaky bibasilar scarring versus atelectasis. Electronically Signed   By: Ilona Sorrel M.D.   On: 07/02/2022 09:26   US RENAL  Result Date: 07/01/2022 CLINICAL DATA:  Acute on chronic renal failure with stage IV CKD. EXAM: RENAL / URINARY TRACT ULTRASOUND COMPLETE COMPARISON:  Renal ultrasound 03/11/2022. FINDINGS: Right Kidney: Renal measurements: 10.3 x 4.1 x 4.5 cm = volume: 99.4 mL, previously 93.9 mL but probably unchanged accounting for technical factors. There is mild-to-moderate increased cortical echogenicity, mild cortical volume  loss. No mass, stones or hydronephrosis visualized. There is a 1.2 cm simple cyst in the outer upper pole, and a 1.2 cm simple parapelvic cyst at the midpole. No new abnormality. Left Kidney: Renal measurements: 12.0 x 7.2 x 5.2 cm = volume: 232.6 mL, but probably overestimated as the AP axis was measured from the hilum to the outer aspect of a nearly 5 cm cyst. Echogenicity is increased. 4.8 cm mid/lower pole simple cyst is again shown. No mass, stones or hydronephrosis visualized. Bladder: Catheterized, contracted and obscured by bowel gas. Other: None. IMPRESSION: 1. No acute findings. No hydronephrosis. 2. Findings of chronic medical renal disease. 3. Bilateral renal cysts. 4. Accounting for technical factors there is no appreciable interval change from 03/11/2022. Electronically Signed   By: Telford Nab M.D.   On: 07/01/2022 23:59   CT HEAD WO CONTRAST (5MM)  Result Date: 07/01/2022 CLINICAL DATA:  Head trauma EXAM: CT HEAD WITHOUT CONTRAST TECHNIQUE: Contiguous axial images were obtained from the base of the skull through the vertex without intravenous contrast. RADIATION DOSE REDUCTION: This exam was  performed according to the departmental dose-optimization program which includes automated exposure control, adjustment of the mA and/or kV according to patient size and/or use of iterative reconstruction technique. COMPARISON:  None Available. FINDINGS: Brain: There is no mass, hemorrhage or extra-axial collection. The size and configuration of the ventricles and extra-axial CSF spaces are normal. There is hypoattenuation of the white matter, most commonly indicating chronic small vessel disease. Vascular: No abnormal hyperdensity of the major intracranial arteries or dural venous sinuses. No intracranial atherosclerosis. Skull: The visualized skull base, calvarium and extracranial soft tissues are normal. Sinuses/Orbits: No fluid levels or advanced mucosal thickening of the visualized paranasal sinuses. No mastoid or middle ear effusion. The orbits are normal. IMPRESSION: 1. No acute intracranial abnormality. 2. Chronic small vessel disease. Electronically Signed   By: Ulyses Jarred M.D.   On: 07/01/2022 21:43   Medications: Infusions:  sodium chloride 85 mL/hr at 07/03/22 0629   [START ON 07/04/2022] cefTRIAXone (ROCEPHIN)  IV     magnesium sulfate bolus IVPB     methocarbamol (ROBAXIN) IV     tranexamic acid      Scheduled Medications:  sodium chloride   Intravenous Once   acetaminophen  650 mg Oral Q6H   allopurinol  100 mg Oral BID   Chlorhexidine Gluconate Cloth  6 each Topical Daily   docusate sodium  100 mg Oral BID   feeding supplement (NEPRO CARB STEADY)  237 mL Oral BID BM   fentaNYL       heparin injection (subcutaneous)  5,000 Units Subcutaneous Q8H   insulin aspart  0-9 Units Subcutaneous TID WC   levETIRAcetam  500 mg Oral q AM   mirtazapine  15 mg Oral QHS   multivitamin  1 tablet Oral QHS   pantoprazole (PROTONIX) IV  40 mg Intravenous Q24H   QUEtiapine  25 mg Oral QHS    have reviewed scheduled and prn medications.  Physical Exam: General:  NAD- seems uncomfortable  after procedure  Heart:slightly tachy Lungs: clear Abdomen: soft, non tender Extremities: no edema     07/03/2022,12:18 PM  LOS: 3 days

## 2022-07-03 NOTE — Progress Notes (Addendum)
PROGRESS NOTE    [name]  IZT:[id number] DOB: 15-Jul-1929 DOA: 06/30/2022 PCP: Seward Carol, MD   Brief Narrative:  88 year old female with medical history of hypertension, diabetes mellitus type 2, stage IV CKD, dementia, diastolic CHF, GERD who was on vacation with her daughter in [place] when she suffered a mechanical fall.  Patient went to balcony when she missed a step and fell down.  Her daughter found her on her back with right leg shortened and on outwards.  She was transferred to hospital.  Also surgeons at [place] told her that was a complex fracture and did not think it was safe to travel home.  Her daughter medi vaced her out today and travel time was 3 hours and 6 minutes. There is no paper chart.  During hospitalization she was found to have right distal femur periprosthetic fracture, acute anemia and off-and-on agitation/hallucination. Underwent OR today, Renal func is improving.    Assessment & Plan:  Principal Problem:   Closed fracture of right distal femur ([place]) Active Problems:   Acute respiratory failure with hypoxia (HCC)   Acute renal failure superimposed on stage 4 chronic kidney disease (HCC)   Acute on chronic anemia   Chronic diastolic CHF (congestive heart failure) (HCC)   Type 2 diabetes mellitus with complication, without long-term current use of insulin (HCC)   Hypertension   GERD (gastroesophageal reflux disease)   history of seizures   Dementia (HCC)       Right distal femur periprosthetic fracture Mechanical fall -Sustained fall in [place].  For now conservative management, pain control, supportive care.  Plans for OR today.  Vitamin D levels are normal. -CT head negative   Acute hypoxemic respiratory failure - Elevated D-dimer, likely from atelectasis.  Low suspicion for PE.  Aggressive use of incentive spirometer and flutter valve.  Procalcitonin is negative, BNP 309   Iron deficiency anemia/acute on chronic anemia -Baseline  hemoglobin 9, admission hemoglobin less than 7.  Status post transfusion, hemoglobin today 9.0.  Hemoglobin 9.6 -Low iron saturation, eventually iron supplements   Acute kidney injury on CKD stage III -Patient's baseline creatinine 1.47 as of 03/15/2022.  Admission creatinine 4.45, creatinine continues to improve with IV fluids.  This morning 1.5  Bacteriuria, urinary urgency Positive dipstick - CK level is normal.  Off-and-on hallucination.  Empirically on IV Rocephin, micro is growing gram-negative rods     History of seizures -Has been seizure-free for many years.  Continue Keppra.  This can be changed to IV if NPO   Dementia without behavior disturbance -Continue Remeron, Seroquel nightly   Diabetes mellitus type 2 -Hemoglobin A1c 6.4 -Sliding scale and Accu-Cheks  Hypomagnesemia - As needed repletion    DVT prophylaxis: Subcu heparin Code Status: Full code Family Communication:  Status is: Inpatient Remains inpatient appropriate because: Plans for or today.  Nutritional status    Signs/Symptoms: estimated needs  Interventions: Nepro shake, MVI  Body mass index is 25.08 kg/m.         Subjective: Seen right after OR.  Overall appeared comfortable and was tachy.  No other complaints.  Examination: Constitutional: Mild distress secondary to pain Respiratory: Some bibasilar crackles Cardiovascular: Sinus tachycardia-HR 106 Abdomen: Nontender nondistended good bowel sounds Musculoskeletal: No edema noted Skin: No rashes seen Neurologic: CN 2-12 grossly intact.  And nonfocal Psychiatric: Normal judgment and insight. Alert and oriented x 3. Normal mood. Incision site looks okay  Objective: Vitals:   07/02/22 2112 07/02/22 2115 07/03/22 0550  07/03/22 0747  BP: (!) 158/78  (!) 170/69 (!) 179/69  Pulse:    95  Resp: '19  19 18  '$ Temp: 98 F (36.7 C)  98.2 F (36.8 C)   TempSrc: Oral  Oral   SpO2: 94% 94% 98% 93%  Weight:      Height:         Intake/Output Summary (Last 24 hours) at 07/03/2022 0919 Last data filed at 07/03/2022 4481 Gross per 24 hour  Intake 1689.95 ml  Output 2400 ml  Net -710.05 ml   Filed Weights   06/30/22 1700 07/01/22 2215 07/02/22 0440  Weight: 59.1 kg 60.2 kg 60.2 kg     Data Reviewed:   CBC: Recent Labs  Lab 06/30/22 1906 06/30/22 2116 07/01/22 0122 07/01/22 0706 07/01/22 1204 07/02/22 0357 07/03/22 0225  WBC 10.3   < > 9.8 9.0 8.1 9.3 7.4  NEUTROABS 8.3*  --   --   --   --   --   --   HGB 8.3*   < > 7.2* 7.5* 6.9* 9.0* 9.6*  HCT 25.4*   < > 23.1* 23.5* 22.2* 25.4* 28.9*  MCV 90.7   < > 92.8 92.5 93.7 84.9 87.6  PLT 204   < > 210 196 188 198 206   < > = values in this interval not displayed.   Basic Metabolic Panel: Recent Labs  Lab 06/30/22 1906 07/01/22 0122 07/02/22 0357 07/03/22 0225  NA 137 136 138 136  K 4.6 4.9 4.5 3.9  CL 106 107 107 107  CO2 21* 18* 20* 20*  GLUCOSE 169* 117* 132* 160*  BUN 46* 49* 51* 40*  CREATININE 4.45* 4.69* 3.76* 2.52*  CALCIUM 8.3* 8.1* 8.6* 8.6*  MG  --   --   --  1.5*   GFR: Estimated Creatinine Clearance: 12.1 mL/min (A) (by C-G formula based on SCr of 2.52 mg/dL (H)). Liver Function Tests: Recent Labs  Lab 06/30/22 1906 07/02/22 0357  AST 29 26  ALT 18 16  ALKPHOS 147* 148*  BILITOT 0.4 0.9  PROT 6.1* 6.1*  ALBUMIN 2.5* 2.4*   No results for input(s): "LIPASE", "AMYLASE" in the last 168 hours. No results for input(s): "AMMONIA" in the last 168 hours. Coagulation Profile: Recent Labs  Lab 06/30/22 1906  INR 1.3*   Cardiac Enzymes: Recent Labs  Lab 07/02/22 0357  CKTOTAL 188   BNP (last 3 results) No results for input(s): "PROBNP" in the last 8760 hours. HbA1C: No results for input(s): "HGBA1C" in the last 72 hours. CBG: Recent Labs  Lab 07/01/22 1952 07/02/22 0822 07/02/22 1129 07/02/22 2032 07/03/22 0737  GLUCAP 134* 140* 163* 148* 137*   Lipid Profile: No results for input(s): "CHOL", "HDL",  "LDLCALC", "TRIG", "CHOLHDL", "LDLDIRECT" in the last 72 hours. Thyroid Function Tests: No results for input(s): "TSH", "T4TOTAL", "FREET4", "T3FREE", "THYROIDAB" in the last 72 hours. Anemia Panel: Recent Labs    06/30/22 2116  FERRITIN 111  TIBC 216*  IRON 20*   Sepsis Labs: Recent Labs  Lab 07/03/22 0225  PROCALCITON <0.10    Recent Results (from the past 240 hour(s))  Urine Culture     Status: Abnormal (Preliminary result)   Collection Time: 07/02/22 10:15 AM   Specimen: Urine, Clean Catch  Result Value Ref Range Status   Specimen Description URINE, CLEAN CATCH  Final   Special Requests NONE  Final   Culture (A)  Final    >=100,000 COLONIES/mL GRAM NEGATIVE RODS CULTURE REINCUBATED FOR  BETTER GROWTH SUSCEPTIBILITIES TO FOLLOW Performed at [facility], [place] [address], [place], [place] [address]    Report Status PENDING  Incomplete         Radiology Studies: DG Wrist Complete Right  Result Date: 07/02/2022 CLINICAL DATA:  Forearm and wrist pain. EXAM: RIGHT WRIST - COMPLETE 3+ VIEW; RIGHT FOREARM - 2 VIEW COMPARISON:  None Available. FINDINGS: No acute fracture or dislocation. No aggressive osseous lesion. Normal alignment. Generalized osteopenia. Mild osteoarthritis of the first [facility] joint and first MCP joint. Moderate osteoarthritis of the first IP joint. Soft tissue are unremarkable. No radiopaque foreign body or soft tissue emphysema. Peripheral vascular atherosclerotic disease. IMPRESSION: 1. No acute osseous injury of the right wrist and forearm. 2. Mild osteoarthritis of the first [place] joint and first MCP joint. Electronically Signed   By: Kathreen Devoid M.D.   On: 07/02/2022 11:41   DG Forearm Right  Result Date: 07/02/2022 CLINICAL DATA:  Forearm and wrist pain. EXAM: RIGHT WRIST - COMPLETE 3+ VIEW; RIGHT FOREARM - 2 VIEW COMPARISON:  None Available. FINDINGS: No acute fracture or dislocation. No aggressive osseous lesion. Normal alignment. Generalized  osteopenia. Mild osteoarthritis of the first [facility] joint and first MCP joint. Moderate osteoarthritis of the first IP joint. Soft tissue are unremarkable. No radiopaque foreign body or soft tissue emphysema. Peripheral vascular atherosclerotic disease. IMPRESSION: 1. No acute osseous injury of the right wrist and forearm. 2. Mild osteoarthritis of the first [place] joint and first MCP joint. Electronically Signed   By: Kathreen Devoid M.D.   On: 07/02/2022 11:41   DG Chest 1 View  Result Date: 07/02/2022 CLINICAL DATA:  [id number] Repeat prescription issue [id number] EXAM: CHEST  1 VIEW COMPARISON:  06/30/2022 chest radiograph. FINDINGS: Surgical clips again noted in the left axilla. Stable cardiomediastinal silhouette with mild cardiomegaly. No pneumothorax. No pleural effusion. No overt pulmonary edema. Mild streaky bibasilar scarring versus atelectasis, unchanged. IMPRESSION: 1. Stable mild cardiomegaly without overt pulmonary edema. 2. Stable mild streaky bibasilar scarring versus atelectasis. Electronically Signed   By: Ilona Sorrel M.D.   On: 07/02/2022 09:26   US RENAL  Result Date: 07/01/2022 CLINICAL DATA:  Acute on chronic renal failure with stage IV CKD. EXAM: RENAL / URINARY TRACT ULTRASOUND COMPLETE COMPARISON:  Renal ultrasound 03/11/2022. FINDINGS: Right Kidney: Renal measurements: 10.3 x 4.1 x 4.5 cm = volume: 99.4 mL, previously 93.9 mL but probably unchanged accounting for technical factors. There is mild-to-moderate increased cortical echogenicity, mild cortical volume loss. No mass, stones or hydronephrosis visualized. There is a 1.2 cm simple cyst in the outer upper pole, and a 1.2 cm simple parapelvic cyst at the midpole. No new abnormality. Left Kidney: Renal measurements: 12.0 x 7.2 x 5.2 cm = volume: 232.6 mL, but probably overestimated as the AP axis was measured from the hilum to the outer aspect of a nearly 5 cm cyst. Echogenicity is increased. 4.8 cm mid/lower pole simple cyst is again shown. No  mass, stones or hydronephrosis visualized. Bladder: Catheterized, contracted and obscured by bowel gas. Other: None. IMPRESSION: 1. No acute findings. No hydronephrosis. 2. Findings of chronic medical renal disease. 3. Bilateral renal cysts. 4. Accounting for technical factors there is no appreciable interval change from 03/11/2022. Electronically Signed   By: Telford Nab M.D.   On: 07/01/2022 23:59   CT HEAD WO CONTRAST (5MM)  Result Date: 07/01/2022 CLINICAL DATA:  Head trauma EXAM: CT HEAD WITHOUT CONTRAST TECHNIQUE: Contiguous axial images were obtained from the base  of the skull through the vertex without intravenous contrast. RADIATION DOSE REDUCTION: This exam was performed according to the departmental dose-optimization program which includes automated exposure control, adjustment of the mA and/or kV according to patient size and/or use of iterative reconstruction technique. COMPARISON:  None Available. FINDINGS: Brain: There is no mass, hemorrhage or extra-axial collection. The size and configuration of the ventricles and extra-axial CSF spaces are normal. There is hypoattenuation of the white matter, most commonly indicating chronic small vessel disease. Vascular: No abnormal hyperdensity of the major intracranial arteries or dural venous sinuses. No intracranial atherosclerosis. Skull: The visualized skull base, calvarium and extracranial soft tissues are normal. Sinuses/Orbits: No fluid levels or advanced mucosal thickening of the visualized paranasal sinuses. No mastoid or middle ear effusion. The orbits are normal. IMPRESSION: 1. No acute intracranial abnormality. 2. Chronic small vessel disease. Electronically Signed   By: Ulyses Jarred M.D.   On: 07/01/2022 21:43        Scheduled Meds:  [QDI Hold] sodium chloride   Intravenous Once   [MAR Hold] acetaminophen  650 mg Oral Q6H   [MAR Hold] allopurinol  100 mg Oral BID   [MAR Hold] Chlorhexidine Gluconate Cloth  6 each Topical Daily    [MAR Hold] feeding supplement (NEPRO CARB STEADY)  237 mL Oral BID BM   [MAR Hold] insulin aspart  0-9 Units Subcutaneous TID WC   [MAR Hold] levETIRAcetam  500 mg Oral q AM   [MAR Hold] mirtazapine  15 mg Oral QHS   [MAR Hold] multivitamin  1 tablet Oral QHS   [MAR Hold] pantoprazole (PROTONIX) IV  40 mg Intravenous Q24H   [MAR Hold] QUEtiapine  25 mg Oral QHS   Continuous Infusions:  sodium chloride 85 mL/hr at 07/03/22 0629   sodium chloride 10 mL/hr at 07/03/22 0752   [MAR Hold] cefTRIAXone (ROCEPHIN)  IV     magnesium sulfate bolus IVPB     [MAR Hold] methocarbamol (ROBAXIN) IV       LOS: 3 days   Time spent= 35 mins    Edvin Albus Arsenio Loader, MD [facility]  If 7PM-7AM, please contact night-coverage  07/03/2022, 9:19 AM

## 2022-07-03 NOTE — Progress Notes (Signed)
No changes overnight.  The risks and benefits of right femur repair were discussed with the patient, including the possibility of infection, nerve injury, vessel injury, wound breakdown, arthritis, symptomatic hardware, DVT/ PE, loss of motion, malunion, nonunion, and need for further surgery among others.  These risks were acknowledged and consent provided to proceed.  Laura Poulsbo, MD [facility], [facility] [phone]

## 2022-07-03 NOTE — Progress Notes (Signed)
$'5mg'N$  of oxycodone was given per daughter's request. Pt scored her pain above 10  Pt's  daughter [name] previously skeptical about administering oxycodone d/t pt's cognitive impairments and CKD.  Pt tolerated oxycodone well, no pain at the moment.

## 2022-07-03 NOTE — Progress Notes (Signed)
No report received from 5N nurse prior to pt coming down for surgery.

## 2022-07-03 NOTE — Plan of Care (Signed)
  Problem: Clinical Measurements: Goal: Diagnostic test results will improve Outcome: Progressing Goal: Respiratory complications will improve Outcome: Progressing   Problem: Coping: Goal: Level of anxiety will decrease Outcome: Progressing   Problem: Elimination: Goal: Will not experience complications related to bowel motility Outcome: Progressing Goal: Will not experience complications related to urinary retention Outcome: Progressing   Problem: Skin Integrity: Goal: Risk for impaired skin integrity will decrease Outcome: Progressing

## 2022-07-03 NOTE — Anesthesia Procedure Notes (Signed)
Procedure Name: Intubation Date/Time: 07/03/2022 8:44 AM  Performed by: Michele Rockers, CRNAPre-anesthesia Checklist: Patient identified, Patient being monitored, Timeout performed, Emergency Drugs available and Suction available Patient Re-evaluated:Patient Re-evaluated prior to induction Oxygen Delivery Method: Circle System Utilized Preoxygenation: Pre-oxygenation with 100% oxygen Induction Type: IV induction Ventilation: Mask ventilation without difficulty Laryngoscope Size: Miller and 2 Grade View: Grade I Tube type: Oral Tube size: 7.0 mm Number of attempts: 1 Airway Equipment and Method: Stylet Placement Confirmation: ETT inserted through vocal cords under direct vision, positive ETCO2 and breath sounds checked- equal and bilateral Secured at: 21 cm Tube secured with: Tape Dental Injury: Teeth and Oropharynx as per pre-operative assessment

## 2022-07-03 NOTE — Transfer of Care (Signed)
Immediate Anesthesia Transfer of Care Note  Patient: [name]  Procedure(s) Performed: OPEN REDUCTION INTERNAL FIXATION (ORIF) DISTAL FEMUR FRACTURE (Right: Leg Upper)  Patient Location: PACU  Anesthesia Type:General  Level of Consciousness: awake, alert , patient cooperative, and responds to stimulation  Airway & Oxygen Therapy: Patient Spontanous Breathing and Patient connected to face mask oxygen  Post-op Assessment: Report given to RN, Post -op Vital signs reviewed and stable, and Patient moving all extremities X 4  Post vital signs: Reviewed and stable  Last Vitals:  Vitals Value Taken Time  BP 187/71 07/03/22 1051  Temp    Pulse 87 07/03/22 1056  Resp 11 07/03/22 1056  SpO2 100 % 07/03/22 1056  Vitals shown include unvalidated device data.  Last Pain:  Vitals:   07/03/22 0550  TempSrc: Oral  PainSc:          Complications: No notable events documented.

## 2022-07-03 NOTE — Anesthesia Postprocedure Evaluation (Signed)
Anesthesia Post Note  Patient: [name]  Procedure(s) Performed: OPEN REDUCTION INTERNAL FIXATION (ORIF) DISTAL FEMUR FRACTURE (Right: Leg Upper)     Patient location during evaluation: PACU Anesthesia Type: General Level of consciousness: awake and alert Pain management: pain level controlled Vital Signs Assessment: post-procedure vital signs reviewed and stable Respiratory status: spontaneous breathing, nonlabored ventilation and respiratory function stable Cardiovascular status: blood pressure returned to baseline Postop Assessment: no apparent nausea or vomiting Anesthetic complications: no   No notable events documented.  Last Vitals:  Vitals:   07/03/22 1130 07/03/22 1207  BP: (!) 189/81 (!) 179/63  Pulse: (!) 104 (!) 106  Resp: (!) 24 18  Temp: 36.6 C 36.5 C  SpO2: 96% 96%    Last Pain:  Vitals:   07/03/22 1207  TempSrc: Oral  PainSc:                  Marthenia Rolling

## 2022-07-04 ENCOUNTER — Encounter (HOSPITAL_COMMUNITY): Payer: Self-pay | Admitting: Orthopedic Surgery

## 2022-07-04 DIAGNOSIS — S72491A Other fracture of lower end of right femur, initial encounter for closed fracture: Secondary | ICD-10-CM | POA: Diagnosis not present

## 2022-07-04 LAB — BASIC METABOLIC PANEL
Anion gap: 13 (ref 5–15)
BUN: 30 mg/dL — ABNORMAL HIGH (ref 8–23)
CO2: 24 mmol/L (ref 22–32)
Calcium: 9.9 mg/dL (ref 8.9–10.3)
Chloride: 102 mmol/L (ref 98–111)
Creatinine, Ser: 1.79 mg/dL — ABNORMAL HIGH (ref 0.44–1.00)
GFR, Estimated: 26 mL/min — ABNORMAL LOW (ref >60)
Glucose, Bld: 171 mg/dL — ABNORMAL HIGH (ref 70–99)
Potassium: 4.3 mmol/L (ref 3.5–5.1)
Sodium: 139 mmol/L (ref 135–145)

## 2022-07-04 LAB — GLUCOSE, CAPILLARY
Glucose-Capillary: 158 mg/dL — ABNORMAL HIGH (ref 70–99)
Glucose-Capillary: 139 mg/dL — ABNORMAL HIGH (ref 70–99)
Glucose-Capillary: 173 mg/dL — ABNORMAL HIGH (ref 70–99)
Glucose-Capillary: 164 mg/dL — ABNORMAL HIGH (ref 70–99)

## 2022-07-04 LAB — CBC
HCT: 30.2 % — ABNORMAL LOW (ref 36.0–46.0)
Hemoglobin: 10.3 g/dL — ABNORMAL LOW (ref 12.0–15.0)
MCH: 29.7 pg (ref 26.0–34.0)
MCHC: 34.1 g/dL (ref 30.0–36.0)
MCV: 87 fL (ref 80.0–100.0)
Platelets: 214 10*3/uL (ref 150–400)
RBC: 3.47 MIL/uL — ABNORMAL LOW (ref 3.87–5.11)
RDW: 16.4 % — ABNORMAL HIGH (ref 11.5–15.5)
WBC: 9.1 10*3/uL (ref 4.0–10.5)
nRBC: 0 % (ref 0.0–0.2)

## 2022-07-04 LAB — URINE CULTURE: Culture: >=100000 — AB

## 2022-07-04 LAB — MAGNESIUM: Magnesium: 2.6 mg/dL — ABNORMAL HIGH (ref 1.7–2.4)

## 2022-07-04 MED ORDER — AMLODIPINE BESYLATE 10 MG PO TABS
10.0000 mg | ORAL_TABLET | Freq: Every day | ORAL | Status: DC
  Administered 2022-07-04 – 2022-07-11 (×8): 10 mg via ORAL
  Filled 2022-07-04 (×8): qty 1

## 2022-07-04 MED ORDER — FENTANYL CITRATE PF 50 MCG/ML IJ SOSY: 12.5000 ug | PREFILLED_SYRINGE | INTRAMUSCULAR | Status: DC | PRN

## 2022-07-04 MED ORDER — HYDRALAZINE HCL 25 MG PO TABS
25.0000 mg | ORAL_TABLET | Freq: Three times a day (TID) | ORAL | Status: DC
  Administered 2022-07-04 – 2022-07-11 (×22): 25 mg via ORAL
  Filled 2022-07-04 (×22): qty 1

## 2022-07-04 MED ORDER — METOPROLOL TARTRATE 50 MG PO TABS
100.0000 mg | ORAL_TABLET | Freq: Two times a day (BID) | ORAL | Status: DC
  Administered 2022-07-04 – 2022-07-11 (×15): 100 mg via ORAL
  Filled 2022-07-04 (×15): qty 2

## 2022-07-04 MED ORDER — ORAL CARE MOUTH RINSE: 15.0000 mL | OROMUCOSAL | Status: DC | PRN

## 2022-07-04 MED ORDER — ACETAMINOPHEN 500 MG PO TABS
1000.0000 mg | ORAL_TABLET | Freq: Four times a day (QID) | ORAL | Status: DC
  Administered 2022-07-04 – 2022-07-05 (×3): 1000 mg via ORAL
  Filled 2022-07-04 (×4): qty 2

## 2022-07-04 MED ORDER — FENTANYL CITRATE PF 50 MCG/ML IJ SOSY
12.5000 ug | PREFILLED_SYRINGE | Freq: Three times a day (TID) | INTRAMUSCULAR | Status: DC | PRN
  Administered 2022-07-07: 12.5 ug via INTRAVENOUS
  Filled 2022-07-04: qty 1

## 2022-07-04 MED ORDER — TRAMADOL HCL 50 MG PO TABS
50.0000 mg | ORAL_TABLET | Freq: Two times a day (BID) | ORAL | Status: DC
  Administered 2022-07-04 – 2022-07-05 (×2): 50 mg via ORAL
  Filled 2022-07-04 (×2): qty 1

## 2022-07-04 MED ORDER — LABETALOL HCL 5 MG/ML IV SOLN
10.0000 mg | INTRAVENOUS | Status: DC | PRN
  Filled 2022-07-04: qty 4

## 2022-07-04 NOTE — Progress Notes (Signed)
Subjective:   1500 of UOP and crt again better-  she is more comfortable today   Objective Vital signs in last 24 hours: Vitals:   07/03/22 1300 07/03/22 1420 07/03/22 2012 07/04/22 0013  BP: (!) 169/65 (!) 174/70 (!) 157/73 (!) 172/73  Pulse: (!) 107 (!) 107  (!) 103  Resp:  '15 18 16  '$ Temp:  98.4 F (36.9 C) 98.3 F (36.8 C) 98 F (36.7 C)  TempSrc:  Axillary Oral Oral  SpO2: 98% 98% 97% 99%  Weight:      Height:       Weight change:   Intake/Output Summary (Last 24 hours) at 07/04/2022 1020 Last data filed at 07/04/2022 9833 Gross per 24 hour  Intake 1928.55 ml  Output 1375 ml  Net 553.55 ml    Assessment/ Plan: Pt is a 88 y.o. yo female who was admitted on 06/30/2022 with mechanical fall with fracture-  has some A on CRF Assessment/Plan: 1. Renal-   baseline CKD with crt looks like mid to high 1's-  history of AKI in August as well.  Non oliguric and crt is trending down last 72 hours.  Initially urine with hematuria and proteinuria-  really not candidate for aggressive work up for this.  U/S just c/w chronic medical renal dz.  This A on CRF felt due to ATN-  thankfully has improved now really back at baseline.  Would not consider her a candidate for dialysis based on age and dementia-  thankfully though kidney function is trending better  2. Femur fracture-  req surgical stabilization-  done 12/4  -  pain control  3. Anemia- situational and maybe some anemia of CKD-  given 2 units PRBCs 12/3-  gave dose of ESA as well -  up to 10.3 today   4. HTN/volume-  BP high today -   amlodipine and low dose hydral added back appropriately -  will stop IVF- not dry  Renal will sign off-  does not need renal specific follow up at discharge-  call with questions   Louis Meckel    Labs: Basic Metabolic Panel: Recent Labs  Lab 07/02/22 0357 07/03/22 0225 07/04/22 0324  NA 138 136 139  K 4.5 3.9 4.3  CL 107 107 102  CO2 20* 20* 24  GLUCOSE 132* 160* 171*  BUN 51* 40*  30*  CREATININE 3.76* 2.52* 1.79*  CALCIUM 8.6* 8.6* 9.9   Liver Function Tests: Recent Labs  Lab 06/30/22 1906 07/02/22 0357  AST 29 26  ALT 18 16  ALKPHOS 147* 148*  BILITOT 0.4 0.9  PROT 6.1* 6.1*  ALBUMIN 2.5* 2.4*   No results for input(s): "LIPASE", "AMYLASE" in the last 168 hours. No results for input(s): "AMMONIA" in the last 168 hours. CBC: Recent Labs  Lab 06/30/22 1906 06/30/22 2116 07/01/22 0706 07/01/22 1204 07/02/22 0357 07/03/22 0225 07/04/22 0324  WBC 10.3   < > 9.0 8.1 9.3 7.4 9.1  NEUTROABS 8.3*  --   --   --   --   --   --   HGB 8.3*   < > 7.5* 6.9* 9.0* 9.6* 10.3*  HCT 25.4*   < > 23.5* 22.2* 25.4* 28.9* 30.2*  MCV 90.7   < > 92.5 93.7 84.9 87.6 87.0  PLT 204   < > 196 188 198 206 214   < > = values in this interval not displayed.   Cardiac Enzymes: Recent Labs  Lab 07/02/22 0357  CKTOTAL 188  CBG: Recent Labs  Lab 07/03/22 1052 07/03/22 1203 07/03/22 1613 07/03/22 2013 07/04/22 0759  GLUCAP 147* 179* 194* 202* 158*    Iron Studies:  No results for input(s): "IRON", "TIBC", "TRANSFERRIN", "FERRITIN" in the last 72 hours.  Studies/Results: DG Knee Right Port  Result Date: 07/03/2022 CLINICAL DATA:  Closed fracture right distal femur status post ORIF. EXAM: PORTABLE RIGHT KNEE - 1-2 VIEW COMPARISON:  Right knee radiographs 06/30/2022 FINDINGS: Interval lateral plate and screw fixation of the previously seen markedly comminuted distal fibular diaphyseal and metaphyseal fracture. There is again mild diastasis of multiple distal fibular fracture fragments, however the fixation hardware screws are distal and proximal to this, and no hardware failure is seen. Redemonstration of total right knee arthroplasty hardware. Expected postoperative air within the lateral thigh and knee joint. IMPRESSION: Interval lateral plate and screw fixation of the previously seen markedly comminuted distal fibular diaphyseal and metaphyseal fracture. No evidence of  hardware failure. Electronically Signed   By: Yvonne Kendall M.D.   On: 07/03/2022 12:46   DG FEMUR, MIN 2 VIEWS RIGHT  Result Date: 07/03/2022 CLINICAL DATA:  ORIF right femur. Elective surgery. Intraoperative fluoroscopy. EXAM: RIGHT FEMUR 2 VIEWS COMPARISON:  Right knee radiographs 06/30/2022, right hip radiographs 06/30/2022 FINDINGS: Images were performed intraoperatively without the presence of a radiologist. Interval lateral plate and screw fixation of the distal fibula, fixating the previously seen comminuted and displaced distal femoral diaphyseal metaphyseal fracture. Improved alignment. Redemonstration of total right knee arthroplasty hardware. Total fluoroscopy images: 6 Total fluoroscopy time: 48 seconds Total dose: Radiation Exposure Index (as provided by the fluoroscopic device): 5.51 mGy air Kerma Please see intraoperative findings for further detail. IMPRESSION: Intraoperative fluoroscopic guidance for ORIF of the distal right femur. Electronically Signed   By: Yvonne Kendall M.D.   On: 07/03/2022 11:26   DG C-Arm 1-60 Min-No Report  Result Date: 07/03/2022 Fluoroscopy was utilized by the requesting physician.  No radiographic interpretation.   DG Wrist Complete Right  Result Date: 07/02/2022 CLINICAL DATA:  Forearm and wrist pain. EXAM: RIGHT WRIST - COMPLETE 3+ VIEW; RIGHT FOREARM - 2 VIEW COMPARISON:  None Available. FINDINGS: No acute fracture or dislocation. No aggressive osseous lesion. Normal alignment. Generalized osteopenia. Mild osteoarthritis of the first [facility] joint and first MCP joint. Moderate osteoarthritis of the first IP joint. Soft tissue are unremarkable. No radiopaque foreign body or soft tissue emphysema. Peripheral vascular atherosclerotic disease. IMPRESSION: 1. No acute osseous injury of the right wrist and forearm. 2. Mild osteoarthritis of the first [place] joint and first MCP joint. Electronically Signed   By: Kathreen Devoid M.D.   On: 07/02/2022 11:41   DG Forearm  Right  Result Date: 07/02/2022 CLINICAL DATA:  Forearm and wrist pain. EXAM: RIGHT WRIST - COMPLETE 3+ VIEW; RIGHT FOREARM - 2 VIEW COMPARISON:  None Available. FINDINGS: No acute fracture or dislocation. No aggressive osseous lesion. Normal alignment. Generalized osteopenia. Mild osteoarthritis of the first [facility] joint and first MCP joint. Moderate osteoarthritis of the first IP joint. Soft tissue are unremarkable. No radiopaque foreign body or soft tissue emphysema. Peripheral vascular atherosclerotic disease. IMPRESSION: 1. No acute osseous injury of the right wrist and forearm. 2. Mild osteoarthritis of the first [place] joint and first MCP joint. Electronically Signed   By: Kathreen Devoid M.D.   On: 07/02/2022 11:41   Medications: Infusions:  sodium chloride 85 mL/hr at 07/04/22 0441   cefTRIAXone (ROCEPHIN)  IV     methocarbamol (ROBAXIN) IV Stopped (07/04/22  0431)    Scheduled Medications:  sodium chloride   Intravenous Once   acetaminophen  650 mg Oral Q6H   allopurinol  100 mg Oral BID   amLODipine  10 mg Oral Daily   Chlorhexidine Gluconate Cloth  6 each Topical Daily   docusate sodium  100 mg Oral BID   feeding supplement (NEPRO CARB STEADY)  237 mL Oral BID BM   heparin injection (subcutaneous)  5,000 Units Subcutaneous Q8H   hydrALAZINE  25 mg Oral Q8H   insulin aspart  0-9 Units Subcutaneous TID WC   levETIRAcetam  500 mg Oral q AM   metoprolol tartrate  100 mg Oral BID   mirtazapine  15 mg Oral QHS   multivitamin  1 tablet Oral QHS   pantoprazole (PROTONIX) IV  40 mg Intravenous Q24H   QUEtiapine  25 mg Oral QHS    have reviewed scheduled and prn medications.  Physical Exam: General:  NAD-  Heart:slightly tachy Lungs: clear Abdomen: soft, non tender Extremities: no edema     07/04/2022,10:20 AM  LOS: 4 days

## 2022-07-04 NOTE — Evaluation (Signed)
Occupational Therapy Evaluation Patient Details Name: [name] MRN: [id number] DOB: 11/13/1928 Today's Date: 07/04/2022   History of Present Illness Pt is a 88 y/o F admitted on 06/30/22. Pt was on vacation with her daughter in [place] when she fell & was taken to hospital. Her daughter [name] vaced her out. Pt found to have R complex distal femur fx. Pt underwent ORIF of distal femur fx by Dr. Marcelino Scot on 07/03/22. PMH: HTN, DM2, CKD Stage IV, dementia, diastolic CHF, GERD, glaucoma   Clinical Impression   Pt in bed upon therapy arrival with Daughter, [name] in room. [name] reports that pain meds had been provided approximately 30' prior to therapy arrival. At baseline, pt is Mod I with BADL tasks. She occasionally uses a Rollator when in unfamiliar settings or a cane when wearing heels. Due to cognition, she does receive assistance with bathing. Pt is currently presenting with a very high level of pain in her RLE; even when medicated. She currently requires 2 person assist for bed mobility and is unable to attempt functional transfer out of bed. Recommend at this time to d/c to SNF due to level of assist needed. Daughter is hopeful that pt will improve enough to go to CIR. If she does not, Daughter plans to take patient home. Discussed recommendation with Daughter during session. OT will continue to follow patient acutely.       Recommendations for follow up therapy are one component of a multi-disciplinary discharge planning process, led by the attending physician.  Recommendations may be updated based on patient status, additional functional criteria and insurance authorization.   Follow Up Recommendations  Skilled nursing-short term rehab (<3 hours/day)     Assistance Recommended at Discharge Frequent or constant Supervision/Assistance  Patient can return home with the following Two people to help with walking and/or transfers;Two people to help with bathing/dressing/bathroom;Assistance with  feeding;Assistance with cooking/housework;Direct supervision/assist for medications management;Direct supervision/assist for financial management;Help with stairs or ramp for entrance;Assist for transportation    Functional Status Assessment  Patient has had a recent decline in their functional status and demonstrates the ability to make significant improvements in function in a reasonable and predictable amount of time.  Equipment Recommendations  Hospital bed       Precautions / Restrictions Precautions Precautions: Fall Required Braces or Orthoses:  (RLE foam board when in bed) Restrictions Weight Bearing Restrictions: Yes RLE Weight Bearing: Non weight bearing      Mobility Bed Mobility Overal bed mobility: Needs Assistance Bed Mobility: Supine to Sit, Sit to Supine     Supine to sit: Total assist, +2 for physical assistance, HOB elevated Sit to supine: Total assist, +2 for physical assistance   General bed mobility comments: +2 assist to scoot to [facility] Patient Response: Anxious  Transfers Overall transfer level:  (Not assessed due to high pain level)        Balance Overall balance assessment: Needs assistance Sitting-balance support: Feet supported, Bilateral upper extremity supported Sitting balance-Leahy Scale: Zero Sitting balance - Comments: decreased willingness to shift weight to R hip in sitting 2/2 pain Postural control: Left lateral lean   Standing balance-Leahy Scale:  (Standing balance not assessed)         ADL either performed or assessed with clinical judgement   ADL Overall ADL's : Needs assistance/impaired     Grooming: Wash/dry face;Sitting;Minimal assistance   Upper Body Bathing: Moderate assistance;Sitting   Lower Body Bathing: Total assistance;+2 for physical assistance;Bed level   Upper Body  Dressing : Minimal assistance;Sitting   Lower Body Dressing: Total assistance;+2 for physical assistance;Bed level   Toilet Transfer:  (unable  to assess transfer during eval due to high level of pain)   Toileting- Clothing Manipulation and Hygiene: Total assistance;+2 for physical assistance;Bed level       Functional mobility during ADLs: Total assistance;+2 for physical assistance (bed level) General ADL Comments: Due to high level of pain, pt requires extensive assist with BADL tasks at this time.     Vision Baseline Vision/History: 0 No visual deficits Ability to See in Adequate Light: 0 Adequate Patient Visual Report: No change from baseline Vision Assessment?: No apparent visual deficits            Pertinent Vitals/Pain Pain Assessment Pain Assessment: Faces Faces Pain Scale: Hurts worst Pain Location: distal, lateral R thigh Pain Descriptors / Indicators: Aching, Crying, Grimacing, Guarding, Discomfort, Constant, Moaning Pain Intervention(s): Monitored during session, Other (comment) (encouraged controlled breathing technique)     Hand Dominance Right   Extremity/Trunk Assessment Upper Extremity Assessment Upper Extremity Assessment: Overall WFL for tasks assessed (Difficult to fully assess due to high level of pain)   Lower Extremity Assessment Lower Extremity Assessment: Defer to PT evaluation RLE Deficits / Details: Pt voluntarily moves toes on R foot when sitting EOB but not on command, no active movement noted in RLE. Pt with increased pain even when shifting weight to R hip in sitting.       Communication Communication Communication: HOH   Cognition Arousal/Alertness: Awake/alert Behavior During Therapy: Anxious Overall Cognitive Status: History of cognitive impairments - at baseline Area of Impairment: Following commands, Awareness, Safety/judgement, Problem solving     Following Commands: Follows one step commands inconsistently Safety/Judgement: Decreased awareness of safety, Decreased awareness of deficits   Problem Solving: Requires verbal cues, Requires tactile cues, Slow processing,  Decreased initiation, Difficulty sequencing General Comments: Pt with hx of dementia, responds to name.                Home Living Family/patient expects to be discharged to:: Private residence Living Arrangements: Children Available Help at Discharge: Family Type of Home: House Home Access: Stairs to enter Technical brewer of Steps: 5 Entrance Stairs-Rails: Left Home Layout: Two level;Able to live on main level with bedroom/bathroom     Bathroom Shower/Tub: Occupational psychologist: Standard Bathroom Accessibility: Yes How Accessible: Accessible via walker Home Equipment: Rollator (4 wheels);Cane - single point;Shower seat;BSC/3in1   Additional Comments: Daughter reports that her gait has decreased since having COVID in August. History of 3 falls prior to this recent fall. Has a BSC but bucket is missing. Pt has someone provide supervision for safety 2/2 pt's impaired cognition when daughter is at work during the day.  Lives With: Family    Prior Functioning/Environment Prior Level of Function : Needs assist;History of Falls (last six months)  Cognitive Assist : ADLs (cognitive)   ADLs (Cognitive): Intermittent cues Physical Assist : ADLs (physical)   ADLs (physical): Bathing Mobility Comments: Daughter needs to encourage use of Rollator (in unfamiliar environments) or cane (when wearing heels). ADLs Comments: [name] assistance with bathing. Able to dress herself.        OT Problem List: Decreased strength;Impaired balance (sitting and/or standing);Pain;Decreased knowledge of precautions;Decreased safety awareness;Decreased activity tolerance;Decreased knowledge of use of DME or AE      OT Treatment/Interventions: Self-care/ADL training;DME and/or AE instruction;Therapeutic activities;Balance training;Therapeutic exercise;Manual therapy;Neuromuscular education;Modalities;Energy conservation;Patient/family education    OT Goals(Current goals can be  found in the care plan section) Acute Rehab OT Goals Patient Stated Goal: None stated. Daughter's goal is for patient to go to CIR OT Goal Formulation: With family Time For Goal Achievement: 07/18/22 Potential to Achieve Goals: Fair  OT Frequency: Min 2X/week    Co-evaluation PT/OT/SLP Co-Evaluation/Treatment: Yes Reason for Co-Treatment: Complexity of the patient's impairments (multi-system involvement);For patient/therapist safety;To address functional/ADL transfers PT goals addressed during session: Mobility/safety with mobility;Balance OT goals addressed during session: Strengthening/ROM;ADL's and self-care      AM-PAC OT "6 Clicks" Daily Activity     Outcome Measure Help from another person eating meals?: A Little Help from another person taking care of personal grooming?: A Little Help from another person toileting, which includes using toliet, bedpan, or urinal?: Total Help from another person bathing (including washing, rinsing, drying)?: A Lot Help from another person to put on and taking off regular upper body clothing?: A Little Help from another person to put on and taking off regular lower body clothing?: Total 6 Click Score: 13   End of Session Equipment Utilized During Treatment: Oxygen Nurse Communication: Mobility status  Activity Tolerance: Patient limited by pain;No increased pain Patient left: in bed;with call bell/phone within reach;with family/visitor present  OT Visit Diagnosis: Muscle weakness (generalized) (M62.81);History of falling (Z91.81);Pain Pain - Right/Left: Right Pain - part of body: Leg                Time: 1027-1105 OT Time Calculation (min): 38 min Charges:  OT General Charges $OT Visit: 1 Visit OT Evaluation $OT Eval High Complexity: 1 High  [employer], OTR/L,CBIS  Supplemental OT - [facility] and [facility]   Aletheia Tangredi, Clarene Duke 07/04/2022, 11:25 AM

## 2022-07-04 NOTE — TOC Initial Note (Signed)
Transition of Care [facility]) - Initial/Assessment Note    Patient Details  Name: [name] MRN: [id number] Date of Birth: Dec 31, 1928  [facility] CM/SW Contact:    Joanne Chars, LCSW Phone Number: 07/04/2022, 12:31 PM  Clinical Narrative:    Pt oriented x1-2, CSW spoke with daughter [name] about DC recommendations for SNF.  Daughter states that SNF "is not going to happen."  Daughter would like pt to be evaluated for CIR, is aware that pt mobility right now is limited by pain, but does think she will demonstrate enough to be candidate for CIR in a few days.  If CIR is not possible, daughter will plan to take pt home.  Pt lives with daughter, no services except daughter does have sitter in place for when daughter works from her office in [place].  Pt is vaccinated for covid with multiple boosters.  If pt does have to DC home, daughter will be requesting a hospital bed and hoyer lift.  Pt already has a beside commode and wheelchair.    CSW spoke with Laura/CIR and asked her to review pt for potential for CIR.              Expected Discharge Plan:  (TBD) Barriers to Discharge: Continued Medical Work up   Patient Goals and CMS Choice        Expected Discharge Plan and Services Expected Discharge Plan:  (TBD) In-house Referral: Clinical Social Work   Post Acute Care Choice: IP Rehab Living arrangements for the past 2 months: Single Family Home                                      Prior Living Arrangements/Services Living arrangements for the past 2 months: Single Family Home Lives with:: Adult Children (daughter [name]) Patient language and need for interpreter reviewed:: Yes        Need for Family Participation in Patient Care: Yes (Comment) Care giver support system in place?: Yes (comment) Current home services: Other (comment) (pt has sitter for when daughter works from the office) Criminal Activity/Legal Involvement Pertinent to Current  Situation/Hospitalization: No - Comment as needed  Activities of Daily Living      Permission Sought/Granted                  Emotional Assessment Appearance:: Appears stated age Attitude/Demeanor/Rapport: Unable to Assess Affect (typically observed): Unable to Assess Orientation: : Oriented to Self      Admission diagnosis:  Closed fracture of right distal femur ([place]) [S72.401A] Patient Active Problem List   Diagnosis Date Noted   Closed fracture of right distal femur ([place]) 06/30/2022   Chronic diastolic CHF (congestive heart failure) ([place]) 06/30/2022   history of seizures 06/30/2022   Acute respiratory failure with hypoxia ([place]) 06/30/2022   Acute on chronic anemia 06/30/2022   Pneumonia due to COVID-19 virus 03/10/2022   Dementia ([place]) 03/10/2022   Heart failure ([place]) 03/10/2022   AKI (acute kidney injury) ([place]) 03/10/2022   Acute renal failure superimposed on stage 4 chronic kidney disease ([place]) 03/10/2022   Type 2 diabetes mellitus with complication, without long-term current use of insulin ([place])    Glaucoma    HHNC (hyperglycemic hyperosmolar nonketotic coma) ([place]) 11/24/2020   Hyperglycemia 11/24/2020   UTI (urinary tract infection) 29/52/8413   Acute metabolic encephalopathy 24/40/1027   Hypokalemia 11/24/2020   GERD (gastroesophageal reflux disease)  Hypertension    PCP:  Seward Carol, MD Pharmacy:   [facility] [place], [place] [address] [place] [address] Phone: [phone] Fax: [phone]     Social Determinants of Health (SDOH) Interventions    Readmission Risk Interventions     No data to display

## 2022-07-04 NOTE — Progress Notes (Signed)
Inpatient Rehab Admissions Coordinator:    I screened Pt. At request of TOC. Pt. Currently is total A +2 with bed mobility, unable to transfer OOB due to pain,  and unable to tolerate the intensity of CIR. [employer] [facility] is also very unlikely to approve CIR for her diagnoses. I will not pursue consult at this time.   Clemens Catholic, [place], [place] Admissions Coordinator  [phone] ([place]) [phone] (office)

## 2022-07-04 NOTE — Progress Notes (Signed)
Niece at bed side verbalized that she will call pt's daughter before foley cath removal.

## 2022-07-04 NOTE — Progress Notes (Signed)
PROGRESS NOTE    [name]  MBW:[id number] DOB: 10-Oct-1928 DOA: 06/30/2022 PCP: Seward Carol, MD   Brief Narrative:  88 year old female with medical history of hypertension, diabetes mellitus type 2, stage IV CKD, dementia, diastolic CHF, GERD who was on vacation with her daughter in [place] when she suffered a mechanical fall.  Patient went to balcony when she missed a step and fell down.  Her daughter found her on her back with right leg shortened and on outwards.  She was transferred to hospital.  Also surgeons at [place] told her that was a complex fracture and did not think it was safe to travel home.  Her daughter medi vaced her out today and travel time was 3 hours and 6 minutes. There is no paper chart.  During hospitalization she was found to have right distal femur periprosthetic fracture, acute anemia and off-and-on agitation/hallucination. Underwent OR today, Renal func is improving.    Assessment & Plan:  Principal Problem:   Closed fracture of right distal femur ([place]) Active Problems:   Acute respiratory failure with hypoxia (HCC)   Acute renal failure superimposed on stage 4 chronic kidney disease (HCC)   Acute on chronic anemia   Chronic diastolic CHF (congestive heart failure) (HCC)   Type 2 diabetes mellitus with complication, without long-term current use of insulin (HCC)   Hypertension   GERD (gastroesophageal reflux disease)   history of seizures   Dementia ([place])       Right distal femur periprosthetic fracture Mechanical fall -Sustained fall in [place].  S/p surgical repair by Dr. Marcelino Scot on 07/03/2022.  Pain uncontrolled.  She is on as needed medications.  Niece was advised to let the nurses know anytime patient is in pain.   -CT head negative   Acute hypoxemic respiratory failure: - Elevated D-dimer, likely from atelectasis.  Low suspicion for PE.  Currently saturating 100% on 2 L.  Perhaps she does not need that.  Aggressive use of incentive  spirometer and flutter valve.  Procalcitonin is negative, BNP 309   Iron deficiency anemia/acute on chronic anemia -Baseline hemoglobin 9, admission hemoglobin less than 7.  Status post transfusion, hemoglobin stable ever since.   Acute kidney injury on CKD stage III -Patient's baseline creatinine 1.47 as of 03/15/2022.  Admission creatinine 4.45, creatinine continues to improve with IV fluids.  This morning 1.79 today.  Nephrology following.  Bacteriuria, urinary urgency Positive dipstick - CK level is normal.  Off-and-on hallucination.  Received 1 dose of Rocephin.  Does not seem to have UTI.   History of seizures -Has been seizure-free for many years.  Continue Keppra.    Dementia without behavior disturbance -Continue Remeron, Seroquel nightly   Diabetes mellitus type 2 -Hemoglobin A1c 6.4 -Sliding scale and Accu-Cheks  Hypomagnesemia - As needed repletion  DVT prophylaxis: Subcu heparin Code Status: Full code Family Communication: Niece at the bedside. Status is: Inpatient Remains inpatient appropriate because: Will likely need SNF placement once cleared by therapeutics and placement arranged.  Nutritional status    Signs/Symptoms: estimated needs  Interventions: Nepro shake, MVI  Body mass index is 25.08 kg/m.         Subjective: Seen and examined.  She says she is does not feel well but she is not coming up with any specific complaint.  Niece at the bedside.  Examination:  General exam: Appears calm and comfortable  Respiratory system: Clear to auscultation. Respiratory effort normal. Cardiovascular system: S1 & S2 heard, RRR. No  JVD, murmurs, rubs, gallops or clicks. No pedal edema. Gastrointestinal system: Abdomen is nondistended, soft and nontender. No organomegaly or masses felt. Normal bowel sounds heard. Central nervous system: Alert and oriented x 1-2. No focal neurological deficits. Skin: No rashes, lesions or ulcers.     Objective: Vitals:    07/03/22 1300 07/03/22 1420 07/03/22 2012 07/04/22 0013  BP: (!) 169/65 (!) 174/70 (!) 157/73 (!) 172/73  Pulse: (!) 107 (!) 107  (!) 103  Resp:  '15 18 16  '$ Temp:  98.4 F (36.9 C) 98.3 F (36.8 C) 98 F (36.7 C)  TempSrc:  Axillary Oral Oral  SpO2: 98% 98% 97% 99%  Weight:      Height:        Intake/Output Summary (Last 24 hours) at 07/04/2022 0831 Last data filed at 07/04/2022 0651 Gross per 24 hour  Intake 2278.55 ml  Output 1675 ml  Net 603.55 ml    Filed Weights   06/30/22 1700 07/01/22 2215 07/02/22 0440  Weight: 59.1 kg 60.2 kg 60.2 kg     Data Reviewed:   CBC: Recent Labs  Lab 06/30/22 1906 06/30/22 2116 07/01/22 0706 07/01/22 1204 07/02/22 0357 07/03/22 0225 07/04/22 0324  WBC 10.3   < > 9.0 8.1 9.3 7.4 9.1  NEUTROABS 8.3*  --   --   --   --   --   --   HGB 8.3*   < > 7.5* 6.9* 9.0* 9.6* 10.3*  HCT 25.4*   < > 23.5* 22.2* 25.4* 28.9* 30.2*  MCV 90.7   < > 92.5 93.7 84.9 87.6 87.0  PLT 204   < > 196 188 198 206 214   < > = values in this interval not displayed.    Basic Metabolic Panel: Recent Labs  Lab 06/30/22 1906 07/01/22 0122 07/02/22 0357 07/03/22 0225 07/04/22 0324  NA 137 136 138 136 139  K 4.6 4.9 4.5 3.9 4.3  CL 106 107 107 107 102  CO2 21* 18* 20* 20* 24  GLUCOSE 169* 117* 132* 160* 171*  BUN 46* 49* 51* 40* 30*  CREATININE 4.45* 4.69* 3.76* 2.52* 1.79*  CALCIUM 8.3* 8.1* 8.6* 8.6* 9.9  MG  --   --   --  1.5* 2.6*    GFR: Estimated Creatinine Clearance: 17.1 mL/min (A) (by C-G formula based on SCr of 1.79 mg/dL (H)). Liver Function Tests: Recent Labs  Lab 06/30/22 1906 07/02/22 0357  AST 29 26  ALT 18 16  ALKPHOS 147* 148*  BILITOT 0.4 0.9  PROT 6.1* 6.1*  ALBUMIN 2.5* 2.4*    No results for input(s): "LIPASE", "AMYLASE" in the last 168 hours. No results for input(s): "AMMONIA" in the last 168 hours. Coagulation Profile: Recent Labs  Lab 06/30/22 1906  INR 1.3*    Cardiac Enzymes: Recent Labs  Lab  07/02/22 0357  CKTOTAL 188    BNP (last 3 results) No results for input(s): "PROBNP" in the last 8760 hours. HbA1C: No results for input(s): "HGBA1C" in the last 72 hours. CBG: Recent Labs  Lab 07/03/22 1052 07/03/22 1203 07/03/22 1613 07/03/22 2013 07/04/22 0759  GLUCAP 147* 179* 194* 202* 158*    Lipid Profile: No results for input(s): "CHOL", "HDL", "LDLCALC", "TRIG", "CHOLHDL", "LDLDIRECT" in the last 72 hours. Thyroid Function Tests: No results for input(s): "TSH", "T4TOTAL", "FREET4", "T3FREE", "THYROIDAB" in the last 72 hours. Anemia Panel: No results for input(s): "VITAMINB12", "FOLATE", "FERRITIN", "TIBC", "IRON", "RETICCTPCT" in the last 72 hours.  Sepsis Labs: Recent  Labs  Lab 07/03/22 0225  PROCALCITON <0.10     Recent Results (from the past 240 hour(s))  Urine Culture     Status: Abnormal   Collection Time: 07/02/22 10:15 AM   Specimen: Urine, Clean Catch  Result Value Ref Range Status   Specimen Description URINE, CLEAN CATCH  Final   Special Requests   Final    NONE Performed at [facility], [address], [place], [place] [address]    Culture (A)  Final    >=100,000 COLONIES/mL ESCHERICHIA COLI >=100,000 COLONIES/mL KLEBSIELLA OXYTOCA    Report Status 07/04/2022 FINAL  Final   Organism ID, Bacteria ESCHERICHIA COLI (A)  Final   Organism ID, Bacteria KLEBSIELLA OXYTOCA (A)  Final      Susceptibility   Escherichia coli - MIC*    AMPICILLIN 8 SENSITIVE Sensitive     CEFAZOLIN <=4 SENSITIVE Sensitive     CEFEPIME <=0.12 SENSITIVE Sensitive     CEFTRIAXONE <=0.25 SENSITIVE Sensitive     CIPROFLOXACIN <=0.25 SENSITIVE Sensitive     GENTAMICIN <=1 SENSITIVE Sensitive     IMIPENEM <=0.25 SENSITIVE Sensitive     NITROFURANTOIN <=16 SENSITIVE Sensitive     TRIMETH/SULFA <=20 SENSITIVE Sensitive     AMPICILLIN/SULBACTAM <=2 SENSITIVE Sensitive     PIP/TAZO <=4 SENSITIVE Sensitive     * >=100,000 COLONIES/mL ESCHERICHIA COLI   Klebsiella  oxytoca - MIC*    AMPICILLIN RESISTANT Resistant     CEFAZOLIN <=4 SENSITIVE Sensitive     CEFEPIME <=0.12 SENSITIVE Sensitive     CEFTRIAXONE <=0.25 SENSITIVE Sensitive     CIPROFLOXACIN <=0.25 SENSITIVE Sensitive     GENTAMICIN <=1 SENSITIVE Sensitive     IMIPENEM <=0.25 SENSITIVE Sensitive     NITROFURANTOIN 32 SENSITIVE Sensitive     TRIMETH/SULFA <=20 SENSITIVE Sensitive     AMPICILLIN/SULBACTAM 4 SENSITIVE Sensitive     PIP/TAZO <=4 SENSITIVE Sensitive     * >=100,000 COLONIES/mL KLEBSIELLA OXYTOCA         Radiology Studies: DG Knee Right Port  Result Date: 07/03/2022 CLINICAL DATA:  Closed fracture right distal femur status post ORIF. EXAM: PORTABLE RIGHT KNEE - 1-2 VIEW COMPARISON:  Right knee radiographs 06/30/2022 FINDINGS: Interval lateral plate and screw fixation of the previously seen markedly comminuted distal fibular diaphyseal and metaphyseal fracture. There is again mild diastasis of multiple distal fibular fracture fragments, however the fixation hardware screws are distal and proximal to this, and no hardware failure is seen. Redemonstration of total right knee arthroplasty hardware. Expected postoperative air within the lateral thigh and knee joint. IMPRESSION: Interval lateral plate and screw fixation of the previously seen markedly comminuted distal fibular diaphyseal and metaphyseal fracture. No evidence of hardware failure. Electronically Signed   By: Yvonne Kendall M.D.   On: 07/03/2022 12:46   DG FEMUR, MIN 2 VIEWS RIGHT  Result Date: 07/03/2022 CLINICAL DATA:  ORIF right femur. Elective surgery. Intraoperative fluoroscopy. EXAM: RIGHT FEMUR 2 VIEWS COMPARISON:  Right knee radiographs 06/30/2022, right hip radiographs 06/30/2022 FINDINGS: Images were performed intraoperatively without the presence of a radiologist. Interval lateral plate and screw fixation of the distal fibula, fixating the previously seen comminuted and displaced distal femoral diaphyseal  metaphyseal fracture. Improved alignment. Redemonstration of total right knee arthroplasty hardware. Total fluoroscopy images: 6 Total fluoroscopy time: 48 seconds Total dose: Radiation Exposure Index (as provided by the fluoroscopic device): 5.51 mGy air Kerma Please see intraoperative findings for further detail. IMPRESSION: Intraoperative fluoroscopic guidance for ORIF of the  distal right femur. Electronically Signed   By: Yvonne Kendall M.D.   On: 07/03/2022 11:26   DG C-Arm 1-60 Min-No Report  Result Date: 07/03/2022 Fluoroscopy was utilized by the requesting physician.  No radiographic interpretation.   DG Wrist Complete Right  Result Date: 07/02/2022 CLINICAL DATA:  Forearm and wrist pain. EXAM: RIGHT WRIST - COMPLETE 3+ VIEW; RIGHT FOREARM - 2 VIEW COMPARISON:  None Available. FINDINGS: No acute fracture or dislocation. No aggressive osseous lesion. Normal alignment. Generalized osteopenia. Mild osteoarthritis of the first [facility] joint and first MCP joint. Moderate osteoarthritis of the first IP joint. Soft tissue are unremarkable. No radiopaque foreign body or soft tissue emphysema. Peripheral vascular atherosclerotic disease. IMPRESSION: 1. No acute osseous injury of the right wrist and forearm. 2. Mild osteoarthritis of the first [place] joint and first MCP joint. Electronically Signed   By: Kathreen Devoid M.D.   On: 07/02/2022 11:41   DG Forearm Right  Result Date: 07/02/2022 CLINICAL DATA:  Forearm and wrist pain. EXAM: RIGHT WRIST - COMPLETE 3+ VIEW; RIGHT FOREARM - 2 VIEW COMPARISON:  None Available. FINDINGS: No acute fracture or dislocation. No aggressive osseous lesion. Normal alignment. Generalized osteopenia. Mild osteoarthritis of the first [facility] joint and first MCP joint. Moderate osteoarthritis of the first IP joint. Soft tissue are unremarkable. No radiopaque foreign body or soft tissue emphysema. Peripheral vascular atherosclerotic disease. IMPRESSION: 1. No acute osseous injury of the right  wrist and forearm. 2. Mild osteoarthritis of the first [place] joint and first MCP joint. Electronically Signed   By: Kathreen Devoid M.D.   On: 07/02/2022 11:41   DG Chest 1 View  Result Date: 07/02/2022 CLINICAL DATA:  [id number] Repeat prescription issue [id number] EXAM: CHEST  1 VIEW COMPARISON:  06/30/2022 chest radiograph. FINDINGS: Surgical clips again noted in the left axilla. Stable cardiomediastinal silhouette with mild cardiomegaly. No pneumothorax. No pleural effusion. No overt pulmonary edema. Mild streaky bibasilar scarring versus atelectasis, unchanged. IMPRESSION: 1. Stable mild cardiomegaly without overt pulmonary edema. 2. Stable mild streaky bibasilar scarring versus atelectasis. Electronically Signed   By: Ilona Sorrel M.D.   On: 07/02/2022 09:26        Scheduled Meds:  sodium chloride   Intravenous Once   acetaminophen  650 mg Oral Q6H   allopurinol  100 mg Oral BID   Chlorhexidine Gluconate Cloth  6 each Topical Daily   docusate sodium  100 mg Oral BID   feeding supplement (NEPRO CARB STEADY)  237 mL Oral BID BM   heparin injection (subcutaneous)  5,000 Units Subcutaneous Q8H   hydrALAZINE  25 mg Oral Q8H   insulin aspart  0-9 Units Subcutaneous TID WC   levETIRAcetam  500 mg Oral q AM   mirtazapine  15 mg Oral QHS   multivitamin  1 tablet Oral QHS   pantoprazole (PROTONIX) IV  40 mg Intravenous Q24H   QUEtiapine  25 mg Oral QHS   Continuous Infusions:  sodium chloride 85 mL/hr at 07/04/22 0441   cefTRIAXone (ROCEPHIN)  IV     methocarbamol (ROBAXIN) IV Stopped (07/04/22 0431)     LOS: 4 days   Time spent= 35 mins    Darliss Cheney, MD [facility]  If 7PM-7AM, please contact night-coverage  07/04/2022, 8:31 AM

## 2022-07-04 NOTE — Progress Notes (Signed)
Orthopaedic Trauma Service Progress Note  Patient ID: [name] MRN: [id number] DOB/AGE: Oct 13, 1928 88 y.o.  Subjective:  Severe pain this am  Trying to sit on EOB with therapy  Pt has been lying down since fall last Wednesday   Daughter states she slept ok last night   Labs look good this am  Renal function continues to improve    ROS As above  Objective:   VITALS:   Vitals:   07/03/22 1300 07/03/22 1420 07/03/22 2012 07/04/22 0013  BP: (!) 169/65 (!) 174/70 (!) 157/73 (!) 172/73  Pulse: (!) 107 (!) 107  (!) 103  Resp:  '15 18 16  '$ Temp:  98.4 F (36.9 C) 98.3 F (36.8 C) 98 F (36.7 C)  TempSrc:  Axillary Oral Oral  SpO2: 98% 98% 97% 99%  Weight:      Height:        Estimated body mass index is 25.08 kg/m as calculated from the following:   Height as of this encounter: '5\' 1"'$  (1.549 m).   Weight as of this encounter: 60.2 kg.   Intake/Output      12/05 0701 12/06 0700 12/06 0701 12/07 0700   P.O.     I.V. (mL/kg) 823.6 (13.7)    Other 1050    IV Piggyback 405    Total Intake(mL/kg) 2278.6 (37.8)    Urine (mL/kg/hr) 1575 (1.1)    Blood 100    Total Output 1675    Net +603.6           LABS  Results for orders placed or performed during the hospital encounter of 06/30/22 (from the past 24 hour(s))  Glucose, capillary     Status: Abnormal   Collection Time: 07/03/22 10:52 AM  Result Value Ref Range   Glucose-Capillary 147 (H) 70 - 99 mg/dL  Glucose, capillary     Status: Abnormal   Collection Time: 07/03/22 12:03 PM  Result Value Ref Range   Glucose-Capillary 179 (H) 70 - 99 mg/dL  Glucose, capillary     Status: Abnormal   Collection Time: 07/03/22  4:13 PM  Result Value Ref Range   Glucose-Capillary 194 (H) 70 - 99 mg/dL  Glucose, capillary     Status: Abnormal   Collection Time: 07/03/22  8:13 PM  Result Value Ref Range   Glucose-Capillary 202 (H) 70 - 99 mg/dL   Basic metabolic panel     Status: Abnormal   Collection Time: 07/04/22  3:24 AM  Result Value Ref Range   Sodium 139 135 - 145 mmol/L   Potassium 4.3 3.5 - 5.1 mmol/L   Chloride 102 98 - 111 mmol/L   CO2 24 22 - 32 mmol/L   Glucose, Bld 171 (H) 70 - 99 mg/dL   BUN 30 (H) 8 - 23 mg/dL   Creatinine, Ser 1.79 (H) 0.44 - 1.00 mg/dL   Calcium 9.9 8.9 - 10.3 mg/dL   GFR, Estimated 26 (L) >60 mL/min   Anion gap 13 5 - 15  CBC     Status: Abnormal   Collection Time: 07/04/22  3:24 AM  Result Value Ref Range   WBC 9.1 4.0 - 10.5 K/uL   RBC 3.47 (L) 3.87 - 5.11 MIL/uL   Hemoglobin 10.3 (L) 12.0 - 15.0 g/dL   HCT 30.2 (L) 36.0 -  46.0 %   MCV 87.0 80.0 - 100.0 fL   MCH 29.7 26.0 - 34.0 pg   MCHC 34.1 30.0 - 36.0 g/dL   RDW 16.4 (H) 11.5 - 15.5 %   Platelets 214 150 - 400 K/uL   nRBC 0.0 0.0 - 0.2 %  Magnesium     Status: Abnormal   Collection Time: 07/04/22  3:24 AM  Result Value Ref Range   Magnesium 2.6 (H) 1.7 - 2.4 mg/dL  Glucose, capillary     Status: Abnormal   Collection Time: 07/04/22  7:59 AM  Result Value Ref Range   Glucose-Capillary 158 (H) 70 - 99 mg/dL     PHYSICAL EXAM:   Gen: sitting on EOB with support, looks uncomfortable  Ext:       Right Lower Extremity   Dressing R thigh clean, dry and intact  Ext warm   + DP pulse  No DCT  Compartments soft   Motor and sensory functions grossly intact  Assessment/Plan: 1 Day Post-Op    Anti-infectives (From admission, onward)    Start     Dose/Rate Route Frequency Ordered Stop   07/04/22 1200  cefTRIAXone (ROCEPHIN) 1 g in sodium chloride 0.9 % 100 mL IVPB        1 g 200 mL/hr over 30 Minutes Intravenous Every 24 hours 07/02/22 1335 07/07/22 1159   07/03/22 0726  sodium chloride 0.9 % with cefTRIAXone (ROCEPHIN) ADS Med       Note to Pharmacy: Cameron Sprang M: cabinet override      07/03/22 0726 07/03/22 0838   07/03/22 0700  ceFAZolin (ANCEF) IVPB 2g/100 mL premix  Status:  Discontinued        2 g 200  mL/hr over 30 Minutes Intravenous On call to O.R. 07/02/22 1048 07/02/22 1104   07/03/22 0700  cefTRIAXone (ROCEPHIN) 2 g in sodium chloride 0.9 % 100 mL IVPB        2 g 200 mL/hr over 30 Minutes Intravenous To Surgery 07/02/22 1335 07/03/22 0835   07/02/22 1200  cefTRIAXone (ROCEPHIN) 1 g in sodium chloride 0.9 % 100 mL IVPB        1 g 200 mL/hr over 30 Minutes Intravenous Every 24 hours 07/02/22 1056 07/02/22 1320     .  POD/HD#: 1  88 y/o female with ground-level fall right periprosthetic distal femur fracture  -Ground-level fall  -Right periprosthetic distal femur fracture s/p ORIF   Nonweightbearing right leg for 6 weeks  Restricted range of motion right knee  Dressing changes as needed starting on 07/05/2022  Ice and elevate for swelling control and pain  Float heels off bed   PT and OT evaluations  PT- please teach HEP for R knee ROM- AROM, PROM. No ROM restrictions.  Quad sets, SLR, LAQ, SAQ, heel slides, stretching  Ankle theraband program, heel cord stretching, toe towel curls, etc  No pillows under bend of knee when at rest, ok to place under heel to help work on extension. Can also use zero knee bone foam if available   - Pain management:  Multimodal   Increase scheduled Tylenol to 1000 mg every 6 hours   Added scheduled tramadol 50 mg every 12 hours, hoping to be able to scale back on scheduled tramadol in the next 48 hours   Added 12.5 mcg of fentanyl IV every 8 hours for severe breakthrough pain.  Only to be given after all orals given     Oxy IR '5mg'$  q6h prn  still available   Will order continuous pulse ox  - ABL anemia/Hemodynamics  Stable  Improved   Did receive TXA postop  - Medical issues   Per medicine and nephrology   - DVT/PE prophylaxis:  Continue sq heparin   Likely transition to eliquis in next day or two  - ID:   On Rocephin for UTI.  This is adequate for perioperative coverage - Metabolic Bone Disease:  Fragility  fracture   Vitamin d levels are ok   - Activity:  As above  - Impediments to fracture healing:  Age/fragility fracture/bone quality  - Dispo:  Ortho issues addressed  PT/OT evals   SNF vs CIR     Optimize pain control to maximize her ability to participate with therapies   Jari Pigg, PA-C [phone] (C) 07/04/2022, 9:44 AM  [facility] [place] [address] [phone] Jenetta [phone] (F)    After 5pm and on the weekends please log on to Amion, go to orthopaedics and the look under the [facility] Call for the provider(s) on call. You can also call our office at [phone] and then follow the prompts to be connected to the call team.  Patient ID: Norva Pavlov, female   DOB: 09-Jun-1929, 88 y.o.   MRN: [id number]

## 2022-07-04 NOTE — Evaluation (Signed)
Physical Therapy Evaluation Patient Details Name: [name] MRN: [id number] DOB: 1929-05-04 Today's Date: 07/04/2022  History of Present Illness  Pt is a 88 y/o F admitted on 06/30/22. Pt was on vacation with her daughter in [place] when she fell & was taken to hospital. Her daughter [name] vaced her out. Pt found to have R complex distal femur fx. Pt underwent ORIF of distal femur fx by Dr. Marcelino Scot on 07/03/22. PMH: HTN, DM2, CKD Stage IV, dementia, diastolic CHF, GERD, glaucoma  Clinical Impression  Pt seen for PT evaluation with co-tx with OT for pt & therapists safety, pt's daughter [name]) present to provide PLOF & home set up information. Prior to admission pt was independent<>Mod I with mobility with PRN use of rollator or SPC. Pt presents with significant RLE pain despite being premedicated, as well as baseline cognitive deficits that may impact mobility. Pt requires total assist +2 for supine<>sit & max assist for static sitting EOB. Pt unable to weight shift to R hip in sitting 2/2 pain, but does tolerate sitting EOB ~15-20 minutes. Pt's daughter is hopeful pt can go to rehab, but explained pt's current inability to tolerate 3 hours of daily therapy & current recommendation of SNF for rehab. Pt's daughter would rather take pt home than have pt go to SNF so discussed possible DME recommendations if that is the case. Will see pt with alternating schedule with OT to allow pt to receive daily therapy in hopes that pt will progress and d/c recommendations can be updated, if appropriate.   Pt received & left on 3L/min via nasal cannula, SpO2 >90%. When on room air, lowest SpO2 reading 90%. Max HR noted to be 141 bpm sitting EOB, does decrease with extra time.       Recommendations for follow up therapy are one component of a multi-disciplinary discharge planning process, led by the attending physician.  Recommendations may be updated based on patient status, additional functional criteria and  insurance authorization.  Follow Up Recommendations Skilled nursing-short term rehab (<3 hours/day) Can patient physically be transported by private vehicle: No    Assistance Recommended at Discharge Frequent or constant Supervision/Assistance  Patient can return home with the following  Two people to help with walking and/or transfers;Two people to help with bathing/dressing/bathroom;Help with stairs or ramp for entrance;Direct supervision/assist for medications management;Assist for transportation;Assistance with feeding;Direct supervision/assist for financial management;Assistance with Forensic psychologist (measurements PT);Wheelchair cushion (measurements PT);Rolling walker (2 wheels);BSC/3in1;Hospital bed (manual hoyer lift & sling)  Recommendations for Other Services       Functional Status Assessment Patient has had a recent decline in their functional status and demonstrates the ability to make significant improvements in function in a reasonable and predictable amount of time.     Precautions / Restrictions Precautions Precautions: Fall Required Braces or Orthoses:  (RLE foam board when in bed) Restrictions Weight Bearing Restrictions: Yes RLE Weight Bearing: Non weight bearing      Mobility  Bed Mobility Overal bed mobility: Needs Assistance Bed Mobility: Supine to Sit, Sit to Supine     Supine to sit: Total assist, +2 for physical assistance, HOB elevated Sit to supine: Total assist, +2 for physical assistance, HOB elevated   General bed mobility comments: +2 assist to scoot to [facility]    Transfers                        Ambulation/Gait  Stairs            Wheelchair Mobility    Modified Rankin (Stroke Patients Only)       Balance Overall balance assessment: Needs assistance Sitting-balance support: Feet supported, Bilateral upper extremity supported Sitting balance-Leahy Scale:  Zero Sitting balance - Comments: decreased willingness to shift weight to R hip in sitting 2/2 pain Postural control: Left lateral lean                                   Pertinent Vitals/Pain Pain Assessment Pain Assessment: Faces Faces Pain Scale: Hurts worst Pain Location: distal, lateral R thigh Pain Descriptors / Indicators: Aching, Crying, Grimacing, Guarding, Discomfort, Constant Pain Intervention(s): Monitored during session    Home Living Family/patient expects to be discharged to:: Private residence Living Arrangements: Children Available Help at Discharge: Family Type of Home: House Home Access: Stairs to enter Entrance Stairs-Rails: Left Entrance Stairs-Number of Steps: 5   Home Layout: Two level;Able to live on main level with bedroom/bathroom Home Equipment: Rollator (4 wheels);Cane - single point;Shower seat;BSC/3in1 Additional Comments: Daughter reports that her gait has decreased since having COVID in August. History of 3 falls prior to this recent fall. Has a BSC but bucket is missing. Pt has someone provide supervision for safety 2/2 pt's impaired cognition when daughter is at work during the day.    Prior Function Prior Level of Function : Needs assist;History of Falls (last six months)  Cognitive Assist : ADLs (cognitive)   ADLs (Cognitive): Intermittent cues Physical Assist : ADLs (physical)   ADLs (physical): Bathing Mobility Comments: Daughter needs to encourage use of Rollator (in unfamiliar environments) or cane (when wearing heels). ADLs Comments: [name] assistance with bathing. Able to dress herself.     Hand Dominance   Dominant Hand: Right    Extremity/Trunk Assessment   Upper Extremity Assessment Upper Extremity Assessment: Overall WFL for tasks assessed    Lower Extremity Assessment Lower Extremity Assessment: Generalized weakness;RLE deficits/detail RLE Deficits / Details: Pt voluntarily moves toes on R foot when  sitting EOB but not on command, no active movement noted in RLE. Pt with increased pain even when shifting weight to R hip in sitting.       Communication   Communication: HOH  Cognition Arousal/Alertness: Awake/alert Behavior During Therapy: Anxious Overall Cognitive Status: History of cognitive impairments - at baseline Area of Impairment: Following commands, Awareness, Safety/judgement, Problem solving                       Following Commands: Follows one step commands inconsistently, Follows one step commands with increased time Safety/Judgement: Decreased awareness of safety, Decreased awareness of deficits Awareness: Intellectual Problem Solving: Requires verbal cues, Requires tactile cues, Slow processing, Decreased initiation, Difficulty sequencing General Comments: Pt with hx of dementia, responds to name.        General Comments      Exercises     Assessment/Plan    PT Assessment Patient needs continued PT services  PT Problem List Decreased strength;Cardiopulmonary status limiting activity;Pain;Decreased range of motion;Decreased balance;Decreased activity tolerance;Decreased safety awareness;Decreased knowledge of use of DME;Decreased mobility;Decreased knowledge of precautions       PT Treatment Interventions DME instruction;Therapeutic exercise;Gait training;Balance training;Stair training;Neuromuscular re-education;Functional mobility training;Therapeutic activities;Patient/family education;Manual techniques;Modalities    PT Goals (Current goals can be found in the Care Plan section)  Acute Rehab PT Goals Patient Stated Goal: decreased pain,  get better/return to PLOF, go home PT Goal Formulation: With patient/family Time For Goal Achievement: 07/18/22 Potential to Achieve Goals: Fair    Frequency Min 3X/week     Co-evaluation PT/OT/SLP Co-Evaluation/Treatment: Yes Reason for Co-Treatment: Complexity of the patient's impairments (multi-system  involvement);For patient/therapist safety;To address functional/ADL transfers PT goals addressed during session: Mobility/safety with mobility;Balance OT goals addressed during session: Strengthening/ROM;ADL's and self-care       AM-PAC PT "6 Clicks" Mobility  Outcome Measure Help needed turning from your back to your side while in a flat bed without using bedrails?: Total Help needed moving from lying on your back to sitting on the side of a flat bed without using bedrails?: Total Help needed moving to and from a bed to a chair (including a wheelchair)?: Total Help needed standing up from a chair using your arms (e.g., wheelchair or bedside chair)?: Total Help needed to walk in hospital room?: Total Help needed climbing 3-5 steps with a railing? : Total 6 Click Score: 6    End of Session Equipment Utilized During Treatment: Oxygen Activity Tolerance: Patient limited by pain Patient left: in bed;with call bell/phone within reach;with bed alarm set;with SCD's reapplied;with family/visitor present (pillow under BLE feet) Nurse Communication: Mobility status PT Visit Diagnosis: Muscle weakness (generalized) (M62.81);Pain;Other abnormalities of gait and mobility (R26.89);Difficulty in walking, not elsewhere classified (R26.2) Pain - Right/Left: Right Pain - part of body: Leg    Time: 1027-1100 PT Time Calculation (min) (ACUTE ONLY): 33 min   Charges:   PT Evaluation $PT Eval High Complexity: 1 High          Lavone Nian, PT, DPT 07/04/22, 11:20 AM   Waunita Schooner 07/04/2022, 11:16 AM

## 2022-07-05 DIAGNOSIS — S72491A Other fracture of lower end of right femur, initial encounter for closed fracture: Secondary | ICD-10-CM | POA: Diagnosis not present

## 2022-07-05 LAB — BASIC METABOLIC PANEL
Anion gap: 10 (ref 5–15)
BUN: 33 mg/dL — ABNORMAL HIGH (ref 8–23)
CO2: 21 mmol/L — ABNORMAL LOW (ref 22–32)
Calcium: 9.2 mg/dL (ref 8.9–10.3)
Chloride: 105 mmol/L (ref 98–111)
Creatinine, Ser: 1.89 mg/dL — ABNORMAL HIGH (ref 0.44–1.00)
GFR, Estimated: 25 mL/min — ABNORMAL LOW (ref >60)
Glucose, Bld: 123 mg/dL — ABNORMAL HIGH (ref 70–99)
Potassium: 4.1 mmol/L (ref 3.5–5.1)
Sodium: 136 mmol/L (ref 135–145)

## 2022-07-05 LAB — CBC
HCT: 28.6 % — ABNORMAL LOW (ref 36.0–46.0)
Hemoglobin: 9.4 g/dL — ABNORMAL LOW (ref 12.0–15.0)
MCH: 29.1 pg (ref 26.0–34.0)
MCHC: 32.9 g/dL (ref 30.0–36.0)
MCV: 88.5 fL (ref 80.0–100.0)
Platelets: 223 10*3/uL (ref 150–400)
RBC: 3.23 MIL/uL — ABNORMAL LOW (ref 3.87–5.11)
RDW: 16.6 % — ABNORMAL HIGH (ref 11.5–15.5)
WBC: 9.1 10*3/uL (ref 4.0–10.5)
nRBC: 0 % (ref 0.0–0.2)

## 2022-07-05 LAB — MAGNESIUM: Magnesium: 2 mg/dL (ref 1.7–2.4)

## 2022-07-05 LAB — GLUCOSE, CAPILLARY
Glucose-Capillary: 129 mg/dL — ABNORMAL HIGH (ref 70–99)
Glucose-Capillary: 150 mg/dL — ABNORMAL HIGH (ref 70–99)
Glucose-Capillary: 142 mg/dL — ABNORMAL HIGH (ref 70–99)
Glucose-Capillary: 147 mg/dL — ABNORMAL HIGH (ref 70–99)

## 2022-07-05 MED ORDER — TRAMADOL HCL 50 MG PO TABS
50.0000 mg | ORAL_TABLET | Freq: Three times a day (TID) | ORAL | Status: DC | PRN
  Administered 2022-07-06 – 2022-07-10 (×4): 50 mg via ORAL
  Filled 2022-07-05 (×5): qty 1

## 2022-07-05 MED ORDER — BISACODYL 10 MG RE SUPP
10.0000 mg | Freq: Once | RECTAL | Status: AC
  Administered 2022-07-05: 10 mg via RECTAL
  Filled 2022-07-05: qty 1

## 2022-07-05 MED ORDER — PANTOPRAZOLE SODIUM 40 MG PO TBEC
40.0000 mg | DELAYED_RELEASE_TABLET | Freq: Every day | ORAL | Status: DC
  Administered 2022-07-05 – 2022-07-10 (×6): 40 mg via ORAL
  Filled 2022-07-05 (×6): qty 1

## 2022-07-05 NOTE — Progress Notes (Signed)
Through out shift, patient's family is complaining that patient is having a reacting to medication.  This nurse assessment is more of restlessness, agitation, and some possible pain.  Family want patient to get pain meds but then complain that they think the pain meds are having patient to behave differently, "she's resting too much".  Then the complaint becomes "she's moving around a lot and not sleeping ".   Patient is alert and oriented to self only.

## 2022-07-05 NOTE — Progress Notes (Signed)
PROGRESS NOTE    [name]  HYI:[id number] DOB: 1928/12/17 DOA: 06/30/2022 PCP: Seward Carol, MD   Brief Narrative:  88 year old female with medical history of hypertension, diabetes mellitus type 2, stage IV CKD, dementia, diastolic CHF, GERD who was on vacation with her daughter in [place] when she suffered a mechanical fall.  Patient went to balcony when she missed a step and fell down.  Her daughter found her on her back with right leg shortened and on outwards.  She was transferred to hospital.  Also surgeons at [place] told her that was a complex fracture and did not think it was safe to travel home.  Her daughter medi vaced her out today and travel time was 3 hours and 6 minutes. There is no paper chart.  During hospitalization she was found to have right distal femur periprosthetic fracture, acute anemia and off-and-on agitation/hallucination. Underwent OR today, Renal func is improving.    Assessment & Plan:  Principal Problem:   Closed fracture of right distal femur ([place]) Active Problems:   Acute respiratory failure with hypoxia (HCC)   Acute renal failure superimposed on stage 4 chronic kidney disease (HCC)   Acute on chronic anemia   Chronic diastolic CHF (congestive heart failure) (HCC)   Type 2 diabetes mellitus with complication, without long-term current use of insulin (HCC)   Hypertension   GERD (gastroesophageal reflux disease)   history of seizures   Dementia ([place])       Right distal femur periprosthetic fracture Mechanical fall -Sustained fall in [place].  S/p surgical repair by Dr. Marcelino Scot on 07/03/2022.  Pain uncontrolled.  She is on as needed medications.  Niece was advised to let the nurses know anytime patient is in pain.   -CT head negative   Acute hypoxemic respiratory failure: - Elevated D-dimer, likely from atelectasis.  Low suspicion for PE.  Currently saturating 100% on 2 L.  Perhaps she does not need that.  Aggressive use of incentive  spirometer and flutter valve.  Procalcitonin is negative, BNP 309   Iron deficiency anemia/acute on chronic anemia -Baseline hemoglobin 9, admission hemoglobin less than 7.  Status post transfusion, hemoglobin stable ever since.   Acute kidney injury on CKD stage III -Patient's baseline creatinine 1.47 as of 03/15/2022.  Admission creatinine 4.45, creatinine continues to improve with IV fluids.  This morning 1.79 yesterday and 1.89 today.  Nephrology signed off on 07/04/2022.  UTI: Patient was started on Rocephin for UTI.  Urine culture is growing E. coli and Klebsiella oxytoca.  Will complete 3 days of Rocephin.   History of seizures: -Has been seizure-free for many years.  Continue Keppra.    Dementia without behavior disturbance -Continue Remeron, Seroquel nightly   Diabetes mellitus type 2 -Hemoglobin A1c 6.4 -Sliding scale and Accu-Cheks  Hypomagnesemia: Resolved.  DVT prophylaxis: Subcu heparin Code Status: Full code Family Communication: Niece and daughter at the bedside. Status is: Inpatient Remains inpatient appropriate because: Patient needs SNF.  Patient's family wants patient to go to CIR, if not then home but they do not want to take patient home for next 4 to 5 days and want her to get daily physical therapy here.  Nutritional status    Signs/Symptoms: estimated needs  Interventions: Nepro shake, MVI  Body mass index is 25.08 kg/m.         Subjective:   Patient seen and examined.  Patient was sleepy but easily arousable.  She was oriented to self only, that  is her baseline.  Daughter and niece at the bedside.  Lengthy discussion with them.  They are telling me that patient has not had any bowel movement in the last 7 days.  Also reportedly patient was restless overnight.  Family wants to drive the care for the patient.  They do not want to send this patient to SNF and they are demanding that patient stay here until she is able to go  home.  Examination:  General exam: Appears calm and comfortable  Respiratory system: Clear to auscultation. Respiratory effort normal. Cardiovascular system: S1 & S2 heard, RRR. No JVD, murmurs, rubs, gallops or clicks. No pedal edema. Gastrointestinal system: Abdomen is nondistended, soft and nontender. No organomegaly or masses felt. Normal bowel sounds heard. Central nervous system: Alert and oriented x 1. No focal neurological deficits. Skin: No rashes, lesions or ulcers.     Objective: Vitals:   07/04/22 1429 07/04/22 1955 07/05/22 0627 07/05/22 0852  BP: (!) 132/55 (!) 148/61 (!) 151/59 (!) 140/55  Pulse: 72 79 94 74  Resp: '20 18 20 13  '$ Temp: 99.1 F (37.3 C)   97.6 F (36.4 C)  TempSrc: Oral   Oral  SpO2: 94% 96% 96% 97%  Weight:      Height:        Intake/Output Summary (Last 24 hours) at 07/05/2022 0937 Last data filed at 07/05/2022 0655 Gross per 24 hour  Intake 683.17 ml  Output 650 ml  Net 33.17 ml    Filed Weights   06/30/22 1700 07/01/22 2215 07/02/22 0440  Weight: 59.1 kg 60.2 kg 60.2 kg     Data Reviewed:   CBC: Recent Labs  Lab 06/30/22 1906 06/30/22 2116 07/01/22 1204 07/02/22 0357 07/03/22 0225 07/04/22 0324 07/05/22 0413  WBC 10.3   < > 8.1 9.3 7.4 9.1 9.1  NEUTROABS 8.3*  --   --   --   --   --   --   HGB 8.3*   < > 6.9* 9.0* 9.6* 10.3* 9.4*  HCT 25.4*   < > 22.2* 25.4* 28.9* 30.2* 28.6*  MCV 90.7   < > 93.7 84.9 87.6 87.0 88.5  PLT 204   < > 188 198 206 214 223   < > = values in this interval not displayed.    Basic Metabolic Panel: Recent Labs  Lab 07/01/22 0122 07/02/22 0357 07/03/22 0225 07/04/22 0324 07/05/22 0413  NA 136 138 136 139 136  K 4.9 4.5 3.9 4.3 4.1  CL 107 107 107 102 105  CO2 18* 20* 20* 24 21*  GLUCOSE 117* 132* 160* 171* 123*  BUN 49* 51* 40* 30* 33*  CREATININE 4.69* 3.76* 2.52* 1.79* 1.89*  CALCIUM 8.1* 8.6* 8.6* 9.9 9.2  MG  --   --  1.5* 2.6* 2.0    GFR: Estimated Creatinine Clearance: 16.2  mL/min (A) (by C-G formula based on SCr of 1.89 mg/dL (H)). Liver Function Tests: Recent Labs  Lab 06/30/22 1906 07/02/22 0357  AST 29 26  ALT 18 16  ALKPHOS 147* 148*  BILITOT 0.4 0.9  PROT 6.1* 6.1*  ALBUMIN 2.5* 2.4*    No results for input(s): "LIPASE", "AMYLASE" in the last 168 hours. No results for input(s): "AMMONIA" in the last 168 hours. Coagulation Profile: Recent Labs  Lab 06/30/22 1906  INR 1.3*    Cardiac Enzymes: Recent Labs  Lab 07/02/22 0357  CKTOTAL 188    BNP (last 3 results) No results for input(s): "PROBNP" in the  last 8760 hours. HbA1C: No results for input(s): "HGBA1C" in the last 72 hours. CBG: Recent Labs  Lab 07/04/22 0759 07/04/22 1218 07/04/22 1629 07/04/22 1952 07/05/22 0834  GLUCAP 158* 139* 173* 164* 129*    Lipid Profile: No results for input(s): "CHOL", "HDL", "LDLCALC", "TRIG", "CHOLHDL", "LDLDIRECT" in the last 72 hours. Thyroid Function Tests: No results for input(s): "TSH", "T4TOTAL", "FREET4", "T3FREE", "THYROIDAB" in the last 72 hours. Anemia Panel: No results for input(s): "VITAMINB12", "FOLATE", "FERRITIN", "TIBC", "IRON", "RETICCTPCT" in the last 72 hours.  Sepsis Labs: Recent Labs  Lab 07/03/22 0225  PROCALCITON <0.10     Recent Results (from the past 240 hour(s))  Urine Culture     Status: Abnormal   Collection Time: 07/02/22 10:15 AM   Specimen: Urine, Clean Catch  Result Value Ref Range Status   Specimen Description URINE, CLEAN CATCH  Final   Special Requests   Final    NONE Performed at [facility], [address], [place], [place] [address]    Culture (A)  Final    >=100,000 COLONIES/mL ESCHERICHIA COLI >=100,000 COLONIES/mL KLEBSIELLA OXYTOCA    Report Status 07/04/2022 FINAL  Final   Organism ID, Bacteria ESCHERICHIA COLI (A)  Final   Organism ID, Bacteria KLEBSIELLA OXYTOCA (A)  Final      Susceptibility   Escherichia coli - MIC*    AMPICILLIN 8 SENSITIVE Sensitive     CEFAZOLIN  <=4 SENSITIVE Sensitive     CEFEPIME <=0.12 SENSITIVE Sensitive     CEFTRIAXONE <=0.25 SENSITIVE Sensitive     CIPROFLOXACIN <=0.25 SENSITIVE Sensitive     GENTAMICIN <=1 SENSITIVE Sensitive     IMIPENEM <=0.25 SENSITIVE Sensitive     NITROFURANTOIN <=16 SENSITIVE Sensitive     TRIMETH/SULFA <=20 SENSITIVE Sensitive     AMPICILLIN/SULBACTAM <=2 SENSITIVE Sensitive     PIP/TAZO <=4 SENSITIVE Sensitive     * >=100,000 COLONIES/mL ESCHERICHIA COLI   Klebsiella oxytoca - MIC*    AMPICILLIN RESISTANT Resistant     CEFAZOLIN <=4 SENSITIVE Sensitive     CEFEPIME <=0.12 SENSITIVE Sensitive     CEFTRIAXONE <=0.25 SENSITIVE Sensitive     CIPROFLOXACIN <=0.25 SENSITIVE Sensitive     GENTAMICIN <=1 SENSITIVE Sensitive     IMIPENEM <=0.25 SENSITIVE Sensitive     NITROFURANTOIN 32 SENSITIVE Sensitive     TRIMETH/SULFA <=20 SENSITIVE Sensitive     AMPICILLIN/SULBACTAM 4 SENSITIVE Sensitive     PIP/TAZO <=4 SENSITIVE Sensitive     * >=100,000 COLONIES/mL KLEBSIELLA OXYTOCA         Radiology Studies: DG Knee Right Port  Result Date: 07/03/2022 CLINICAL DATA:  Closed fracture right distal femur status post ORIF. EXAM: PORTABLE RIGHT KNEE - 1-2 VIEW COMPARISON:  Right knee radiographs 06/30/2022 FINDINGS: Interval lateral plate and screw fixation of the previously seen markedly comminuted distal fibular diaphyseal and metaphyseal fracture. There is again mild diastasis of multiple distal fibular fracture fragments, however the fixation hardware screws are distal and proximal to this, and no hardware failure is seen. Redemonstration of total right knee arthroplasty hardware. Expected postoperative air within the lateral thigh and knee joint. IMPRESSION: Interval lateral plate and screw fixation of the previously seen markedly comminuted distal fibular diaphyseal and metaphyseal fracture. No evidence of hardware failure. Electronically Signed   By: Yvonne Kendall M.D.   On: 07/03/2022 12:46   DG FEMUR,  MIN 2 VIEWS RIGHT  Result Date: 07/03/2022 CLINICAL DATA:  ORIF right femur. Elective surgery. Intraoperative fluoroscopy. EXAM: RIGHT  FEMUR 2 VIEWS COMPARISON:  Right knee radiographs 06/30/2022, right hip radiographs 06/30/2022 FINDINGS: Images were performed intraoperatively without the presence of a radiologist. Interval lateral plate and screw fixation of the distal fibula, fixating the previously seen comminuted and displaced distal femoral diaphyseal metaphyseal fracture. Improved alignment. Redemonstration of total right knee arthroplasty hardware. Total fluoroscopy images: 6 Total fluoroscopy time: 48 seconds Total dose: Radiation Exposure Index (as provided by the fluoroscopic device): 5.51 mGy air Kerma Please see intraoperative findings for further detail. IMPRESSION: Intraoperative fluoroscopic guidance for ORIF of the distal right femur. Electronically Signed   By: Yvonne Kendall M.D.   On: 07/03/2022 11:26   DG C-Arm 1-60 Min-No Report  Result Date: 07/03/2022 Fluoroscopy was utilized by the requesting physician.  No radiographic interpretation.        Scheduled Meds:  sodium chloride   Intravenous Once   allopurinol  100 mg Oral BID   amLODipine  10 mg Oral Daily   bisacodyl  10 mg Rectal Once   Chlorhexidine Gluconate Cloth  6 each Topical Daily   docusate sodium  100 mg Oral BID   feeding supplement (NEPRO CARB STEADY)  237 mL Oral BID BM   heparin injection (subcutaneous)  5,000 Units Subcutaneous Q8H   hydrALAZINE  25 mg Oral Q8H   insulin aspart  0-9 Units Subcutaneous TID WC   levETIRAcetam  500 mg Oral q AM   metoprolol tartrate  100 mg Oral BID   mirtazapine  15 mg Oral QHS   multivitamin  1 tablet Oral QHS   pantoprazole (PROTONIX) IV  40 mg Intravenous Q24H   QUEtiapine  25 mg Oral QHS   traMADol  50 mg Oral Q12H   Continuous Infusions:  cefTRIAXone (ROCEPHIN)  IV 1 g (07/04/22 1252)   methocarbamol (ROBAXIN) IV Stopped (07/04/22 0431)     LOS: 5 days    Time spent= 35 mins    Darliss Cheney, MD [facility]  If 7PM-7AM, please contact night-coverage  07/05/2022, 9:37 AM

## 2022-07-05 NOTE — Progress Notes (Signed)
Physical Therapy Treatment Patient Details Name: [name] MRN: [id number] DOB: 1929/05/29 Today's Date: 07/05/2022   History of Present Illness Pt is a 88 y/o F admitted on 06/30/22. Pt was on vacation with her daughter in [place] when she fell & was taken to hospital. Her daughter [name] vaced her out. Pt found to have R complex distal femur fx. Pt underwent ORIF of distal femur fx by Dr. Marcelino Scot on 07/03/22. PMH: HTN, DM2, CKD Stage IV, dementia, diastolic CHF, GERD, glaucoma.    PT Comments    Pt received in supine after premedication, A&O to self only, daughter present and supportive. Pt noted to be soiled and participating with bed mobility and hygiene care tasks with max multimodal cues and heavy encouragement. Pt needing totalA +2-3 for rolling to L/R sides and for attempts at transfer from supine (elevated HOB) to long sitting, however due to pain/fatigue after bowel movement, pt unable to tolerate long sitting or transfer to edge of bed. Pt and daughter given supine LE HEP handout and encouraged to attempt TID as able for LE strengthening, pt participates well with LLE ROM with multimodal cues but needs dense cues and AAROM for RLE movements in supine and with limited ROM due to pain. Pt becoming more agitated with attempts at seated posture so defer for pt safety. Pt very fatigued and falling asleep at end of session and per her daughter this was first BM in multiple days. Pt continues to benefit from PT services to progress toward functional mobility goals.   Recommendations for follow up therapy are one component of a multi-disciplinary discharge planning process, led by the attending physician.  Recommendations may be updated based on patient status, additional functional criteria and insurance authorization.  Follow Up Recommendations  Skilled nursing-short term rehab (<3 hours/day) (daughter likely to refuse SNF) Can patient physically be transported by private vehicle: No    Assistance Recommended at Discharge Frequent or constant Supervision/Assistance  Patient can return home with the following Two people to help with walking and/or transfers;Two people to help with bathing/dressing/bathroom;Help with stairs or ramp for entrance;Direct supervision/assist for medications management;Assist for transportation;Assistance with feeding;Direct supervision/assist for financial management;Assistance with Education officer, environmental (measurements PT);Wheelchair cushion (measurements PT);Rolling walker (2 wheels);BSC/3in1;Hospital bed;Other (comment) (mechanical lift and sling)    Recommendations for Other Services       Precautions / Restrictions Precautions Precautions: Fall Precaution Comments: dementia, poor pain tolerance Required Braces or Orthoses:  (RLE foam board when in bed) Restrictions Weight Bearing Restrictions: Yes RLE Weight Bearing: Non weight bearing     Mobility  Bed Mobility Overal bed mobility: Needs Assistance Bed Mobility: Supine to Sit, Sit to Supine, Rolling Rolling: Total assist, +2 for physical assistance   Supine to sit: Total assist, +2 for physical assistance, HOB elevated Sit to supine: Total assist, +2 for physical assistance, HOB elevated   General bed mobility comments: pt soiled upon PTA arrival to room, mobility specialist also present to provide +2 assist and RN present due to hygiene/foley cath needs during session. Pt totalA to roll to L then to R sides and for partial transition to long sitting in bed x2 attempts. Pt pain/fatigue limiting and pt falling asleep in supine after attempt to long sit. Pt bed adjusted to chair posture and trended forward so pt can be more upright for dinner meal, daughter will notify staff when pt done eating to return to more supine posture.    Transfers Overall transfer  level:  (Not assessed due to high pain level, pt does not tolerate transfer to edge of bed  today)                        Balance Overall balance assessment: Needs assistance Sitting-balance support: Feet supported, Bilateral upper extremity supported Sitting balance-Leahy Scale: Zero Sitting balance - Comments: decreased willingness to assist with long sitting due to pain/decreased awareness of situation with cognitive deficit. with bed in chair posture, pt leaning toward L side so pillow placed under her L elbow and inside L bed rail to promote neutral/upright posture. Postural control: Left lateral lean   Standing balance-Leahy Scale:  (Standing balance not assessed)                              Cognition Arousal/Alertness: Suspect due to medications (Initially alert but more lethargic toward end of session after peri-care completed; pt premedicated so anticipate this was due to pain meds) Behavior During Therapy: Anxious, Restless Overall Cognitive Status: History of cognitive impairments - at baseline Area of Impairment: Following commands, Awareness, Safety/judgement, Problem solving, Orientation, Memory                 Orientation Level: Disoriented to, Place, Time, Situation   Memory: Decreased recall of precautions, Decreased short-term memory Following Commands: Follows one step commands inconsistently Safety/Judgement: Decreased awareness of safety, Decreased awareness of deficits Awareness: Intellectual Problem Solving: Requires verbal cues, Requires tactile cues, Slow processing, Decreased initiation, Difficulty sequencing General Comments: Pt with hx of dementia, sometimes will state her name and perseverates on DOB when asked other questions. Pt did better with errorless learning strategy; premedicated but still very limited by pain. Daughter [name] present and encouraging her.        Exercises Other Exercises Other Exercises: supine BLE AAROM: LAQ, hip AB/adduction, ankle pumps, heel slides (in less painful range on her R side)  x5-10 reps ea Other Exercises: supine BUE AAROM: shoulder flexion, elbow flex/ext x 5 reps ea Other Exercises: Attempted to have pt assist with pulling to long sitting with bed side rails however pt more lethargic at that time and unable to assist.    General Comments General comments (skin integrity, edema, etc.): SpO2 and HR WFL on 3L O2 [place], tele leads replaced that were not sticking well      Pertinent Vitals/Pain Pain Assessment Pain Assessment: PAINAD Breathing: occasional labored breathing, short period of hyperventilation Negative Vocalization: repeated troubled calling out, loud moaning/groaning, crying Facial Expression: facial grimacing Body Language: tense, distressed pacing, fidgeting Consolability: distracted or reassured by voice/touch PAINAD Score: 7 Pain Location: distal, lateral R thigh Pain Descriptors / Indicators: Aching, Crying, Grimacing, Discomfort, Constant, Moaning, Operative site guarding Pain Intervention(s): Limited activity within patient's tolerance, Monitored during session, Premedicated before session, Repositioned, Ice applied    Home Living                          Prior Function            PT Goals (current goals can now be found in the care plan section) Acute Rehab PT Goals Patient Stated Goal: Per daughter [name], decreased pain, get better/return to PLOF, go home PT Goal Formulation: With patient/family Time For Goal Achievement: 07/18/22 Progress towards PT goals: Progressing toward goals (slowly due to pain/confusion)    Frequency    Min 3X/week  PT Plan Current plan remains appropriate    Co-evaluation              AM-PAC PT "6 Clicks" Mobility   Outcome Measure  Help needed turning from your back to your side while in a flat bed without using bedrails?: Total Help needed moving from lying on your back to sitting on the side of a flat bed without using bedrails?: Total Help needed moving to and from a  bed to a chair (including a wheelchair)?: Total Help needed standing up from a chair using your arms (e.g., wheelchair or bedside chair)?: Total Help needed to walk in hospital room?: Total Help needed climbing 3-5 steps with a railing? : Total 6 Click Score: 6    End of Session Equipment Utilized During Treatment: Oxygen Activity Tolerance: Patient limited by lethargy;Patient limited by pain Patient left: in bed;with call bell/phone within reach;with family/visitor present;with SCD's reapplied (Pt bed in chair posture, rails up per air bed, daughter present, ice to her R hip, warm blanket) Nurse Communication: Mobility status;Need for lift equipment;Other (comment) (hoyer for OOB) PT Visit Diagnosis: Muscle weakness (generalized) (M62.81);Pain;Other abnormalities of gait and mobility (R26.89);Difficulty in walking, not elsewhere classified (R26.2) Pain - Right/Left: Right Pain - part of body: Leg     Time: 5537-4827 PT Time Calculation (min) (ACUTE ONLY): 40 min  Charges:  $Therapeutic Exercise: 8-22 mins $Therapeutic Activity: 23-37 mins                     Karine Garn P., PTA Acute Rehabilitation Services Secure Chat Preferred 9a-5:30pm Office: [place] 07/05/2022, 6:07 PM

## 2022-07-05 NOTE — Progress Notes (Signed)
Orthopaedic Trauma Service Progress Note  Patient ID: [name] MRN: [id number] DOB/AGE: 04/01/1929 88 y.o.  Subjective:  Doing a little better this afternoon Daughter at bedside.  Thinks her pain has been better today  Needs to have BM     ROS As above  Objective:   VITALS:   Vitals:   07/04/22 1955 07/05/22 0627 07/05/22 0852 07/05/22 1142  BP: (!) 148/61 (!) 151/59 (!) 140/55 (!) 152/62  Pulse: 79 94 74 72  Resp: '18 20 13 18  '$ Temp:   97.6 F (36.4 C) 98 F (36.7 C)  TempSrc:   Oral   SpO2: 96% 96% 97% 99%  Weight:      Height:        Estimated body mass index is 25.08 kg/m as calculated from the following:   Height as of this encounter: '5\' 1"'$  (1.549 m).   Weight as of this encounter: 60.2 kg.   Intake/Output      12/06 0701 12/07 0700 12/07 0701 12/08 0700   P.O. 480    I.V. (mL/kg) 103.2 (1.7)    Other     IV Piggyback 100    Total Intake(mL/kg) 683.2 (11.3)    Urine (mL/kg/hr) 650 (0.4)    Blood     Total Output 650    Net +33.2           LABS  Results for orders placed or performed during the hospital encounter of 06/30/22 (from the past 24 hour(s))  Glucose, capillary     Status: Abnormal   Collection Time: 07/04/22  4:29 PM  Result Value Ref Range   Glucose-Capillary 173 (H) 70 - 99 mg/dL  Glucose, capillary     Status: Abnormal   Collection Time: 07/04/22  7:52 PM  Result Value Ref Range   Glucose-Capillary 164 (H) 70 - 99 mg/dL  Basic metabolic panel     Status: Abnormal   Collection Time: 07/05/22  4:13 AM  Result Value Ref Range   Sodium 136 135 - 145 mmol/L   Potassium 4.1 3.5 - 5.1 mmol/L   Chloride 105 98 - 111 mmol/L   CO2 21 (L) 22 - 32 mmol/L   Glucose, Bld 123 (H) 70 - 99 mg/dL   BUN 33 (H) 8 - 23 mg/dL   Creatinine, Ser 1.89 (H) 0.44 - 1.00 mg/dL   Calcium 9.2 8.9 - 10.3 mg/dL   GFR, Estimated 25 (L) >60 mL/min   Anion gap 10 5 - 15  CBC      Status: Abnormal   Collection Time: 07/05/22  4:13 AM  Result Value Ref Range   WBC 9.1 4.0 - 10.5 K/uL   RBC 3.23 (L) 3.87 - 5.11 MIL/uL   Hemoglobin 9.4 (L) 12.0 - 15.0 g/dL   HCT 28.6 (L) 36.0 - 46.0 %   MCV 88.5 80.0 - 100.0 fL   MCH 29.1 26.0 - 34.0 pg   MCHC 32.9 30.0 - 36.0 g/dL   RDW 16.6 (H) 11.5 - 15.5 %   Platelets 223 150 - 400 K/uL   nRBC 0.0 0.0 - 0.2 %  Magnesium     Status: None   Collection Time: 07/05/22  4:13 AM  Result Value Ref Range   Magnesium 2.0 1.7 - 2.4 mg/dL  Glucose, capillary  Status: Abnormal   Collection Time: 07/05/22  8:34 AM  Result Value Ref Range   Glucose-Capillary 129 (H) 70 - 99 mg/dL  Glucose, capillary     Status: Abnormal   Collection Time: 07/05/22 11:39 AM  Result Value Ref Range   Glucose-Capillary 150 (H) 70 - 99 mg/dL     PHYSICAL EXAM:   Gen: lying down in bed, looks more comfortable  Ext:       Right Lower Extremity              Dressing R thigh clean, dry and intact             Ext warm              + DP pulse             No DCT             Compartments soft              Motor and sensory functions grossly intact    Assessment/Plan: 2 Days Post-Op   Principal Problem:   Closed fracture of right distal femur (HCC) Active Problems:   GERD (gastroesophageal reflux disease)   Hypertension   Dementia (HCC)   Type 2 diabetes mellitus with complication, without long-term current use of insulin (HCC)   Acute renal failure superimposed on stage 4 chronic kidney disease (HCC)   Chronic diastolic CHF (congestive heart failure) (HCC)   history of seizures   Acute respiratory failure with hypoxia (HCC)   Acute on chronic anemia   Anti-infectives (From admission, onward)    Start     Dose/Rate Route Frequency Ordered Stop   07/04/22 1200  cefTRIAXone (ROCEPHIN) 1 g in sodium chloride 0.9 % 100 mL IVPB        1 g 200 mL/hr over 30 Minutes Intravenous Every 24 hours 07/02/22 1335 07/07/22 1159   07/03/22 0726   sodium chloride 0.9 % with cefTRIAXone (ROCEPHIN) ADS Med       Note to Pharmacy: Cameron Sprang M: cabinet override      07/03/22 0726 07/03/22 0838   07/03/22 0700  ceFAZolin (ANCEF) IVPB 2g/100 mL premix  Status:  Discontinued        2 g 200 mL/hr over 30 Minutes Intravenous On call to O.R. 07/02/22 1048 07/02/22 1104   07/03/22 0700  cefTRIAXone (ROCEPHIN) 2 g in sodium chloride 0.9 % 100 mL IVPB        2 g 200 mL/hr over 30 Minutes Intravenous To Surgery 07/02/22 1335 07/03/22 0835   07/02/22 1200  cefTRIAXone (ROCEPHIN) 1 g in sodium chloride 0.9 % 100 mL IVPB        1 g 200 mL/hr over 30 Minutes Intravenous Every 24 hours 07/02/22 1056 07/02/22 1320     .  POD/HD#: 2  88 y/o female with ground-level fall right periprosthetic distal femur fracture   -Ground-level fall   -Right periprosthetic distal femur fracture s/p ORIF              Nonweightbearing right leg for 6 weeks             Restricted range of motion right knee             Dressing changes as needed starting tomorrow              Ice and elevate for swelling control and pain  Float heels off bed               PT and OT evaluations   PT- please teach HEP for R knee ROM- AROM, PROM. No ROM restrictions.  Quad sets, SLR, LAQ, SAQ, heel slides, stretching   Ankle theraband program, heel cord stretching, toe towel curls, etc   No pillows under bend of knee when at rest, ok to place under heel to help work on extension. Can also use zero knee bone foam if available     - Pain management:             Multimodal               change all narcotics to PRN     Continue scheduled tylenol    - ABL anemia/Hemodynamics             Stable                 Did receive TXA postop   - Medical issues              Per medicine and nephrology      Renal function improved   Bowel regimen   - DVT/PE prophylaxis:             Continue sq heparin              Likely transition to eliquis in next day or two   - ID:              On Rocephin for UTI.  This is adequate for perioperative coverage - Metabolic Bone Disease:             Fragility fracture                       Vitamin d levels are ok    - Activity:             As above   - Impediments to fracture healing:             Age/fragility fracture/bone quality   - Dispo:             Ortho issues addressed             PT/OT evals              SNF vs CIR                           Optimize pain control to maximize her ability to participate with therapies  Jari Pigg, PA-C [phone] (C) 07/05/2022, 1:01 PM  [facility] [place] [address] [phone] Jenetta [phone] (F)    After 5pm and on the weekends please log on to Amion, go to orthopaedics and the look under the [facility] Call for the provider(s) on call. You can also call our office at [phone] and then follow the prompts to be connected to the call team.  Patient ID: Norva Pavlov, female   DOB: Jul 29, 1929, 88 y.o.   MRN: [id number]

## 2022-07-06 DIAGNOSIS — S72491A Other fracture of lower end of right femur, initial encounter for closed fracture: Secondary | ICD-10-CM | POA: Diagnosis not present

## 2022-07-06 LAB — GLUCOSE, CAPILLARY
Glucose-Capillary: 149 mg/dL — ABNORMAL HIGH (ref 70–99)
Glucose-Capillary: 157 mg/dL — ABNORMAL HIGH (ref 70–99)
Glucose-Capillary: 135 mg/dL — ABNORMAL HIGH (ref 70–99)
Glucose-Capillary: 134 mg/dL — ABNORMAL HIGH (ref 70–99)

## 2022-07-06 LAB — CBC
HCT: 28.3 % — ABNORMAL LOW (ref 36.0–46.0)
Hemoglobin: 9.4 g/dL — ABNORMAL LOW (ref 12.0–15.0)
MCH: 29.9 pg (ref 26.0–34.0)
MCHC: 33.2 g/dL (ref 30.0–36.0)
MCV: 90.1 fL (ref 80.0–100.0)
Platelets: 233 10*3/uL (ref 150–400)
RBC: 3.14 MIL/uL — ABNORMAL LOW (ref 3.87–5.11)
RDW: 16.5 % — ABNORMAL HIGH (ref 11.5–15.5)
WBC: 8.6 10*3/uL (ref 4.0–10.5)
nRBC: 0.2 % (ref 0.0–0.2)

## 2022-07-06 LAB — BASIC METABOLIC PANEL
Anion gap: 10 (ref 5–15)
BUN: 34 mg/dL — ABNORMAL HIGH (ref 8–23)
CO2: 22 mmol/L (ref 22–32)
Calcium: 8.8 mg/dL — ABNORMAL LOW (ref 8.9–10.3)
Chloride: 102 mmol/L (ref 98–111)
Creatinine, Ser: 1.88 mg/dL — ABNORMAL HIGH (ref 0.44–1.00)
GFR, Estimated: 25 mL/min — ABNORMAL LOW (ref >60)
Glucose, Bld: 136 mg/dL — ABNORMAL HIGH (ref 70–99)
Potassium: 4 mmol/L (ref 3.5–5.1)
Sodium: 134 mmol/L — ABNORMAL LOW (ref 135–145)

## 2022-07-06 LAB — MAGNESIUM: Magnesium: 1.8 mg/dL (ref 1.7–2.4)

## 2022-07-06 MED ORDER — SODIUM CHLORIDE 0.9 % IV SOLN
1.0000 g | Freq: Once | INTRAVENOUS | Status: DC
  Filled 2022-07-06: qty 10

## 2022-07-06 MED ORDER — ACETAMINOPHEN 325 MG PO TABS
650.0000 mg | ORAL_TABLET | Freq: Four times a day (QID) | ORAL | Status: DC
  Administered 2022-07-06 – 2022-07-11 (×19): 650 mg via ORAL
  Filled 2022-07-06 (×20): qty 2

## 2022-07-06 MED ORDER — SODIUM CHLORIDE 0.9 % IV SOLN
250.0000 mg | Freq: Every day | INTRAVENOUS | Status: AC
  Administered 2022-07-06 – 2022-07-08 (×3): 250 mg via INTRAVENOUS
  Filled 2022-07-06 (×3): qty 20

## 2022-07-06 NOTE — TOC Progression Note (Signed)
Transition of Care [facility]) - Progression Note    Patient Details  Name: [name] MRN: [id number] Date of Birth: 16-Jun-1929  [facility]) CM/SW Contact  Joanne Chars, LCSW Phone Number: 07/06/2022, 12:45 PM  Clinical Narrative:   TOC continues to follow for DC needs.  Pt daughter declines SNF referral, pt not a candidate for CIR at this time.  Team is aware.     Expected Discharge Plan:  (TBD) Barriers to Discharge: Continued Medical Work up  Expected Discharge Plan and Services Expected Discharge Plan:  (TBD) In-house Referral: Clinical Social Work   Post Acute Care Choice: IP Rehab Living arrangements for the past 2 months: Single Family Home                                       Social Determinants of Health (SDOH) Interventions    Readmission Risk Interventions     No data to display

## 2022-07-06 NOTE — Plan of Care (Signed)
  Problem: Clinical Measurements: Goal: Diagnostic test results will improve Outcome: Progressing   Problem: Elimination: Goal: Will not experience complications related to bowel motility Outcome: Progressing Goal: Will not experience complications related to urinary retention Outcome: Progressing

## 2022-07-06 NOTE — Progress Notes (Signed)
PROGRESS NOTE    [name]  HWE:[id number] DOB: Jul 25, 1929 DOA: 06/30/2022 PCP: Seward Carol, MD   Brief Narrative:  88 year old female with medical history of hypertension, diabetes mellitus type 2, stage IV CKD, dementia, diastolic CHF, GERD who was on vacation with her daughter in [place] when she suffered a mechanical fall.  Patient went to balcony when she missed a step and fell down.  Her daughter found her on her back with right leg shortened and on outwards.  She was transferred to hospital.  Also surgeons at [place] told her that was a complex fracture and did not think it was safe to travel home.  Her daughter medi vaced her out today and travel time was 3 hours and 6 minutes. There is no paper chart.  During hospitalization she was found to have right distal femur periprosthetic fracture, acute anemia and off-and-on agitation/hallucination. Underwent OR today, Renal func is improving.    Assessment & Plan:  Principal Problem:   Closed fracture of right distal femur ([place]) Active Problems:   Acute respiratory failure with hypoxia (HCC)   Acute renal failure superimposed on stage 4 chronic kidney disease (HCC)   Acute on chronic anemia   Chronic diastolic CHF (congestive heart failure) (HCC)   Type 2 diabetes mellitus with complication, without long-term current use of insulin (HCC)   Hypertension   GERD (gastroesophageal reflux disease)   history of seizures   Dementia ([place])       Right distal femur periprosthetic fracture Mechanical fall -Sustained fall in [place].  S/p surgical repair by Dr. Marcelino Scot on 07/03/2022.  Pain is now controlled, she appears comfortable. -CT head negative   Acute hypoxemic respiratory failure: - Elevated D-dimer, likely from atelectasis.  Low suspicion for PE.  Currently saturating 100% on 2 L.  Perhaps she does not need that.  Aggressive use of incentive spirometer and flutter valve.  Procalcitonin is negative, BNP 309   Iron  deficiency anemia/acute on chronic anemia -Baseline hemoglobin 9, admission hemoglobin less than 7.  Status post transfusion, hemoglobin stable ever since.   Acute kidney injury on CKD stage III -Patient's baseline creatinine 1.47 as of 03/15/2022.  Admission creatinine 4.45, creatinine continues to improve with IV fluids.  This morning 1.79 yesterday and 1.88 today.  Nephrology signed off on 07/04/2022.  UTI: Patient was started on Rocephin for UTI.  Urine culture is growing E. coli and Klebsiella oxytoca.  Will complete 5 days of Rocephin.   History of seizures: -Has been seizure-free for many years.  Continue Keppra.    Dementia without behavior disturbance -Continue Remeron, Seroquel nightly   Diabetes mellitus type 2 -Hemoglobin A1c 6.4.  Blood sugar controlled. -Sliding scale and Accu-Cheks  Hypomagnesemia: Resolved.  DVT prophylaxis: Subcu heparin Code Status: Full code Family Communication: None at bedside today. Status is: Inpatient Remains inpatient appropriate because: Patient needs SNF.  Patient's family wants patient to go to [facility], if not then home but they do not want to take patient home for next 4 to 5 days and want her to get daily physical therapy here.  Nutritional status    Signs/Symptoms: estimated needs  Interventions: Nepro shake, MVI  Body mass index is 25.08 kg/m.         Subjective:   Patient seen and examined.  She was alert and oriented to self, this is her baseline.  She was comfortable.  Examination:  General exam: Appears calm and comfortable  Respiratory system: Clear to auscultation.  Respiratory effort normal. Cardiovascular system: S1 & S2 heard, RRR. No JVD, murmurs, rubs, gallops or clicks. No pedal edema. Gastrointestinal system: Abdomen is nondistended, soft and nontender. No organomegaly or masses felt. Normal bowel sounds heard. Central nervous system: Alert and oriented. No focal neurological deficits. Extremities: Symmetric  5 x 5 power. Skin: No rashes, lesions or ulcers.     Objective: Vitals:   07/05/22 2242 07/06/22 0524 07/06/22 0728 07/06/22 0900  BP: (!) 164/73 (!) 148/57 (!) 148/67   Pulse: 94  84   Resp: 16  18 (!) 24  Temp: 98.2 F (36.8 C)  99 F (37.2 C)   TempSrc: Oral  Oral   SpO2: 98%  98%   Weight:      Height:        Intake/Output Summary (Last 24 hours) at 07/06/2022 0947 Last data filed at 07/06/2022 0900 Gross per 24 hour  Intake 580 ml  Output 400 ml  Net 180 ml    Filed Weights   06/30/22 1700 07/01/22 2215 07/02/22 0440  Weight: 59.1 kg 60.2 kg 60.2 kg     Data Reviewed:   CBC: Recent Labs  Lab 06/30/22 1906 06/30/22 2116 07/02/22 0357 07/03/22 0225 07/04/22 0324 07/05/22 0413 07/06/22 0625  WBC 10.3   < > 9.3 7.4 9.1 9.1 8.6  NEUTROABS 8.3*  --   --   --   --   --   --   HGB 8.3*   < > 9.0* 9.6* 10.3* 9.4* 9.4*  HCT 25.4*   < > 25.4* 28.9* 30.2* 28.6* 28.3*  MCV 90.7   < > 84.9 87.6 87.0 88.5 90.1  PLT 204   < > 198 206 214 223 233   < > = values in this interval not displayed.    Basic Metabolic Panel: Recent Labs  Lab 07/02/22 0357 07/03/22 0225 07/04/22 0324 07/05/22 0413 07/06/22 0625  NA 138 136 139 136 134*  K 4.5 3.9 4.3 4.1 4.0  CL 107 107 102 105 102  CO2 20* 20* 24 21* 22  GLUCOSE 132* 160* 171* 123* 136*  BUN 51* 40* 30* 33* 34*  CREATININE 3.76* 2.52* 1.79* 1.89* 1.88*  CALCIUM 8.6* 8.6* 9.9 9.2 8.8*  MG  --  1.5* 2.6* 2.0 1.8    GFR: Estimated Creatinine Clearance: 16.2 mL/min (A) (by C-G formula based on SCr of 1.88 mg/dL (H)). Liver Function Tests: Recent Labs  Lab 06/30/22 1906 07/02/22 0357  AST 29 26  ALT 18 16  ALKPHOS 147* 148*  BILITOT 0.4 0.9  PROT 6.1* 6.1*  ALBUMIN 2.5* 2.4*    No results for input(s): "LIPASE", "AMYLASE" in the last 168 hours. No results for input(s): "AMMONIA" in the last 168 hours. Coagulation Profile: Recent Labs  Lab 06/30/22 1906  INR 1.3*    Cardiac Enzymes: Recent Labs   Lab 07/02/22 0357  CKTOTAL 188    BNP (last 3 results) No results for input(s): "PROBNP" in the last 8760 hours. HbA1C: No results for input(s): "HGBA1C" in the last 72 hours. CBG: Recent Labs  Lab 07/05/22 0834 07/05/22 1139 07/05/22 1516 07/05/22 2239 07/06/22 0836  GLUCAP 129* 150* 142* 147* 149*    Lipid Profile: No results for input(s): "CHOL", "HDL", "LDLCALC", "TRIG", "CHOLHDL", "LDLDIRECT" in the last 72 hours. Thyroid Function Tests: No results for input(s): "TSH", "T4TOTAL", "FREET4", "T3FREE", "THYROIDAB" in the last 72 hours. Anemia Panel: No results for input(s): "VITAMINB12", "FOLATE", "FERRITIN", "TIBC", "IRON", "RETICCTPCT" in the last 72  hours.  Sepsis Labs: Recent Labs  Lab 07/03/22 0225  PROCALCITON <0.10     Recent Results (from the past 240 hour(s))  Urine Culture     Status: Abnormal   Collection Time: 07/02/22 10:15 AM   Specimen: Urine, Clean Catch  Result Value Ref Range Status   Specimen Description URINE, CLEAN CATCH  Final   Special Requests   Final    NONE Performed at [facility], [address], [place], [place] [address]    Culture (A)  Final    >=100,000 COLONIES/mL ESCHERICHIA COLI >=100,000 COLONIES/mL KLEBSIELLA OXYTOCA    Report Status 07/04/2022 FINAL  Final   Organism ID, Bacteria ESCHERICHIA COLI (A)  Final   Organism ID, Bacteria KLEBSIELLA OXYTOCA (A)  Final      Susceptibility   Escherichia coli - MIC*    AMPICILLIN 8 SENSITIVE Sensitive     CEFAZOLIN <=4 SENSITIVE Sensitive     CEFEPIME <=0.12 SENSITIVE Sensitive     CEFTRIAXONE <=0.25 SENSITIVE Sensitive     CIPROFLOXACIN <=0.25 SENSITIVE Sensitive     GENTAMICIN <=1 SENSITIVE Sensitive     IMIPENEM <=0.25 SENSITIVE Sensitive     NITROFURANTOIN <=16 SENSITIVE Sensitive     TRIMETH/SULFA <=20 SENSITIVE Sensitive     AMPICILLIN/SULBACTAM <=2 SENSITIVE Sensitive     PIP/TAZO <=4 SENSITIVE Sensitive     * >=100,000 COLONIES/mL ESCHERICHIA COLI    Klebsiella oxytoca - MIC*    AMPICILLIN RESISTANT Resistant     CEFAZOLIN <=4 SENSITIVE Sensitive     CEFEPIME <=0.12 SENSITIVE Sensitive     CEFTRIAXONE <=0.25 SENSITIVE Sensitive     CIPROFLOXACIN <=0.25 SENSITIVE Sensitive     GENTAMICIN <=1 SENSITIVE Sensitive     IMIPENEM <=0.25 SENSITIVE Sensitive     NITROFURANTOIN 32 SENSITIVE Sensitive     TRIMETH/SULFA <=20 SENSITIVE Sensitive     AMPICILLIN/SULBACTAM 4 SENSITIVE Sensitive     PIP/TAZO <=4 SENSITIVE Sensitive     * >=100,000 COLONIES/mL KLEBSIELLA OXYTOCA         Radiology Studies: No results found.      Scheduled Meds:  sodium chloride   Intravenous Once   acetaminophen  650 mg Oral Q6H   allopurinol  100 mg Oral BID   amLODipine  10 mg Oral Daily   Chlorhexidine Gluconate Cloth  6 each Topical Daily   docusate sodium  100 mg Oral BID   feeding supplement (NEPRO CARB STEADY)  237 mL Oral BID BM   heparin injection (subcutaneous)  5,000 Units Subcutaneous Q8H   hydrALAZINE  25 mg Oral Q8H   insulin aspart  0-9 Units Subcutaneous TID WC   levETIRAcetam  500 mg Oral q AM   metoprolol tartrate  100 mg Oral BID   mirtazapine  15 mg Oral QHS   multivitamin  1 tablet Oral QHS   pantoprazole  40 mg Oral QHS   QUEtiapine  25 mg Oral QHS   Continuous Infusions:  cefTRIAXone (ROCEPHIN)  IV 1 g (07/05/22 1335)   methocarbamol (ROBAXIN) IV Stopped (07/04/22 0431)     LOS: 6 days   Time spent= 35 mins    Darliss Cheney, MD [facility]  If 7PM-7AM, please contact night-coverage  07/06/2022, 9:47 AM

## 2022-07-06 NOTE — Plan of Care (Signed)
  Problem: Education: Goal: Knowledge of General Education information will improve Description: Including pain rating scale, medication(s)/side effects and non-pharmacologic comfort measures Outcome: Progressing   Problem: Clinical Measurements: Goal: Will remain free from infection Outcome: Progressing   Problem: Activity: Goal: Risk for activity intolerance will decrease Outcome: Progressing   Problem: Coping: Goal: Level of anxiety will decrease Outcome: Progressing   Problem: Pain Managment: Goal: General experience of comfort will improve Outcome: Progressing   

## 2022-07-06 NOTE — Discharge Instructions (Addendum)
Orthopaedic Trauma Service Discharge Instructions   General Discharge Instructions  Orthopaedic Injuries:  Right distal femur fracture treated with open reduction and internal fixation using plate and screws  WEIGHT BEARING STATUS: Nonweightbearing right leg  RANGE OF MOTION/ACTIVITY: Unrestricted range of motion right knee.  Activity as tolerated while maintaining weightbearing restrictions  Bone health: Vitamin D levels are okay.  Fracture is still suggestive of osteoporosis.  Recommend bone density scan in the next 4 to 8 weeks  Review the following resource for additional information regarding bone health  [email or web address]  Wound Care: Daily wound care as needed.  Okay to leave wound open to the air otherwise can cover with a Mepilex dressing which is a silicone foam dressing or you can use 4 x 4 gauze and tape.   Discharge Wound Care Instructions  Do NOT apply any ointments, solutions or lotions to pin sites or surgical wounds.  These prevent needed drainage and even though solutions like hydrogen peroxide kill bacteria, they also damage cells lining the pin sites that help fight infection.  Applying lotions or ointments can keep the wounds moist and can cause them to breakdown and open up as well. This can increase the risk for infection. When in doubt call the office.  Surgical incisions should be dressed daily.  If any drainage is noted, use one layer of adaptic or Mepitel, 4 x 4 gauze and tape.  Alternatively you can use a Mepilex which is a silicone foam dressing  [email or web address].fr [email or web address].[email or web address]  CheapWipes.gl  These dressing supplies should be available at local medical supply stores ([facility] [employer], [facility],  etc). They are not usually carried at places like [facility], [facility], [facility], etc  Once the incision is completely dry and without drainage, it may be left open to air out.  Showering may begin 36-48 hours later.  Cleaning gently with soap and water.  Traumatic wounds should be dressed daily as well.    One layer of adaptic, gauze, Kerlix, then ace wrap.  The adaptic can be discontinued once the draining has ceased    If you have a wet to dry dressing: wet the gauze with saline the squeeze as much saline out so the gauze is moist (not soaking wet), place moistened gauze over wound, then place a dry gauze over the moist one, followed by Kerlix wrap, then ace wrap.  DVT/PE prophylaxis: Eliquis for 30 days   Diet: as you were eating previously.  Can use over the counter stool softeners and bowel preparations, such as Miralax, to help with bowel movements.  Narcotics can be constipating.  Be sure to drink plenty of fluids  PAIN MEDICATION USE AND EXPECTATIONS  You have likely been given narcotic medications to help control your pain.  After a traumatic event that results in an fracture (broken bone) with or without surgery, it is ok to use narcotic pain medications to help control one's pain.  We understand that everyone responds to pain differently and each individual patient will be evaluated on a regular basis for the continued need for narcotic medications. Ideally, narcotic medication use should last no more than 6-8 weeks (coinciding with fracture healing).   As a patient it is your responsibility as well to monitor narcotic medication use and report the amount and frequency you use these medications when you come to your office visit.   We would also advise that if you are using narcotic medications, you should take a dose prior to therapy to maximize you participation.  IF YOU ARE ON NARCOTIC MEDICATIONS IT IS NOT PERMISSIBLE TO OPERATE A MOTOR VEHICLE (MOTORCYCLE/CAR/TRUCK/MOPED) OR HEAVY  MACHINERY DO NOT MIX NARCOTICS WITH OTHER CNS (CENTRAL NERVOUS SYSTEM) DEPRESSANTS SUCH AS ALCOHOL   POST-OPERATIVE OPIOID TAPER INSTRUCTIONS: It is important to wean off of your opioid medication as soon as possible. If you do not need pain medication after your surgery it is ok to stop day one. Opioids include: Codeine, Hydrocodone(Norco, Vicodin), Oxycodone(Percocet, oxycontin) and hydromorphone amongst others.  Long term and even short term use of opiods can cause: Increased pain response Dependence Constipation Depression Respiratory depression And more.  Withdrawal symptoms can include Flu like symptoms Nausea, vomiting And more Techniques to manage these symptoms Hydrate well Eat regular healthy meals Stay active Use relaxation techniques(deep breathing, meditating, yoga) Do Not substitute Alcohol to help with tapering If you have been on opioids for less than two weeks and do not have pain than it is ok to stop all together.  Plan to wean off of opioids This plan should start within one week post op of your fracture surgery  Maintain the same interval or time between taking each dose and first decrease the dose.  Cut the total daily intake of opioids by one tablet each day Next start to increase the time between doses. The last dose that should be eliminated is the evening dose.    STOP SMOKING OR USING NICOTINE PRODUCTS!!!!  As discussed nicotine severely impairs your body's ability to heal surgical and traumatic wounds but also impairs bone healing.  Wounds and bone heal by forming microscopic blood vessels (angiogenesis) and nicotine is a vasoconstrictor (essentially, shrinks blood vessels).  Therefore, if vasoconstriction occurs to these microscopic blood vessels they essentially disappear and are unable to deliver necessary nutrients to the healing tissue.  This is one modifiable factor that you can do to dramatically increase your chances of healing your injury.     (This means no smoking, no nicotine gum, patches, etc)  DO NOT USE NONSTEROIDAL ANTI-INFLAMMATORY DRUGS (NSAID'S)  Using products such as Advil (ibuprofen), Aleve (naproxen), Motrin (ibuprofen) for additional pain control during fracture healing can delay and/or prevent the healing response.  If you would like to take over the counter (OTC) medication, Tylenol (acetaminophen) is ok.  However, some narcotic medications that are given for pain control contain acetaminophen as well. Therefore, you should not exceed more than 4000 mg of tylenol in a day if you do not have liver disease.  Also note that there are may OTC medicines, such as cold medicines and allergy medicines that my contain tylenol as well.  If you have any questions about medications and/or interactions please ask your doctor/PA or your pharmacist.      ICE AND ELEVATE INJURED/OPERATIVE EXTREMITY  Using ice and elevating the injured extremity above your heart can help with swelling and pain control.  Icing in a pulsatile fashion, such as 20 minutes on and 20 minutes off, can be followed.    Do not place ice directly on skin. Make sure there is a barrier between to skin and the ice pack.    Using frozen items such as frozen peas works well as the conform nicely to the are that needs to be iced.  USE AN ACE WRAP OR TED HOSE FOR SWELLING CONTROL  In addition to icing and elevation, Ace wraps or TED hose are used to help limit and resolve swelling.  It is recommended to use Ace wraps or TED hose until  you are informed to stop.    When using Ace Wraps start the wrapping distally (farthest away from the body) and wrap proximally (closer to the body)   Example: If you had surgery on your leg or thing and you do not have a splint on, start the ace wrap at the toes and work your way up to the thigh        If you had surgery on your upper extremity and do not have a splint on, start the ace wrap at your fingers and work your way up to the upper  arm  IF YOU ARE IN A SPLINT OR CAST DO NOT [place]   If your splint gets wet for any reason please contact the office immediately. You may shower in your splint or cast as long as you keep it dry.  This can be done by wrapping in a cast cover or garbage back (or similar)  Do Not stick any thing down your splint or cast such as pencils, money, or hangers to try and scratch yourself with.  If you feel itchy take benadryl as prescribed on the bottle for itching  IF YOU ARE IN A CAM BOOT (BLACK BOOT)  You may remove boot periodically. Perform daily dressing changes as noted below.  Wash the liner of the boot regularly and wear a sock when wearing the boot. It is recommended that you sleep in the boot until told otherwise    Call office for the following: Temperature greater than 101F Persistent nausea and vomiting Severe uncontrolled pain Redness, tenderness, or signs of infection (pain, swelling, redness, odor or green/yellow discharge around the site) Difficulty breathing, headache or visual disturbances Hives Persistent dizziness or light-headedness Extreme fatigue Any other questions or concerns you may have after discharge  In an emergency, call 911 or go to an Emergency Department at a nearby hospital  HELPFUL INFORMATION  If you had a block, it will wear off between 8-24 hrs postop typically.  This is period when your pain may go from nearly zero to the pain you would have had postop without the block.  This is an abrupt transition but nothing dangerous is happening.  You may take an extra dose of narcotic when this happens.  You should wean off your narcotic medicines as soon as you are able.  Most patients will be off or using minimal narcotics before their first postop appointment.   We suggest you use the pain medication the first night prior to going to bed, in order to ease any pain when the anesthesia wears off. You should avoid taking pain medications on an  empty stomach as it will make you nauseous.  Do not drink alcoholic beverages or take illicit drugs when taking pain medications.  In most states it is against the law to drive while you are in a splint or sling.  And certainly against the law to drive while taking narcotics.  You may return to work/school in the next couple of days when you feel up to it.   Pain medication may make you constipated.  Below are a few solutions to try in this order: Decrease the amount of pain medication if you aren't having pain. Drink lots of decaffeinated fluids. Drink prune juice and/or each dried prunes  If the first 3 don't work start with additional solutions Take Colace - an over-the-counter stool softener Take Senokot - an over-the-counter laxative Take Miralax - a stronger over-the-counter laxative  CALL THE OFFICE WITH ANY QUESTIONS OR CONCERNS: [phone]   VISIT OUR WEBSITE FOR ADDITIONAL INFORMATION: [email or web address]

## 2022-07-06 NOTE — Progress Notes (Signed)
Occupational Therapy Treatment Patient Details Name: [name] MRN: [id number] DOB: September 29, 1928 Today's Date: 07/06/2022   History of present illness Pt is a 88 y/o F admitted on 06/30/22. Pt was on vacation with her daughter in [place] when she fell & was taken to hospital. Her daughter [name] vaced her out. Pt found to have R complex distal femur fx. Pt underwent ORIF of distal femur fx by Dr. Marcelino Scot on 07/03/22. PMH: HTN, DM2, CKD Stage IV, dementia, diastolic CHF, GERD, glaucoma.   OT comments  Pt in bed upon therapy arrival with Daughter, [name] present. Rehab tech assisted during session 2/2 pt increased level of assist. Session focused on increased activity tolerance and participation during bed mobility and sitting balance while at EOB. Continues to require 2 person assist for bed mobility due to pt resistance. HR remained WFL during entire session which is improved from evaluation. Pt was provided pain meds prior to therapy session. Pt was crying out and weepy when any mobility was completed. Daughter reports that she is for sure taking patient home with [facility] therapy. Discussed recommended equipment: hoyer lift, hospital bed, 3n1 and informed case management of equipment needs.    Recommendations for follow up therapy are one component of a multi-disciplinary discharge planning process, led by the attending physician.  Recommendations may be updated based on patient status, additional functional criteria and insurance authorization.    Follow Up Recommendations  Home health OT     Assistance Recommended at Discharge Frequent or constant Supervision/Assistance  Patient can return home with the following  Two people to help with walking and/or transfers;Two people to help with bathing/dressing/bathroom;Assistance with feeding;Assistance with cooking/housework;Direct supervision/assist for medications management;Direct supervision/assist for financial management;Help with stairs or ramp for  entrance;Assist for transportation   Equipment Recommendations  BSC/3in1;Wheelchair cushion (measurements OT);Hospital bed;Wheelchair (measurements OT);Other (comment) (hoyer lift)       Precautions / Restrictions Precautions Precautions: Fall Precaution Comments: dementia, poor pain tolerance Restrictions Weight Bearing Restrictions: Yes RLE Weight Bearing: Non weight bearing       Mobility Bed Mobility Overal bed mobility: Needs Assistance Bed Mobility: Supine to Sit, Sit to Supine     Supine to sit: Total assist, +2 for physical assistance, HOB elevated Sit to supine: Total assist, +2 for physical assistance, HOB elevated   General bed mobility comments: Pt completed transition from supine to sitting on EOB with slow progressive movements. Encouraged use of bed rail to assist while providing HHA. Pt required assist to bring trunk off bed and hospital pad used to swing BLE off bed with less force. Patient Response: Anxious  Transfers Overall transfer level:  (Unable to attempt t/f this date)       Balance Overall balance assessment: Needs assistance Sitting-balance support: Feet supported, Bilateral upper extremity supported Sitting balance-Leahy Scale: Good Sitting balance - Comments: Once pt was assisted into proper sitting posture on EOB, she was able to hold herself upright with periodic min guard for safety. Pt resisted placing full weight onto right hip when seated and frequently leaned to the left resting onto left elbow. Postural control: Left lateral lean (occassional by choice)   Standing balance-Leahy Scale:  (not assessed)       ADL either performed or assessed with clinical judgement      Cognition Arousal/Alertness: Awake/alert Behavior During Therapy: Anxious Overall Cognitive Status: History of cognitive impairments - at baseline Area of Impairment: Following commands, Awareness, Safety/judgement, Problem solving, Orientation, Memory      Orientation  Level: Disoriented to, Place, Time, Situation   Memory: Decreased recall of precautions, Decreased short-term memory Following Commands: Follows one step commands inconsistently Safety/Judgement: Decreased awareness of safety, Decreased awareness of deficits   Problem Solving: Requires verbal cues, Requires tactile cues, Slow processing, Decreased initiation, Difficulty sequencing General Comments: daughter [name] present and encouraging patient during session.              General Comments HR remained WFL during session when in bed, seated on EOB, and during all bed mobility.    Pertinent Vitals/ Pain       Pain Assessment Pain Assessment: Faces Faces Pain Scale: Hurts worst Pain Location: distal, lateral R thigh with any movement and mobility Pain Descriptors / Indicators: Aching, Crying, Grimacing, Discomfort, Constant, Moaning, Operative site guarding Pain Intervention(s): Monitored during session, Limited activity within patient's tolerance, Premedicated before session         Frequency  Min 2X/week        Progress Toward Goals  OT Goals(current goals can now be found in the care plan section)  Progress towards OT goals: Progressing toward goals     Plan Frequency remains appropriate;Discharge plan needs to be updated       AM-PAC OT "6 Clicks" Daily Activity     Outcome Measure   Help from another person eating meals?: A Lot Help from another person taking care of personal grooming?: A Little Help from another person toileting, which includes using toliet, bedpan, or urinal?: Total Help from another person bathing (including washing, rinsing, drying)?: A Lot Help from another person to put on and taking off regular upper body clothing?: A Little Help from another person to put on and taking off regular lower body clothing?: Total 6 Click Score: 12    End of Session Equipment Utilized During Treatment: Oxygen  OT Visit Diagnosis: Muscle  weakness (generalized) (M62.81);History of falling (Z91.81);Pain Pain - Right/Left: Right Pain - part of body: Leg   Activity Tolerance Patient limited by pain   Patient Left in bed;with call bell/phone within reach;with family/visitor present           Time: 1332-1406 OT Time Calculation (min): 34 min  Charges: OT General Charges $OT Visit: 1 Visit OT Treatments $Therapeutic Activity: 23-37 mins  Ailene Ravel, OTR/L,CBIS  Supplemental OT - [facility] and [facility]   Lasaundra Riche, Clarene Duke 07/06/2022, 2:23 PM

## 2022-07-06 NOTE — TOC Progression Note (Signed)
Transition of Care [facility]) - Progression Note    Patient Details  Name: [name] MRN: [id number] Date of Birth: 17-Jan-1929  [facility]) CM/SW [place], RN Phone Number: 07/06/2022, 3:32 PM  Clinical Narrative:    Spoke with daughter  via phone about DC needs. Daughter indicates she expects that home health aide  will be in every day  to do ADL's according to" medicare guidelines. " Explained that it is up to the agency on their assessment what services and frequency they can offer. Also, any round the clock or extra care sitting would be a out of pocket cost. CSW Marya Amsler gave daughter a list of [place] from [email or web address]. Discussed that certain agencies have contracts with [facility], but we will follow up for her choices.  Daughter seems to have a unrealistic expectation of services. Did discuss that likely services are not offered every day as this would be more skilled care rehab. She reiterated that the patient would not be going to skilled care. She has been trying to get a hold of the HTA navigator and has been unsuccessful so far.  DME recommended by PT and OT was ordered in system however daughter wants to hold off on calling [employer], as she will need to get someone to move bed before hospital bed can be brought to house. Her expectation is that " she will not be discharged until all equipment and services are set" she states  will not take her home if DC this weekend, as things will not likely be ready.   TOC will follow    Expected Discharge Plan:  (TBD) Barriers to Discharge: Continued Medical Work up  Expected Discharge Plan and Services Expected Discharge Plan:  (TBD) In-house Referral: Clinical Social Work   Post Acute Care Choice: IP Rehab Living arrangements for the past 2 months: Single Family Home                                       Social Determinants of Health (SDOH) Interventions    Readmission Risk Interventions     No data to display

## 2022-07-06 NOTE — Care Management (Signed)
    Durable Medical Equipment  (From admission, onward)           Start     Ordered   07/06/22 1457  For home use only DME 3 n 1  Once        07/06/22 1459   07/06/22 1457  For home use only DME standard manual wheelchair with seat cushion  Once       Comments: Patient suffers from femur fracture which impairs their ability to perform daily activities like toileting in the home.  A walker will not resolve issue with performing activities of daily living. A wheelchair will allow patient to safely perform daily activities. Patient can safely propel the wheelchair in the home or has a caregiver who can provide assistance. Length of need Lifetime. Accessories: elevating leg rests (ELRs), wheel locks, extensions and anti-tippers.   07/06/22 1459   07/06/22 1453  For home use only DME Hospital bed  Once       Question Answer Comment  Length of Need Lifetime   Patient has (list medical condition): femur fracture nwb   The above medical condition requires: Patient requires the ability to reposition frequently   Head must be elevated greater than: 30 degrees   Bed type Semi-electric   Hoyer Lift Yes   Support Surface: Gel Overlay      07/06/22 1459

## 2022-07-06 NOTE — Progress Notes (Signed)
Orthopaedic Trauma Service Progress Note  Patient ID: [name] MRN: [id number] DOB/AGE: 86-30-1930 86 y.o.  Subjective:  No acute issues   Still with foley   Renal function stable   Review of Systems  Unable to perform ROS: Dementia    Objective:   VITALS:   Vitals:   07/05/22 2242 07/06/22 0524 07/06/22 0728 07/06/22 0900  BP: (!) 164/73 (!) 148/57 (!) 148/67   Pulse: 94  84   Resp: 16  18 (!) 24  Temp: 98.2 F (36.8 C)  99 F (37.2 C)   TempSrc: Oral  Oral   SpO2: 98%  98%   Weight:      Height:        Estimated body mass index is 25.08 kg/m as calculated from the following:   Height as of this encounter: '5\' 1"'$  (1.549 m).   Weight as of this encounter: 60.2 kg.   Intake/Output      12/07 0701 12/08 0700 12/08 0701 12/09 0700   P.O.  120   I.V. (mL/kg) 360 (6)    IV Piggyback 100    Total Intake(mL/kg) 460 (7.6) 120 (2)   Urine (mL/kg/hr) 400 (0.3)    Total Output 400    Net +60 +120          LABS  Results for orders placed or performed during the hospital encounter of 06/30/22 (from the past 24 hour(s))  Glucose, capillary     Status: Abnormal   Collection Time: 07/05/22  3:16 PM  Result Value Ref Range   Glucose-Capillary 142 (H) 70 - 99 mg/dL  Glucose, capillary     Status: Abnormal   Collection Time: 07/05/22 10:39 PM  Result Value Ref Range   Glucose-Capillary 147 (H) 70 - 99 mg/dL  Basic metabolic panel     Status: Abnormal   Collection Time: 07/06/22  6:25 AM  Result Value Ref Range   Sodium 134 (L) 135 - 145 mmol/L   Potassium 4.0 3.5 - 5.1 mmol/L   Chloride 102 98 - 111 mmol/L   CO2 22 22 - 32 mmol/L   Glucose, Bld 136 (H) 70 - 99 mg/dL   BUN 34 (H) 8 - 23 mg/dL   Creatinine, Ser 1.88 (H) 0.44 - 1.00 mg/dL   Calcium 8.8 (L) 8.9 - 10.3 mg/dL   GFR, Estimated 25 (L) >60 mL/min   Anion gap 10 5 - 15  CBC     Status: Abnormal   Collection Time: 07/06/22   6:25 AM  Result Value Ref Range   WBC 8.6 4.0 - 10.5 K/uL   RBC 3.14 (L) 3.87 - 5.11 MIL/uL   Hemoglobin 9.4 (L) 12.0 - 15.0 g/dL   HCT 28.3 (L) 36.0 - 46.0 %   MCV 90.1 80.0 - 100.0 fL   MCH 29.9 26.0 - 34.0 pg   MCHC 33.2 30.0 - 36.0 g/dL   RDW 16.5 (H) 11.5 - 15.5 %   Platelets 233 150 - 400 K/uL   nRBC 0.2 0.0 - 0.2 %  Magnesium     Status: None   Collection Time: 07/06/22  6:25 AM  Result Value Ref Range   Magnesium 1.8 1.7 - 2.4 mg/dL  Glucose, capillary     Status: Abnormal   Collection Time: 07/06/22  8:36 AM  Result Value Ref Range   Glucose-Capillary 149 (H) 70 - 99 mg/dL   Comment 1 Notify RN      PHYSICAL EXAM:   Gen: sitting up in bed, looks more comfortable.  Patient is on air mattress Ext:       Right Lower Extremity              Dressing R thigh clean, dry and intact    Dressings removed   Incisions look excellent   No drainage or signs of infection             Ext warm              + DP pulse             No DCT             Compartments soft              Motor and sensory functions grossly intact  Right heel without any evidence of pressure sores.  Assessment/Plan: 3 Days Post-Op     Anti-infectives (From admission, onward)    Start     Dose/Rate Route Frequency Ordered Stop   07/04/22 1200  cefTRIAXone (ROCEPHIN) 1 g in sodium chloride 0.9 % 100 mL IVPB        1 g 200 mL/hr over 30 Minutes Intravenous Every 24 hours 07/02/22 1335 07/07/22 1159   07/03/22 0726  sodium chloride 0.9 % with cefTRIAXone (ROCEPHIN) ADS Med       Note to Pharmacy: Cameron Sprang M: cabinet override      07/03/22 0726 07/03/22 0838   07/03/22 0700  ceFAZolin (ANCEF) IVPB 2g/100 mL premix  Status:  Discontinued        2 g 200 mL/hr over 30 Minutes Intravenous On call to O.R. 07/02/22 1048 07/02/22 1104   07/03/22 0700  cefTRIAXone (ROCEPHIN) 2 g in sodium chloride 0.9 % 100 mL IVPB        2 g 200 mL/hr over 30 Minutes Intravenous To Surgery 07/02/22 1335 07/03/22  0835   07/02/22 1200  cefTRIAXone (ROCEPHIN) 1 g in sodium chloride 0.9 % 100 mL IVPB        1 g 200 mL/hr over 30 Minutes Intravenous Every 24 hours 07/02/22 1056 07/02/22 1320     .  POD/HD#: 70  88 y/o female with ground-level fall right periprosthetic distal femur fracture   -Ground-level fall   -Right periprosthetic distal femur fracture s/p ORIF              Nonweightbearing right leg for 6 weeks             Restricted range of motion right knee             Dressing changed today    Changes needed   Okay to clean wounds with soap and water             Ice and elevate for swelling control and pain             Float heels off bed               PT and OT evaluations   PT- please teach HEP for R knee ROM- AROM, PROM. No ROM restrictions.  Quad sets, SLR, LAQ, SAQ, heel slides, stretching   Ankle theraband program, heel cord stretching, toe towel curls, etc   No pillows under bend of knee when at rest, ok to place under heel  to help work on extension. Can also use zero knee bone foam if available     - Pain management:             Multimodal               change all narcotics to PRN                           Continue scheduled tylenol    - ABL anemia/Hemodynamics             Stable                   - Medical issues              Per medicine and nephrology                            Renal function improved   Defer Foley to medicine team               Bowel regimen    - DVT/PE prophylaxis:             Eliquis per pharmacy consult  Recommend 30 days of DVT/PE prophylaxis  - ID:              Was on Rocephin for UTI.  This was adequate for perioperative coverage  - Metabolic Bone Disease:             Fragility fracture                       Vitamin d levels are ok    - Activity:             As above   - Impediments to fracture healing:             Age/fragility fracture/bone quality   - Dispo:             Ortho issues addressed             PT/OT  evals              SNF vs CIR                           Optimize pain control to maximize her ability to participate with therapies   Follow-up with orthopedics in 10 days for suture removal and x-rays  Jari Pigg, PA-C [phone] (C) 07/06/2022, 11:49 AM  [facility] [place] [id number] [phone] Jenetta [phone] (F)    After 5pm and on the weekends please log on to Amion, go to orthopaedics and the look under the [facility] Call for the provider(s) on call. You can also call our office at [phone] and then follow the prompts to be connected to the call team.  Patient ID: [name], female   DOB: 1929-03-28, 88 y.o.   MRN: [id number]

## 2022-07-07 DIAGNOSIS — S72491A Other fracture of lower end of right femur, initial encounter for closed fracture: Secondary | ICD-10-CM | POA: Diagnosis not present

## 2022-07-07 LAB — GLUCOSE, CAPILLARY
Glucose-Capillary: 135 mg/dL — ABNORMAL HIGH (ref 70–99)
Glucose-Capillary: 180 mg/dL — ABNORMAL HIGH (ref 70–99)
Glucose-Capillary: 101 mg/dL — ABNORMAL HIGH (ref 70–99)
Glucose-Capillary: 183 mg/dL — ABNORMAL HIGH (ref 70–99)

## 2022-07-07 LAB — CBC
HCT: 28.9 % — ABNORMAL LOW (ref 36.0–46.0)
Hemoglobin: 9.2 g/dL — ABNORMAL LOW (ref 12.0–15.0)
MCH: 29.1 pg (ref 26.0–34.0)
MCHC: 31.8 g/dL (ref 30.0–36.0)
MCV: 91.5 fL (ref 80.0–100.0)
Platelets: 259 10*3/uL (ref 150–400)
RBC: 3.16 MIL/uL — ABNORMAL LOW (ref 3.87–5.11)
RDW: 16.3 % — ABNORMAL HIGH (ref 11.5–15.5)
WBC: 8 10*3/uL (ref 4.0–10.5)
nRBC: 0.3 % — ABNORMAL HIGH (ref 0.0–0.2)

## 2022-07-07 LAB — BASIC METABOLIC PANEL
Anion gap: 11 (ref 5–15)
BUN: 36 mg/dL — ABNORMAL HIGH (ref 8–23)
CO2: 22 mmol/L (ref 22–32)
Calcium: 8.9 mg/dL (ref 8.9–10.3)
Chloride: 103 mmol/L (ref 98–111)
Creatinine, Ser: 1.97 mg/dL — ABNORMAL HIGH (ref 0.44–1.00)
GFR, Estimated: 24 mL/min — ABNORMAL LOW (ref >60)
Glucose, Bld: 115 mg/dL — ABNORMAL HIGH (ref 70–99)
Potassium: 3.9 mmol/L (ref 3.5–5.1)
Sodium: 136 mmol/L (ref 135–145)

## 2022-07-07 LAB — MAGNESIUM: Magnesium: 1.8 mg/dL (ref 1.7–2.4)

## 2022-07-07 NOTE — Progress Notes (Signed)
PROGRESS NOTE    [name]  YDX:[id number] DOB: 12/19/1928 DOA: 06/30/2022 PCP: Seward Carol, MD   Brief Narrative:  88 year old female with medical history of hypertension, diabetes mellitus type 2, stage IV CKD, dementia, diastolic CHF, GERD who was on vacation with her daughter in [place] when she suffered a mechanical fall.  Patient went to balcony when she missed a step and fell down.  Her daughter found her on her back with right leg shortened and on outwards.  She was transferred to hospital.  Also surgeons at [place] told her that was a complex fracture and did not think it was safe to travel home.  Her daughter medi vaced her out today and travel time was 3 hours and 6 minutes. There is no paper chart.  During hospitalization she was found to have right distal femur periprosthetic fracture, acute anemia and off-and-on agitation/hallucination. Underwent OR today, Renal func is improving.    Assessment & Plan:  Principal Problem:   Closed fracture of right distal femur ([place]) Active Problems:   Acute respiratory failure with hypoxia (HCC)   Acute renal failure superimposed on stage 4 chronic kidney disease (HCC)   Acute on chronic anemia   Chronic diastolic CHF (congestive heart failure) (HCC)   Type 2 diabetes mellitus with complication, without long-term current use of insulin (HCC)   Hypertension   GERD (gastroesophageal reflux disease)   history of seizures   Dementia ([place])   Right distal femur periprosthetic fracture Mechanical fall -Sustained fall in [place].  S/p surgical repair by Dr. Marcelino Scot on 07/03/2022.  Pain is now controlled, she appears comfortable. -CT head negative   Acute hypoxemic respiratory failure: - Elevated D-dimer, likely from atelectasis.  Low suspicion for PE.  Currently saturating 100% on 2 L.  Perhaps she does not need that.  Aggressive use of incentive spirometer and flutter valve.  Procalcitonin is negative, BNP 309   Iron deficiency  anemia/acute on chronic anemia -Baseline hemoglobin 9, admission hemoglobin less than 7.  Status post transfusion, hemoglobin stable ever since.   Acute kidney injury on CKD stage 4. -Patient's baseline creatinine 1.47 as of 03/15/2022.  Admission creatinine 4.45, creatinine continues to improve with IV fluids.  This morning 1.79 yesterday and 1.97 today.  Nephrology signed off on 07/04/2022.  UTI: Patient was started on Rocephin for UTI.  Urine culture is growing E. coli and Klebsiella oxytoca.  Will complete 5 days of Rocephin.   History of seizures: -Has been seizure-free for many years.  Continue Keppra.    Dementia without behavior disturbance -Continue Remeron, Seroquel nightly   Diabetes mellitus type 2 -Hemoglobin A1c 6.4.  Blood sugar controlled. -Sliding scale and Accu-Cheks  Hypomagnesemia: Resolved.  DVT prophylaxis: Subcu heparin Code Status: Full code Family Communication: None at bedside today. Status is: Inpatient Remains inpatient appropriate because: Patient needs SNF.  Patient's family wants patient to go to CIR, if not then home but they do not want to take patient home for next 4 to 5 days and want her to get daily physical therapy here.  Nutritional status    Signs/Symptoms: estimated needs  Interventions: Nepro shake, MVI  Body mass index is 25.08 kg/m.         Subjective:   Patient seen and examined.  She is fully alert and oriented to place and person today.  Much better than yesterday.  Other complaint.  Examination:  General exam: Appears calm and comfortable  Respiratory system: Clear to auscultation.  Respiratory effort normal. Cardiovascular system: S1 & S2 heard, RRR. No JVD, murmurs, rubs, gallops or clicks. No pedal edema. Gastrointestinal system: Abdomen is nondistended, soft and nontender. No organomegaly or masses felt. Normal bowel sounds heard. Central nervous system: Alert and oriented x 2. No focal neurological  deficits. Extremities: Symmetric 5 x 5 power. Skin: No rashes, lesions or ulcers.  Psychiatry: Judgement and insight appear normal. Mood & affect appropriate.   Objective: Vitals:   07/06/22 1326 07/06/22 2125 07/07/22 0427 07/07/22 0723  BP: (!) 129/54 (!) 151/68 (!) 140/49 (!) 147/56  Pulse:  72 67 66  Resp:  (!) 21 17   Temp:  98 F (36.7 C) 98.7 F (37.1 C) 98.3 F (36.8 C)  TempSrc:  Oral Oral Oral  SpO2:  99% 94% 96%  Weight:      Height:        Intake/Output Summary (Last 24 hours) at 07/07/2022 1103 Last data filed at 07/07/2022 0700 Gross per 24 hour  Intake 125 ml  Output 1600 ml  Net -1475 ml    Filed Weights   06/30/22 1700 07/01/22 2215 07/02/22 0440  Weight: 59.1 kg 60.2 kg 60.2 kg     Data Reviewed:   CBC: Recent Labs  Lab 06/30/22 1906 06/30/22 2116 07/03/22 0225 07/04/22 0324 07/05/22 0413 07/06/22 0625 07/07/22 0231  WBC 10.3   < > 7.4 9.1 9.1 8.6 8.0  NEUTROABS 8.3*  --   --   --   --   --   --   HGB 8.3*   < > 9.6* 10.3* 9.4* 9.4* 9.2*  HCT 25.4*   < > 28.9* 30.2* 28.6* 28.3* 28.9*  MCV 90.7   < > 87.6 87.0 88.5 90.1 91.5  PLT 204   < > 206 214 223 233 259   < > = values in this interval not displayed.    Basic Metabolic Panel: Recent Labs  Lab 07/03/22 0225 07/04/22 0324 07/05/22 0413 07/06/22 0625 07/07/22 0231  NA 136 139 136 134* 136  K 3.9 4.3 4.1 4.0 3.9  CL 107 102 105 102 103  CO2 20* 24 21* 22 22  GLUCOSE 160* 171* 123* 136* 115*  BUN 40* 30* 33* 34* 36*  CREATININE 2.52* 1.79* 1.89* 1.88* 1.97*  CALCIUM 8.6* 9.9 9.2 8.8* 8.9  MG 1.5* 2.6* 2.0 1.8 1.8    GFR: Estimated Creatinine Clearance: 15.5 mL/min (A) (by C-G formula based on SCr of 1.97 mg/dL (H)). Liver Function Tests: Recent Labs  Lab 06/30/22 1906 07/02/22 0357  AST 29 26  ALT 18 16  ALKPHOS 147* 148*  BILITOT 0.4 0.9  PROT 6.1* 6.1*  ALBUMIN 2.5* 2.4*    No results for input(s): "LIPASE", "AMYLASE" in the last 168 hours. No results for  input(s): "AMMONIA" in the last 168 hours. Coagulation Profile: Recent Labs  Lab 06/30/22 1906  INR 1.3*    Cardiac Enzymes: Recent Labs  Lab 07/02/22 0357  CKTOTAL 188    BNP (last 3 results) No results for input(s): "PROBNP" in the last 8760 hours. HbA1C: No results for input(s): "HGBA1C" in the last 72 hours. CBG: Recent Labs  Lab 07/06/22 0836 07/06/22 1223 07/06/22 1656 07/06/22 2128 07/07/22 0819  GLUCAP 149* 157* 135* 134* 135*    Lipid Profile: No results for input(s): "CHOL", "HDL", "LDLCALC", "TRIG", "CHOLHDL", "LDLDIRECT" in the last 72 hours. Thyroid Function Tests: No results for input(s): "TSH", "T4TOTAL", "FREET4", "T3FREE", "THYROIDAB" in the last 72 hours. Anemia Panel: No  results for input(s): "VITAMINB12", "FOLATE", "FERRITIN", "TIBC", "IRON", "RETICCTPCT" in the last 72 hours.  Sepsis Labs: Recent Labs  Lab 07/03/22 0225  PROCALCITON <0.10     Recent Results (from the past 240 hour(s))  Urine Culture     Status: Abnormal   Collection Time: 07/02/22 10:15 AM   Specimen: Urine, Clean Catch  Result Value Ref Range Status   Specimen Description URINE, CLEAN CATCH  Final   Special Requests   Final    NONE Performed at [facility], [address], [place], [place] [address]    Culture (A)  Final    >=100,000 COLONIES/mL ESCHERICHIA COLI >=100,000 COLONIES/mL KLEBSIELLA OXYTOCA    Report Status 07/04/2022 FINAL  Final   Organism ID, Bacteria ESCHERICHIA COLI (A)  Final   Organism ID, Bacteria KLEBSIELLA OXYTOCA (A)  Final      Susceptibility   Escherichia coli - MIC*    AMPICILLIN 8 SENSITIVE Sensitive     CEFAZOLIN <=4 SENSITIVE Sensitive     CEFEPIME <=0.12 SENSITIVE Sensitive     CEFTRIAXONE <=0.25 SENSITIVE Sensitive     CIPROFLOXACIN <=0.25 SENSITIVE Sensitive     GENTAMICIN <=1 SENSITIVE Sensitive     IMIPENEM <=0.25 SENSITIVE Sensitive     NITROFURANTOIN <=16 SENSITIVE Sensitive     TRIMETH/SULFA <=20 SENSITIVE  Sensitive     AMPICILLIN/SULBACTAM <=2 SENSITIVE Sensitive     PIP/TAZO <=4 SENSITIVE Sensitive     * >=100,000 COLONIES/mL ESCHERICHIA COLI   Klebsiella oxytoca - MIC*    AMPICILLIN RESISTANT Resistant     CEFAZOLIN <=4 SENSITIVE Sensitive     CEFEPIME <=0.12 SENSITIVE Sensitive     CEFTRIAXONE <=0.25 SENSITIVE Sensitive     CIPROFLOXACIN <=0.25 SENSITIVE Sensitive     GENTAMICIN <=1 SENSITIVE Sensitive     IMIPENEM <=0.25 SENSITIVE Sensitive     NITROFURANTOIN 32 SENSITIVE Sensitive     TRIMETH/SULFA <=20 SENSITIVE Sensitive     AMPICILLIN/SULBACTAM 4 SENSITIVE Sensitive     PIP/TAZO <=4 SENSITIVE Sensitive     * >=100,000 COLONIES/mL KLEBSIELLA OXYTOCA         Radiology Studies: No results found.      Scheduled Meds:  sodium chloride   Intravenous Once   acetaminophen  650 mg Oral Q6H   allopurinol  100 mg Oral BID   amLODipine  10 mg Oral Daily   Chlorhexidine Gluconate Cloth  6 each Topical Daily   docusate sodium  100 mg Oral BID   feeding supplement (NEPRO CARB STEADY)  237 mL Oral BID BM   heparin injection (subcutaneous)  5,000 Units Subcutaneous Q8H   hydrALAZINE  25 mg Oral Q8H   insulin aspart  0-9 Units Subcutaneous TID WC   levETIRAcetam  500 mg Oral q AM   metoprolol tartrate  100 mg Oral BID   mirtazapine  15 mg Oral QHS   multivitamin  1 tablet Oral QHS   pantoprazole  40 mg Oral QHS   QUEtiapine  25 mg Oral QHS   Continuous Infusions:  cefTRIAXone (ROCEPHIN)  IV     ferric gluconate (FERRLECIT) IVPB 250 mg (07/07/22 0950)   methocarbamol (ROBAXIN) IV Stopped (07/04/22 0431)     LOS: 7 days   Time spent= 35 mins    Darliss Cheney, MD [facility]  If 7PM-7AM, please contact night-coverage  07/07/2022, 11:03 AM

## 2022-07-07 NOTE — Plan of Care (Signed)
  Problem: Clinical Measurements: Goal: Diagnostic test results will improve Outcome: Progressing   Problem: Nutrition: Goal: Adequate nutrition will be maintained Outcome: Progressing   Problem: Coping: Goal: Level of anxiety will decrease Outcome: Progressing

## 2022-07-08 ENCOUNTER — Other Ambulatory Visit: Payer: Self-pay

## 2022-07-08 DIAGNOSIS — S72491A Other fracture of lower end of right femur, initial encounter for closed fracture: Secondary | ICD-10-CM | POA: Diagnosis not present

## 2022-07-08 LAB — BASIC METABOLIC PANEL
Anion gap: 14 (ref 5–15)
BUN: 35 mg/dL — ABNORMAL HIGH (ref 8–23)
CO2: 21 mmol/L — ABNORMAL LOW (ref 22–32)
Calcium: 8.9 mg/dL (ref 8.9–10.3)
Chloride: 104 mmol/L (ref 98–111)
Creatinine, Ser: 1.78 mg/dL — ABNORMAL HIGH (ref 0.44–1.00)
GFR, Estimated: 27 mL/min — ABNORMAL LOW (ref >60)
Glucose, Bld: 115 mg/dL — ABNORMAL HIGH (ref 70–99)
Potassium: 3.8 mmol/L (ref 3.5–5.1)
Sodium: 139 mmol/L (ref 135–145)

## 2022-07-08 LAB — CBC
HCT: 27.6 % — ABNORMAL LOW (ref 36.0–46.0)
Hemoglobin: 9.2 g/dL — ABNORMAL LOW (ref 12.0–15.0)
MCH: 30.1 pg (ref 26.0–34.0)
MCHC: 33.3 g/dL (ref 30.0–36.0)
MCV: 90.2 fL (ref 80.0–100.0)
Platelets: 282 10*3/uL (ref 150–400)
RBC: 3.06 MIL/uL — ABNORMAL LOW (ref 3.87–5.11)
RDW: 16.6 % — ABNORMAL HIGH (ref 11.5–15.5)
WBC: 8.2 10*3/uL (ref 4.0–10.5)
nRBC: 0.4 % — ABNORMAL HIGH (ref 0.0–0.2)

## 2022-07-08 LAB — GLUCOSE, CAPILLARY
Glucose-Capillary: 119 mg/dL — ABNORMAL HIGH (ref 70–99)
Glucose-Capillary: 144 mg/dL — ABNORMAL HIGH (ref 70–99)
Glucose-Capillary: 152 mg/dL — ABNORMAL HIGH (ref 70–99)
Glucose-Capillary: 151 mg/dL — ABNORMAL HIGH (ref 70–99)

## 2022-07-08 LAB — MAGNESIUM: Magnesium: 1.8 mg/dL (ref 1.7–2.4)

## 2022-07-08 MED ORDER — INFLUENZA VAC A&B SA ADJ QUAD 0.5 ML IM PRSY
0.5000 mL | PREFILLED_SYRINGE | INTRAMUSCULAR | Status: AC
  Administered 2022-07-09: 0.5 mL via INTRAMUSCULAR
  Filled 2022-07-08: qty 0.5

## 2022-07-08 NOTE — Op Note (Signed)
07/03/2022  6:04 PM  PATIENT:  [name]  88 y.o. female  PRE-OPERATIVE DIAGNOSIS:  RIGHT PERIPROSTHETIC SUPRACONDYLAR FEMUR FRACTURE  POST-OPERATIVE DIAGNOSIS:  RIGHT PERIPROSTHETIC SUPRACONDYLAR FEMUR FRACTURE   PROCEDURE:  OPEN REDUCTION INTERNAL FIXATION OF RIGHT PERIPROSTHETIC FEMUR FRACTURE WITH BIOMET NCB PLATE  SURGEON:  Altamese [place], MD  PHYSICIAN ASSISTANT: Ainsley Spinner, PA-C  ANESTHESIA:   GENERAL  I/O:  Please refer to anesthetic record.  SPECIMEN:  No Specimen  TOURNIQUET:  NONE  COMPLICATIONS: NONE  DICTATION: Note written in EPIC  DISPOSITION: TO PACU  CONDITION: STABLE  DELAY START OF DVT PROPHYLAXIS BECAUSE OF BLEEDING RISK: NO  BRIEF SUMMARY OF INDICATION FOR PROCEDURE:  [name] is a 88 y.o. who sustained a comminuted supracondylar femur fracture, which was quite distal and near to the femoral implant, [place] with exacerbation of multiple medical issues prior to evaluation.  The risks and benefits of surgery were discussed with the patient and family, including the possibility of infection, nerve injury, vessel injury, wound breakdown, arthritis, symptomatic hardware, DVT/ PE, loss of motion, malunion, nonunion, heart attack, stroke, death, implant loosening, and need for further surgery among others.  These risks were acknowledged and consent provided to proceed.   BRIEF SUMMARY OF PROCEDURE:  The patient was taken to the operating room where general anesthesia was induced and after receipt of preoperative antibiotics.  The right lower extremity was prepped and draped in usual sterile fashion.  No tourniquet was used during the procedure.  A radiolucent triangle was placed underneath the femur and towel bumps to restore appropriate alignment and length. C-arm was brought in to confirm the appropriate position of the distal incision.  This was checked on lateral as well.  An incision was then made.  Dissection was  carried down to the IT band.  It was split in line with the incision.  The deep protractor was placed.  Hematoma evacuated. My assistant pulled and maintained traction and dialed in the rotation for alignment.  I then introduced the Biomet NCB plate. I placed a pin distally parallel to the femoral component and joint line of the tibial tray and then checked the position on both AP and lateral views proximally, placing a single screw distally and a drill bit through the most proximal hole.  To create a bridge construct. I then placed multiple screws in the articular block, checking their trajector and alignment with fluoro, followed by additional standard screws proximally.  Locking caps were placed over the heads of the screws distally but none proximally after confirming appropriate position of all screws on orthogonal views.  Wounds were irrigated thoroughly and then closed in standard layered fashion using #1 Vicryl for the tensor, 0 Vicryl for the deep subcu, 2-0 Vicryl and 2-0 vertical mattress sutures for the skin. Under live fluoroscopy I then performed stress evaluation of the joint with manual application of stress which did not show instability to suggest collateral ligament injury. A sterile gently compressive dressing was then applied with an Ace wrap from foot to thigh as well as a knee immobilizer until the patient wakes up adequately from anesthesia at which time she will be allowed unrestricted range of motion. Ainsley Spinner, PA-C, assisted me throughout as did a PA student and an assistant was necessary to obtain and maintain reduction during provisional and definitive fixation and also assisted with wound closure.   PROGNOSIS:  Because of comorbidities and increased BMI, patient is at  increased risk for perioperative complications. PT/ OT to assist with touch down weightbearing and unrestricted range of motion of the knee without bracing.  Formal pharmacologic DVT prophylaxis will resume with  Eliquis. F/u in 10-14 days for removal of sutures.     [name]. Marcelino Scot, M.D.

## 2022-07-08 NOTE — Progress Notes (Signed)
PROGRESS NOTE    [name]  EZM:[id number] DOB: Dec 23, 1928 DOA: 06/30/2022 PCP: Seward Carol, MD   Brief Narrative:  88 year old female with medical history of hypertension, diabetes mellitus type 2, stage IV CKD, dementia, diastolic CHF, GERD who was on vacation with her daughter in [place] when she suffered a mechanical fall.  Patient went to balcony when she missed a step and fell down.  Her daughter found her on her back with right leg shortened and on outwards.  She was transferred to hospital.  Also surgeons at [place] told her that was a complex fracture and did not think it was safe to travel home.  Her daughter medi vaced her out today and travel time was 3 hours and 6 minutes. There is no paper chart.  During hospitalization she was found to have right distal femur periprosthetic fracture, acute anemia and off-and-on agitation/hallucination. Underwent OR today, Renal func is improving.    Assessment & Plan:  Principal Problem:   Closed fracture of right distal femur ([place]) Active Problems:   Acute respiratory failure with hypoxia (HCC)   Acute renal failure superimposed on stage 4 chronic kidney disease (HCC)   Acute on chronic anemia   Chronic diastolic CHF (congestive heart failure) (HCC)   Type 2 diabetes mellitus with complication, without long-term current use of insulin (HCC)   Hypertension   GERD (gastroesophageal reflux disease)   history of seizures   Dementia ([place])   Right distal femur periprosthetic fracture s/p mechanical fall: -Sustained fall in [place].  S/p surgical repair by Dr. Marcelino Scot on 07/03/2022.  Pain is now controlled, she appears comfortable. -CT head negative.  PT OT has recommended SNF but daughter has declined that.  Daughter wants the patient to go home only after she stays so many days in the hospital and gets daily physical therapy.   Acute hypoxemic respiratory failure: - Elevated D-dimer, likely from atelectasis.  Low suspicion for  PE.  Currently saturating 100% on 2 L.  Perhaps she does not need that.  Aggressive use of incentive spirometer and flutter valve.  Procalcitonin is negative, BNP 309   Iron deficiency anemia/acute on chronic anemia -Baseline hemoglobin 9, admission hemoglobin less than 7.  Status post transfusion, hemoglobin stable ever since.   Acute kidney injury on CKD stage 4. -Patient's baseline creatinine 1.47 as of 03/15/2022.  Admission creatinine 4.45, creatinine continues to improve with IV fluids.  This morning 1.78 today.  Nephrology signed off on 07/04/2022.  UTI: Patient was started on Rocephin for UTI.  Urine culture is growing E. coli and Klebsiella oxytoca.  Will complete 5 days of Rocephin.   History of seizures: -Has been seizure-free for many years.  Continue Keppra.    Dementia without behavior disturbance -Continue Remeron, Seroquel nightly   Diabetes mellitus type 2 -Hemoglobin A1c 6.4.  Blood sugar controlled. -Sliding scale and Accu-Cheks  Hypomagnesemia: Resolved.  DVT prophylaxis: Subcu heparin Code Status: Full code Family Communication: None at bedside today. Status is: Inpatient Remains inpatient appropriate because: Patient needs SNF.  Patient's family wants patient to go to CIR, if not then home but they do not want to take patient home for next 4 to 5 days and want her to get daily physical therapy here.  Nutritional status    Signs/Symptoms: estimated needs  Interventions: Nepro shake, MVI  Body mass index is 25.08 kg/m.         Subjective:   Patient and examined.  She is alert  and oriented to self and place.  1 family member is at the bedside.  Patient keeps saying that she is not feeling well but she does not, with any specific complaint.  Examination:  General exam: Appears calm and comfortable  Respiratory system: Clear to auscultation. Respiratory effort normal. Cardiovascular system: S1 & S2 heard, RRR. No JVD, murmurs, rubs, gallops or  clicks. No pedal edema. Gastrointestinal system: Abdomen is nondistended, soft and nontender. No organomegaly or masses felt. Normal bowel sounds heard. Central nervous system: Alert and oriented x 1-2. No focal neurological deficits. Extremities: Symmetric 5 x 5 power. Skin: No rashes, lesions or ulcers.   Objective: Vitals:   07/07/22 1500 07/07/22 1951 07/08/22 0403 07/08/22 0804  BP: 131/71 (!) 165/77 (!) 156/63 (!) 154/66  Pulse: 66 74 68 70  Resp:  '20 18 14  '$ Temp: 98.8 F (37.1 C) 98.5 F (36.9 C) 98.4 F (36.9 C) 98.1 F (36.7 C)  TempSrc: Oral Oral Oral Oral  SpO2: 99% 97% 100% 99%  Weight:      Height:        Intake/Output Summary (Last 24 hours) at 07/08/2022 1007 Last data filed at 07/08/2022 0245 Gross per 24 hour  Intake 1543.13 ml  Output --  Net 1543.13 ml    Filed Weights   06/30/22 1700 07/01/22 2215 07/02/22 0440  Weight: 59.1 kg 60.2 kg 60.2 kg     Data Reviewed:   CBC: Recent Labs  Lab 07/04/22 0324 07/05/22 0413 07/06/22 0625 07/07/22 0231 07/08/22 0336  WBC 9.1 9.1 8.6 8.0 8.2  HGB 10.3* 9.4* 9.4* 9.2* 9.2*  HCT 30.2* 28.6* 28.3* 28.9* 27.6*  MCV 87.0 88.5 90.1 91.5 90.2  PLT 214 223 233 259 177    Basic Metabolic Panel: Recent Labs  Lab 07/04/22 0324 07/05/22 0413 07/06/22 0625 07/07/22 0231 07/08/22 0336  NA 139 136 134* 136 139  K 4.3 4.1 4.0 3.9 3.8  CL 102 105 102 103 104  CO2 24 21* 22 22 21*  GLUCOSE 171* 123* 136* 115* 115*  BUN 30* 33* 34* 36* 35*  CREATININE 1.79* 1.89* 1.88* 1.97* 1.78*  CALCIUM 9.9 9.2 8.8* 8.9 8.9  MG 2.6* 2.0 1.8 1.8 1.8    GFR: Estimated Creatinine Clearance: 17.2 mL/min (A) (by C-G formula based on SCr of 1.78 mg/dL (H)). Liver Function Tests: Recent Labs  Lab 07/02/22 0357  AST 26  ALT 16  ALKPHOS 148*  BILITOT 0.9  PROT 6.1*  ALBUMIN 2.4*    No results for input(s): "LIPASE", "AMYLASE" in the last 168 hours. No results for input(s): "AMMONIA" in the last 168  hours. Coagulation Profile: No results for input(s): "INR", "PROTIME" in the last 168 hours.  Cardiac Enzymes: Recent Labs  Lab 07/02/22 0357  CKTOTAL 188    BNP (last 3 results) No results for input(s): "PROBNP" in the last 8760 hours. HbA1C: No results for input(s): "HGBA1C" in the last 72 hours. CBG: Recent Labs  Lab 07/07/22 1206 07/07/22 1656 07/07/22 2041 07/08/22 0807 07/08/22 0832  GLUCAP 180* 101* 183* 119* 144*    Lipid Profile: No results for input(s): "CHOL", "HDL", "LDLCALC", "TRIG", "CHOLHDL", "LDLDIRECT" in the last 72 hours. Thyroid Function Tests: No results for input(s): "TSH", "T4TOTAL", "FREET4", "T3FREE", "THYROIDAB" in the last 72 hours. Anemia Panel: No results for input(s): "VITAMINB12", "FOLATE", "FERRITIN", "TIBC", "IRON", "RETICCTPCT" in the last 72 hours.  Sepsis Labs: Recent Labs  Lab 07/03/22 0225  PROCALCITON <0.10     Recent Results (from  the past 240 hour(s))  Urine Culture     Status: Abnormal   Collection Time: 07/02/22 10:15 AM   Specimen: Urine, Clean Catch  Result Value Ref Range Status   Specimen Description URINE, CLEAN CATCH  Final   Special Requests   Final    NONE Performed at [facility], [address] [place], [place] [address]    Culture (A)  Final    >=100,000 COLONIES/mL ESCHERICHIA COLI >=100,000 COLONIES/mL KLEBSIELLA OXYTOCA    Report Status 07/04/2022 FINAL  Final   Organism ID, Bacteria ESCHERICHIA COLI (A)  Final   Organism ID, Bacteria KLEBSIELLA OXYTOCA (A)  Final      Susceptibility   Escherichia coli - MIC*    AMPICILLIN 8 SENSITIVE Sensitive     CEFAZOLIN <=4 SENSITIVE Sensitive     CEFEPIME <=0.12 SENSITIVE Sensitive     CEFTRIAXONE <=0.25 SENSITIVE Sensitive     CIPROFLOXACIN <=0.25 SENSITIVE Sensitive     GENTAMICIN <=1 SENSITIVE Sensitive     IMIPENEM <=0.25 SENSITIVE Sensitive     NITROFURANTOIN <=16 SENSITIVE Sensitive     TRIMETH/SULFA <=20 SENSITIVE Sensitive      AMPICILLIN/SULBACTAM <=2 SENSITIVE Sensitive     PIP/TAZO <=4 SENSITIVE Sensitive     * >=100,000 COLONIES/mL ESCHERICHIA COLI   Klebsiella oxytoca - MIC*    AMPICILLIN RESISTANT Resistant     CEFAZOLIN <=4 SENSITIVE Sensitive     CEFEPIME <=0.12 SENSITIVE Sensitive     CEFTRIAXONE <=0.25 SENSITIVE Sensitive     CIPROFLOXACIN <=0.25 SENSITIVE Sensitive     GENTAMICIN <=1 SENSITIVE Sensitive     IMIPENEM <=0.25 SENSITIVE Sensitive     NITROFURANTOIN 32 SENSITIVE Sensitive     TRIMETH/SULFA <=20 SENSITIVE Sensitive     AMPICILLIN/SULBACTAM 4 SENSITIVE Sensitive     PIP/TAZO <=4 SENSITIVE Sensitive     * >=100,000 COLONIES/mL KLEBSIELLA OXYTOCA         Radiology Studies: No results found.      Scheduled Meds:  sodium chloride   Intravenous Once   acetaminophen  650 mg Oral Q6H   allopurinol  100 mg Oral BID   amLODipine  10 mg Oral Daily   Chlorhexidine Gluconate Cloth  6 each Topical Daily   docusate sodium  100 mg Oral BID   feeding supplement (NEPRO CARB STEADY)  237 mL Oral BID BM   heparin injection (subcutaneous)  5,000 Units Subcutaneous Q8H   hydrALAZINE  25 mg Oral Q8H   insulin aspart  0-9 Units Subcutaneous TID WC   levETIRAcetam  500 mg Oral q AM   metoprolol tartrate  100 mg Oral BID   mirtazapine  15 mg Oral QHS   multivitamin  1 tablet Oral QHS   pantoprazole  40 mg Oral QHS   QUEtiapine  25 mg Oral QHS   Continuous Infusions:  cefTRIAXone (ROCEPHIN)  IV     ferric gluconate (FERRLECIT) IVPB 135 mL/hr at 07/08/22 0245   methocarbamol (ROBAXIN) IV Stopped (07/04/22 0431)     LOS: 8 days   Time spent= 35 mins    Darliss Cheney, MD [facility]  If 7PM-7AM, please contact night-coverage  07/08/2022, 10:07 AM

## 2022-07-09 DIAGNOSIS — S72491A Other fracture of lower end of right femur, initial encounter for closed fracture: Secondary | ICD-10-CM | POA: Diagnosis not present

## 2022-07-09 LAB — BASIC METABOLIC PANEL
Anion gap: 12 (ref 5–15)
BUN: 34 mg/dL — ABNORMAL HIGH (ref 8–23)
CO2: 19 mmol/L — ABNORMAL LOW (ref 22–32)
Calcium: 9 mg/dL (ref 8.9–10.3)
Chloride: 107 mmol/L (ref 98–111)
Creatinine, Ser: 1.81 mg/dL — ABNORMAL HIGH (ref 0.44–1.00)
GFR, Estimated: 26 mL/min — ABNORMAL LOW (ref >60)
Glucose, Bld: 116 mg/dL — ABNORMAL HIGH (ref 70–99)
Potassium: 4.2 mmol/L (ref 3.5–5.1)
Sodium: 138 mmol/L (ref 135–145)

## 2022-07-09 LAB — GLUCOSE, CAPILLARY
Glucose-Capillary: 126 mg/dL — ABNORMAL HIGH (ref 70–99)
Glucose-Capillary: 131 mg/dL — ABNORMAL HIGH (ref 70–99)
Glucose-Capillary: 137 mg/dL — ABNORMAL HIGH (ref 70–99)
Glucose-Capillary: 153 mg/dL — ABNORMAL HIGH (ref 70–99)
Glucose-Capillary: 166 mg/dL — ABNORMAL HIGH (ref 70–99)

## 2022-07-09 LAB — CBC
HCT: 28.9 % — ABNORMAL LOW (ref 36.0–46.0)
Hemoglobin: 9.1 g/dL — ABNORMAL LOW (ref 12.0–15.0)
MCH: 29.1 pg (ref 26.0–34.0)
MCHC: 31.5 g/dL (ref 30.0–36.0)
MCV: 92.3 fL (ref 80.0–100.0)
Platelets: 279 10*3/uL (ref 150–400)
RBC: 3.13 MIL/uL — ABNORMAL LOW (ref 3.87–5.11)
RDW: 17.2 % — ABNORMAL HIGH (ref 11.5–15.5)
WBC: 7.7 10*3/uL (ref 4.0–10.5)
nRBC: 0.5 % — ABNORMAL HIGH (ref 0.0–0.2)

## 2022-07-09 LAB — MAGNESIUM: Magnesium: 1.7 mg/dL (ref 1.7–2.4)

## 2022-07-09 NOTE — TOC Progression Note (Signed)
Transition of Care [facility]) - Progression Note    Patient Details  Name: MAILI SHUTTERS MRN: [id number] Date of Birth: August 17, 1928  Transition of [facility]) CM/SW Contact  Bartholomew Crews, RN Phone Number: [phone] 07/09/2022, 12:52 PM  Clinical Narrative:     Spoke with patient at the bedside to discuss post acute transition. She is agreeable to [facility] for [facility] services, however, she now realizes after working with PT that her mom needs more than she can offer her at home. [name] would like her mom to receive about 2 weeks of SNF rehab, and then transition home. [name] understands that [facility] services may only a benefit if patient goes from hospital to home. [name] would like her mom to go to [facility] for SNF. CSW made aware.   Expected Discharge Plan: [place] Barriers to Discharge: Family Issues  Expected Discharge Plan and Services Expected Discharge Plan: [place] In-house Referral: NA Discharge Planning Services: CM Consult Post Acute Care Choice: [place] arrangements for the past 2 months: [place]                 DME Arranged: Hospital bed, Wheelchair manual DME Agency: [employer] Date DME Agency Contacted: 07/09/22 Time DME Agency Contacted: [phone] Representative spoke with at DME Agency: [name] HH Arranged: PT, OT, Nurse's Aide [place] Agency: [place] Date [place]: 07/09/22 Time [place]: 1112 Representative spoke with at [place]: [place] Determinants of Health ([place]) Interventions    Readmission Risk Interventions     No data to display

## 2022-07-09 NOTE — Plan of Care (Signed)
  Problem: Health Behavior/Discharge Planning: Goal: Ability to manage health-related needs will improve Outcome: Progressing   Problem: Activity: Goal: Risk for activity intolerance will decrease Outcome: Progressing   Problem: Nutrition: Goal: Adequate nutrition will be maintained Outcome: Progressing   Problem: Fluid Volume: Goal: Ability to maintain a balanced intake and output will improve Outcome: Progressing

## 2022-07-09 NOTE — Progress Notes (Signed)
PROGRESS NOTE    [name]  QQI:[id number] DOB: 1929-07-20 DOA: 06/30/2022 PCP: Seward Carol, MD   Brief Narrative:  88 year old female with medical history of hypertension, diabetes mellitus type 2, stage IV CKD, dementia, diastolic CHF, GERD who was on vacation with her daughter in [place] when she suffered a mechanical fall.  Patient went to balcony when she missed a step and fell down.  Her daughter found her on her back with right leg shortened and on outwards.  She was transferred to hospital.  Also surgeons at [place] told her that was a complex fracture and did not think it was safe to travel home.  Her daughter medi vaced her out today and travel time was 3 hours and 6 minutes. There is no paper chart.  During hospitalization she was found to have right distal femur periprosthetic fracture, acute anemia and off-and-on agitation/hallucination. Underwent OR today, Renal func is improving.    Assessment & Plan:  Principal Problem:   Closed fracture of right distal femur ([place]) Active Problems:   Acute respiratory failure with hypoxia (HCC)   Acute renal failure superimposed on stage 4 chronic kidney disease (HCC)   Acute on chronic anemia   Chronic diastolic CHF (congestive heart failure) (HCC)   Type 2 diabetes mellitus with complication, without long-term current use of insulin (HCC)   Hypertension   GERD (gastroesophageal reflux disease)   history of seizures   Dementia ([place])   Right distal femur periprosthetic fracture s/p mechanical fall: -Sustained fall in [place].  S/p surgical repair by Dr. Marcelino Scot on 07/03/2022.  Pain is now controlled, she appears comfortable. -CT head negative.  PT OT has recommended SNF but daughter has declined that.  Daughter wants the patient to go home only after she stays so many days in the hospital and gets daily physical therapy.  Initially daughter was agreeable to take patient home on Monday but when I spoke to the daughter at bedside  today, she states that she is not ready because she has not received the DME required or requested which includes [facility] lift as well as hospital bed.  I have conveyed this message to [facility] so they can arrange this.   Acute hypoxemic respiratory failure: - Elevated D-dimer, likely from atelectasis.  Low suspicion for PE.  Currently saturating 100% on 2 L.  Perhaps she does not need that.  Aggressive use of incentive spirometer and flutter valve.  Procalcitonin is negative, BNP 309   Iron deficiency anemia/acute on chronic anemia -Baseline hemoglobin 9, admission hemoglobin less than 7.  Status post transfusion, hemoglobin stable ever since.   Acute kidney injury on CKD stage 4. -Patient's baseline creatinine 1.47 as of 03/15/2022.  Admission creatinine 4.45, creatinine continues to improve with IV fluids.  This morning 1.78 today.  Nephrology signed off on 07/04/2022.  UTI: Patient was started on Rocephin for UTI.  Urine culture is growing E. coli and Klebsiella oxytoca.  Will complete 5 days of Rocephin.   History of seizures: -Has been seizure-free for many years.  Continue Keppra.    Dementia without behavior disturbance -Continue Remeron, Seroquel nightly   Diabetes mellitus type 2 -Hemoglobin A1c 6.4.  Blood sugar controlled. -Sliding scale and Accu-Cheks  Hypomagnesemia: Resolved.  DVT prophylaxis: Subcu heparin Code Status: Full code Family Communication: Daughter at bedside. Status is: Inpatient Remains inpatient appropriate because: Patient needs SNF.  Patient's family wants patient to go to [facility], if not then home but they do not  want to take patient home for next 4 to 5 days and want her to get daily physical therapy here.  Nutritional status    Signs/Symptoms: estimated needs  Interventions: Nepro shake, MVI  Body mass index is 25.08 kg/m.         Subjective:   Seen and examined.  As you know patient complains that she is not feeling well but she has no specific  complaint.  She is alert and oriented to place and person.  This is her baseline.  Examination:  General exam: Appears calm and comfortable  Respiratory system: Clear to auscultation. Respiratory effort normal. Cardiovascular system: S1 & S2 heard, RRR. No JVD, murmurs, rubs, gallops or clicks. No pedal edema. Gastrointestinal system: Abdomen is nondistended, soft and nontender. No organomegaly or masses felt. Normal bowel sounds heard. Central nervous system: Alert and oriented x 2. No focal neurological deficits. Extremities: Symmetric 5 x 5 power. Skin: No rashes, lesions or ulcers.    Objective: Vitals:   07/08/22 2053 07/09/22 0353 07/09/22 0528 07/09/22 0735  BP: (!) 136/55 (!) 141/57 (!) 144/49 (!) 152/59  Pulse: 72 63  74  Resp: 16 16    Temp: 98 F (36.7 C) 98.3 F (36.8 C)  98.3 F (36.8 C)  TempSrc: Oral Oral  Oral  SpO2: 99% 100%  93%  Weight:      Height:        Intake/Output Summary (Last 24 hours) at 07/09/2022 0940 Last data filed at 07/08/2022 2255 Gross per 24 hour  Intake 60 ml  Output 751 ml  Net -691 ml    Filed Weights   07/01/22 2215 07/02/22 0440  Weight: 60.2 kg 60.2 kg     Data Reviewed:   CBC: Recent Labs  Lab 07/05/22 0413 07/06/22 0625 07/07/22 0231 07/08/22 0336 07/09/22 0359  WBC 9.1 8.6 8.0 8.2 7.7  HGB 9.4* 9.4* 9.2* 9.2* 9.1*  HCT 28.6* 28.3* 28.9* 27.6* 28.9*  MCV 88.5 90.1 91.5 90.2 92.3  PLT 223 233 259 282 671    Basic Metabolic Panel: Recent Labs  Lab 07/05/22 0413 07/06/22 0625 07/07/22 0231 07/08/22 0336 07/09/22 0359  NA 136 134* 136 139 138  K 4.1 4.0 3.9 3.8 4.2  CL 105 102 103 104 107  CO2 21* 22 22 21* 19*  GLUCOSE 123* 136* 115* 115* 116*  BUN 33* 34* 36* 35* 34*  CREATININE 1.89* 1.88* 1.97* 1.78* 1.81*  CALCIUM 9.2 8.8* 8.9 8.9 9.0  MG 2.0 1.8 1.8 1.8 1.7    GFR: Estimated Creatinine Clearance: 16.9 mL/min (A) (by C-G formula based on SCr of 1.81 mg/dL (H)). Liver Function Tests: No  results for input(s): "AST", "ALT", "ALKPHOS", "BILITOT", "PROT", "ALBUMIN" in the last 168 hours.  No results for input(s): "LIPASE", "AMYLASE" in the last 168 hours. No results for input(s): "AMMONIA" in the last 168 hours. Coagulation Profile: No results for input(s): "INR", "PROTIME" in the last 168 hours.  Cardiac Enzymes: No results for input(s): "CKTOTAL", "CKMB", "CKMBINDEX", "TROPONINI" in the last 168 hours.  BNP (last 3 results) No results for input(s): "PROBNP" in the last 8760 hours. HbA1C: No results for input(s): "HGBA1C" in the last 72 hours. CBG: Recent Labs  Lab 07/08/22 0832 07/08/22 1206 07/08/22 1631 07/09/22 0035 07/09/22 0736  GLUCAP 144* 152* 151* 131* 137*    Lipid Profile: No results for input(s): "CHOL", "HDL", "LDLCALC", "TRIG", "CHOLHDL", "LDLDIRECT" in the last 72 hours. Thyroid Function Tests: No results for input(s): "TSH", "T4TOTAL", "FREET4", "  T3FREE", "THYROIDAB" in the last 72 hours. Anemia Panel: No results for input(s): "VITAMINB12", "FOLATE", "FERRITIN", "TIBC", "IRON", "RETICCTPCT" in the last 72 hours.  Sepsis Labs: Recent Labs  Lab 07/03/22 0225  PROCALCITON <0.10     Recent Results (from the past 240 hour(s))  Urine Culture     Status: Abnormal   Collection Time: 07/02/22 10:15 AM   Specimen: Urine, Clean Catch  Result Value Ref Range Status   Specimen Description URINE, CLEAN CATCH  Final   Special Requests   Final    NONE Performed at [facility], [address] [place], [place] [address]    Culture (A)  Final    >=100,000 COLONIES/mL ESCHERICHIA COLI >=100,000 COLONIES/mL KLEBSIELLA OXYTOCA    Report Status 07/04/2022 FINAL  Final   Organism ID, Bacteria ESCHERICHIA COLI (A)  Final   Organism ID, Bacteria KLEBSIELLA OXYTOCA (A)  Final      Susceptibility   Escherichia coli - MIC*    AMPICILLIN 8 SENSITIVE Sensitive     CEFAZOLIN <=4 SENSITIVE Sensitive     CEFEPIME <=0.12 SENSITIVE Sensitive      CEFTRIAXONE <=0.25 SENSITIVE Sensitive     CIPROFLOXACIN <=0.25 SENSITIVE Sensitive     GENTAMICIN <=1 SENSITIVE Sensitive     IMIPENEM <=0.25 SENSITIVE Sensitive     NITROFURANTOIN <=16 SENSITIVE Sensitive     TRIMETH/SULFA <=20 SENSITIVE Sensitive     AMPICILLIN/SULBACTAM <=2 SENSITIVE Sensitive     PIP/TAZO <=4 SENSITIVE Sensitive     * >=100,000 COLONIES/mL ESCHERICHIA COLI   Klebsiella oxytoca - MIC*    AMPICILLIN RESISTANT Resistant     CEFAZOLIN <=4 SENSITIVE Sensitive     CEFEPIME <=0.12 SENSITIVE Sensitive     CEFTRIAXONE <=0.25 SENSITIVE Sensitive     CIPROFLOXACIN <=0.25 SENSITIVE Sensitive     GENTAMICIN <=1 SENSITIVE Sensitive     IMIPENEM <=0.25 SENSITIVE Sensitive     NITROFURANTOIN 32 SENSITIVE Sensitive     TRIMETH/SULFA <=20 SENSITIVE Sensitive     AMPICILLIN/SULBACTAM 4 SENSITIVE Sensitive     PIP/TAZO <=4 SENSITIVE Sensitive     * >=100,000 COLONIES/mL KLEBSIELLA OXYTOCA         Radiology Studies: No results found.      Scheduled Meds:  sodium chloride   Intravenous Once   acetaminophen  650 mg Oral Q6H   allopurinol  100 mg Oral BID   amLODipine  10 mg Oral Daily   Chlorhexidine Gluconate Cloth  6 each Topical Daily   docusate sodium  100 mg Oral BID   feeding supplement (NEPRO CARB STEADY)  237 mL Oral BID BM   heparin injection (subcutaneous)  5,000 Units Subcutaneous Q8H   hydrALAZINE  25 mg Oral Q8H   influenza vaccine adjuvanted  0.5 mL Intramuscular Tomorrow-1000   insulin aspart  0-9 Units Subcutaneous TID WC   levETIRAcetam  500 mg Oral q AM   metoprolol tartrate  100 mg Oral BID   mirtazapine  15 mg Oral QHS   multivitamin  1 tablet Oral QHS   pantoprazole  40 mg Oral QHS   QUEtiapine  25 mg Oral QHS   Continuous Infusions:  cefTRIAXone (ROCEPHIN)  IV     methocarbamol (ROBAXIN) IV Stopped (07/04/22 0431)     LOS: 9 days   Time spent= 35 mins    Darliss Cheney, MD [facility]  If 7PM-7AM, please contact  night-coverage  07/09/2022, 9:40 AM

## 2022-07-09 NOTE — NC FL2 (Signed)
[place] LEVEL OF CARE FORM     IDENTIFICATION  Patient Name: [name] Birthdate: [birth year] Sex: female Admission Date (Current Location): 06/30/2022  [facility] and [place] Number:  Herbalist and Address:  [facility] [place]. [facility], [place] [address], [place], [place] [address]      Provider Number: [id number]  Attending Physician Name and Address:  Darliss Cheney, MD  Relative Name and Phone Number:  [place], [place] Daughter [phone]    Current Level of Care: Hospital Recommended Level of Care: [place] Prior Approval Number:    Date Approved/Denied:   PASRR Number: [id number]  Discharge Plan: SNF    Current Diagnoses: Patient Active Problem List   Diagnosis Date Noted   Closed fracture of right distal femur ([place]) 06/30/2022   Chronic diastolic CHF (congestive heart failure) ([place]) 06/30/2022   history of seizures 06/30/2022   Acute respiratory failure with hypoxia ([place]) 06/30/2022   Acute on chronic anemia 06/30/2022   Pneumonia due to COVID-19 virus 03/10/2022   Dementia ([place]) 03/10/2022   Heart failure ([place]) 03/10/2022   AKI (acute kidney injury) ([place]) 03/10/2022   Acute renal failure superimposed on stage 4 chronic kidney disease ([place]) 03/10/2022   Type 2 diabetes mellitus with complication, without long-term current use of insulin (HCC)    Glaucoma    HHNC (hyperglycemic hyperosmolar nonketotic coma) ([place]) 11/24/2020   Hyperglycemia 11/24/2020   UTI (urinary tract infection) 76/54/6503   Acute metabolic encephalopathy 54/65/6812   Hypokalemia 11/24/2020   GERD (gastroesophageal reflux disease)    Hypertension     Orientation RESPIRATION BLADDER Height & Weight     Self  O2 Incontinent Weight:  (Unable to assess) Height:  '5\' 1"'$  (154.9 cm)  BEHAVIORAL SYMPTOMS/MOOD NEUROLOGICAL BOWEL NUTRITION STATUS    Convulsions/Seizures Incontinent Diet (see discharge summary)  AMBULATORY STATUS COMMUNICATION OF NEEDS  Skin   Total Care Verbally Surgical wounds                       Personal Care Assistance Level of Assistance  Bathing, Feeding, Dressing, Total care Bathing Assistance: Maximum assistance Feeding assistance: Limited assistance Dressing Assistance: Maximum assistance Total Care Assistance: Maximum assistance   Functional Limitations Info  Sight, Hearing, Speech Sight Info: Adequate Hearing Info: Impaired Speech Info: Adequate    SPECIAL CARE FACTORS FREQUENCY  PT (By licensed PT), OT (By licensed OT)     PT Frequency: 5x week OT Frequency: 5x week            Contractures Contractures Info: Not present    Additional Factors Info  Code Status, Allergies, Insulin Sliding Scale Code Status Info: full Allergies Info: Ace Inhibitors, Prandin (Repaglinide), Dilaudid (Hydromorphone Hcl), Other, Latex, Tape   Insulin Sliding Scale Info: Novolog: see discharge summary       Current Medications (07/09/2022):  This is the current hospital active medication list Current Facility-Administered Medications  Medication Dose Route Frequency Provider Last Rate Last Admin   0.9 %  sodium chloride infusion (Manually program via Guardrails IV Fluids)   Intravenous Once Ainsley Spinner, PA-C 10 mL/hr at 07/08/22 0245 Infusion Verify at 07/08/22 0245   acetaminophen (TYLENOL) tablet 650 mg  650 mg Oral Q6H Ainsley Spinner, PA-C   650 mg at 07/09/22 7517   allopurinol (ZYLOPRIM) tablet 100 mg  100 mg Oral BID Ainsley Spinner, PA-C   100 mg at 07/09/22 0017   amLODipine (NORVASC) tablet 10 mg  10 mg Oral  Daily Darliss Cheney, MD   10 mg at 07/09/22 1610   cefTRIAXone (ROCEPHIN) 1 g in sodium chloride 0.9 % 100 mL IVPB  1 g Intravenous Once Jardin, Carla G, RPH       Chlorhexidine Gluconate Cloth 2 % PADS 6 each  6 each Topical Daily Ainsley Spinner, PA-C   6 each at 07/09/22 0825   docusate sodium (COLACE) capsule 100 mg  100 mg Oral BID Ainsley Spinner, PA-C   100 mg at 07/08/22 2250   feeding supplement  (NEPRO CARB STEADY) liquid 237 mL  237 mL Oral BID BM Ainsley Spinner, PA-C   237 mL at 07/09/22 0825   fentaNYL (SUBLIMAZE) injection 12.5 mcg  12.5 mcg Intravenous Q8H PRN Ainsley Spinner, PA-C   12.5 mcg at 07/07/22 1000   guaiFENesin (ROBITUSSIN) 100 MG/5ML liquid 5 mL  5 mL Oral Q4H PRN Ainsley Spinner, PA-C       heparin injection 5,000 Units  5,000 Units Subcutaneous Q8H Ainsley Spinner, PA-C   5,000 Units at 07/09/22 0531   hydrALAZINE (APRESOLINE) injection 10 mg  10 mg Intravenous Q4H PRN Ainsley Spinner, PA-C       hydrALAZINE (APRESOLINE) tablet 25 mg  25 mg Oral Q8H Pahwani, Einar Grad, MD   25 mg at 07/09/22 0528   influenza vaccine adjuvanted (FLUAD) injection 0.5 mL  0.5 mL Intramuscular Tomorrow-1000 Pahwani, Einar Grad, MD       insulin aspart (novoLOG) injection 0-9 Units  0-9 Units Subcutaneous TID WC Ainsley Spinner, PA-C   2 Units at 07/09/22 1205   ipratropium-albuterol (DUONEB) 0.5-2.5 (3) MG/3ML nebulizer solution 3 mL  3 mL Nebulization Q4H PRN Ainsley Spinner, PA-C       labetalol (NORMODYNE) injection 10 mg  10 mg Intravenous Q2H PRN Darliss Cheney, MD       levETIRAcetam (KEPPRA) tablet 500 mg  500 mg Oral q AM Ainsley Spinner, PA-C   500 mg at 07/09/22 9604   menthol-cetylpyridinium (CEPACOL) lozenge 3 mg  1 lozenge Oral PRN Ainsley Spinner, PA-C       Or   phenol (CHLORASEPTIC) mouth spray 1 spray  1 spray Mouth/Throat PRN Ainsley Spinner, PA-C       methocarbamol (ROBAXIN) tablet 500 mg  500 mg Oral Q6H PRN Ainsley Spinner, PA-C   500 mg at 07/09/22 5409   Or   methocarbamol (ROBAXIN) 500 mg in dextrose 5 % 50 mL IVPB  500 mg Intravenous Q6H PRN Ainsley Spinner, PA-C   Stopped at 07/04/22 0431   metoCLOPramide (REGLAN) tablet 5-10 mg  5-10 mg Oral Q8H PRN Ainsley Spinner, PA-C       Or   metoCLOPramide (REGLAN) injection 5-10 mg  5-10 mg Intravenous Q8H PRN Ainsley Spinner, PA-C       metoprolol tartrate (LOPRESSOR) injection 5 mg  5 mg Intravenous Q4H PRN Ainsley Spinner, PA-C       metoprolol tartrate (LOPRESSOR) tablet 100 mg  100  mg Oral BID Darliss Cheney, MD   100 mg at 07/09/22 0823   mirtazapine (REMERON) tablet 15 mg  15 mg Oral QHS Ainsley Spinner, PA-C   15 mg at 07/08/22 2249   multivitamin (RENA-VIT) tablet 1 tablet  1 tablet Oral QHS Ainsley Spinner, PA-C   1 tablet at 07/08/22 2250   ondansetron (ZOFRAN) injection 4 mg  4 mg Intravenous Q6H PRN Ainsley Spinner, PA-C   4 mg at 07/03/22 1006   ondansetron (ZOFRAN) tablet 4 mg  4 mg Oral Q6H PRN Ainsley Spinner, PA-C  Or   ondansetron (ZOFRAN) injection 4 mg  4 mg Intravenous Q6H PRN Ainsley Spinner, PA-C       Oral care mouth rinse  15 mL Mouth Rinse PRN Darliss Cheney, MD       oxyCODONE (Oxy IR/ROXICODONE) immediate release tablet 5 mg  5 mg Oral Q6H PRN Ainsley Spinner, PA-C   5 mg at 07/09/22 1041   pantoprazole (PROTONIX) EC tablet 40 mg  40 mg Oral QHS Pahwani, Einar Grad, MD   40 mg at 07/08/22 2250   QUEtiapine (SEROQUEL) tablet 25 mg  25 mg Oral QHS Ainsley Spinner, PA-C   25 mg at 07/08/22 2250   senna-docusate (Senokot-S) tablet 1 tablet  1 tablet Oral QHS PRN Ainsley Spinner, PA-C   1 tablet at 07/04/22 0956   traMADol (ULTRAM) tablet 50 mg  50 mg Oral Q8H PRN Ainsley Spinner, PA-C   50 mg at 07/08/22 1212   traZODone (DESYREL) tablet 50 mg  50 mg Oral QHS PRN Ainsley Spinner, PA-C   50 mg at 07/03/22 2053     Discharge Medications: Please see discharge summary for a list of discharge medications.  Relevant Imaging Results:  Relevant Lab Results:   Additional Information SSN: [id number]  Joanne Chars, LCSW

## 2022-07-09 NOTE — Progress Notes (Signed)
Occupational Therapy Treatment Patient Details Name: [name] MRN: [id number] DOB: 02-04-1929 Today's Date: 07/09/2022   History of present illness Pt is a 88 y/o F admitted on 06/30/22. Pt was on vacation with her daughter in [place] when she fell & was taken to hospital. Her daughter [name] vaced her out. Pt found to have R complex distal femur fx. Pt underwent ORIF of distal femur fx by Dr. Marcelino Scot on 07/03/22. PMH: HTN, DM2, CKD Stage IV, dementia, diastolic CHF, GERD, glaucoma.   OT comments  Focus of session on teaching daughter in how to assist pt with bed mobility using bed pads and use of maximove to lift pt to chair. Pt tolerated well and demonstrated ability to roll with min assist. Daughter tearful at end of session and may need to consider ST rehab in SNF to maximize therapy. CM notified. Updated d/c plan.   Recommendations for follow up therapy are one component of a multi-disciplinary discharge planning process, led by the attending physician.  Recommendations may be updated based on patient status, additional functional criteria and insurance authorization.    Follow Up Recommendations  Skilled nursing-short term rehab (<3 hours/day)     Assistance Recommended at Discharge Frequent or constant Supervision/Assistance  Patient can return home with the following  Two people to help with walking and/or transfers;Two people to help with bathing/dressing/bathroom;Assistance with feeding;Assistance with cooking/housework;Direct supervision/assist for medications management;Direct supervision/assist for financial management;Help with stairs or ramp for entrance;Assist for transportation   Equipment Recommendations  Wheelchair (measurements OT);Wheelchair cushion (measurements OT);Hospital bed;Other (comment) (hoyer lift)    Recommendations for Other Services      Precautions / Restrictions Precautions Precautions: Fall Precaution Comments: seems to be more anxious at  night Restrictions Weight Bearing Restrictions: Yes RLE Weight Bearing: Non weight bearing       Mobility Bed Mobility Overal bed mobility: Needs Assistance Bed Mobility: Rolling Rolling: Min assist, +2 for safety/equipment, Mod assist   Supine to sit: Modified independent (Device/Increase time), HOB elevated     General bed mobility comments: rolled for placement of lift pad with min assist with use of rail, able to pull herself into long sitting    Transfers Overall transfer level: Needs assistance   Transfers: Bed to chair/wheelchair/BSC             General transfer comment: use of maximove with support of R LE Transfer via Lift Equipment: Maximove   Balance                                           ADL either performed or assessed with clinical judgement   ADL Overall ADL's : Needs assistance/impaired                     Lower Body Dressing: Total assistance;Bed level               Functional mobility during ADLs: Total assistance;+2 for safety/equipment      Extremity/Trunk Assessment              Vision       Perception     Praxis      Cognition Arousal/Alertness: Awake/alert Behavior During Therapy: Anxious Overall Cognitive Status: History of cognitive impairments - at baseline Area of Impairment: Following commands, Awareness, Safety/judgement, Problem solving, Orientation, Memory  Orientation Level: Disoriented to, Place, Time, Situation   Memory: Decreased recall of precautions, Decreased short-term memory Following Commands: Follows one step commands with increased time, Follows multi-step commands inconsistently Safety/Judgement: Decreased awareness of safety, Decreased awareness of deficits Awareness: Intellectual Problem Solving: Requires verbal cues, Requires tactile cues, Slow processing, Difficulty sequencing          Exercises      Shoulder Instructions        General Comments VSS on RA, no acute s/sx distress with positional change other than mild pain. Pressure relief cushion and chair alarm on and activated while pt in chair and lift pad still behind her back for safety but bed pad under her hips to protect her skin from lift pad. Straps under legs moved out to sides to prevent pressure.    Pertinent Vitals/ Pain       Pain Assessment Pain Assessment: Faces Faces Pain Scale: Hurts little more Pain Location: R LE Pain Descriptors / Indicators: Grimacing, Discomfort, Constant, Operative site guarding, Sore Pain Intervention(s): Monitored during session, Premedicated before session, Repositioned, Ice applied  Home Living                                          Prior Functioning/Environment              Frequency  Min 2X/week        Progress Toward Goals  OT Goals(current goals can now be found in the care plan section)  Progress towards OT goals: Progressing toward goals  Acute Rehab OT Goals OT Goal Formulation: With family Time For Goal Achievement: 07/18/22 Potential to Achieve Goals: [place] Frequency remains appropriate;Discharge plan needs to be updated    Co-evaluation    PT/OT/SLP Co-Evaluation/Treatment: Yes Reason for Co-Treatment: For patient/therapist safety PT goals addressed during session: Mobility/safety with mobility;Proper use of DME;Strengthening/ROM OT goals addressed during session: ADL's and self-care      AM-PAC OT "6 Clicks" Daily Activity     Outcome Measure   Help from another person eating meals?: A Little Help from another person taking care of personal grooming?: A Little Help from another person toileting, which includes using toliet, bedpan, or urinal?: Total Help from another person bathing (including washing, rinsing, drying)?: A Lot Help from another person to put on and taking off regular upper body clothing?: A Little Help from another person to put on and  taking off regular lower body clothing?: Total 6 Click Score: 13    End of Session    OT Visit Diagnosis: Muscle weakness (generalized) (M62.81);History of falling (Z91.81);Pain   Activity Tolerance Patient tolerated treatment well   Patient Left in chair;with call bell/phone within reach;with chair alarm set   Nurse Communication Need for lift equipment        Time: 6578-4696 OT Time Calculation (min): 36 min  Charges: OT General Charges $OT Visit: 1 Visit OT Treatments $Therapeutic Activity: 8-22 mins  Cleta Alberts, OTR/L Acute Rehabilitation Services Office: [phone]   Malka So 07/09/2022, 12:58 PM

## 2022-07-09 NOTE — Progress Notes (Addendum)
Physical Therapy Treatment Patient Details Name: [name] MRN: [id number] DOB: [birth year] Today's Date: 07/09/2022   History of Present Illness Pt is a 88 y/o F admitted on 06/30/22. Pt was on vacation with her daughter in [place] when she fell & was taken to hospital. Her daughter [name] vaced her out. Pt found to have R complex distal femur fx. Pt underwent ORIF of distal femur fx by Dr. Marcelino Scot on 07/03/22. PMH: HTN, DM2, CKD Stage IV, dementia, diastolic CHF, GERD, glaucoma.    PT Comments    Pt received in supine, agreeable to therapy session, daughter present [name] present and agreeable to instruction on lift pad placement and safety with mechanical lift transfers OOB to chair. Pt with improved pain tolerance and able to assist this date somewhat with rolling for placement of lift pad and with only mild c/o pain per PAIN-AD scale with transfer and while sitting up in recliner. Discussed pressure relief in chair and timing on requesting staff assist with return to bed in ~2 hours, pressure relief cushion under her in chair to protect her skin. Pt continues to benefit from PT services to progress toward functional mobility goals. Pt family still interested in more information about short term skilled rehab options, case manager notified. Pt continues to benefit from PT services to progress toward functional mobility goals.     Recommendations for follow up therapy are one component of a multi-disciplinary discharge planning process, led by the attending physician.  Recommendations may be updated based on patient status, additional functional criteria and insurance authorization.  Follow Up Recommendations  Skilled nursing-short term rehab (<3 hours/day) Can patient physically be transported by private vehicle: No   Assistance Recommended at Discharge Frequent or constant Supervision/Assistance  Patient can return home with the following Two people to help with walking and/or transfers;Two  people to help with bathing/dressing/bathroom;Help with stairs or ramp for entrance;Direct supervision/assist for medications management;Assist for transportation;Assistance with feeding;Direct supervision/assist for financial management;Assistance with Education officer, environmental (measurements PT);Wheelchair cushion (measurements PT);Rolling walker (2 wheels);BSC/3in1;Hospital bed;Other (comment) (mechanical lift and sling)    Recommendations for Other Services       Precautions / Restrictions Precautions Precautions: Fall Precaution Comments: seems to be more anxious at night Restrictions Weight Bearing Restrictions: Yes RLE Weight Bearing: Non weight bearing     Mobility  Bed Mobility Overal bed mobility: Needs Assistance Bed Mobility: Rolling Rolling: Min assist, +2 for safety/equipment, Mod assist   Supine to sit: Total assist (via mechanical lift pad and sling)     General bed mobility comments: good effort by pt for rolling to L then to R side for placement of lift pad under her, pt anxious but with improved tolerance this date; daughter present and instructed on technique as she will be assisting her at home. Min to [facility] for partial long sitting for placement/removal of pillows with pt initiating well this date.    Transfers Overall transfer level: Needs assistance   Transfers: Bed to chair/wheelchair/BSC             General transfer comment: pt tolerated maxi-move to chair well, pressure relief blue geomat cushion in chair for pt comfort given her NWB status she will have difficulty weight shifting unassisted. Transfer via Lift Equipment: Garment/textile technologist  Modified Rankin (Stroke Patients Only)       Balance       Sitting balance - Comments: defer EOB as primarily instruction was on family training for hoyer lift                                     Cognition Arousal/Alertness: Awake/alert Behavior During Therapy: Anxious Overall Cognitive Status: History of cognitive impairments - at baseline Area of Impairment: Following commands, Awareness, Safety/judgement, Problem solving, Orientation, Memory                 Orientation Level: Disoriented to, Place, Time, Situation   Memory: Decreased recall of precautions, Decreased short-term memory Following Commands: Follows one step commands with increased time, Follows multi-step commands inconsistently Safety/Judgement: Decreased awareness of safety, Decreased awareness of deficits Awareness: Intellectual Problem Solving: Requires verbal cues, Requires tactile cues, Slow processing, Difficulty sequencing General Comments: Daughter [name] present and encouraging patient during session and receptive to instruction on use of hoyer lift and placement of lift pads. Daughter aware of the complexity of her self-care and mobility needs if she is to go home. Pt following simple commands better this date, does need increased time and some multimodal cues for proper follow-through of instructions.        Exercises Other Exercises Other Exercises: supine BLE AROM: heel slides (on L side), ankle pumps x10 reps ea; AA on R side for heel slides while repositioning ~3 reps    General Comments General comments (skin integrity, edema, etc.): VSS on RA, no acute s/sx distress with positional change other than mild pain. Pressure relief cushion and chair alarm on and activated while pt in chair and lift pad still behind her back for safety but bed pad under her hips to protect her skin from lift pad. Straps under legs moved out to sides to prevent pressure. Time spent in family instruction on likely frequencies for therapy at short term SNF vs HH, pt family receptive.      Pertinent Vitals/Pain Pain Assessment Pain Assessment: PAINAD Breathing: normal Negative  Vocalization: occasional moan/groan, low speech, negative/disapproving quality Facial Expression: sad, frightened, frown Body Language: relaxed Consolability: distracted or reassured by voice/touch PAINAD Score: 3 Pain Location: R distal, lateral thigh and knee while seated in chair and with RLE ROM in bed Pain Descriptors / Indicators: Grimacing, Discomfort, Constant, Operative site guarding, Sore Pain Intervention(s): Monitored during session, Premedicated before session, Repositioned, Ice applied           PT Goals (current goals can now be found in the care plan section) Acute Rehab PT Goals Patient Stated Goal: Per daughter [name], decreased pain, to be able to transfer more independently, to go home PT Goal Formulation: With patient/family Time For Goal Achievement: 07/18/22 Progress towards PT goals: Progressing toward goals    Frequency    Min 3X/week      PT Plan Current plan remains appropriate    Co-evaluation PT/OT/SLP Co-Evaluation/Treatment: Yes Reason for Co-Treatment: Complexity of the patient's impairments (multi-system involvement);Necessary to address cognition/behavior during functional activity;For patient/therapist safety;To address functional/ADL transfers PT goals addressed during session: Mobility/safety with mobility;Proper use of DME;Strengthening/ROM        AM-PAC PT "6 Clicks" Mobility   Outcome Measure  Help needed turning from your back to your side while in a flat bed without using bedrails?: A Lot Help needed moving from lying on your back to sitting  on the side of a flat bed without using bedrails?: A Lot Help needed moving to and from a bed to a chair (including a wheelchair)?: Total Help needed standing up from a chair using your arms (e.g., wheelchair or bedside chair)?: Total Help needed to walk in hospital room?: Total Help needed climbing 3-5 steps with a railing? : Total 6 Click Score: 8    End of Session Equipment Utilized  During Treatment: Other (comment) (mechanical lift and pad) Activity Tolerance: Patient tolerated treatment well Patient left: in chair;with call bell/phone within reach;with chair alarm set;with family/visitor present;Other (comment) (daughter present but on phone call in bathroom) Nurse Communication: Mobility status;Need for lift equipment;Precautions PT Visit Diagnosis: Muscle weakness (generalized) (M62.81);Pain;Other abnormalities of gait and mobility (R26.89);Difficulty in walking, not elsewhere classified (R26.2) Pain - Right/Left: Right Pain - part of body: Leg     Time: 6599-3570 PT Time Calculation (min) (ACUTE ONLY): 39 min  Charges:  $Therapeutic Activity: 23-37 mins                     Tifani Dack P., PTA Acute Rehabilitation Services Secure Chat Preferred 9a-5:30pm Office: [place] 07/09/2022, 12:17 PM

## 2022-07-09 NOTE — Progress Notes (Signed)
    Durable Medical Equipment  (From admission, onward)           Start     Ordered   07/06/22 1457  For home use only DME 3 n 1  Once        07/06/22 1459   07/06/22 1457  For home use only DME standard manual wheelchair with seat cushion  Once       Comments: Patient suffers from femur fracture which impairs their ability to perform daily activities like toileting in the home.  A walker will not resolve issue with performing activities of daily living. A wheelchair will allow patient to safely perform daily activities. Patient can safely propel the wheelchair in the home or has a caregiver who can provide assistance. Length of need Lifetime. Accessories: elevating leg rests (ELRs), wheel locks, extensions and anti-tippers.   07/06/22 1459   07/06/22 1453  For home use only DME Hospital bed  Once       Question Answer Comment  Length of Need Lifetime   Patient has (list medical condition): femur fracture nwb   The above medical condition requires: Patient requires the ability to reposition frequently   Head must be elevated greater than: 30 degrees   Bed type Semi-electric   Hoyer Lift Yes   Support Surface: Gel Overlay      07/06/22 1459

## 2022-07-09 NOTE — Progress Notes (Signed)
Nutrition Follow-up  DOCUMENTATION CODES:   Not applicable  INTERVENTION:  Continue Regular diet as ordered  Continue Nepro Shake po BID, each supplement provides 425 kcal and 19 grams protein  NUTRITION DIAGNOSIS:   Increased nutrient needs related to hip fracture as evidenced by estimated needs.  Ongoing  GOAL:   Patient will meet greater than or equal to 90% of their needs  Goal unmet  MONITOR:   PO intake, Supplement acceptance, Labs, Weight trends  REASON FOR ASSESSMENT:   Consult Assessment of nutrition requirement/status  ASSESSMENT:   88 y.o. female with medical history of HTN, type 2 DM, stage IV CKD, dementia, diastolic CHF, GERD, and dementia. She was on vacation with her daughter in [place] when she suffered a mechanical fall. Wednesday night she went out to her balcony and missed a step and fell down. Her daughter found her on her back with her right leg shortened and turned outwards. She was taken to the hospital and then transferred to a trauma hospital on Thursday.  The ortho surgeons in [place] did say it was complex fracture and didn't think it was safe to travel home. She has been on lovenox and last dose given at 9:30AM. (Prophylactic dose). Her daughter medi vaced her out today and travel time was 3 hours and 6 minutes. There is no paper chart.  12/5 s/p surgical repair of R femur fracture  Pt is medically stable for d/c. TOC working on DME order and [place] services.   Pt working with therapies x2 attempts to follow up.   Spoke with RN who reports pt was eating really well initially but has since declined in her PO intake. She reports pt states she does not want to eat d/t fear of incontinence. She is drinking Nepro supplements however not all at once.   Meal completions: 12/4: 100% breakfast 12/8: 33% breakfast, 0% dinner 12/9: 75% breakfast 12/10: 0% breakfast, 0% dinner  No updated weight since 12/04 to assess for weight changes.    Medications: colace (not given today), SSI 0-9 units TID, remeron, rena-vit, protonix  Labs: BUN 34, Cr 1.81, GFR 26, CBG's 131-153 x24 hours  Diet Order:   Diet Order             Diet regular Room service appropriate? Yes; Fluid consistency: Thin  Diet effective now                   EDUCATION NEEDS:   Not appropriate for education at this time  Skin:  Skin Assessment: Reviewed RN Assessment (R leg incision)  Last BM:  12/10 (type 6 x2)  Height:   Ht Readings from Last 1 Encounters:  06/30/22 '5\' 1"'$  (1.549 m)    Weight:   Wt Readings from Last 1 Encounters:  03/16/22 70.8 kg    Ideal Body Weight:  47.7 kg  BMI:  Body mass index is 25.08 kg/m.  Estimated Nutritional Needs:   Kcal:  1475-1700 kcal  Protein:  70-85 grams  Fluid:  >/= 1.6 L/day  Clayborne Dana, RDN, LDN Clinical Nutrition

## 2022-07-09 NOTE — TOC Initial Note (Addendum)
Transition of Care [facility]) - Initial/Assessment Note    Patient Details  Name: [name] MRN: [id number] Date of Birth: Sep 17, 1928  Transition of Care [facility]) CM/SW Contact:    Laura Crews, RN Phone Number: [phone] 07/09/2022, 11:16 AM  Clinical Narrative:                  Spoke with patient's daughter, [name], at the bedside to discuss post acute transition. PTA patient was independent with adls. [name] wants to have her mother at home, but she needs to have DME and [place] services in place.   Discussed potential PCS hours, 20 hours, available through [facility] following a hospitalization. Discussed HH needs for PT, OT, Aide. Daughter discussed that her mom's need has been assessed for an aide every day, so that is what she should get. Discussed [place] agency typically provides services 2-3 times a week, but SNF provides services 5-6 days/week. [name] states that if she is assessed for SNF level need that she should receive this level of care at home.   [name] also discussed that PT has not yet gotten her out of bed and has only dangled her on side of bed. [name] would like to see more therapy before transition home. Discussed [place] agency choice - no preference. [name] able to accept referral for PT, OT, Aide, and will request authorization for [name] services through insurance company.   Daughter stated that she is researching [facility] and has contact information.   Clarified DME needs - hospital bed including hoyer, BSC, and wheelchair. Referral to [facility] - notified that patient received BSC in 2022 and is not eligible at this time. [name] stated that the room at home has been cleared for hospital bed. [facility] to arrange for delivery.   Patient will need ambulance transport home at discharge d/t nonweightbearing status - verified home address and [name]'s cell phone number. TOC following for transition needs.   UPDATE: Spoke with representative at [facility]. Requested authorization  for nonemergent ambulance transport. Authorization currently pending.   Expected Discharge Plan: [place] Barriers to Discharge: Family Issues   Patient Goals and CMS Choice Patient states their goals for this hospitalization and ongoing recovery are:: to have patient return home with assistance of care services needed CMS Medicare.gov Compare Post Acute Care list provided to:: Patient Represenative (must comment) [name]) Choice offered to / list presented to : Patient  Expected Discharge Plan and Services Expected Discharge Plan: [place] In-house Referral: NA Discharge Planning Services: CM Consult Post Acute Care Choice: [place] arrangements for the past 2 months: [place]                 DME Arranged: Hospital bed, Wheelchair manual DME Agency: [facility] Date DME Agency Contacted: 07/09/22 Time DME Agency Contacted: [phone] Representative spoke with at DME Agency: [name] HH Arranged: PT, OT, Nurse's Aide [place] Agency: [name] Date [place]: 07/09/22 Time [place]: 1112 Representative spoke with at [place]  Prior Living Arrangements/Services Living arrangements for the past 2 months: [place] with:: Adult Children Patient language and need for interpreter reviewed:: Yes        Need for Family Participation in Patient Care: Yes (Comment) Care giver support system in place?: Yes (comment) Current home services: Other (comment) (pt has sitter for when daughter works from the office) Criminal Activity/Legal Involvement Pertinent to Current Situation/Hospitalization: No -  Comment as needed  Activities of Daily Living Home Assistive Devices/Equipment: Environmental consultant (specify type), Cane (specify quad or straight), Dentures (specify type), CBG Meter, Blood pressure cuff (rolling) ADL Screening (condition at time of admission) Patient's cognitive ability adequate to  safely complete daily activities?: No Is the patient deaf or have difficulty hearing?: No Does the patient have difficulty seeing, even when wearing glasses/contacts?: No Does the patient have difficulty concentrating, remembering, or making decisions?: Yes Patient able to express need for assistance with ADLs?: Yes Does the patient have difficulty dressing or bathing?: No Independently performs ADLs?: Yes (appropriate for developmental age) Does the patient have difficulty walking or climbing stairs?: Yes Weakness of Legs: None Weakness of Arms/Hands: Right  Permission Sought/Granted Permission sought to share information with : Family Supports Permission granted to share information with : Yes, Verbal Permission Granted  Share Information with NAME: [name]     Permission granted to share info w Relationship: daughter  Permission granted to share info w Contact Information: [phone]  Emotional Assessment Appearance:: Appears stated age Attitude/Demeanor/Rapport: Crying (having abdominal pain) Affect (typically observed): Accepting Orientation: : Oriented to Self Alcohol / Substance Use: Not Applicable Psych Involvement: No (comment)  Admission diagnosis:  Closed fracture of right distal femur ([place]) [S72.401A] Patient Active Problem List   Diagnosis Date Noted   Closed fracture of right distal femur ([place]) 06/30/2022   Chronic diastolic CHF (congestive heart failure) ([place]) 06/30/2022   history of seizures 06/30/2022   Acute respiratory failure with hypoxia ([place]) 06/30/2022   Acute on chronic anemia 06/30/2022   Pneumonia due to COVID-19 virus 03/10/2022   Dementia ([place]) 03/10/2022   Heart failure ([place]) 03/10/2022   AKI (acute kidney injury) ([place]) 03/10/2022   Acute renal failure superimposed on stage 4 chronic kidney disease ([place]) 03/10/2022   Type 2 diabetes mellitus with complication, without long-term current use of insulin ([place])    Glaucoma    HHNC (hyperglycemic  hyperosmolar nonketotic coma) ([place]) 11/24/2020   Hyperglycemia 11/24/2020   UTI (urinary tract infection) 03/50/0938   Acute metabolic encephalopathy 18/29/9371   Hypokalemia 11/24/2020   GERD (gastroesophageal reflux disease)    Hypertension    PCP:  Seward Carol, MD Pharmacy:   [facility], [place] [address] [place] [address] Phone: [phone] Fax: [phone]     Social Determinants of Health (SDOH) Interventions    Readmission Risk Interventions     No data to display

## 2022-07-09 NOTE — Progress Notes (Signed)
    Durable Medical Equipment  (From admission, onward)           Start     Ordered   07/09/22 1017  For home use only DME 3 n 1  Once       Comments: Patient unable to bear weight on one leg and requires hoyer transfer to the commode at this time   07/09/22 1017   07/06/22 1457  For home use only DME standard manual wheelchair with seat cushion  Once       Comments: Patient suffers from femur fracture which impairs their ability to perform daily activities like toileting in the home.  A walker will not resolve issue with performing activities of daily living. A wheelchair will allow patient to safely perform daily activities. Patient can safely propel the wheelchair in the home or has a caregiver who can provide assistance. Length of need Lifetime. Accessories: elevating leg rests (ELRs), wheel locks, extensions and anti-tippers.   07/06/22 1459   07/06/22 1453  For home use only DME Hospital bed  Once       Question Answer Comment  Length of Need Lifetime   Patient has (list medical condition): femur fracture nwb   The above medical condition requires: Patient requires the ability to reposition frequently   Head must be elevated greater than: 30 degrees   Bed type Semi-electric   Hoyer Lift Yes   Support Surface: Gel Overlay      07/06/22 1459

## 2022-07-10 DIAGNOSIS — S72491A Other fracture of lower end of right femur, initial encounter for closed fracture: Secondary | ICD-10-CM | POA: Diagnosis not present

## 2022-07-10 LAB — GLUCOSE, CAPILLARY
Glucose-Capillary: 161 mg/dL — ABNORMAL HIGH (ref 70–99)
Glucose-Capillary: 174 mg/dL — ABNORMAL HIGH (ref 70–99)
Glucose-Capillary: 133 mg/dL — ABNORMAL HIGH (ref 70–99)
Glucose-Capillary: 145 mg/dL — ABNORMAL HIGH (ref 70–99)

## 2022-07-10 NOTE — Progress Notes (Signed)
Physical Therapy Treatment Patient Details Name: [name] MRN: [id number] DOB: [birth year] Today's Date: 07/10/2022   History of Present Illness Pt is a 88 y/o F admitted on 06/30/22. Pt was on vacation with her daughter in [place] when she fell & was taken to hospital. Her daughter [name] vaced her out. Pt found to have R complex distal femur fx. Pt underwent ORIF of distal femur fx by Dr. Marcelino Scot on 07/03/22. PMH: HTN, DM2, CKD Stage IV, dementia, diastolic CHF, GERD, glaucoma.    PT Comments    Pt received in supine, agreeable to therapy session with encouragement, daughter present and encouraging her. Pt needing up to modA +2 for rolling to L/R sides multiple reps during hygiene assist and placement of lift pad, pt daughter receptive to training on technique for this. Pt with improved comfort once in chair and agreeable to sit up in chair 1-2 hours, pressure relief geomat cushion under her in chair for skin protection. Some skin breakdown on pt bottom, RN notified, may need additional foam dressings replaced once back to supine. Pt somewhat lethargic this afternoon so defer squatting/seated scooting at EOB, may trial this more next session if pt more alert. Daughter reports she has been having pt perform HEP daily. Pt continues to benefit from PT services to progress toward functional mobility goals.    Recommendations for follow up therapy are one component of a multi-disciplinary discharge planning process, led by the attending physician.  Recommendations may be updated based on patient status, additional functional criteria and insurance authorization.  Follow Up Recommendations  Skilled nursing-short term rehab (<3 hours/day) Can patient physically be transported by private vehicle: No   Assistance Recommended at Discharge Frequent or constant Supervision/Assistance  Patient can return home with the following Two people to help with walking and/or transfers;Two people to help with  bathing/dressing/bathroom;Help with stairs or ramp for entrance;Direct supervision/assist for medications management;Assist for transportation;Assistance with feeding;Direct supervision/assist for financial management;Assistance with Education officer, environmental (measurements PT);Wheelchair cushion (measurements PT);Rolling walker (2 wheels);BSC/3in1;Hospital bed;Other (comment) (mechanical lift and sling)    Recommendations for Other Services       Precautions / Restrictions Precautions Precautions: Fall Precaution Comments: seems to be more anxious at night Restrictions Weight Bearing Restrictions: Yes RLE Weight Bearing: Non weight bearing     Mobility  Bed Mobility Overal bed mobility: Needs Assistance Bed Mobility: Rolling Rolling: Min assist, +2 for safety/equipment, Mod assist         General bed mobility comments: Rolled for placement of lift pad and hygiene assist with min assist to mod assist using bed rail; when more drowsy needs modA. Rolled ~6 reps total (3 to each side) prior to mechanical lift OOB to chair Patient Response: Flat affect, Cooperative  Transfers Overall transfer level: Needs assistance   Transfers: Bed to chair/wheelchair/BSC             General transfer comment: use of maximove with support of R LE Transfer via Lift Equipment: Maximove  Ambulation/Gait                       Balance Overall balance assessment: Needs assistance Sitting-balance support: Bilateral upper extremity supported, Feet supported Sitting balance-Leahy Scale: Fair Sitting balance - Comments: sitting upright in chair fair for <1 minute but pt limited due to lethargy so defer dynamic seated tasks in chair until next session       Standing balance comment: pt unable this session  Cognition Arousal/Alertness: Lethargic Behavior During Therapy: Flat affect (drowsy) Overall Cognitive  Status: History of cognitive impairments - at baseline Area of Impairment: Following commands, Awareness, Safety/judgement, Problem solving, Orientation, Memory, Attention                 Orientation Level: Disoriented to, Place, Time, Situation Current Attention Level: Focused Memory: Decreased recall of precautions, Decreased short-term memory Following Commands: Follows one step commands with increased time, Follows multi-step commands inconsistently Safety/Judgement: Decreased awareness of safety, Decreased awareness of deficits Awareness: Intellectual Problem Solving: Requires verbal cues, Requires tactile cues, Slow processing, Difficulty sequencing General Comments: Daughter [name] present and encouraging patient during session and receptive to instruction on use of hoyer lift and placement of lift pads. Daughter aware of the complexity of her self-care and mobility needs if she is to go home. Pt does need increased time and some multimodal cues for proper follow-through of instructions but appears to be tolerating bed mobility and mechanical lift transfers well getting up to the chair.        Exercises Other Exercises Other Exercises: supine BLE AROM: heel slides (AA on RLE), ankle pumps, hip abduction (5 reps only, AA on R side), knee flex/ext while sidelying for hygiene 3-5 reps ea (AA),  x10 reps ea all others    General Comments General comments (skin integrity, edema, etc.): HR WFL, SpO2 WFL per chart review and no acute s/sx distress or dyspnea      Pertinent Vitals/Pain Pain Assessment Pain Assessment: PAINAD Breathing: normal Negative Vocalization: occasional moan/groan, low speech, negative/disapproving quality Facial Expression: smiling or inexpressive Body Language: relaxed Consolability: distracted or reassured by voice/touch PAINAD Score: 2 Pain Location: R LE with rolling and when R knee bent/in dependent position Pain Descriptors / Indicators: Grimacing,  Discomfort, Operative site guarding, Sore Pain Intervention(s): Monitored during session, Limited activity within patient's tolerance, Premedicated before session, Repositioned     PT Goals (current goals can now be found in the care plan section) Acute Rehab PT Goals Patient Stated Goal: Per daughter [name], decreased pain, to be able to transfer more independently, to go home PT Goal Formulation: With patient/family Time For Goal Achievement: 07/18/22 Progress towards PT goals: Progressing toward goals    Frequency    Min 3X/week      PT Plan Current plan remains appropriate    Co-evaluation              AM-PAC PT "6 Clicks" Mobility   Outcome Measure  Help needed turning from your back to your side while in a flat bed without using bedrails?: A Lot Help needed moving from lying on your back to sitting on the side of a flat bed without using bedrails?: A Lot Help needed moving to and from a bed to a chair (including a wheelchair)?: Total Help needed standing up from a chair using your arms (e.g., wheelchair or bedside chair)?: Total Help needed to walk in hospital room?: Total Help needed climbing 3-5 steps with a railing? : Total 6 Click Score: 8    End of Session Equipment Utilized During Treatment: Other (comment) (mechanical lift and bed pads) Activity Tolerance: Patient tolerated treatment well;Patient limited by lethargy Patient left: in chair;with call bell/phone within reach;with chair alarm set;with family/visitor present;Other (comment) (heels floated, pillows for pressure relief and pressure redistribution geomat cushion in chair under her, RN aware; no purewick order from MD so washcloth (dry) between her legs to absorb if pt has any mild incontinence) Nurse Communication: Mobility  status;Need for lift equipment;Precautions;Other (comment) (needs additional foam dressing placed for skin breakdown on lower buttocks once returned to bed from chair; has  pressure relief cushion in chair) PT Visit Diagnosis: Muscle weakness (generalized) (M62.81);Pain;Other abnormalities of gait and mobility (R26.89);Difficulty in walking, not elsewhere classified (R26.2) Pain - Right/Left: Right Pain - part of body: Leg     Time: 1720-9106 PT Time Calculation (min) (ACUTE ONLY): 42 min  Charges:  $Therapeutic Exercise: 8-22 mins $Therapeutic Activity: 23-37 mins                     Chani Ghanem P., PTA Acute Rehabilitation Services Secure Chat Preferred 9a-5:30pm Office: [place] 07/10/2022, 4:07 PM

## 2022-07-10 NOTE — Progress Notes (Signed)
PROGRESS NOTE    [name]  ERD:[id number] DOB: Jun 05, 1929 DOA: 06/30/2022 PCP: Seward Carol, MD   Brief Narrative:  88 year old female with medical history of hypertension, diabetes mellitus type 2, stage IV CKD, dementia, diastolic CHF, GERD who was on vacation with her daughter in [place] when she suffered a mechanical fall.  Patient went to balcony when she missed a step and fell down.  Her daughter found her on her back with right leg shortened and on outwards.  She was transferred to hospital.  Also surgeons at [place] told her that was a complex fracture and did not think it was safe to travel home.  Her daughter medi vaced her out today and travel time was 3 hours and 6 minutes. There is no paper chart.  During hospitalization she was found to have right distal femur periprosthetic fracture, acute anemia and off-and-on agitation/hallucination. Underwent OR today, Renal func is improving.    Assessment & Plan:  Principal Problem:   Closed fracture of right distal femur ([place]) Active Problems:   Acute respiratory failure with hypoxia (HCC)   Acute renal failure superimposed on stage 4 chronic kidney disease (HCC)   Acute on chronic anemia   Chronic diastolic CHF (congestive heart failure) (HCC)   Type 2 diabetes mellitus with complication, without long-term current use of insulin (HCC)   Hypertension   GERD (gastroesophageal reflux disease)   history of seizures   Dementia ([place])   Right distal femur periprosthetic fracture s/p mechanical fall: -Sustained fall in [place].  S/p surgical repair by Dr. Marcelino Scot on 07/03/2022.  Pain is now controlled, she appears comfortable. -CT head negative.  PT OT has recommended SNF but initially daughter had declined that and she wanted the patient to have daily physical therapy in the hospital for 4 to 5 days so she can take her home on Monday but when I spoke to her on Monday, she told me that she had not received any DME and that she  was not ready to take her home and then suddenly afternoon on Monday, she change her mind and agreed for the patient to go to SNF.  TOC started workup on that.  Patient has a bed but we are waiting for insurance authorization.   Acute hypoxemic respiratory failure: - Elevated D-dimer, likely from atelectasis.  Low suspicion for PE.  Currently saturating 100% on 2 L.  Perhaps she does not need that.  Aggressive use of incentive spirometer and flutter valve.  Procalcitonin is negative, BNP 309   Iron deficiency anemia/acute on chronic anemia -Baseline hemoglobin 9, admission hemoglobin less than 7.  Status post transfusion, hemoglobin stable ever since.   Acute kidney injury on CKD stage 4. -Patient's baseline creatinine 1.47 as of 03/15/2022.  Admission creatinine 4.45, creatinine continues to improve with IV fluids.  This morning 1.78 today.  Nephrology signed off on 07/04/2022.  UTI: Patient was started on Rocephin for UTI.  Urine culture is growing E. coli and Klebsiella oxytoca.  Will complete 5 days of Rocephin.   History of seizures: -Has been seizure-free for many years.  Continue Keppra.    Dementia without behavior disturbance -Continue Remeron, Seroquel nightly   Diabetes mellitus type 2 -Hemoglobin A1c 6.4.  Blood sugar controlled. -Sliding scale and Accu-Cheks  Hypomagnesemia: Resolved.  DVT prophylaxis: Subcu heparin Code Status: Full code Family Communication: Daughter at bedside. Status is: Inpatient Remains inpatient appropriate because: Waiting for SNF placement.  Nutritional status    Signs/Symptoms:  estimated needs  Interventions: Nepro shake, MVI  Body mass index is 25.08 kg/m.         Subjective:   Patient seen and examined.  She was alert and oriented to self and place as usual due to advanced dementia.  Also, as usual she complained of not feeling well but could not, with any specific complaint.  Examination:  General exam: Appears calm and  comfortable  Respiratory system: Clear to auscultation. Respiratory effort normal. Cardiovascular system: S1 & S2 heard, RRR. No JVD, murmurs, rubs, gallops or clicks. No pedal edema. Gastrointestinal system: Abdomen is nondistended, soft and nontender. No organomegaly or masses felt. Normal bowel sounds heard. Central nervous system: Alert and oriented x 2. No focal neurological deficits. Extremities: Symmetric 5 x 5 power. Skin: No rashes, lesions or ulcers.    Objective: Vitals:   07/09/22 2158 07/10/22 0407 07/10/22 0541 07/10/22 0732  BP:  (!) 164/71 (!) 176/70 (!) 168/67  Pulse: 91   80  Resp:    19  Temp:    98.3 F (36.8 C)  TempSrc:    Oral  SpO2:    94%  Weight:      Height:        Intake/Output Summary (Last 24 hours) at 07/10/2022 1319 Last data filed at 07/10/2022 0600 Gross per 24 hour  Intake --  Output 450 ml  Net -450 ml    Filed Weights   07/01/22 2215 07/02/22 0440  Weight: 60.2 kg 60.2 kg     Data Reviewed:   CBC: Recent Labs  Lab 07/05/22 0413 07/06/22 0625 07/07/22 0231 07/08/22 0336 07/09/22 0359  WBC 9.1 8.6 8.0 8.2 7.7  HGB 9.4* 9.4* 9.2* 9.2* 9.1*  HCT 28.6* 28.3* 28.9* 27.6* 28.9*  MCV 88.5 90.1 91.5 90.2 92.3  PLT 223 233 259 282 409    Basic Metabolic Panel: Recent Labs  Lab 07/05/22 0413 07/06/22 0625 07/07/22 0231 07/08/22 0336 07/09/22 0359  NA 136 134* 136 139 138  K 4.1 4.0 3.9 3.8 4.2  CL 105 102 103 104 107  CO2 21* 22 22 21* 19*  GLUCOSE 123* 136* 115* 115* 116*  BUN 33* 34* 36* 35* 34*  CREATININE 1.89* 1.88* 1.97* 1.78* 1.81*  CALCIUM 9.2 8.8* 8.9 8.9 9.0  MG 2.0 1.8 1.8 1.8 1.7    GFR: Estimated Creatinine Clearance: 16.9 mL/min (A) (by C-G formula based on SCr of 1.81 mg/dL (H)). Liver Function Tests: No results for input(s): "AST", "ALT", "ALKPHOS", "BILITOT", "PROT", "ALBUMIN" in the last 168 hours.  No results for input(s): "LIPASE", "AMYLASE" in the last 168 hours. No results for input(s):  "AMMONIA" in the last 168 hours. Coagulation Profile: No results for input(s): "INR", "PROTIME" in the last 168 hours.  Cardiac Enzymes: No results for input(s): "CKTOTAL", "CKMB", "CKMBINDEX", "TROPONINI" in the last 168 hours.  BNP (last 3 results) No results for input(s): "PROBNP" in the last 8760 hours. HbA1C: No results for input(s): "HGBA1C" in the last 72 hours. CBG: Recent Labs  Lab 07/09/22 1138 07/09/22 1639 07/09/22 2108 07/10/22 0730 07/10/22 1128  GLUCAP 153* 166* 126* 161* 174*    Lipid Profile: No results for input(s): "CHOL", "HDL", "LDLCALC", "TRIG", "CHOLHDL", "LDLDIRECT" in the last 72 hours. Thyroid Function Tests: No results for input(s): "TSH", "T4TOTAL", "FREET4", "T3FREE", "THYROIDAB" in the last 72 hours. Anemia Panel: No results for input(s): "VITAMINB12", "FOLATE", "FERRITIN", "TIBC", "IRON", "RETICCTPCT" in the last 72 hours.  Sepsis Labs: No results for input(s): "PROCALCITON", "LATICACIDVEN" in  the last 168 hours.   Recent Results (from the past 240 hour(s))  Urine Culture     Status: Abnormal   Collection Time: 07/02/22 10:15 AM   Specimen: Urine, Clean Catch  Result Value Ref Range Status   Specimen Description URINE, CLEAN CATCH  Final   Special Requests   Final    NONE Performed at [facility], [address] [place], [place] [address]    Culture (A)  Final    >=100,000 COLONIES/mL ESCHERICHIA COLI >=100,000 COLONIES/mL KLEBSIELLA OXYTOCA    Report Status 07/04/2022 FINAL  Final   Organism ID, Bacteria ESCHERICHIA COLI (A)  Final   Organism ID, Bacteria KLEBSIELLA OXYTOCA (A)  Final      Susceptibility   Escherichia coli - MIC*    AMPICILLIN 8 SENSITIVE Sensitive     CEFAZOLIN <=4 SENSITIVE Sensitive     CEFEPIME <=0.12 SENSITIVE Sensitive     CEFTRIAXONE <=0.25 SENSITIVE Sensitive     CIPROFLOXACIN <=0.25 SENSITIVE Sensitive     GENTAMICIN <=1 SENSITIVE Sensitive     IMIPENEM <=0.25 SENSITIVE Sensitive      NITROFURANTOIN <=16 SENSITIVE Sensitive     TRIMETH/SULFA <=20 SENSITIVE Sensitive     AMPICILLIN/SULBACTAM <=2 SENSITIVE Sensitive     PIP/TAZO <=4 SENSITIVE Sensitive     * >=100,000 COLONIES/mL ESCHERICHIA COLI   Klebsiella oxytoca - MIC*    AMPICILLIN RESISTANT Resistant     CEFAZOLIN <=4 SENSITIVE Sensitive     CEFEPIME <=0.12 SENSITIVE Sensitive     CEFTRIAXONE <=0.25 SENSITIVE Sensitive     CIPROFLOXACIN <=0.25 SENSITIVE Sensitive     GENTAMICIN <=1 SENSITIVE Sensitive     IMIPENEM <=0.25 SENSITIVE Sensitive     NITROFURANTOIN 32 SENSITIVE Sensitive     TRIMETH/SULFA <=20 SENSITIVE Sensitive     AMPICILLIN/SULBACTAM 4 SENSITIVE Sensitive     PIP/TAZO <=4 SENSITIVE Sensitive     * >=100,000 COLONIES/mL KLEBSIELLA OXYTOCA         Radiology Studies: No results found.      Scheduled Meds:  sodium chloride   Intravenous Once   acetaminophen  650 mg Oral Q6H   allopurinol  100 mg Oral BID   amLODipine  10 mg Oral Daily   Chlorhexidine Gluconate Cloth  6 each Topical Daily   docusate sodium  100 mg Oral BID   feeding supplement (NEPRO CARB STEADY)  237 mL Oral BID BM   heparin injection (subcutaneous)  5,000 Units Subcutaneous Q8H   hydrALAZINE  25 mg Oral Q8H   insulin aspart  0-9 Units Subcutaneous TID WC   levETIRAcetam  500 mg Oral q AM   metoprolol tartrate  100 mg Oral BID   mirtazapine  15 mg Oral QHS   multivitamin  1 tablet Oral QHS   pantoprazole  40 mg Oral QHS   QUEtiapine  25 mg Oral QHS   Continuous Infusions:  cefTRIAXone (ROCEPHIN)  IV     methocarbamol (ROBAXIN) IV Stopped (07/04/22 0431)     LOS: 10 days   Time spent= 35 mins    Darliss Cheney, MD [facility]  If 7PM-7AM, please contact night-coverage  07/10/2022, 1:19 PM

## 2022-07-10 NOTE — TOC Progression Note (Signed)
Transition of Care [facility]) - Progression Note    Patient Details  Name: [name] MRN: [id number] Date of Birth: May 06, 1929  Transition of Care [facility]) CM/SW Contact  Laura Chars, [name] Phone Number: 07/10/2022, 8:54 AM  Clinical Narrative:   [name] does offer bed.  CSW spoke with pt daughter [name] and confirmed that she does want to accept this offer.  Auth request initiated with [facility].    Expected Discharge Plan: [place] Barriers to Discharge: Family Issues  Expected Discharge Plan and Services Expected Discharge Plan: [place] In-house Referral: NA Discharge Planning Services: CM Consult Post Acute Care Choice: [place] arrangements for the past 2 months: [place]                 DME Arranged: Hospital bed, Wheelchair manual DME Agency: [employer] Date DME Agency Contacted: 07/09/22 Time DME Agency Contacted: [phone] Representative spoke with at DME Agency: [name] HH Arranged: PT, OT, Nurse's Aide [place] Agency: [place] Date [place]: 07/09/22 Time [place]: 1112 Representative spoke with at [place]: [place] Determinants of Health ([place]) Interventions    Readmission Risk Interventions     No data to display

## 2022-07-11 DIAGNOSIS — H409 Unspecified glaucoma: Secondary | ICD-10-CM | POA: Diagnosis not present

## 2022-07-11 DIAGNOSIS — K219 Gastro-esophageal reflux disease without esophagitis: Secondary | ICD-10-CM | POA: Diagnosis not present

## 2022-07-11 DIAGNOSIS — F05 Delirium due to known physiological condition: Secondary | ICD-10-CM | POA: Diagnosis not present

## 2022-07-11 DIAGNOSIS — Z7401 Bed confinement status: Secondary | ICD-10-CM | POA: Diagnosis not present

## 2022-07-11 DIAGNOSIS — F0390 Unspecified dementia without behavioral disturbance: Secondary | ICD-10-CM

## 2022-07-11 DIAGNOSIS — Z1152 Encounter for screening for COVID-19: Secondary | ICD-10-CM | POA: Diagnosis not present

## 2022-07-11 DIAGNOSIS — N281 Cyst of kidney, acquired: Secondary | ICD-10-CM | POA: Diagnosis not present

## 2022-07-11 DIAGNOSIS — R41841 Cognitive communication deficit: Secondary | ICD-10-CM | POA: Diagnosis not present

## 2022-07-11 DIAGNOSIS — I5032 Chronic diastolic (congestive) heart failure: Secondary | ICD-10-CM

## 2022-07-11 DIAGNOSIS — E669 Obesity, unspecified: Secondary | ICD-10-CM | POA: Diagnosis not present

## 2022-07-11 DIAGNOSIS — R410 Disorientation, unspecified: Secondary | ICD-10-CM | POA: Diagnosis not present

## 2022-07-11 DIAGNOSIS — W109XXA Fall (on) (from) unspecified stairs and steps, initial encounter: Secondary | ICD-10-CM | POA: Diagnosis not present

## 2022-07-11 DIAGNOSIS — S72401S Unspecified fracture of lower end of right femur, sequela: Secondary | ICD-10-CM | POA: Diagnosis not present

## 2022-07-11 DIAGNOSIS — Y9259 Other trade areas as the place of occurrence of the external cause: Secondary | ICD-10-CM | POA: Diagnosis not present

## 2022-07-11 DIAGNOSIS — R262 Difficulty in walking, not elsewhere classified: Secondary | ICD-10-CM | POA: Diagnosis not present

## 2022-07-11 DIAGNOSIS — N179 Acute kidney failure, unspecified: Secondary | ICD-10-CM | POA: Diagnosis not present

## 2022-07-11 DIAGNOSIS — M6281 Muscle weakness (generalized): Secondary | ICD-10-CM | POA: Diagnosis not present

## 2022-07-11 DIAGNOSIS — E875 Hyperkalemia: Secondary | ICD-10-CM | POA: Diagnosis not present

## 2022-07-11 DIAGNOSIS — I10 Essential (primary) hypertension: Secondary | ICD-10-CM | POA: Diagnosis not present

## 2022-07-11 DIAGNOSIS — N1330 Unspecified hydronephrosis: Secondary | ICD-10-CM | POA: Diagnosis not present

## 2022-07-11 DIAGNOSIS — L603 Nail dystrophy: Secondary | ICD-10-CM | POA: Diagnosis not present

## 2022-07-11 DIAGNOSIS — Z794 Long term (current) use of insulin: Secondary | ICD-10-CM | POA: Diagnosis not present

## 2022-07-11 DIAGNOSIS — I130 Hypertensive heart and chronic kidney disease with heart failure and stage 1 through stage 4 chronic kidney disease, or unspecified chronic kidney disease: Secondary | ICD-10-CM | POA: Diagnosis not present

## 2022-07-11 DIAGNOSIS — R569 Unspecified convulsions: Secondary | ICD-10-CM | POA: Diagnosis not present

## 2022-07-11 DIAGNOSIS — N184 Chronic kidney disease, stage 4 (severe): Secondary | ICD-10-CM | POA: Diagnosis not present

## 2022-07-11 DIAGNOSIS — E860 Dehydration: Secondary | ICD-10-CM | POA: Diagnosis not present

## 2022-07-11 DIAGNOSIS — I700 Atherosclerosis of aorta: Secondary | ICD-10-CM | POA: Diagnosis not present

## 2022-07-11 DIAGNOSIS — J9601 Acute respiratory failure with hypoxia: Secondary | ICD-10-CM | POA: Diagnosis not present

## 2022-07-11 DIAGNOSIS — R338 Other retention of urine: Secondary | ICD-10-CM | POA: Diagnosis not present

## 2022-07-11 DIAGNOSIS — M6259 Muscle wasting and atrophy, not elsewhere classified, multiple sites: Secondary | ICD-10-CM | POA: Diagnosis not present

## 2022-07-11 DIAGNOSIS — R7881 Bacteremia: Secondary | ICD-10-CM | POA: Diagnosis not present

## 2022-07-11 DIAGNOSIS — N136 Pyonephrosis: Secondary | ICD-10-CM | POA: Diagnosis not present

## 2022-07-11 DIAGNOSIS — R339 Retention of urine, unspecified: Secondary | ICD-10-CM | POA: Diagnosis not present

## 2022-07-11 DIAGNOSIS — D631 Anemia in chronic kidney disease: Secondary | ICD-10-CM | POA: Diagnosis not present

## 2022-07-11 DIAGNOSIS — Z87891 Personal history of nicotine dependence: Secondary | ICD-10-CM | POA: Diagnosis not present

## 2022-07-11 DIAGNOSIS — R531 Weakness: Secondary | ICD-10-CM | POA: Diagnosis not present

## 2022-07-11 DIAGNOSIS — S79929A Unspecified injury of unspecified thigh, initial encounter: Secondary | ICD-10-CM | POA: Diagnosis not present

## 2022-07-11 DIAGNOSIS — E872 Acidosis, unspecified: Secondary | ICD-10-CM | POA: Diagnosis not present

## 2022-07-11 DIAGNOSIS — G9340 Encephalopathy, unspecified: Secondary | ICD-10-CM | POA: Diagnosis not present

## 2022-07-11 DIAGNOSIS — G9341 Metabolic encephalopathy: Secondary | ICD-10-CM | POA: Diagnosis not present

## 2022-07-11 DIAGNOSIS — N3080 Other cystitis without hematuria: Secondary | ICD-10-CM | POA: Diagnosis not present

## 2022-07-11 DIAGNOSIS — E1122 Type 2 diabetes mellitus with diabetic chronic kidney disease: Secondary | ICD-10-CM | POA: Diagnosis not present

## 2022-07-11 DIAGNOSIS — M19011 Primary osteoarthritis, right shoulder: Secondary | ICD-10-CM | POA: Diagnosis not present

## 2022-07-11 DIAGNOSIS — Z23 Encounter for immunization: Secondary | ICD-10-CM | POA: Diagnosis not present

## 2022-07-11 DIAGNOSIS — E118 Type 2 diabetes mellitus with unspecified complications: Secondary | ICD-10-CM | POA: Diagnosis not present

## 2022-07-11 DIAGNOSIS — G40909 Epilepsy, unspecified, not intractable, without status epilepticus: Secondary | ICD-10-CM | POA: Diagnosis not present

## 2022-07-11 DIAGNOSIS — S7291XD Unspecified fracture of right femur, subsequent encounter for closed fracture with routine healing: Secondary | ICD-10-CM | POA: Diagnosis not present

## 2022-07-11 DIAGNOSIS — N178 Other acute kidney failure: Secondary | ICD-10-CM | POA: Diagnosis not present

## 2022-07-11 DIAGNOSIS — Z8781 Personal history of (healed) traumatic fracture: Secondary | ICD-10-CM | POA: Diagnosis not present

## 2022-07-11 DIAGNOSIS — Z4789 Encounter for other orthopedic aftercare: Secondary | ICD-10-CM | POA: Diagnosis not present

## 2022-07-11 DIAGNOSIS — R4182 Altered mental status, unspecified: Secondary | ICD-10-CM | POA: Diagnosis not present

## 2022-07-11 DIAGNOSIS — Z741 Need for assistance with personal care: Secondary | ICD-10-CM | POA: Diagnosis not present

## 2022-07-11 DIAGNOSIS — M19012 Primary osteoarthritis, left shoulder: Secondary | ICD-10-CM | POA: Diagnosis not present

## 2022-07-11 DIAGNOSIS — B9689 Other specified bacterial agents as the cause of diseases classified elsewhere: Secondary | ICD-10-CM | POA: Diagnosis not present

## 2022-07-11 DIAGNOSIS — S72491A Other fracture of lower end of right femur, initial encounter for closed fracture: Secondary | ICD-10-CM | POA: Diagnosis not present

## 2022-07-11 LAB — GLUCOSE, CAPILLARY
Glucose-Capillary: 159 mg/dL — ABNORMAL HIGH (ref 70–99)
Glucose-Capillary: 148 mg/dL — ABNORMAL HIGH (ref 70–99)

## 2022-07-11 MED ORDER — METHOCARBAMOL 500 MG PO TABS: 500.0000 mg | ORAL_TABLET | Freq: Four times a day (QID) | ORAL | 0 refills | Status: DC | PRN

## 2022-07-11 MED ORDER — APIXABAN 2.5 MG PO TABS: 2.5000 mg | ORAL_TABLET | Freq: Two times a day (BID) | ORAL | 0 refills | Status: DC

## 2022-07-11 MED ORDER — OXYCODONE HCL 5 MG PO TABS: 5.0000 mg | ORAL_TABLET | Freq: Four times a day (QID) | ORAL | 0 refills | Status: DC | PRN

## 2022-07-11 MED ORDER — DOCUSATE SODIUM 100 MG PO CAPS: 100.0000 mg | ORAL_CAPSULE | Freq: Two times a day (BID) | ORAL | 0 refills | Status: DC

## 2022-07-11 NOTE — Progress Notes (Signed)
Received this patient at 3013HY. Pt is resting quietly w/ eyes closed. Will continue to monitor delirium and BP.

## 2022-07-11 NOTE — TOC Progression Note (Addendum)
Transition of Care [facility]) - Progression Note    Patient Details  Name: [name] MRN: [id number] Date of Birth: June 03, 1929  Transition of Care [facility]) CM/SW Contact  Joanne Chars, LCSW Phone Number: 07/11/2022, 10:02 AM  Clinical Narrative:   SNF auth approved: 7 days, [id number].    PTAR: [id number] MD informed.  1215: [facility] confirms they are ready to receive pt.    Expected Discharge Plan: [place] Barriers to Discharge: Family Issues  Expected Discharge Plan and Services Expected Discharge Plan: [place] In-house Referral: NA Discharge Planning Services: CM Consult Post Acute Care Choice: [place] arrangements for the past 2 months: [place]                 DME Arranged: Hospital bed, Wheelchair manual DME Agency: [employer] Date DME Agency Contacted: 07/09/22 Time DME Agency Contacted: [phone] Representative spoke with at DME Agency: [name] HH Arranged: PT, OT, Nurse's Aide [place] Agency: [place] Date [place]: 07/09/22 Time [place]: 1112 Representative spoke with at [place]: [place] Determinants of Health ([place]) Interventions    Readmission Risk Interventions     No data to display

## 2022-07-11 NOTE — Progress Notes (Signed)
Attempted to give pt Labetalol '10mg'$  PRN for diastolic 081 and above - for BP 160/62, however when I was about to infused it-noted that iv site was malpositioned iv med leaked out and it  was wasted. Attempted to restart a new IV site ut it was difficult to find a good vein. Will place an IV consult

## 2022-07-11 NOTE — TOC Transition Note (Signed)
Transition of Care [facility]) - CM/SW Discharge Note   Patient Details  Name: [name] MRN: [id number] Date of Birth: March 04, 1929  Transition of Care [facility]) CM/SW Contact:  Joanne Chars, LCSW Phone Number: 07/11/2022, 12:43 PM   Clinical Narrative:   Pt discharging to [facility], room 124.  RN call report to [phone].      Final next level of care: Skilled Nursing Facility Barriers to Discharge: Barriers Resolved   Patient Goals and CMS Choice Patient states their goals for this hospitalization and ongoing recovery are:: to have patient return home with assistance of care services needed CMS Medicare.gov Compare Post Acute Care list provided to:: Patient Represenative (must comment) [name]) Choice offered to / list presented to : Patient  Discharge Placement              Patient chooses bed at:  [facility]) Patient to be transferred to facility by: [place] Name of family member notified: daughter [name] Patient and family notified of of transfer: 07/11/22  Discharge Plan and Services In-house Referral: NA Discharge Planning Services: CM Consult Post Acute Care Choice: Home Health          DME Arranged: Hospital bed, Wheelchair manual DME Agency: [employer] Date DME Agency Contacted: 07/09/22 Time DME Agency Contacted: [phone] Representative spoke with at DME Agency: [name] HH Arranged: PT, OT, Nurse's Aide [name] Agency: [name] Date [place]: 07/09/22 Time [place]: 1112 Representative spoke with at [place]: [name] Determinants of Health ([place]) Interventions     Readmission Risk Interventions     No data to display

## 2022-07-11 NOTE — Discharge Summary (Signed)
Physician Discharge Summary  [name] SWF:[id number] DOB: 1929-01-31 DOA: 06/30/2022  PCP: Seward Carol, MD  Admit date: 06/30/2022 Discharge date: 07/11/2022  Admitted From: Home Disposition: SNF  Recommendations for Outpatient Follow-up:  Follow up with SNF provider at earliest convenience Outpatient follow-up with orthopedics.  Wound care/discharge pain management and DVT prophylaxis regimen as per orthopedics Recommend outpatient evaluation and follow-up by palliative care for goals of care discussion Follow up in ED if symptoms worsen or new appear   Home Health: No Equipment/Devices: None  Discharge Condition: Guarded CODE STATUS: Full Diet recommendation: Heart healthy  Brief/Interim Summary: 88 year old female with medical history of hypertension, diabetes mellitus type 2, stage IV CKD, dementia, diastolic CHF, GERD who was on vacation with her daughter in [place] when she suffered a mechanical fall.  She was advised for surgical treatment in [place] but daughter brought her to the distress for further treatment.  She was found to have right distal femoral preprostatic fracture, acute anemia and on and off agitation/hallucination.  She underwent surgical repair by Dr. Mallie Mussel on 12 5 currently.  PT recommended SNF placement.  She also completed treatment with Rocephin for UTI.  She is currently medically stable for discharge to SNF.  She will be discharged to SNF once bed is available.  Discharge Diagnoses:   Right distal femur periprosthetic fracture s/p mechanical fall: -Sustained fall in [place].  S/p surgical repair by Dr. Marcelino Scot on 07/03/2022.  Pain is now controlled, she appears comfortable. -CT head negative.   -PT recommended SNF placement.   - She is currently medically stable for discharge to SNF.  She will be discharged to SNF once bed is available.   Acute hypoxemic respiratory failure: - Elevated D-dimer, likely from atelectasis.  Low suspicion for  PE.   -Respiratory status has much improved.  Currently on room air.  Aggressive use of incentive spirometer and flutter valve.  Procalcitonin is negative, BNP was 309   Iron deficiency anemia/acute on chronic anemia -Baseline hemoglobin 9, admission hemoglobin less than 7.  Status post transfusion, hemoglobin stable ever since.  Monitor intermittently as an outpatient.   Acute kidney injury on CKD stage 4 Acute metabolic acidosis -Patient's baseline creatinine 1.47 as of 03/15/2022.  Admission creatinine 4.45, creatinine improved with IV fluids.  Creatinine 1.81 on 07/09/2022.  Nephrology signed off on 07/04/2022. -Outpatient follow-up of BMP.   UTI: Patient was started on Rocephin for UTI.  Urine culture grew E. coli and Klebsiella oxytoca.  Completed a course of Rocephin.   History of seizures: -Has been seizure-free for many years.  Continue Keppra.     Dementia without behavior disturbance -Continue Remeron, Seroquel nightly -Recommend outpatient evaluation and follow-up by palliative care for goals of care discussion   Diabetes mellitus type 2 -Hemoglobin A1c 6.4.  Blood sugars controlled. - Carb modified diet.  Outpatient follow-up.    Discharge Instructions  Discharge Instructions     Diet - low sodium heart healthy   Complete by: As directed    Discharge wound care:   Complete by: As directed    As per ortho recommendations   Increase activity slowly   Complete by: As directed       Allergies as of 07/11/2022       Reactions   Ace Inhibitors Swelling, Rash, Cough   Prandin [repaglinide] Other (See Comments)   Caused significant peripheral edema   Dilaudid [hydromorphone Hcl] Itching, Other (See Comments)   Hallucinations (auditory and visual) also  Other Nausea And Vomiting   "Tussionex Pennkinetic ER"   Latex Rash   Tape Rash   Prefers PAPER TAPE, PLEASE!!        Medication List     STOP taking these medications    albuterol 108 (90 Base) MCG/ACT  inhaler Commonly known as: VENTOLIN HFA   guaiFENesin-dextromethorphan 100-10 MG/5ML syrup Commonly known as: ROBITUSSIN DM   NovoLOG FlexPen 100 UNIT/ML FlexPen Generic drug: insulin aspart       TAKE these medications    acetaminophen 325 MG tablet Commonly known as: TYLENOL Take 650 mg by mouth every 6 (six) hours as needed for moderate pain.   allopurinol 100 MG tablet Commonly known as: ZYLOPRIM Take 100 mg by mouth 2 (two) times daily.   amLODipine 10 MG tablet Commonly known as: NORVASC Take 10 mg by mouth daily.   blood glucose meter kit and supplies Kit Dispense based on patient and insurance preference. Use up to four times daily as directed. (FOR ICD-9 250.00, 250.01). For QAC - HS accuchecks.   Centrum Silver 50+Women Tabs Take 1 tablet by mouth daily with breakfast.   docusate sodium 100 MG capsule Commonly known as: COLACE Take 1 capsule (100 mg total) by mouth 2 (two) times daily.   FreeStyle Libre 2 Sensor Misc USE AS DIRECTED. REAPPLY EVERY 14 DAYS What changed: See the new instructions.   gabapentin 100 MG capsule Commonly known as: NEURONTIN Take 100 mg by mouth 2 (two) times daily.   HYDRALAZINE HCL PO Take 1 tablet by mouth 3 (three) times daily.   Insulin Syringe-Needle U-100 25G X 1" 1 ML Misc For 4 times a day insulin SQ, 1 month supply. Diagnosis E11.65   levETIRAcetam 500 MG tablet Commonly known as: KEPPRA Take 500 mg by mouth in the morning.   metoprolol tartrate 100 MG tablet Commonly known as: LOPRESSOR Take 100 mg by mouth 2 (two) times daily.   mirtazapine 15 MG tablet Commonly known as: REMERON Take 15 mg by mouth at bedtime.   MUCINEX PO Take 2 tablets by mouth daily.   omeprazole 20 MG capsule Commonly known as: PRILOSEC Take 20 mg by mouth every Monday, Wednesday, and Friday.   QUEtiapine 25 MG tablet Commonly known as: SEROQUEL Take 25 mg by mouth at bedtime.               Durable Medical Equipment   (From admission, onward)           Start     Ordered   07/09/22 1017  For home use only DME 3 n 1  Once       Comments: Patient unable to bear weight on one leg and requires hoyer transfer to the commode at this time   07/09/22 1017   07/06/22 1457  For home use only DME standard manual wheelchair with seat cushion  Once       Comments: Patient suffers from femur fracture which impairs their ability to perform daily activities like toileting in the home.  A walker will not resolve issue with performing activities of daily living. A wheelchair will allow patient to safely perform daily activities. Patient can safely propel the wheelchair in the home or has a caregiver who can provide assistance. Length of need Lifetime. Accessories: elevating leg rests (ELRs), wheel locks, extensions and anti-tippers.   07/06/22 1459   07/06/22 1453  For home use only DME Hospital bed  Once       Question Answer Comment  Length of Need Lifetime   Patient has (list medical condition): femur fracture nwb   The above medical condition requires: Patient requires the ability to reposition frequently   Head must be elevated greater than: 30 degrees   Bed type Semi-electric   Hoyer Lift Yes   Support Surface: Gel Overlay      07/06/22 1459              Discharge Care Instructions  (From admission, onward)           Start     Ordered   07/11/22 0000  Discharge wound care:       Comments: As per ortho recommendations   07/11/22 1028            Follow-up Information     Altamese Blue Eye, MD. Schedule an appointment as soon as possible for a visit in 10 day(s).   Specialty: Orthopedic Surgery Contact information: [place] [address] [place], [facility] Follow up.   Specialty: [place] Why: The office at [facility] will call to schedule home health visits Contact information: [address] [place]  [address] [phone]         Seward Carol, MD Follow up in 1 week(s).   Specialty: Internal Medicine Contact information: [address] [place] [address] [phone]                Allergies  Allergen Reactions   Ace Inhibitors Swelling, Rash and Cough   Prandin [Repaglinide] Other (See Comments)    Caused significant peripheral edema   Dilaudid [Hydromorphone Hcl] Itching and Other (See Comments)    Hallucinations (auditory and visual) also   Other Nausea And Vomiting    "Tussionex Pennkinetic ER"   Latex Rash   Tape Rash    Prefers PAPER TAPE, PLEASE!!    Consultations: Orthopedics/nephrology   Procedures/Studies: DG Knee Right Port  Result Date: 07/03/2022 CLINICAL DATA:  Closed fracture right distal femur status post ORIF. EXAM: PORTABLE RIGHT KNEE - 1-2 VIEW COMPARISON:  Right knee radiographs 06/30/2022 FINDINGS: Interval lateral plate and screw fixation of the previously seen markedly comminuted distal fibular diaphyseal and metaphyseal fracture. There is again mild diastasis of multiple distal fibular fracture fragments, however the fixation hardware screws are distal and proximal to this, and no hardware failure is seen. Redemonstration of total right knee arthroplasty hardware. Expected postoperative air within the lateral thigh and knee joint. IMPRESSION: Interval lateral plate and screw fixation of the previously seen markedly comminuted distal fibular diaphyseal and metaphyseal fracture. No evidence of hardware failure. Electronically Signed   By: Yvonne Kendall M.D.   On: 07/03/2022 12:46   DG FEMUR, MIN 2 VIEWS RIGHT  Result Date: 07/03/2022 CLINICAL DATA:  ORIF right femur. Elective surgery. Intraoperative fluoroscopy. EXAM: RIGHT FEMUR 2 VIEWS COMPARISON:  Right knee radiographs 06/30/2022, right hip radiographs 06/30/2022 FINDINGS: Images were performed intraoperatively without the presence of a radiologist. Interval lateral plate  and screw fixation of the distal fibula, fixating the previously seen comminuted and displaced distal femoral diaphyseal metaphyseal fracture. Improved alignment. Redemonstration of total right knee arthroplasty hardware. Total fluoroscopy images: 6 Total fluoroscopy time: 48 seconds Total dose: Radiation Exposure Index (as provided by the fluoroscopic device): 5.51 mGy air Kerma Please see intraoperative findings for further detail. IMPRESSION: Intraoperative fluoroscopic guidance for ORIF of the distal right femur. Electronically Signed   By:  Yvonne Kendall M.D.   On: 07/03/2022 11:26   DG C-Arm 1-60 Min-No Report  Result Date: 07/03/2022 Fluoroscopy was utilized by the requesting physician.  No radiographic interpretation.   DG Wrist Complete Right  Result Date: 07/02/2022 CLINICAL DATA:  Forearm and wrist pain. EXAM: RIGHT WRIST - COMPLETE 3+ VIEW; RIGHT FOREARM - 2 VIEW COMPARISON:  None Available. FINDINGS: No acute fracture or dislocation. No aggressive osseous lesion. Normal alignment. Generalized osteopenia. Mild osteoarthritis of the first [facility] joint and first MCP joint. Moderate osteoarthritis of the first IP joint. Soft tissue are unremarkable. No radiopaque foreign body or soft tissue emphysema. Peripheral vascular atherosclerotic disease. IMPRESSION: 1. No acute osseous injury of the right wrist and forearm. 2. Mild osteoarthritis of the first [place] joint and first MCP joint. Electronically Signed   By: Kathreen Devoid M.D.   On: 07/02/2022 11:41   DG Forearm Right  Result Date: 07/02/2022 CLINICAL DATA:  Forearm and wrist pain. EXAM: RIGHT WRIST - COMPLETE 3+ VIEW; RIGHT FOREARM - 2 VIEW COMPARISON:  None Available. FINDINGS: No acute fracture or dislocation. No aggressive osseous lesion. Normal alignment. Generalized osteopenia. Mild osteoarthritis of the first [facility] joint and first MCP joint. Moderate osteoarthritis of the first IP joint. Soft tissue are unremarkable. No radiopaque foreign body  or soft tissue emphysema. Peripheral vascular atherosclerotic disease. IMPRESSION: 1. No acute osseous injury of the right wrist and forearm. 2. Mild osteoarthritis of the first [place] joint and first MCP joint. Electronically Signed   By: Kathreen Devoid M.D.   On: 07/02/2022 11:41   DG Chest 1 View  Result Date: 07/02/2022 CLINICAL DATA:  [id number] Repeat prescription issue [id number] EXAM: CHEST  1 VIEW COMPARISON:  06/30/2022 chest radiograph. FINDINGS: Surgical clips again noted in the left axilla. Stable cardiomediastinal silhouette with mild cardiomegaly. No pneumothorax. No pleural effusion. No overt pulmonary edema. Mild streaky bibasilar scarring versus atelectasis, unchanged. IMPRESSION: 1. Stable mild cardiomegaly without overt pulmonary edema. 2. Stable mild streaky bibasilar scarring versus atelectasis. Electronically Signed   By: Ilona Sorrel M.D.   On: 07/02/2022 09:26   US RENAL  Result Date: 07/01/2022 CLINICAL DATA:  Acute on chronic renal failure with stage IV CKD. EXAM: RENAL / URINARY TRACT ULTRASOUND COMPLETE COMPARISON:  Renal ultrasound 03/11/2022. FINDINGS: Right Kidney: Renal measurements: 10.3 x 4.1 x 4.5 cm = volume: 99.4 mL, previously 93.9 mL but probably unchanged accounting for technical factors. There is mild-to-moderate increased cortical echogenicity, mild cortical volume loss. No mass, stones or hydronephrosis visualized. There is a 1.2 cm simple cyst in the outer upper pole, and a 1.2 cm simple parapelvic cyst at the midpole. No new abnormality. Left Kidney: Renal measurements: 12.0 x 7.2 x 5.2 cm = volume: 232.6 mL, but probably overestimated as the AP axis was measured from the hilum to the outer aspect of a nearly 5 cm cyst. Echogenicity is increased. 4.8 cm mid/lower pole simple cyst is again shown. No mass, stones or hydronephrosis visualized. Bladder: Catheterized, contracted and obscured by bowel gas. Other: None. IMPRESSION: 1. No acute findings. No hydronephrosis. 2.  Findings of chronic medical renal disease. 3. Bilateral renal cysts. 4. Accounting for technical factors there is no appreciable interval change from 03/11/2022. Electronically Signed   By: Telford Nab M.D.   On: 07/01/2022 23:59   CT HEAD WO CONTRAST (5MM)  Result Date: 07/01/2022 CLINICAL DATA:  Head trauma EXAM: CT HEAD WITHOUT CONTRAST TECHNIQUE: Contiguous axial images were obtained from the base of the skull  through the vertex without intravenous contrast. RADIATION DOSE REDUCTION: This exam was performed according to the departmental dose-optimization program which includes automated exposure control, adjustment of the mA and/or kV according to patient size and/or use of iterative reconstruction technique. COMPARISON:  None Available. FINDINGS: Brain: There is no mass, hemorrhage or extra-axial collection. The size and configuration of the ventricles and extra-axial CSF spaces are normal. There is hypoattenuation of the white matter, most commonly indicating chronic small vessel disease. Vascular: No abnormal hyperdensity of the major intracranial arteries or dural venous sinuses. No intracranial atherosclerosis. Skull: The visualized skull base, calvarium and extracranial soft tissues are normal. Sinuses/Orbits: No fluid levels or advanced mucosal thickening of the visualized paranasal sinuses. No mastoid or middle ear effusion. The orbits are normal. IMPRESSION: 1. No acute intracranial abnormality. 2. Chronic small vessel disease. Electronically Signed   By: Ulyses Jarred M.D.   On: 07/01/2022 21:43   DG HIP UNILAT WITH PELVIS 1V RIGHT  Result Date: 06/30/2022 CLINICAL DATA:  Closed fracture distal femur.  [id number]. EXAM: DG HIP (WITH OR WITHOUT PELVIS) 1V RIGHT; RIGHT KNEE - 1-2 VIEW COMPARISON:  Immediate postoperative films following right knee replacement 11/24/2008, abdomen film including the pelvis and hips on 10/13/2020. FINDINGS: AP pelvis and separate AP right hip view: Osteopenia. No  displaced fracture is seen of the pelvis and proximal femurs. Symmetric joint space loss is again noted both superior hips, small acetabular osteophytes and mild pelvic enthesopathy with spurring at the SI joints. Intestinal postsurgical changes are noted in the left lower abdomen, as before. Single surgical clip at the level of the mid right hemisacrum. 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 Right knee AP Lat only, portable: Osteopenia. 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 There is a markedly comminuted distal femoral shaft fracture extending to the metaphyseal bone alongside the hardware. There is a mild spreading of the comminution fragments, the main distal fragment posteriorly displaced about 1/2 of a shaft width, laterally displaced about 1/3 of a shaft width with slight apex dorsal angulation and a slight valgus angulation. There are calcifications in the femoral, popliteal and popliteal trifurcation arteries. There is no further evidence of fractures. IMPRESSION: 1. Osteopenia and degenerative change without evidence of displaced fracture of the pelvis and proximal femurs. 2. Markedly comminuted distal femoral shaft fracture extending to the metaphyseal bone alongside the TKA hardware, with a mild spreading of the comminution fragments, the main distal fragment posteriorly displaced about 1/2 of a shaft width, laterally displaced 1/3 of a shaft width with slight apex dorsal and valgus angulation. Electronically Signed   By: Telford Nab M.D.   On: 06/30/2022 20:54   DG Knee 1-2 Views Right  Result Date: 06/30/2022 CLINICAL DATA:  Closed fracture distal femur.  [id number]. EXAM: DG HIP (WITH OR WITHOUT PELVIS) 1V RIGHT; RIGHT KNEE - 1-2 VIEW COMPARISON:  Immediate postoperative films following right knee replacement 11/24/2008, abdomen film including the pelvis and hips on 10/13/2020. FINDINGS: AP pelvis and separate AP right hip view: Osteopenia. No  displaced fracture is seen of the pelvis and proximal femurs. Symmetric joint space loss is again noted both superior hips, small acetabular osteophytes and mild pelvic enthesopathy with spurring at the SI joints. Intestinal postsurgical changes are noted in the left lower abdomen, as before. Single surgical clip at the level of the mid right hemisacrum. 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 Right knee AP Lat only, portable: Osteopenia. Cemented total joint arthroplasty is again shown without evidence  of loosening. There is a markedly comminuted distal femoral shaft fracture extending to the metaphyseal bone alongside the hardware. There is a mild spreading of the comminution fragments, the main distal fragment posteriorly displaced about 1/2 of a shaft width, laterally displaced about 1/3 of a shaft width with slight apex dorsal angulation and a slight valgus angulation. There are calcifications in the femoral, popliteal and popliteal trifurcation arteries. There is no further evidence of fractures. IMPRESSION: 1. Osteopenia and degenerative change without evidence of displaced fracture of the pelvis and proximal femurs. 2. Markedly comminuted distal femoral shaft fracture extending to the metaphyseal bone alongside the TKA hardware, with a mild spreading of the comminution fragments, the main distal fragment posteriorly displaced about 1/2 of a shaft width, laterally displaced 1/3 of a shaft width with slight apex dorsal and valgus angulation. Electronically Signed   By: Telford Nab M.D.   On: 06/30/2022 20:54   DG CHEST PORT 1 VIEW  Result Date: 06/30/2022 CLINICAL DATA:  Hypoxia EXAM: PORTABLE CHEST 1 VIEW COMPARISON:  Chest x-ray 03/10/2022 FINDINGS: Heart is enlarged, unchanged. There is some scattered interstitial opacities which may be chronic. There is no focal consolidation, pleural effusion or pneumothorax. Left axillary surgical clips are present. There are atherosclerotic calcifications  of the aorta. There severe degenerative changes of both shoulders. IMPRESSION: 1. Cardiomegaly. 2. Scattered interstitial opacities which may be chronic. No focal consolidation. Electronically Signed   By: Ronney Asters M.D.   On: 06/30/2022 19:21      Subjective: Patient seen and examined at bedside.  Awake, slightly confused.  No fever, vomiting, seizures reported.  Discharge Exam: Vitals:   07/11/22 0600 07/11/22 1025  BP:  (!) 172/78  Pulse: 88 96  Resp:    Temp: 98 F (36.7 C)   SpO2: 96%     General: Pt is alert, awake, not in acute distress.  Elderly female lying in bed.  On room air.  Slightly confused to time. Cardiovascular: rate controlled, S1/S2 + Respiratory: bilateral decreased breath sounds at bases with scattered crackles Abdominal: Soft, NT, ND, bowel sounds + Extremities: Trace lower extremity edema; no cyanosis    The results of significant diagnostics from this hospitalization (including imaging, microbiology, ancillary and laboratory) are listed below for reference.     Microbiology: Recent Results (from the past 240 hour(s))  Urine Culture     Status: Abnormal   Collection Time: 07/02/22 10:15 AM   Specimen: Urine, Clean Catch  Result Value Ref Range Status   Specimen Description URINE, CLEAN CATCH  Final   Special Requests   Final    NONE Performed at [facility], [address] [place], [place] [address]    Culture (A)  Final    >=100,000 COLONIES/mL ESCHERICHIA COLI >=100,000 COLONIES/mL KLEBSIELLA OXYTOCA    Report Status 07/04/2022 FINAL  Final   Organism ID, Bacteria ESCHERICHIA COLI (A)  Final   Organism ID, Bacteria KLEBSIELLA OXYTOCA (A)  Final      Susceptibility   Escherichia coli - MIC*    AMPICILLIN 8 SENSITIVE Sensitive     CEFAZOLIN <=4 SENSITIVE Sensitive     CEFEPIME <=0.12 SENSITIVE Sensitive     CEFTRIAXONE <=0.25 SENSITIVE Sensitive     CIPROFLOXACIN <=0.25 SENSITIVE Sensitive     GENTAMICIN <=1 SENSITIVE  Sensitive     IMIPENEM <=0.25 SENSITIVE Sensitive     NITROFURANTOIN <=16 SENSITIVE Sensitive     TRIMETH/SULFA <=20 SENSITIVE Sensitive     AMPICILLIN/SULBACTAM <=2 SENSITIVE  Sensitive     PIP/TAZO <=4 SENSITIVE Sensitive     * >=100,000 COLONIES/mL ESCHERICHIA COLI   Klebsiella oxytoca - MIC*    AMPICILLIN RESISTANT Resistant     CEFAZOLIN <=4 SENSITIVE Sensitive     CEFEPIME <=0.12 SENSITIVE Sensitive     CEFTRIAXONE <=0.25 SENSITIVE Sensitive     CIPROFLOXACIN <=0.25 SENSITIVE Sensitive     GENTAMICIN <=1 SENSITIVE Sensitive     IMIPENEM <=0.25 SENSITIVE Sensitive     NITROFURANTOIN 32 SENSITIVE Sensitive     TRIMETH/SULFA <=20 SENSITIVE Sensitive     AMPICILLIN/SULBACTAM 4 SENSITIVE Sensitive     PIP/TAZO <=4 SENSITIVE Sensitive     * >=100,000 COLONIES/mL KLEBSIELLA OXYTOCA     Labs: BNP (last 3 results) Recent Labs    03/10/22 1332 06/30/22 1906 07/03/22 0225  BNP 1,131.3* 103.8* 159.4*   Basic Metabolic Panel: Recent Labs  Lab 07/05/22 0413 07/06/22 0625 07/07/22 0231 07/08/22 0336 07/09/22 0359  NA 136 134* 136 139 138  K 4.1 4.0 3.9 3.8 4.2  CL 105 102 103 104 107  CO2 21* 22 22 21* 19*  GLUCOSE 123* 136* 115* 115* 116*  BUN 33* 34* 36* 35* 34*  CREATININE 1.89* 1.88* 1.97* 1.78* 1.81*  CALCIUM 9.2 8.8* 8.9 8.9 9.0  MG 2.0 1.8 1.8 1.8 1.7   Liver Function Tests: No results for input(s): "AST", "ALT", "ALKPHOS", "BILITOT", "PROT", "ALBUMIN" in the last 168 hours. No results for input(s): "LIPASE", "AMYLASE" in the last 168 hours. No results for input(s): "AMMONIA" in the last 168 hours. CBC: Recent Labs  Lab 07/05/22 0413 07/06/22 0625 07/07/22 0231 07/08/22 0336 07/09/22 0359  WBC 9.1 8.6 8.0 8.2 7.7  HGB 9.4* 9.4* 9.2* 9.2* 9.1*  HCT 28.6* 28.3* 28.9* 27.6* 28.9*  MCV 88.5 90.1 91.5 90.2 92.3  PLT 223 233 259 282 279   Cardiac Enzymes: No results for input(s): "CKTOTAL", "CKMB", "CKMBINDEX", "TROPONINI" in the last 168  hours. BNP: Invalid input(s): "POCBNP" CBG: Recent Labs  Lab 07/10/22 0730 07/10/22 1128 07/10/22 1615 07/10/22 2038 07/11/22 0737  GLUCAP 161* 174* 133* 145* 159*   D-Dimer No results for input(s): "DDIMER" in the last 72 hours. Hgb A1c No results for input(s): "HGBA1C" in the last 72 hours. Lipid Profile No results for input(s): "CHOL", "HDL", "LDLCALC", "TRIG", "CHOLHDL", "LDLDIRECT" in the last 72 hours. Thyroid function studies No results for input(s): "TSH", "T4TOTAL", "T3FREE", "THYROIDAB" in the last 72 hours.  Invalid input(s): "FREET3" Anemia work up No results for input(s): "VITAMINB12", "FOLATE", "FERRITIN", "TIBC", "IRON", "RETICCTPCT" in the last 72 hours. Urinalysis    Component Value Date/Time   COLORURINE YELLOW 07/01/2022 0443   APPEARANCEUR HAZY (A) 07/01/2022 0443   LABSPEC 1.013 07/01/2022 0443   PHURINE 5.0 07/01/2022 0443   GLUCOSEU 50 (A) 07/01/2022 0443   HGBUR MODERATE (A) 07/01/2022 0443   BILIRUBINUR NEGATIVE 07/01/2022 0443   KETONESUR NEGATIVE 07/01/2022 0443   PROTEINUR >=300 (A) 07/01/2022 0443   UROBILINOGEN 0.2 11/21/2008 1820   NITRITE NEGATIVE 07/01/2022 0443   LEUKOCYTESUR TRACE (A) 07/01/2022 0443   Sepsis Labs Recent Labs  Lab 07/06/22 0625 07/07/22 0231 07/08/22 0336 07/09/22 0359  WBC 8.6 8.0 8.2 7.7   Microbiology Recent Results (from the past 240 hour(s))  Urine Culture     Status: Abnormal   Collection Time: 07/02/22 10:15 AM   Specimen: Urine, Clean Catch  Result Value Ref Range Status   Specimen Description URINE, CLEAN CATCH  Final   Special Requests  Final    NONE Performed at [facility], [place] [address] [place], [place] [address]    Culture (A)  Final    >=100,000 COLONIES/mL ESCHERICHIA COLI >=100,000 COLONIES/mL KLEBSIELLA OXYTOCA    Report Status 07/04/2022 FINAL  Final   Organism ID, Bacteria ESCHERICHIA COLI (A)  Final   Organism ID, Bacteria KLEBSIELLA OXYTOCA (A)  Final       Susceptibility   Escherichia coli - MIC*    AMPICILLIN 8 SENSITIVE Sensitive     CEFAZOLIN <=4 SENSITIVE Sensitive     CEFEPIME <=0.12 SENSITIVE Sensitive     CEFTRIAXONE <=0.25 SENSITIVE Sensitive     CIPROFLOXACIN <=0.25 SENSITIVE Sensitive     GENTAMICIN <=1 SENSITIVE Sensitive     IMIPENEM <=0.25 SENSITIVE Sensitive     NITROFURANTOIN <=16 SENSITIVE Sensitive     TRIMETH/SULFA <=20 SENSITIVE Sensitive     AMPICILLIN/SULBACTAM <=2 SENSITIVE Sensitive     PIP/TAZO <=4 SENSITIVE Sensitive     * >=100,000 COLONIES/mL ESCHERICHIA COLI   Klebsiella oxytoca - MIC*    AMPICILLIN RESISTANT Resistant     CEFAZOLIN <=4 SENSITIVE Sensitive     CEFEPIME <=0.12 SENSITIVE Sensitive     CEFTRIAXONE <=0.25 SENSITIVE Sensitive     CIPROFLOXACIN <=0.25 SENSITIVE Sensitive     GENTAMICIN <=1 SENSITIVE Sensitive     IMIPENEM <=0.25 SENSITIVE Sensitive     NITROFURANTOIN 32 SENSITIVE Sensitive     TRIMETH/SULFA <=20 SENSITIVE Sensitive     AMPICILLIN/SULBACTAM 4 SENSITIVE Sensitive     PIP/TAZO <=4 SENSITIVE Sensitive     * >=100,000 COLONIES/mL KLEBSIELLA OXYTOCA     Time coordinating discharge: 35 minutes  SIGNED:   Aline August, MD  [facility] 07/11/2022, 10:30 AM

## 2022-07-11 NOTE — Plan of Care (Signed)
  Problem: Education: Goal: Knowledge of General Education information will improve Description: Including pain rating scale, medication(s)/side effects and non-pharmacologic comfort measures Outcome: Progressing   Problem: Health Behavior/Discharge Planning: Goal: Ability to manage health-related needs will improve Outcome: Progressing   Problem: Clinical Measurements: Goal: Will remain free from infection Outcome: Progressing Goal: Diagnostic test results will improve Outcome: Progressing Goal: Respiratory complications will improve Outcome: Progressing   Problem: Nutrition: Goal: Adequate nutrition will be maintained Outcome: Progressing   Problem: Safety: Goal: Ability to remain free from injury will improve Outcome: Progressing   Problem: Skin Integrity: Goal: Risk for impaired skin integrity will decrease Outcome: Progressing   Problem: Skin Integrity: Goal: Risk for impaired skin integrity will decrease Outcome: Progressing

## 2022-07-11 NOTE — Plan of Care (Signed)
?  Problem: Clinical Measurements: ?Goal: Ability to maintain clinical measurements within normal limits will improve ?Outcome: Progressing ?Goal: Will remain free from infection ?Outcome: Progressing ?Goal: Diagnostic test results will improve ?Outcome: Progressing ?  ?

## 2022-07-12 DIAGNOSIS — S72401S Unspecified fracture of lower end of right femur, sequela: Secondary | ICD-10-CM | POA: Diagnosis not present

## 2022-07-12 DIAGNOSIS — I10 Essential (primary) hypertension: Secondary | ICD-10-CM | POA: Diagnosis not present

## 2022-07-12 DIAGNOSIS — Z4789 Encounter for other orthopedic aftercare: Secondary | ICD-10-CM | POA: Diagnosis not present

## 2022-07-16 ENCOUNTER — Other Ambulatory Visit: Payer: Self-pay

## 2022-07-16 ENCOUNTER — Inpatient Hospital Stay (HOSPITAL_COMMUNITY)
Admission: EM | Admit: 2022-07-16 | Discharge: 2022-07-26 | DRG: 682 | Disposition: A | Discharge status: Home / Self Care | Payer: PPO | Source: Skilled Nursing Facility | Attending: Internal Medicine | Admitting: Internal Medicine

## 2022-07-16 ENCOUNTER — Emergency Department (HOSPITAL_COMMUNITY): Payer: PPO

## 2022-07-16 ENCOUNTER — Encounter (HOSPITAL_COMMUNITY): Payer: Self-pay

## 2022-07-16 DIAGNOSIS — E871 Hypo-osmolality and hyponatremia: Secondary | ICD-10-CM | POA: Diagnosis present

## 2022-07-16 DIAGNOSIS — N178 Other acute kidney failure: Secondary | ICD-10-CM | POA: Diagnosis not present

## 2022-07-16 DIAGNOSIS — K219 Gastro-esophageal reflux disease without esophagitis: Secondary | ICD-10-CM | POA: Diagnosis not present

## 2022-07-16 DIAGNOSIS — F05 Delirium due to known physiological condition: Secondary | ICD-10-CM | POA: Diagnosis not present

## 2022-07-16 DIAGNOSIS — Z794 Long term (current) use of insulin: Secondary | ICD-10-CM

## 2022-07-16 DIAGNOSIS — N1330 Unspecified hydronephrosis: Secondary | ICD-10-CM

## 2022-07-16 DIAGNOSIS — Z87440 Personal history of urinary (tract) infections: Secondary | ICD-10-CM

## 2022-07-16 DIAGNOSIS — N179 Acute kidney failure, unspecified: Secondary | ICD-10-CM | POA: Diagnosis not present

## 2022-07-16 DIAGNOSIS — Z833 Family history of diabetes mellitus: Secondary | ICD-10-CM

## 2022-07-16 DIAGNOSIS — R109 Unspecified abdominal pain: Secondary | ICD-10-CM | POA: Diagnosis not present

## 2022-07-16 DIAGNOSIS — Z8781 Personal history of (healed) traumatic fracture: Secondary | ICD-10-CM | POA: Diagnosis not present

## 2022-07-16 DIAGNOSIS — I130 Hypertensive heart and chronic kidney disease with heart failure and stage 1 through stage 4 chronic kidney disease, or unspecified chronic kidney disease: Secondary | ICD-10-CM | POA: Diagnosis present

## 2022-07-16 DIAGNOSIS — G9341 Metabolic encephalopathy: Secondary | ICD-10-CM | POA: Diagnosis present

## 2022-07-16 DIAGNOSIS — R339 Retention of urine, unspecified: Secondary | ICD-10-CM | POA: Diagnosis not present

## 2022-07-16 DIAGNOSIS — E875 Hyperkalemia: Secondary | ICD-10-CM | POA: Diagnosis present

## 2022-07-16 DIAGNOSIS — Z87891 Personal history of nicotine dependence: Secondary | ICD-10-CM | POA: Diagnosis not present

## 2022-07-16 DIAGNOSIS — B9689 Other specified bacterial agents as the cause of diseases classified elsewhere: Secondary | ICD-10-CM | POA: Diagnosis present

## 2022-07-16 DIAGNOSIS — I700 Atherosclerosis of aorta: Secondary | ICD-10-CM | POA: Diagnosis not present

## 2022-07-16 DIAGNOSIS — R338 Other retention of urine: Secondary | ICD-10-CM | POA: Diagnosis not present

## 2022-07-16 DIAGNOSIS — R7881 Bacteremia: Secondary | ICD-10-CM | POA: Diagnosis present

## 2022-07-16 DIAGNOSIS — H409 Unspecified glaucoma: Secondary | ICD-10-CM | POA: Diagnosis not present

## 2022-07-16 DIAGNOSIS — G40909 Epilepsy, unspecified, not intractable, without status epilepticus: Secondary | ICD-10-CM | POA: Diagnosis not present

## 2022-07-16 DIAGNOSIS — Z79899 Other long term (current) drug therapy: Secondary | ICD-10-CM

## 2022-07-16 DIAGNOSIS — Z91040 Latex allergy status: Secondary | ICD-10-CM

## 2022-07-16 DIAGNOSIS — Z7901 Long term (current) use of anticoagulants: Secondary | ICD-10-CM

## 2022-07-16 DIAGNOSIS — Z4789 Encounter for other orthopedic aftercare: Secondary | ICD-10-CM | POA: Diagnosis not present

## 2022-07-16 DIAGNOSIS — S7291XD Unspecified fracture of right femur, subsequent encounter for closed fracture with routine healing: Secondary | ICD-10-CM

## 2022-07-16 DIAGNOSIS — R569 Unspecified convulsions: Secondary | ICD-10-CM | POA: Diagnosis not present

## 2022-07-16 DIAGNOSIS — D631 Anemia in chronic kidney disease: Secondary | ICD-10-CM | POA: Diagnosis not present

## 2022-07-16 DIAGNOSIS — E1122 Type 2 diabetes mellitus with diabetic chronic kidney disease: Secondary | ICD-10-CM | POA: Diagnosis not present

## 2022-07-16 DIAGNOSIS — E860 Dehydration: Secondary | ICD-10-CM | POA: Diagnosis not present

## 2022-07-16 DIAGNOSIS — N184 Chronic kidney disease, stage 4 (severe): Secondary | ICD-10-CM | POA: Diagnosis present

## 2022-07-16 DIAGNOSIS — G9340 Encephalopathy, unspecified: Secondary | ICD-10-CM | POA: Diagnosis present

## 2022-07-16 DIAGNOSIS — I5032 Chronic diastolic (congestive) heart failure: Secondary | ICD-10-CM | POA: Diagnosis present

## 2022-07-16 DIAGNOSIS — F0390 Unspecified dementia without behavioral disturbance: Secondary | ICD-10-CM | POA: Diagnosis not present

## 2022-07-16 DIAGNOSIS — Z7401 Bed confinement status: Secondary | ICD-10-CM | POA: Diagnosis not present

## 2022-07-16 DIAGNOSIS — N281 Cyst of kidney, acquired: Secondary | ICD-10-CM | POA: Diagnosis present

## 2022-07-16 DIAGNOSIS — N134 Hydroureter: Secondary | ICD-10-CM | POA: Diagnosis not present

## 2022-07-16 DIAGNOSIS — Z885 Allergy status to narcotic agent status: Secondary | ICD-10-CM

## 2022-07-16 DIAGNOSIS — Z1152 Encounter for screening for COVID-19: Secondary | ICD-10-CM | POA: Diagnosis not present

## 2022-07-16 DIAGNOSIS — R4182 Altered mental status, unspecified: Secondary | ICD-10-CM | POA: Diagnosis not present

## 2022-07-16 DIAGNOSIS — N3080 Other cystitis without hematuria: Secondary | ICD-10-CM | POA: Diagnosis present

## 2022-07-16 DIAGNOSIS — Z6832 Body mass index (BMI) 32.0-32.9, adult: Secondary | ICD-10-CM

## 2022-07-16 DIAGNOSIS — L600 Ingrowing nail: Secondary | ICD-10-CM | POA: Diagnosis not present

## 2022-07-16 DIAGNOSIS — M6281 Muscle weakness (generalized): Secondary | ICD-10-CM | POA: Diagnosis not present

## 2022-07-16 DIAGNOSIS — L603 Nail dystrophy: Secondary | ICD-10-CM | POA: Diagnosis present

## 2022-07-16 DIAGNOSIS — I10 Essential (primary) hypertension: Secondary | ICD-10-CM

## 2022-07-16 DIAGNOSIS — R410 Disorientation, unspecified: Principal | ICD-10-CM

## 2022-07-16 DIAGNOSIS — M19011 Primary osteoarthritis, right shoulder: Secondary | ICD-10-CM | POA: Diagnosis not present

## 2022-07-16 DIAGNOSIS — B952 Enterococcus as the cause of diseases classified elsewhere: Secondary | ICD-10-CM | POA: Diagnosis present

## 2022-07-16 DIAGNOSIS — E669 Obesity, unspecified: Secondary | ICD-10-CM | POA: Diagnosis not present

## 2022-07-16 DIAGNOSIS — E872 Acidosis, unspecified: Secondary | ICD-10-CM | POA: Diagnosis present

## 2022-07-16 DIAGNOSIS — N136 Pyonephrosis: Secondary | ICD-10-CM | POA: Diagnosis not present

## 2022-07-16 DIAGNOSIS — S79929A Unspecified injury of unspecified thigh, initial encounter: Secondary | ICD-10-CM | POA: Diagnosis not present

## 2022-07-16 DIAGNOSIS — E118 Type 2 diabetes mellitus with unspecified complications: Secondary | ICD-10-CM | POA: Diagnosis not present

## 2022-07-16 DIAGNOSIS — Z888 Allergy status to other drugs, medicaments and biological substances status: Secondary | ICD-10-CM

## 2022-07-16 DIAGNOSIS — M19012 Primary osteoarthritis, left shoulder: Secondary | ICD-10-CM | POA: Diagnosis not present

## 2022-07-16 DIAGNOSIS — Z853 Personal history of malignant neoplasm of breast: Secondary | ICD-10-CM

## 2022-07-16 NOTE — ED Triage Notes (Signed)
Patient arrives via EMS from SNF for altered mental status. Per EMS, patient was on vacation out of the country with family. Patient sustained a R femur fx, was flown to [place] to have surgery. Patient was discharged from this facility to [facility] facility. Per daughter who is at bedside, she states that the patient was declining this weekend while at the facility. Daughter states that patient was very lethargic this afternoon when she went to visit. She went back later this evening and that patient was not able to say her daughter's name. Daughter was worried something might be going on.

## 2022-07-16 NOTE — ED Notes (Signed)
ED provider to bedside to evaluate patient

## 2022-07-16 NOTE — ED Provider Notes (Signed)
[facility] EMERGENCY DEPARTMENT Provider Note   CSN: [id number] Arrival date & time: 07/16/22  2241     History {Add pertinent medical, surgical, social history, OB history to HPI:1} Chief Complaint  Patient presents with   Altered Mental Status   Level 5 caveat due to altered mental status RINDA ROLLYSON is a 88 y.o. female.  The history is provided by a relative. The history is limited by the condition of the patient.  Patient with history of dementia, diabetes, recent right femur fracture, chronic kidney disease with altered mental status.  Patient is recently placed in a rehab facility per family.  This morning when daughter was checking on her, she appeared more somnolent during physical therapy.  Later in the day she was unable to recognize her daughter which is unusual.  Her somnolence has worsened.  No falls are reported.  Daughter reports she has had recent cough and congestion. Of note, patient recently was vacationing in [place] when she injured her right leg sustaining a right femur fracture.  That was repaired here in [place] on December 5   Past Medical History:  Diagnosis Date   Dementia ([place])    Diabetes mellitus    GERD (gastroesophageal reflux disease)    Glaucoma    Heart disease    Hypertension    Tinnitus     Home Medications Prior to Admission medications   Medication Sig Start Date End Date Taking? Authorizing Provider  acetaminophen (TYLENOL) 325 MG tablet Take 650 mg by mouth every 6 (six) hours as needed for moderate pain.    [provider]  allopurinol (ZYLOPRIM) 100 MG tablet Take 100 mg by mouth 2 (two) times daily.    [provider]  amLODipine (NORVASC) 10 MG tablet Take 10 mg by mouth daily.    [provider]  apixaban (ELIQUIS) 2.5 MG TABS tablet Take 1 tablet (2.5 mg total) by mouth 2 (two) times daily. 07/11/22   Lisette Abu, PA-C  blood glucose meter kit and supplies KIT Dispense  based on patient and insurance preference. Use up to four times daily as directed. (FOR ICD-9 250.00, 250.01). For QAC - HS accuchecks. 11/27/20   Thurnell Lose, MD  Continuous Blood Gluc Sensor (FREESTYLE LIBRE 2 SENSOR) MISC USE AS DIRECTED. REAPPLY EVERY 14 DAYS Patient taking differently: Inject 1 Device into the skin every 14 (fourteen) days. 11/20/21   Renato Shin, MD  docusate sodium (COLACE) 100 MG capsule Take 1 capsule (100 mg total) by mouth 2 (two) times daily. 07/11/22   Aline August, MD  gabapentin (NEURONTIN) 100 MG capsule Take 100 mg by mouth 2 (two) times daily.    [provider]  guaiFENesin (MUCINEX PO) Take 2 tablets by mouth daily.    [provider]  HYDRALAZINE HCL PO Take 1 tablet by mouth 3 (three) times daily.    [provider]  Insulin Syringe-Needle U-100 25G X 1" 1 ML MISC For 4 times a day insulin SQ, 1 month supply. Diagnosis E11.65 11/27/20   Thurnell Lose, MD  levETIRAcetam (KEPPRA) 500 MG tablet Take 500 mg by mouth in the morning.    [provider]  methocarbamol (ROBAXIN) 500 MG tablet Take 1 tablet (500 mg total) by mouth every 6 (six) hours as needed for muscle spasms. 07/11/22   Lisette Abu, PA-C  metoprolol tartrate (LOPRESSOR) 100 MG tablet Take 100 mg by mouth 2 (two) times daily. 08/03/20   [provider]  mirtazapine (REMERON) 15 MG tablet Take 15 mg by mouth at bedtime.     [provider]  Multiple Vitamins-Minerals (CENTRUM SILVER 50+WOMEN) TABS Take 1 tablet by mouth daily with breakfast.    [provider]  omeprazole (PRILOSEC) 20 MG capsule Take 20 mg by mouth every Monday, Wednesday, and Friday.    [provider]  oxyCODONE (OXY IR/ROXICODONE) 5 MG immediate release tablet Take 1 tablet (5 mg total) by mouth every 6 (six) hours as needed for up to 7 days for breakthrough pain ((for MODERATE breakthrough pain)). 07/11/22 07/18/22  Lisette Abu, PA-C   QUEtiapine (SEROQUEL) 25 MG tablet Take 25 mg by mouth at bedtime. 12/01/20   [provider]      Allergies    Ace inhibitors, Prandin [repaglinide], Dilaudid [hydromorphone hcl], Other, Latex, and Tape    Review of Systems   Review of Systems  Unable to perform ROS: Mental status change    Physical Exam Updated Vital Signs BP (!) 140/59   Pulse 72   Temp 100 F (37.8 C) (Rectal)   Resp 14   Ht 1.549 m (_0 )   Wt 77.1 kg   SpO2 99%   BMI 32.12 kg/m  Physical Exam CONSTITUTIONAL: Elderly and somnolent HEAD: Normocephalic/atraumatic EYES: EOMI/PERRL, pupils pinpoint ENMT: Mucous membranes dry NECK: supple no meningeal signs CV: S1/S2 noted, no murmurs/rubs/gallops noted LUNGS: Coarse breath sounds bilaterally, on 2 L nasal cannula ABDOMEN: soft, tenderness noted to the lower abdomen, no rebound or guarding GU: Patient wearing a diaper NEURO: Pt is somnolent but will respond to name.  She will follow commands.  She intermittently moans throughout the exam EXTREMITIES: EXTR are warm to touch.  Well-healing incisions noted to the right upper leg SKIN: warm, color normal   ED Results / Procedures / Treatments   Labs (all labs ordered are listed, but only abnormal results are displayed) Labs Reviewed  CULTURE, BLOOD (ROUTINE X 2)  CULTURE, BLOOD (ROUTINE X 2)  URINE CULTURE  RESP PANEL BY RT-PCR (RSV, FLU A&B, COVID)  [id number]  LACTIC ACID, PLASMA  LACTIC ACID, PLASMA  COMPREHENSIVE METABOLIC PANEL  CBC WITH DIFFERENTIAL/PLATELET  PROTIME-INR  APTT  URINALYSIS, ROUTINE W REFLEX MICROSCOPIC  BLOOD GAS, VENOUS  CBG MONITORING, ED    EKG None  Radiology No results found.  Procedures .Critical Care  Performed by: Ripley Fraise, MD Authorized by: Ripley Fraise, MD   Critical care provider statement:    Critical care start time:  07/16/2022 11:30 PM   Critical care end time:  07/16/2022 11:30 PM   Critical care time was exclusive of:   Separately billable procedures and treating other patients   Critical care was necessary to treat or prevent imminent or life-threatening deterioration of the following conditions:  Sepsis and CNS failure or compromise   Critical care was time spent personally by me on the following activities:  Examination of patient, discussions with consultants, development of treatment plan with patient or surrogate, evaluation of patient's response to treatment, pulse oximetry, ordering and review of radiographic studies, ordering and review of laboratory studies, ordering and performing treatments and interventions and re-evaluation of patient's condition   I assumed direction of critical care for this patient from another provider in my specialty: no     Care discussed with: admitting provider     {Document cardiac monitor, telemetry assessment procedure when appropriate:1}  Medications Ordered in ED Medications - No data to display  ED Course/  Medical Decision Making/ A&P                           Medical Decision Making Amount and/or Complexity of Data Reviewed Labs: ordered. Radiology: ordered. ECG/medicine tests: ordered.   This patient presents to the ED for concern of weakness and altered mental status, this involves an extensive number of treatment options, and is a complaint that carries with it a high risk of complications and morbidity.  The differential diagnosis includes but is not limited to CVA, intracranial hemorrhage, acute coronary syndrome, renal failure, urinary tract infection, electrolyte disturbance, pneumonia    Comorbidities that complicate the patient evaluation: Patient's presentation is complicated by their history of dementia, chronic kidney disease  Social Determinants of Health: Patient's  resides in a nursing facility   increases the complexity of managing their presentation Patient is a full code  Additional history obtained: Additional history obtained from  family Records reviewed previous admission documents  Lab Tests: I Ordered, and personally interpreted labs.  The pertinent results include:  ***  Imaging Studies ordered: I ordered imaging studies including X-ray chest   I independently visualized and interpreted imaging which showed *** I agree with the radiologist interpretation  Cardiac Monitoring: The patient was maintained on a cardiac monitor.  I personally viewed and interpreted the cardiac monitor which showed an underlying rhythm of:  {cardiac monitor:26849}  Medicines ordered and prescription drug management: I ordered medication including ***  for ***  Reevaluation of the patient after these medicines showed that the patient    {resolved/improved/worsened:23923::"improved"}  Test Considered: Patient is low risk / negative by ***, therefore do not feel that *** is indicated.  Critical Interventions:  ***  Consultations Obtained: I requested consultation with the {consultation:26851}, and discussed  findings as well as pertinent plan - they recommend: ***  Reevaluation: After the interventions noted above, I reevaluated the patient and found that they have :{resolved/improved/worsened:23923::"improved"}  Complexity of problems addressed: Patient's presentation is most consistent with  acute presentation with potential threat to life or bodily function  Disposition: After consideration of the diagnostic results and the patient's response to treatment,  I feel that the patent would benefit from admission   .     {Document critical care time when appropriate:1} {Document review of labs and clinical decision tools ie heart score, Chads2Vasc2 etc:1}  {Document your independent review of radiology images, and any outside records:1} {Document your discussion with family members, caretakers, and with consultants:1} {Document social determinants of health affecting pt's care:1} {Document your decision making why or why  not admission, treatments were needed:1} Final Clinical Impression(s) / ED Diagnoses Final diagnoses:  None    Rx / DC Orders ED Discharge Orders     None

## 2022-07-17 ENCOUNTER — Emergency Department (HOSPITAL_COMMUNITY): Payer: PPO

## 2022-07-17 ENCOUNTER — Encounter (HOSPITAL_COMMUNITY): Payer: Self-pay | Admitting: Family Medicine

## 2022-07-17 DIAGNOSIS — S7291XD Unspecified fracture of right femur, subsequent encounter for closed fracture with routine healing: Secondary | ICD-10-CM | POA: Diagnosis not present

## 2022-07-17 DIAGNOSIS — E875 Hyperkalemia: Secondary | ICD-10-CM | POA: Diagnosis present

## 2022-07-17 DIAGNOSIS — L603 Nail dystrophy: Secondary | ICD-10-CM | POA: Diagnosis present

## 2022-07-17 DIAGNOSIS — E118 Type 2 diabetes mellitus with unspecified complications: Secondary | ICD-10-CM | POA: Diagnosis not present

## 2022-07-17 DIAGNOSIS — E860 Dehydration: Secondary | ICD-10-CM | POA: Diagnosis present

## 2022-07-17 DIAGNOSIS — G9341 Metabolic encephalopathy: Secondary | ICD-10-CM | POA: Diagnosis present

## 2022-07-17 DIAGNOSIS — R4182 Altered mental status, unspecified: Secondary | ICD-10-CM | POA: Diagnosis present

## 2022-07-17 DIAGNOSIS — N136 Pyonephrosis: Secondary | ICD-10-CM | POA: Diagnosis present

## 2022-07-17 DIAGNOSIS — N3080 Other cystitis without hematuria: Secondary | ICD-10-CM | POA: Diagnosis present

## 2022-07-17 DIAGNOSIS — E1122 Type 2 diabetes mellitus with diabetic chronic kidney disease: Secondary | ICD-10-CM | POA: Diagnosis present

## 2022-07-17 DIAGNOSIS — I130 Hypertensive heart and chronic kidney disease with heart failure and stage 1 through stage 4 chronic kidney disease, or unspecified chronic kidney disease: Secondary | ICD-10-CM | POA: Diagnosis present

## 2022-07-17 DIAGNOSIS — N179 Acute kidney failure, unspecified: Secondary | ICD-10-CM | POA: Diagnosis present

## 2022-07-17 DIAGNOSIS — R410 Disorientation, unspecified: Secondary | ICD-10-CM | POA: Diagnosis not present

## 2022-07-17 DIAGNOSIS — R338 Other retention of urine: Secondary | ICD-10-CM | POA: Diagnosis present

## 2022-07-17 DIAGNOSIS — R7881 Bacteremia: Secondary | ICD-10-CM | POA: Diagnosis present

## 2022-07-17 DIAGNOSIS — N184 Chronic kidney disease, stage 4 (severe): Secondary | ICD-10-CM

## 2022-07-17 DIAGNOSIS — D631 Anemia in chronic kidney disease: Secondary | ICD-10-CM | POA: Diagnosis present

## 2022-07-17 DIAGNOSIS — R339 Retention of urine, unspecified: Secondary | ICD-10-CM | POA: Diagnosis not present

## 2022-07-17 DIAGNOSIS — B9689 Other specified bacterial agents as the cause of diseases classified elsewhere: Secondary | ICD-10-CM | POA: Diagnosis present

## 2022-07-17 DIAGNOSIS — R569 Unspecified convulsions: Secondary | ICD-10-CM

## 2022-07-17 DIAGNOSIS — L600 Ingrowing nail: Secondary | ICD-10-CM | POA: Diagnosis not present

## 2022-07-17 DIAGNOSIS — G9340 Encephalopathy, unspecified: Secondary | ICD-10-CM

## 2022-07-17 DIAGNOSIS — G40909 Epilepsy, unspecified, not intractable, without status epilepticus: Secondary | ICD-10-CM | POA: Diagnosis present

## 2022-07-17 DIAGNOSIS — H409 Unspecified glaucoma: Secondary | ICD-10-CM | POA: Diagnosis present

## 2022-07-17 DIAGNOSIS — Z1152 Encounter for screening for COVID-19: Secondary | ICD-10-CM | POA: Diagnosis not present

## 2022-07-17 DIAGNOSIS — F0390 Unspecified dementia without behavioral disturbance: Secondary | ICD-10-CM

## 2022-07-17 DIAGNOSIS — I10 Essential (primary) hypertension: Secondary | ICD-10-CM

## 2022-07-17 DIAGNOSIS — I5032 Chronic diastolic (congestive) heart failure: Secondary | ICD-10-CM | POA: Diagnosis present

## 2022-07-17 DIAGNOSIS — Z87891 Personal history of nicotine dependence: Secondary | ICD-10-CM | POA: Diagnosis not present

## 2022-07-17 DIAGNOSIS — K219 Gastro-esophageal reflux disease without esophagitis: Secondary | ICD-10-CM | POA: Diagnosis present

## 2022-07-17 DIAGNOSIS — E872 Acidosis, unspecified: Secondary | ICD-10-CM | POA: Diagnosis present

## 2022-07-17 DIAGNOSIS — Z794 Long term (current) use of insulin: Secondary | ICD-10-CM | POA: Diagnosis not present

## 2022-07-17 DIAGNOSIS — N281 Cyst of kidney, acquired: Secondary | ICD-10-CM | POA: Diagnosis present

## 2022-07-17 DIAGNOSIS — E669 Obesity, unspecified: Secondary | ICD-10-CM | POA: Diagnosis present

## 2022-07-17 DIAGNOSIS — N178 Other acute kidney failure: Secondary | ICD-10-CM | POA: Diagnosis not present

## 2022-07-17 LAB — CBC WITH DIFFERENTIAL/PLATELET
Abs Immature Granulocytes: 0.07 10*3/uL (ref 0.00–0.07)
Basophils Absolute: 0 10*3/uL (ref 0.0–0.1)
Basophils Relative: 0 %
Eosinophils Absolute: 0.1 10*3/uL (ref 0.0–0.5)
Eosinophils Relative: 1 %
HCT: 31 % — ABNORMAL LOW (ref 36.0–46.0)
Hemoglobin: 9.8 g/dL — ABNORMAL LOW (ref 12.0–15.0)
Immature Granulocytes: 1 %
Lymphocytes Relative: 5 %
Lymphs Abs: 0.5 10*3/uL — ABNORMAL LOW (ref 0.7–4.0)
MCH: 29.8 pg (ref 26.0–34.0)
MCHC: 31.6 g/dL (ref 30.0–36.0)
MCV: 94.2 fL (ref 80.0–100.0)
Monocytes Absolute: 0.9 10*3/uL (ref 0.1–1.0)
Monocytes Relative: 9 %
Neutro Abs: 8.7 10*3/uL — ABNORMAL HIGH (ref 1.7–7.7)
Neutrophils Relative %: 84 %
Platelets: 370 10*3/uL (ref 150–400)
RBC: 3.29 MIL/uL — ABNORMAL LOW (ref 3.87–5.11)
RDW: 18.2 % — ABNORMAL HIGH (ref 11.5–15.5)
WBC: 10.3 10*3/uL (ref 4.0–10.5)
nRBC: 0 % (ref 0.0–0.2)

## 2022-07-17 LAB — BLOOD CULTURE ID PANEL (REFLEXED) - BCID2

## 2022-07-17 LAB — URINALYSIS, ROUTINE W REFLEX MICROSCOPIC
Bacteria, UA: NONE SEEN
Bilirubin Urine: NEGATIVE
Glucose, UA: NEGATIVE mg/dL
Ketones, ur: NEGATIVE mg/dL
Nitrite: NEGATIVE
Protein, ur: >=300 mg/dL — AB
RBC / HPF: >50 RBC/hpf — ABNORMAL HIGH (ref 0–5)
Specific Gravity, Urine: 1.013 (ref 1.005–1.030)
WBC, UA: >50 WBC/hpf — ABNORMAL HIGH (ref 0–5)
pH: 6 (ref 5.0–8.0)

## 2022-07-17 LAB — COMPREHENSIVE METABOLIC PANEL
ALT: 22 U/L (ref 0–44)
AST: 40 U/L (ref 15–41)
Albumin: 2.1 g/dL — ABNORMAL LOW (ref 3.5–5.0)
Alkaline Phosphatase: 119 U/L (ref 38–126)
Anion gap: 13 (ref 5–15)
BUN: 92 mg/dL — ABNORMAL HIGH (ref 8–23)
CO2: 17 mmol/L — ABNORMAL LOW (ref 22–32)
Calcium: 8.7 mg/dL — ABNORMAL LOW (ref 8.9–10.3)
Chloride: 100 mmol/L (ref 98–111)
Creatinine, Ser: 5.93 mg/dL — ABNORMAL HIGH (ref 0.44–1.00)
GFR, Estimated: 6 mL/min — ABNORMAL LOW (ref >60)
Glucose, Bld: 190 mg/dL — ABNORMAL HIGH (ref 70–99)
Potassium: 5.8 mmol/L — ABNORMAL HIGH (ref 3.5–5.1)
Sodium: 130 mmol/L — ABNORMAL LOW (ref 135–145)
Total Bilirubin: 0.4 mg/dL (ref 0.3–1.2)
Total Protein: 6.9 g/dL (ref 6.5–8.1)

## 2022-07-17 LAB — CBG MONITORING, ED
Glucose-Capillary: 109 mg/dL — ABNORMAL HIGH (ref 70–99)
Glucose-Capillary: 121 mg/dL — ABNORMAL HIGH (ref 70–99)
Glucose-Capillary: 133 mg/dL — ABNORMAL HIGH (ref 70–99)
Glucose-Capillary: 144 mg/dL — ABNORMAL HIGH (ref 70–99)
Glucose-Capillary: 165 mg/dL — ABNORMAL HIGH (ref 70–99)
Glucose-Capillary: 81 mg/dL (ref 70–99)
Glucose-Capillary: 93 mg/dL (ref 70–99)
Glucose-Capillary: 94 mg/dL (ref 70–99)

## 2022-07-17 LAB — BASIC METABOLIC PANEL
Anion gap: 14 (ref 5–15)
BUN: 93 mg/dL — ABNORMAL HIGH (ref 8–23)
CO2: 14 mmol/L — ABNORMAL LOW (ref 22–32)
Calcium: 8.4 mg/dL — ABNORMAL LOW (ref 8.9–10.3)
Chloride: 104 mmol/L (ref 98–111)
Creatinine, Ser: 5.59 mg/dL — ABNORMAL HIGH (ref 0.44–1.00)
GFR, Estimated: 7 mL/min — ABNORMAL LOW (ref >60)
Glucose, Bld: 150 mg/dL — ABNORMAL HIGH (ref 70–99)
Potassium: 5.7 mmol/L — ABNORMAL HIGH (ref 3.5–5.1)
Sodium: 132 mmol/L — ABNORMAL LOW (ref 135–145)

## 2022-07-17 LAB — I-STAT VENOUS BLOOD GAS, ED
Acid-base deficit: 8 mmol/L — ABNORMAL HIGH (ref 0.0–2.0)
Bicarbonate: 18.1 mmol/L — ABNORMAL LOW (ref 20.0–28.0)
Calcium, Ion: 1.14 mmol/L — ABNORMAL LOW (ref 1.15–1.40)
HCT: 31 % — ABNORMAL LOW (ref 36.0–46.0)
Hemoglobin: 10.5 g/dL — ABNORMAL LOW (ref 12.0–15.0)
O2 Saturation: 85 %
Potassium: 5.8 mmol/L — ABNORMAL HIGH (ref 3.5–5.1)
Sodium: 130 mmol/L — ABNORMAL LOW (ref 135–145)
TCO2: 19 mmol/L — ABNORMAL LOW (ref 22–32)
pCO2, Ven: 38.5 mmHg — ABNORMAL LOW (ref 44–60)
pH, Ven: 7.282 (ref 7.25–7.43)
pO2, Ven: 55 mmHg — ABNORMAL HIGH (ref 32–45)

## 2022-07-17 LAB — RESP PANEL BY RT-PCR (RSV, FLU A&B, COVID)  RVPGX2
Influenza A by PCR: NEGATIVE
Influenza B by PCR: NEGATIVE
Resp Syncytial Virus by PCR: NEGATIVE
SARS Coronavirus 2 by RT PCR: NEGATIVE

## 2022-07-17 LAB — CBC
HCT: 31.8 % — ABNORMAL LOW (ref 36.0–46.0)
Hemoglobin: 9.3 g/dL — ABNORMAL LOW (ref 12.0–15.0)
MCH: 30.1 pg (ref 26.0–34.0)
MCHC: 29.2 g/dL — ABNORMAL LOW (ref 30.0–36.0)
MCV: 102.9 fL — ABNORMAL HIGH (ref 80.0–100.0)
Platelets: 283 10*3/uL (ref 150–400)
RBC: 3.09 MIL/uL — ABNORMAL LOW (ref 3.87–5.11)
RDW: 18.6 % — ABNORMAL HIGH (ref 11.5–15.5)
WBC: 8.5 10*3/uL (ref 4.0–10.5)
nRBC: 0 % (ref 0.0–0.2)

## 2022-07-17 LAB — PHOSPHORUS: Phosphorus: 6.8 mg/dL — ABNORMAL HIGH (ref 2.5–4.6)

## 2022-07-17 LAB — APTT: aPTT: 44 seconds — ABNORMAL HIGH (ref 24–36)

## 2022-07-17 LAB — TROPONIN I (HIGH SENSITIVITY)
Troponin I (High Sensitivity): 46 ng/L — ABNORMAL HIGH (ref <18)
Troponin I (High Sensitivity): 47 ng/L — ABNORMAL HIGH (ref <18)

## 2022-07-17 LAB — LACTIC ACID, PLASMA: Lactic Acid, Venous: 1.2 mmol/L (ref 0.5–1.9)

## 2022-07-17 LAB — PROTIME-INR
INR: 2.2 — ABNORMAL HIGH (ref 0.8–1.2)
Prothrombin Time: 24.4 seconds — ABNORMAL HIGH (ref 11.4–15.2)

## 2022-07-17 LAB — MAGNESIUM: Magnesium: 1.9 mg/dL (ref 1.7–2.4)

## 2022-07-17 MED ORDER — HEPARIN SODIUM (PORCINE) 5000 UNIT/ML IJ SOLN
5000.0000 [IU] | Freq: Three times a day (TID) | INTRAMUSCULAR | Status: DC
  Administered 2022-07-17 – 2022-07-26 (×27): 5000 [IU] via SUBCUTANEOUS
  Filled 2022-07-17 (×27): qty 1

## 2022-07-17 MED ORDER — STERILE WATER FOR INJECTION IV SOLN
INTRAVENOUS | Status: AC
  Filled 2022-07-17 (×2): qty 1000

## 2022-07-17 MED ORDER — ACETAMINOPHEN 325 MG PO TABS
650.0000 mg | ORAL_TABLET | Freq: Four times a day (QID) | ORAL | Status: DC | PRN
  Administered 2022-07-17 – 2022-07-18 (×5): 650 mg via ORAL
  Filled 2022-07-17 (×6): qty 2

## 2022-07-17 MED ORDER — SODIUM CHLORIDE 0.9% FLUSH
3.0000 mL | Freq: Two times a day (BID) | INTRAVENOUS | Status: DC
  Administered 2022-07-17 – 2022-07-22 (×12): 3 mL via INTRAVENOUS

## 2022-07-17 MED ORDER — SODIUM CHLORIDE 0.9 % IV SOLN: INTRAVENOUS | Status: DC

## 2022-07-17 MED ORDER — GABAPENTIN 100 MG PO CAPS: 100.0000 mg | ORAL_CAPSULE | Freq: Two times a day (BID) | ORAL | Status: DC

## 2022-07-17 MED ORDER — ONDANSETRON HCL 4 MG PO TABS
4.0000 mg | ORAL_TABLET | Freq: Four times a day (QID) | ORAL | Status: DC | PRN
  Administered 2022-07-23: 4 mg via ORAL
  Filled 2022-07-17: qty 1

## 2022-07-17 MED ORDER — GABAPENTIN 100 MG PO CAPS
100.0000 mg | ORAL_CAPSULE | Freq: Two times a day (BID) | ORAL | Status: DC
  Administered 2022-07-17 – 2022-07-26 (×18): 100 mg via ORAL
  Filled 2022-07-17 (×18): qty 1

## 2022-07-17 MED ORDER — ACETAMINOPHEN 650 MG RE SUPP: 650.0000 mg | Freq: Four times a day (QID) | RECTAL | Status: DC | PRN

## 2022-07-17 MED ORDER — SODIUM CHLORIDE 0.9 % IV SOLN
1.0000 g | Freq: Once | INTRAVENOUS | Status: AC
  Administered 2022-07-17: 1 g via INTRAVENOUS
  Filled 2022-07-17: qty 10

## 2022-07-17 MED ORDER — SODIUM CHLORIDE 0.9 % IV BOLUS (SEPSIS)
1000.0000 mL | Freq: Once | INTRAVENOUS | Status: AC
  Administered 2022-07-17: 1000 mL via INTRAVENOUS

## 2022-07-17 MED ORDER — ONDANSETRON HCL 4 MG/2ML IJ SOLN: 4.0000 mg | Freq: Four times a day (QID) | INTRAMUSCULAR | Status: DC | PRN

## 2022-07-17 MED ORDER — INSULIN ASPART 100 UNIT/ML IJ SOLN
0.0000 [IU] | INTRAMUSCULAR | Status: DC
  Administered 2022-07-22: 1 [IU] via SUBCUTANEOUS

## 2022-07-17 MED ORDER — OXYCODONE HCL 5 MG PO TABS
5.0000 mg | ORAL_TABLET | Freq: Four times a day (QID) | ORAL | Status: DC | PRN
  Administered 2022-07-17 – 2022-07-25 (×14): 5 mg via ORAL
  Filled 2022-07-17 (×16): qty 1

## 2022-07-17 MED ORDER — SENNOSIDES-DOCUSATE SODIUM 8.6-50 MG PO TABS: 1.0000 | ORAL_TABLET | Freq: Every evening | ORAL | Status: DC | PRN

## 2022-07-17 MED ORDER — LEVETIRACETAM 500 MG PO TABS
500.0000 mg | ORAL_TABLET | Freq: Every day | ORAL | Status: DC
  Administered 2022-07-17 – 2022-07-26 (×10): 500 mg via ORAL
  Filled 2022-07-17 (×10): qty 1

## 2022-07-17 MED ORDER — ACETAMINOPHEN 325 MG PO TABS: 650.0000 mg | ORAL_TABLET | Freq: Four times a day (QID) | ORAL | Status: DC | PRN

## 2022-07-17 MED ORDER — SODIUM CHLORIDE 0.9 % IV SOLN: 2.0000 g | Freq: Once | INTRAVENOUS | Status: DC

## 2022-07-17 MED ORDER — SODIUM CHLORIDE 0.9 % IV SOLN
1.0000 g | INTRAVENOUS | Status: DC
  Administered 2022-07-17 – 2022-07-18 (×2): 1 g via INTRAVENOUS
  Filled 2022-07-17 (×3): qty 10

## 2022-07-17 NOTE — ED Notes (Signed)
Shift report received, assumed care of patient at this time 

## 2022-07-17 NOTE — ED Notes (Signed)
Encouraged patient to take a few bites of meal.

## 2022-07-17 NOTE — H&P (Addendum)
History and Physical    MCKINZEE SPIRITO YTK:[id number] DOB: 1928/10/12 DOA: 07/16/2022  PCP: Seward Carol, MD   Patient coming from: SNF   Chief Complaint: Lethargic, confused   HPI: [name] is a 88 y.o. female with medical history significant for dementia, type 2 diabetes mellitus, hypertension, chronic diastolic CHF, CKD stage IV, seizure, and right distal femur fracture status post surgical repair on 07/03/2022, now presenting with lethargy and confusion.  Patient's daughter is at the bedside and assists with the history.  Patient began complaining that she needed to urinate 3 days ago and has been repeating this complaint frequently since then.  She was noted to become more lethargic and then yesterday was more confused and unable to recognize her daughter.  Aside from needing to void, the patient has not voiced any complaints.    Her recent admission was complicated by acute kidney injury which improved significantly with IV fluid prior to discharge, Klebsiella and E. coli UTI for which she completed treatment with Rocephin during the hospitalization, and acute hypoxic respiratory failure that was attributed to atelectasis.  ED Course: Upon arrival to the ED, patient is found to have a rectal temp of 37.8 C, saturation of 100% on 2 L/min of supplemental oxygen, normal heart rate, and systolic blood pressure of 103 and greater.  There were no acute findings on head CT. CT of the abdomen pelvis demonstrates marked distention of the urinary bladder with intraluminal air and possibly air within the bladder wall, mild bilateral hydronephrosis, and no obstructive calculi identified.  Potassium was 5.8, bicarbonate 17, BUN 92, and creatinine 5.93.  Lactic acid reassuringly normal.  Blood cultures were collected, urine culture was ordered, Foley catheter was placed and is draining, and she was given a liter of normal saline and 1 g IV Rocephin.  Review of Systems:  All other systems reviewed and  apart from HPI, are negative.  Past Medical History:  Diagnosis Date   Dementia ([place])    Diabetes mellitus    GERD (gastroesophageal reflux disease)    Glaucoma    Heart disease    Hypertension    Tinnitus     Past Surgical History:  Procedure Laterality Date   BREAST LUMPECTOMY     bil for breast ca   ORIF FEMUR FRACTURE Right 07/03/2022   Procedure: OPEN REDUCTION INTERNAL FIXATION (ORIF) DISTAL FEMUR FRACTURE;  Surgeon: Altamese South Eliot, MD;  Location: [facility];  Service: Orthopedics;  Laterality: Right;    Social History:   reports that she has quit smoking. She has never used smokeless tobacco. She reports that she does not drink alcohol and does not use drugs.  Allergies  Allergen Reactions   Ace Inhibitors Swelling, Rash and Cough   Prandin [Repaglinide] Other (See Comments)    Caused significant peripheral edema   Dilaudid [Hydromorphone Hcl] Itching and Other (See Comments)    Hallucinations (auditory and visual) also   Other Nausea And Vomiting    "Tussionex Pennkinetic ER"   Latex Rash   Tape Rash    Prefers PAPER TAPE, PLEASE!!    Family History  Problem Relation Age of Onset   Diabetes Sister      Prior to Admission medications   Medication Sig Start Date End Date Taking? Authorizing Provider  acetaminophen (TYLENOL) 325 MG tablet Take 650 mg by mouth every 6 (six) hours as needed for moderate pain.    [provider]  allopurinol (ZYLOPRIM) 100 MG tablet Take 100 mg by  mouth 2 (two) times daily.    [provider]  amLODipine (NORVASC) 10 MG tablet Take 10 mg by mouth daily.    [provider]  apixaban (ELIQUIS) 2.5 MG TABS tablet Take 1 tablet (2.5 mg total) by mouth 2 (two) times daily. 07/11/22   Lisette Abu, PA-C  blood glucose meter kit and supplies KIT Dispense based on patient and insurance preference. Use up to four times daily as directed. (FOR ICD-9 250.00, 250.01). For QAC - HS accuchecks. 11/27/20   Thurnell Lose, MD  Continuous Blood Gluc Sensor (FREESTYLE LIBRE 2 SENSOR) MISC USE AS DIRECTED. REAPPLY EVERY 14 DAYS Patient taking differently: Inject 1 Device into the skin every 14 (fourteen) days. 11/20/21   Renato Shin, MD  docusate sodium (COLACE) 100 MG capsule Take 1 capsule (100 mg total) by mouth 2 (two) times daily. 07/11/22   Aline August, MD  gabapentin (NEURONTIN) 100 MG capsule Take 100 mg by mouth 2 (two) times daily.    [provider]  guaiFENesin (MUCINEX PO) Take 2 tablets by mouth daily.    [provider]  HYDRALAZINE HCL PO Take 1 tablet by mouth 3 (three) times daily.    [provider]  Insulin Syringe-Needle U-100 25G X 1" 1 ML MISC For 4 times a day insulin SQ, 1 month supply. Diagnosis E11.65 11/27/20   Thurnell Lose, MD  levETIRAcetam (KEPPRA) 500 MG tablet Take 500 mg by mouth in the morning.    [provider]  methocarbamol (ROBAXIN) 500 MG tablet Take 1 tablet (500 mg total) by mouth every 6 (six) hours as needed for muscle spasms. 07/11/22   Lisette Abu, PA-C  metoprolol tartrate (LOPRESSOR) 100 MG tablet Take 100 mg by mouth 2 (two) times daily. 08/03/20   [provider]  mirtazapine (REMERON) 15 MG tablet Take 15 mg by mouth at bedtime.     [provider]  Multiple Vitamins-Minerals (CENTRUM SILVER 50+WOMEN) TABS Take 1 tablet by mouth daily with breakfast.    [provider]  omeprazole (PRILOSEC) 20 MG capsule Take 20 mg by mouth every Monday, Wednesday, and Friday.    [provider]  oxyCODONE (OXY IR/ROXICODONE) 5 MG immediate release tablet Take 1 tablet (5 mg total) by mouth every 6 (six) hours as needed for up to 7 days for breakthrough pain ((for MODERATE breakthrough pain)). 07/11/22 07/18/22  Lisette Abu, PA-C  QUEtiapine (SEROQUEL) 25 MG tablet Take 25 mg by mouth at bedtime. 12/01/20   [provider]    Physical Exam: Vitals:   07/17/22 0200 07/17/22 0230  07/17/22 0300 07/17/22 0330  BP: (!) 131/93 128/72 (!) 126/58 (!) 103/46  Pulse: 74 75 76 72  Resp: 16 (!) 22 (!) 23 13  Temp:      TempSrc:      SpO2: 98% 100% 99% 97%  Weight:      Height:        Constitutional: NAD, no pallor or diaphoresis   Eyes: PERTLA, lids and conjunctivae normal ENMT: Mucous membranes are dry. Posterior pharynx clear of any exudate or lesions.   Neck: supple, no masses  Respiratory: no wheezing, no crackles. No accessory muscle use.  Cardiovascular: S1 & S2 heard, regular rate and rhythm. Mild bilateral LE edema. Abdomen: No distension, soft, no rebound pain or guarding. Bowel sounds active.  Musculoskeletal: no clubbing / cyanosis. No joint deformity upper and lower extremities.   Skin: no significant rashes, lesions, ulcers.  Warm, dry, well-perfused. Neurologic: CN 2-12 grossly intact. Moving all extremities. Sleeping, wakes briefly to voice.     Labs and Imaging on Admission: I have personally reviewed following labs and imaging studies  CBC: Recent Labs  Lab 07/16/22 2349 07/16/22 2359  WBC 10.3  --   NEUTROABS 8.7*  --   HGB 9.8* 10.5*  HCT 31.0* 31.0*  MCV 94.2  --   PLT 370  --    Basic Metabolic Panel: Recent Labs  Lab 07/16/22 2349 07/16/22 2359  NA 130* 130*  K 5.8* 5.8*  CL 100  --   CO2 17*  --   GLUCOSE 190*  --   BUN 92*  --   CREATININE 5.93*  --   CALCIUM 8.7*  --    GFR: Estimated Creatinine Clearance: 5.8 mL/min (A) (by C-G formula based on SCr of 5.93 mg/dL (H)). Liver Function Tests: Recent Labs  Lab 07/16/22 2349  AST 40  ALT 22  ALKPHOS 119  BILITOT 0.4  PROT 6.9  ALBUMIN 2.1*   No results for input(s): "LIPASE", "AMYLASE" in the last 168 hours. No results for input(s): "AMMONIA" in the last 168 hours. Coagulation Profile: Recent Labs  Lab 07/16/22 2349  INR 2.2*   Cardiac Enzymes: No results for input(s): "CKTOTAL", "CKMB", "CKMBINDEX", "TROPONINI" in the last 168 hours. BNP (last 3 results) No  results for input(s): "PROBNP" in the last 8760 hours. HbA1C: No results for input(s): "HGBA1C" in the last 72 hours. CBG: Recent Labs  Lab 07/10/22 1615 07/10/22 2038 07/11/22 0737 07/11/22 1037 07/17/22 0017  GLUCAP 133* 145* 159* 148* 165*   Lipid Profile: No results for input(s): "CHOL", "HDL", "LDLCALC", "TRIG", "CHOLHDL", "LDLDIRECT" in the last 72 hours. Thyroid Function Tests: No results for input(s): "TSH", "T4TOTAL", "FREET4", "T3FREE", "THYROIDAB" in the last 72 hours. Anemia Panel: No results for input(s): "VITAMINB12", "FOLATE", "FERRITIN", "TIBC", "IRON", "RETICCTPCT" in the last 72 hours. Urine analysis:    Component Value Date/Time   COLORURINE YELLOW 07/17/2022 0155   APPEARANCEUR TURBID (A) 07/17/2022 0155   LABSPEC 1.013 07/17/2022 0155   PHURINE 6.0 07/17/2022 0155   GLUCOSEU NEGATIVE 07/17/2022 0155   HGBUR MODERATE (A) 07/17/2022 0155   BILIRUBINUR NEGATIVE 07/17/2022 0155   KETONESUR NEGATIVE 07/17/2022 0155   PROTEINUR >=300 (A) 07/17/2022 0155   UROBILINOGEN 0.2 11/21/2008 1820   NITRITE NEGATIVE 07/17/2022 0155   LEUKOCYTESUR MODERATE (A) 07/17/2022 0155   Sepsis Labs: _0 (procalcitonin:4,lacticidven:4) ) Recent Results (from the past 240 hour(s))  Resp panel by RT-PCR (RSV, Flu A&B, Covid) Anterior Nasal Swab     Status: None   Collection Time: 07/16/22 11:49 PM   Specimen: Anterior Nasal Swab  Result Value Ref Range Status   SARS Coronavirus 2 by RT PCR NEGATIVE NEGATIVE Final    Comment: (NOTE) SARS-CoV-2 target nucleic acids are NOT DETECTED.  The SARS-CoV-2 RNA is generally detectable in upper respiratory specimens during the acute phase of infection. The lowest concentration of SARS-CoV-2 viral copies this assay can detect is 138 copies/mL. A negative result does not preclude SARS-Cov-2 infection and should not be used as the sole basis for treatment or other patient management decisions. A negative result may occur with   improper specimen collection/handling, submission of specimen other than nasopharyngeal swab, presence of viral mutation(s) within the areas targeted by this assay, and inadequate number of viral copies(<138 copies/mL). A negative result must be combined with clinical observations, patient history, and epidemiological information. The expected result is Negative.  Fact  Sheet for Patients:  EntrepreneurPulse.com.au  Fact Sheet for Healthcare Providers:  IncredibleEmployment.be  This test is no t yet approved or cleared by the [place] FDA and  has been authorized for detection and/or diagnosis of SARS-CoV-2 by FDA under an Emergency Use Authorization (EUA). This EUA will remain  in effect (meaning this test can be used) for the duration of the COVID-19 declaration under Section 564(b)(1) of the Act, 21 U.S.C.section 360bbb-3(b)(1), unless the authorization is terminated  or revoked sooner.       Influenza A by PCR NEGATIVE NEGATIVE Final   Influenza B by PCR NEGATIVE NEGATIVE Final    Comment: (NOTE) The Xpert Xpress SARS-CoV-2/FLU/RSV plus assay is intended as an aid in the diagnosis of influenza from Nasopharyngeal swab specimens and should not be used as a sole basis for treatment. Nasal washings and aspirates are unacceptable for Xpert Xpress SARS-CoV-2/FLU/RSV testing.  Fact Sheet for Patients: EntrepreneurPulse.com.au  Fact Sheet for Healthcare Providers: IncredibleEmployment.be  This test is not yet approved or cleared by the [place] FDA and has been authorized for detection and/or diagnosis of SARS-CoV-2 by FDA under an Emergency Use Authorization (EUA). This EUA will remain in effect (meaning this test can be used) for the duration of the COVID-19 declaration under Section 564(b)(1) of the Act, 21 U.S.C. section 360bbb-3(b)(1), unless the authorization is terminated or revoked.      Resp Syncytial Virus by PCR NEGATIVE NEGATIVE Final    Comment: (NOTE) Fact Sheet for Patients: EntrepreneurPulse.com.au  Fact Sheet for Healthcare Providers: IncredibleEmployment.be  This test is not yet approved or cleared by the [place] FDA and has been authorized for detection and/or diagnosis of SARS-CoV-2 by FDA under an Emergency Use Authorization (EUA). This EUA will remain in effect (meaning this test can be used) for the duration of the COVID-19 declaration under Section 564(b)(1) of the Act, 21 U.S.C. section 360bbb-3(b)(1), unless the authorization is terminated or revoked.  Performed at [facility], [place] [address], [place], [place] [address]      Radiological Exams on Admission: CT ABDOMEN PELVIS WO CONTRAST  Result Date: 07/17/2022 CLINICAL DATA:  Acute abdominal pain EXAM: CT ABDOMEN AND PELVIS WITHOUT CONTRAST TECHNIQUE: Multidetector CT imaging of the abdomen and pelvis was performed following the standard protocol without IV contrast. RADIATION DOSE REDUCTION: This exam was performed according to the departmental dose-optimization program which includes automated exposure control, adjustment of the mA and/or kV according to patient size and/or use of iterative reconstruction technique. COMPARISON:  None Available. FINDINGS: Lower chest: The heart is enlarged. There is atelectasis in the lung bases. Hepatobiliary: No focal liver abnormality is seen. Status post cholecystectomy. No biliary dilatation. Pancreas: Unremarkable. No pancreatic ductal dilatation or surrounding inflammatory changes. Spleen: Normal in size without focal abnormality. Adrenals/Urinary Tract: The bladder is markedly distended. There is a small amount of air in the bladder and questionable air in the bladder wall anteriorly image 3/62 and inflammatory stranding surrounding the anterior portion of the bladder. There is mild bilateral  hydroureteronephrosis. No obstructing calculi visualized. There is a mildly complex left renal cyst with thin peripheral calcifications measuring 4.5 cm in diameter. The adrenal glands are within normal limits. Stomach/Bowel: Stomach is within normal limits. Appendix appears normal. No evidence of bowel wall thickening, distention, or inflammatory changes. Small bowel anastomosis is present. Vascular/Lymphatic: Aortic atherosclerosis. No enlarged abdominal or pelvic lymph nodes. Reproductive: Status post hysterectomy. No adnexal masses. Other: No abdominal wall hernia or abnormality. No abdominopelvic ascites.  Body wall edema present. Musculoskeletal: Bones are diffusely osteopenic. There is increased trabeculation in the right hemipelvis and within the L4 vertebral body which may be related to Paget's disease. IMPRESSION: 1. Markedly distended bladder with air in the bladder and questionable air in the bladder wall. There is surrounding inflammatory stranding. 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 2. Mild bilateral hydroureteronephrosis, likely secondary to bladder distension. No obstructing calculi visualized. 3. Mildly complex left renal cyst measuring 4.5 cm. Recommend follow-up ultrasound in 6 months to confirm stability. Aortic Atherosclerosis (ICD10-I70.0). Electronically Signed   By: Ronney Asters M.D.   On: 07/17/2022 01:51   CT Head Wo Contrast  Result Date: 07/17/2022 CLINICAL DATA:  Delirium. EXAM: CT HEAD WITHOUT CONTRAST TECHNIQUE: Contiguous axial images were obtained from the base of the skull through the vertex without intravenous contrast. RADIATION DOSE REDUCTION: This exam was performed according to the departmental dose-optimization program which includes automated exposure control, adjustment of the mA and/or kV according to patient size and/or use of iterative reconstruction technique. COMPARISON:  Head CT 07/01/2022.  MRI brain 05/02/2005. FINDINGS: Brain: No evidence of  acute infarction, hemorrhage, hydrocephalus, extra-axial collection or mass lesion/mass effect. There stable mild periventricular white matter hypodensity, likely chronic small vessel ischemic change. Vascular: Atherosclerotic calcifications are present within the cavernous internal carotid arteries. Skull: Normal. Negative for fracture or focal lesion. Sinuses/Orbits: No acute finding. Other: None. IMPRESSION: No acute intracranial abnormality. Electronically Signed   By: Ronney Asters M.D.   On: 07/17/2022 01:41   DG Chest Port 1 View  Result Date: 07/16/2022 CLINICAL DATA:  Questionable sepsis - evaluate for abnormality EXAM: PORTABLE CHEST 1 VIEW COMPARISON:  chest x-ray 07/02/2022 FINDINGS: The heart and mediastinal contours are unchanged. Aortic calcification No focal consolidation. No pulmonary edema. No pleural effusion. No pneumothorax. No acute osseous abnormality. Bilateral shoulder degenerative changes. IMPRESSION: 1. No active disease. 2.  Aortic Atherosclerosis (ICD10-I70.0). Electronically Signed   By: Iven Finn M.D.   On: 07/16/2022 23:41    EKG: Independently reviewed. Sinus rhythm, RBBB.   Assessment/Plan   1. Emphysematous cystitis  - Presents with difficulty voiding and progressive lethargy and confusion and is found to have emphysematous cystitis with AUR  - She was cultured, given 1 liter IVF, and started on antibiotics in ED  - Treat with cefepime for now, follow cultures and clinical response to treatment    2. AKI superimposed on CKD IV; hyperkalemia; metabolic acidosis  - BUN is 92 and SCr 5.93 on admission, up from 34 & 1.81 a week earlier; potassium is 5.8 and serum bicarb 17  - She has marked bladder distension and mild b/l hydro on CT without obstructing calculi  - Foley placed in ED and is draining  - Continue Foley cathter, renally-dose medications, continue IVF hydration with isotonic bicarbonate infusion, repeat chem panel in am    3. Acute encephalopathy   - No acute findings on head CT  - Improved in ED after bladder decompression and IVF  - Likely secondary to cystitis, AUR, metabolic derangements  - Expand workup if fails to resolve as expected with treatment of infection and AKI    4. Type II DM  - A1c was 6.4% in August 2023  - Check CBGs and use low-intensity SSI    5. Hypertension  - Antihypertensives held on admission given concern for developing sepsis and SBP low 100s  6. Dementia  - Delirium precautions  - Seroquel held on admission d/t somnolence  7. Hx of seizure  - Continue Keppra    8. Complex renal cyst  - Mildly complex left renal cyst noted on CT in ED  - Outpatient follow-up recommended with [place] in 6 months    DVT prophylaxis: sq heparin   Code Status: Full  Level of Care: Level of care: Progressive Family Communication: Daughter at bedside Disposition Plan:  Patient is from: SNF  Anticipated d/c is to: SNF  Anticipated d/c date is: 07/20/22  Patient currently: Pending improved/stable renal function, cultures  Consults called: none  Admission status: Inpatient     Vianne Bulls, MD [facility]  07/17/2022, 3:56 AM

## 2022-07-17 NOTE — Progress Notes (Signed)
Pharmacy Antibiotic Note  [name] is a 88 y.o. female admitted on 07/16/2022 with AMS, AKI, and possible UTI.  Pharmacy has been consulted for Cefepime dosing.  Plan: Cefepime 1 g IV q24h  Height: '5\' 1"'$  (154.9 cm) Weight: 77.1 kg (170 lb) IBW/kg (Calculated) : 47.8  Temp (24hrs), Avg:100 F (37.8 C), Min:100 F (37.8 C), Max:100 F (37.8 C)  Recent Labs  Lab 07/16/22 2349  WBC 10.3  CREATININE 5.93*  LATICACIDVEN 1.2    Estimated Creatinine Clearance: 5.8 mL/min (A) (by C-G formula based on SCr of 5.93 mg/dL (H)).    Allergies  Allergen Reactions   Ace Inhibitors Swelling, Rash and Cough   Prandin [Repaglinide] Other (See Comments)    Caused significant peripheral edema   Dilaudid [Hydromorphone Hcl] Itching and Other (See Comments)    Hallucinations (auditory and visual) also   Other Nausea And Vomiting    "Tussionex Pennkinetic ER"   Latex Rash   Tape Rash    Prefers PAPER TAPE, PLEASE!!    Caryl Pina 07/17/2022 3:57 AM

## 2022-07-17 NOTE — ED Notes (Signed)
Cleaned patient, peri care provided, clean linen and gown placed on stretcher and patient.

## 2022-07-17 NOTE — Care Plan (Addendum)
This 88 years old female with PMH significant for dementia, type 2 diabetes, hypertension, chronic diastolic CHF, CKD stage IV, seizure disorder, right distal femur fracture s/p surgical repair on 07/03/2022 now presented with lethargy and confusion.  Daughter reports that patient was complaining about increased urinary frequency for last 3 days and later she became confused and was unable to recognize her daughter.  Workup in the ED CT abdomen pelvis shows marked distention of urinary bladder with intraluminal air and possibly ER within the bladder wall and mild bilateral hydronephrosis and no obstructive calculi identified.  Foley catheter was inserted and is draining well.  Patient reports improvement in symptoms.  Continue IV cefepime for now awaiting cultures.  AKI on CKD stage IV, continue IV hydration avoid nephrotoxic medications.  Continue Foley catheter.  Mental status significantly improved after insertion of Foley catheter.  Urology is consulted , case discussed with Dr. Bess Harvest, he states  that she does not need any surgical intervention.  Advised to continue antibiotics for 14 days, continue Foley catheter, follow-up cultures and outpatient follow-up.  Reconsult if patient decompensates.

## 2022-07-17 NOTE — Progress Notes (Signed)
PHARMACY - PHYSICIAN COMMUNICATION CRITICAL VALUE ALERT - BLOOD CULTURE IDENTIFICATION (BCID)  [name] is an 88 y.o. female who presented to [facility] from SNF on 07/16/2022 with a chief complaint of UTI symptoms.  WBC wnl  afebrile cr 5, crcl < 32m/min.  Current treatment cefepime 1gm IV q24h     Name of physician (or Provider) Contacted: none   Changes to prescribed antibiotics recommended:  Patient is on recommended antibiotics - No changes needed   LBonnita NasutiPharm.D. CPP, BCPS Clinical Pharmacist [phone]/19/2023 8:24 PM    Results for orders placed or performed during the hospital encounter of 07/16/22  Blood Culture ID Panel (Reflexed) (Collected: 07/16/2022 11:49 PM)  Result Value Ref Range   Enterococcus faecalis NOT DETECTED NOT DETECTED   Enterococcus Faecium NOT DETECTED NOT DETECTED   Listeria monocytogenes NOT DETECTED NOT DETECTED   Staphylococcus species NOT DETECTED NOT DETECTED   Staphylococcus aureus (BCID) NOT DETECTED NOT DETECTED   Staphylococcus epidermidis NOT DETECTED NOT DETECTED   Staphylococcus lugdunensis NOT DETECTED NOT DETECTED   Streptococcus species NOT DETECTED NOT DETECTED   Streptococcus agalactiae NOT DETECTED NOT DETECTED   Streptococcus pneumoniae NOT DETECTED NOT DETECTED   Streptococcus pyogenes NOT DETECTED NOT DETECTED   A.calcoaceticus-baumannii NOT DETECTED NOT DETECTED   Bacteroides fragilis NOT DETECTED NOT DETECTED   Enterobacterales DETECTED (A) NOT DETECTED   Enterobacter cloacae complex NOT DETECTED NOT DETECTED   Escherichia coli NOT DETECTED NOT DETECTED   Klebsiella aerogenes NOT DETECTED NOT DETECTED   Klebsiella oxytoca NOT DETECTED NOT DETECTED   Klebsiella pneumoniae NOT DETECTED NOT DETECTED   Proteus species NOT DETECTED NOT DETECTED   Salmonella species NOT DETECTED NOT DETECTED   Serratia marcescens NOT DETECTED NOT DETECTED   Haemophilus influenzae NOT DETECTED NOT DETECTED   Neisseria  meningitidis NOT DETECTED NOT DETECTED   Pseudomonas aeruginosa NOT DETECTED NOT DETECTED   Stenotrophomonas maltophilia NOT DETECTED NOT DETECTED   Candida albicans NOT DETECTED NOT DETECTED   Candida auris NOT DETECTED NOT DETECTED   Candida glabrata NOT DETECTED NOT DETECTED   Candida krusei NOT DETECTED NOT DETECTED   Candida parapsilosis NOT DETECTED NOT DETECTED   Candida tropicalis NOT DETECTED NOT DETECTED   Cryptococcus neoformans/gattii NOT DETECTED NOT DETECTED   CTX-M ESBL NOT DETECTED NOT DETECTED   Carbapenem resistance IMP NOT DETECTED NOT DETECTED   Carbapenem resistance KPC NOT DETECTED NOT DETECTED   Carbapenem resistance NDM NOT DETECTED NOT DETECTED   Carbapenem resist OXA 48 LIKE NOT DETECTED NOT DETECTED   Carbapenem resistance VIM NOT DETECTED NOT DETECTED

## 2022-07-18 DIAGNOSIS — N3080 Other cystitis without hematuria: Secondary | ICD-10-CM | POA: Diagnosis not present

## 2022-07-18 LAB — CBG MONITORING, ED
Glucose-Capillary: 91 mg/dL (ref 70–99)
Glucose-Capillary: 85 mg/dL (ref 70–99)
Glucose-Capillary: 84 mg/dL (ref 70–99)
Glucose-Capillary: 84 mg/dL (ref 70–99)
Glucose-Capillary: 81 mg/dL (ref 70–99)

## 2022-07-18 LAB — BASIC METABOLIC PANEL
Anion gap: 12 (ref 5–15)
BUN: 94 mg/dL — ABNORMAL HIGH (ref 8–23)
CO2: 22 mmol/L (ref 22–32)
Calcium: 8.5 mg/dL — ABNORMAL LOW (ref 8.9–10.3)
Chloride: 100 mmol/L (ref 98–111)
Creatinine, Ser: 5 mg/dL — ABNORMAL HIGH (ref 0.44–1.00)
GFR, Estimated: 8 mL/min — ABNORMAL LOW (ref >60)
Glucose, Bld: 84 mg/dL (ref 70–99)
Potassium: 4.7 mmol/L (ref 3.5–5.1)
Sodium: 134 mmol/L — ABNORMAL LOW (ref 135–145)

## 2022-07-18 LAB — CBC
HCT: 25 % — ABNORMAL LOW (ref 36.0–46.0)
Hemoglobin: 8.2 g/dL — ABNORMAL LOW (ref 12.0–15.0)
MCH: 30.1 pg (ref 26.0–34.0)
MCHC: 32.8 g/dL (ref 30.0–36.0)
MCV: 91.9 fL (ref 80.0–100.0)
Platelets: 318 10*3/uL (ref 150–400)
RBC: 2.72 MIL/uL — ABNORMAL LOW (ref 3.87–5.11)
RDW: 18.2 % — ABNORMAL HIGH (ref 11.5–15.5)
WBC: 7.2 10*3/uL (ref 4.0–10.5)
nRBC: 0 % (ref 0.0–0.2)

## 2022-07-18 LAB — GLUCOSE, CAPILLARY: Glucose-Capillary: 87 mg/dL (ref 70–99)

## 2022-07-18 LAB — MAGNESIUM: Magnesium: 2 mg/dL (ref 1.7–2.4)

## 2022-07-18 LAB — PHOSPHORUS: Phosphorus: 6.9 mg/dL — ABNORMAL HIGH (ref 2.5–4.6)

## 2022-07-18 NOTE — ED Notes (Signed)
Pts daughter at bedside.Updated on status. Pt A&O x2 at this time. Pt still anxious in room but redirectable. Pt cooperative with staff. Encouraged to drink fluids

## 2022-07-18 NOTE — Progress Notes (Signed)
Progress Note   Patient: [name] GGE:[id number] DOB: 09/08/1928 DOA: 07/16/2022     1 DOS: the patient was seen and examined on 07/18/2022   Brief hospital course:  [name] is a 88 y.o. female with medical history significant for dementia, type 2 diabetes mellitus, hypertension, chronic diastolic CHF, CKD stage IV, seizure, and right distal femur fracture status post surgical repair on 07/03/2022, now presenting with lethargy and confusion.   Patient's daughter is at the bedside and assists with the history.  Patient began complaining that she needed to urinate 3 days ago and has been repeating this complaint frequently since then.  She was noted to become more lethargic and then yesterday was more confused and unable to recognize her daughter.  Aside from needing to void, the patient has not voiced any complaints.     She was brought to the ER and found to have AKI, evidence of emphysematous UTI and urinary retention. Foley was placed, patient was started on empiric Cefepime and is improving.  Assessment and Plan: 1. Emphysematous cystitis  - Presents with difficulty voiding and progressive lethargy and confusion and is found to have emphysematous cystitis with AUR  - She was cultured, given 1 liter IVF, and started on antibiotics in ED  - Treat with cefepime for now, follow cultures and clinical response to treatment     2. AKI superimposed on CKD IV; hyperkalemia; metabolic acidosis  - BUN is 92 and SCr 5.93 on admission, up from 34 & 1.81 a week earlier; potassium is 5.8 and serum bicarb 17 on admission, these are all improving now - She has marked bladder distension and mild b/l hydro on CT without obstructing calculi  - Foley placed in ED and is draining well, patient denies retention/pain - Continue Foley cathter, renally-dose medications, continue IVF hydration with isotonic bicarbonate infusion, follow labs in AM   3. Acute encephalopathy  - No acute findings on head CT  -  Improved in ED after bladder decompression and IVF  - Likely secondary to cystitis, AUR, metabolic derangements  - further workup not indicated as patient is improving and approaching her normal baseline, per her daughter at the bedside   4. Type II DM  - A1c was 6.4% in August 2023  - Check CBGs and use low-intensity SSI     5. Hypertension  - Antihypertensives held on admission given concern for developing sepsis and SBP low 100s   6. Dementia  - Delirium precautions  - Seroquel held on admission d/t somnolence     7. Hx of seizure  - Continue Keppra     8. Complex renal cyst  - Mildly complex left renal cyst noted on CT in ED  - Outpatient follow-up recommended with [place] in 6 months   9. Bacteremia - blood culture from 12/18 shows pos for Enterobacterales - continue current Cefepime, obtain repeat blood cx x2 today 12/20    DVT prophylaxis: sq heparin   Code Status: Full  Level of Care: Level of care: Progressive Family Communication: Daughter at bedside, all questions answered. Disposition Plan:  Patient is from: SNF  Anticipated d/c is to: SNF  Anticipated d/c date is: 07/20/22  Patient currently: Pending improved/stable renal function, cultures  Consults called: Dr. Dwyane Dee d/w Urology on 12/19, will need outpatient FU Admission status: Inpatient       Subjective: Seen and examined with daughter, patient is feeling better, has no complaints now realizes she is in the hospital which  is an improvement for her. Eating some and foley remains in place.  Physical Exam: Vitals:   07/17/22 2332 07/18/22 0000 07/18/22 0100 07/18/22 0500  BP:  (!) 117/50 (!) 119/48 (!) 129/49  Pulse: 92 82 78 85  Resp: '17 19 13 10  '$ Temp:  98 F (36.7 C)  97.8 F (36.6 C)  TempSrc:  Oral  Axillary  SpO2: 99% 100% 97% 100%  Weight:      Height:      Physical Exam Constitutional:      General: She is not in acute distress.    Appearance: Normal appearance. She is not toxic-appearing.   HENT:     Head: Normocephalic and atraumatic.     Mouth/Throat:     Mouth: Mucous membranes are moist.  Eyes:     Pupils: Pupils are equal, round, and reactive to light.  Cardiovascular:     Rate and Rhythm: Normal rate and regular rhythm.     Heart sounds: Normal heart sounds. No murmur heard.    No friction rub.  Pulmonary:     Effort: Pulmonary effort is normal.     Breath sounds: Normal breath sounds. No wheezing.  Abdominal:     General: Abdomen is flat. Bowel sounds are normal.     Palpations: Abdomen is soft.  Musculoskeletal:        General: No swelling or tenderness.     Cervical back: Neck supple.  Skin:    General: Skin is warm and dry.  Neurological:     General: No focal deficit present.     Mental Status: She is alert and oriented to person, place, and time.  Psychiatric:        Mood and Affect: Mood normal.        Behavior: Behavior normal.     Data Reviewed:  There are no new results to review at this time.  Family Communication: Discussed with daughter at the bedside.  Disposition: Status is: Inpatient Remains inpatient appropriate because: Req IV abx, AKI, repeat cx pending  Planned Discharge Destination: Skilled nursing facility    Time spent: 42 minutes  Author: Sajid Ruppert Marry Guan, MD 07/18/2022 8:37 AM  For on call review [email or web address].

## 2022-07-18 NOTE — ED Notes (Signed)
Checked patient cbg it was 39 patient is resting with family at bedside and call bell in reach

## 2022-07-18 NOTE — ED Notes (Addendum)
Daughter in room requesting pain med for pt. Pt reports not experiencing any pain in her leg or her back at this time. Pt appears anxious  and uneasy. MD notified.  Daughter refused anxiety med for pt

## 2022-07-19 DIAGNOSIS — E118 Type 2 diabetes mellitus with unspecified complications: Secondary | ICD-10-CM | POA: Diagnosis not present

## 2022-07-19 LAB — CULTURE, BLOOD (ROUTINE X 2): Special Requests: ADEQUATE

## 2022-07-19 LAB — GLUCOSE, CAPILLARY
Glucose-Capillary: 75 mg/dL (ref 70–99)
Glucose-Capillary: 81 mg/dL (ref 70–99)
Glucose-Capillary: 83 mg/dL (ref 70–99)
Glucose-Capillary: 84 mg/dL (ref 70–99)
Glucose-Capillary: 94 mg/dL (ref 70–99)

## 2022-07-19 LAB — CBC
HCT: 27.6 % — ABNORMAL LOW (ref 36.0–46.0)
Hemoglobin: 8.7 g/dL — ABNORMAL LOW (ref 12.0–15.0)
MCH: 29 pg (ref 26.0–34.0)
MCHC: 31.5 g/dL (ref 30.0–36.0)
MCV: 92 fL (ref 80.0–100.0)
Platelets: 349 10*3/uL (ref 150–400)
RBC: 3 MIL/uL — ABNORMAL LOW (ref 3.87–5.11)
RDW: 18 % — ABNORMAL HIGH (ref 11.5–15.5)
WBC: 6.2 10*3/uL (ref 4.0–10.5)
nRBC: 0 % (ref 0.0–0.2)

## 2022-07-19 LAB — BASIC METABOLIC PANEL
Anion gap: 11 (ref 5–15)
BUN: 85 mg/dL — ABNORMAL HIGH (ref 8–23)
CO2: 19 mmol/L — ABNORMAL LOW (ref 22–32)
Calcium: 8.7 mg/dL — ABNORMAL LOW (ref 8.9–10.3)
Chloride: 102 mmol/L (ref 98–111)
Creatinine, Ser: 4.09 mg/dL — ABNORMAL HIGH (ref 0.44–1.00)
GFR, Estimated: 10 mL/min — ABNORMAL LOW (ref >60)
Glucose, Bld: 91 mg/dL (ref 70–99)
Potassium: 4.7 mmol/L (ref 3.5–5.1)
Sodium: 132 mmol/L — ABNORMAL LOW (ref 135–145)

## 2022-07-19 MED ORDER — CHLORHEXIDINE GLUCONATE CLOTH 2 % EX PADS
6.0000 | MEDICATED_PAD | Freq: Every day | CUTANEOUS | Status: DC
  Administered 2022-07-19 – 2022-07-26 (×8): 6 via TOPICAL

## 2022-07-19 MED ORDER — ACETAMINOPHEN 500 MG PO TABS
1000.0000 mg | ORAL_TABLET | Freq: Three times a day (TID) | ORAL | Status: DC | PRN
  Administered 2022-07-21 – 2022-07-24 (×7): 1000 mg via ORAL
  Filled 2022-07-19 (×9): qty 2

## 2022-07-19 MED ORDER — OXYBUTYNIN CHLORIDE 5 MG/5ML PO SOLN
5.0000 mg | Freq: Three times a day (TID) | ORAL | Status: DC
  Administered 2022-07-19 – 2022-07-21 (×7): 5 mg via ORAL
  Filled 2022-07-19 (×11): qty 5

## 2022-07-19 MED ORDER — SODIUM CHLORIDE 0.9 % IV SOLN
500.0000 mg | INTRAVENOUS | Status: DC
  Administered 2022-07-19 – 2022-07-21 (×3): 500 mg via INTRAVENOUS
  Filled 2022-07-19 (×3): qty 10

## 2022-07-19 NOTE — Progress Notes (Signed)
Progress Note   Patient: [name] IHK:[id number] DOB: 1929/03/07 DOA: 07/16/2022     2 DOS: the patient was seen and examined on 07/19/2022   Brief hospital course:  RHENDA OREGON is a 88 y.o. female with medical history significant for dementia, type 2 diabetes mellitus, hypertension, chronic diastolic CHF, CKD stage IV, seizure, and right distal femur fracture status post surgical repair on 07/03/2022, now presenting with lethargy and confusion.   Patient's daughter is at the bedside and assists with the history.  Patient began complaining that she needed to urinate 3 days ago and has been repeating this complaint frequently since then.  She was noted to become more lethargic and then yesterday was more confused and unable to recognize her daughter.  Aside from needing to void, the patient has not voiced any complaints.     She was brought to the ER and found to have AKI, evidence of emphysematous UTI and urinary retention. Foley was placed, patient was started on empiric Cefepime and is improving.  Assessment and Plan: 1. Emphysematous cystitis  - Presents with difficulty voiding and progressive lethargy and confusion and is found to have emphysematous cystitis with AUR  - She was cultured, given 1 liter IVF, and started on antibiotics in ED  - Treat with meropenem for now as mentioned below - urine cx pending, follow cultures and clinical response to treatment     2. AKI superimposed on CKD IV; hyperkalemia; metabolic acidosis  - BUN is 92 and SCr 5.93 on admission, cr down to 4 today was up from 34 & 1.81 a week earlier; potassium is 5.8 and serum bicarb 17 on admission, these are all improving now - She has marked bladder distension and mild b/l hydro on CT without obstructing calculi  - Foley placed in ED and is draining well, patient denies retention/pain - Continue Foley cathter, renally-dose medications, continue IVF hydration with isotonic bicarbonate infusion, follow labs in AM    3. Acute encephalopathy  - No acute findings on head CT  - Improved in ED after bladder decompression and IVF  - Likely secondary to cystitis, AUR, metabolic derangements  - further workup not indicated as patient is improving and approaching her normal baseline, per her daughter at the bedside   4. Type II DM  - A1c was 6.4% in August 2023  - Check CBGs and use low-intensity SSI     5. Hypertension  - Antihypertensives held on admission given concern for developing sepsis and SBP low 100s   6. Dementia  - Delirium precautions  - Seroquel held on admission d/t somnolence     7. Hx of seizure  - Continue Keppra     8. Complex renal cyst  - Mildly complex left renal cyst noted on CT in ED  - Outpatient follow-up recommended with [place] in 6 months   9. Bacteremia - blood culture from 12/18 shows pos for Enterobacterales, Hafnia alvei  - pending repeat blood cx x2 12/20 - change abx to Meropenem per resistance profile    DVT prophylaxis: sq heparin   Code Status: Full  Level of Care: Level of care: Progressive Family Communication: Daughter at bedside, all questions answered. Disposition Plan:  Patient is from: SNF  Anticipated d/c is to: SNF  Anticipated d/c date is: 07/20/22  Patient currently: Pending improved/stable renal function, cultures  Consults called: Dr. Dwyane Dee d/w Urology on 12/19, will need outpatient FU Admission status: Inpatient       Subjective:  Seen and examined with sitter, patient is feeling better intermittenly confused seems a little uncomfortable  Physical Exam: Vitals:   07/18/22 2000 07/18/22 2311 07/19/22 0403 07/19/22 0820  BP: (!) 140/61 137/89 (!) 131/46 (!) 152/54  Pulse: 83 89 83 92  Resp: (!) '28 20 12 '$ (!) 25  Temp:  (!) 97.5 F (36.4 C) 97.6 F (36.4 C) 97.8 F (36.6 C)  TempSrc:  Oral Oral Axillary  SpO2: 95% 100% 97% 98%  Weight:      Height:      Physical Exam Constitutional:      General: She is not in acute distress.     Appearance: Normal appearance. She is not toxic-appearing.  HENT:     Head: Normocephalic and atraumatic.     Mouth/Throat:     Mouth: Mucous membranes are moist.  Eyes:     Pupils: Pupils are equal, round, and reactive to light.  Cardiovascular:     Rate and Rhythm: Normal rate and regular rhythm.     Heart sounds: Normal heart sounds. No murmur heard.    No friction rub.  Pulmonary:     Effort: Pulmonary effort is normal.     Breath sounds: Normal breath sounds. No wheezing.  Abdominal:     General: Abdomen is flat. Bowel sounds are normal.     Palpations: Abdomen is soft.  Musculoskeletal:        General: No swelling or tenderness.     Cervical back: Neck supple.  Skin:    General: Skin is warm and dry.  Neurological:     General: No focal deficit present.     Mental Status: She is alert and oriented to person, place, and time.  Psychiatric:        Mood and Affect: Mood normal.        Behavior: Behavior normal.   Data Reviewed:  There are no new results to review at this time.  Family Communication: Discussed with sitter at the bedside, and with daughter on the phone as well, all questions answered.  Disposition: Status is: Inpatient Remains inpatient appropriate because: Req IV abx, AKI, repeat cx pending  Planned Discharge Destination: Skilled nursing facility   Time spent: 22 minutes  Author: Kissie Ziolkowski Marry Guan, MD 07/19/2022 10:23 AM  For on call review [email or web address].

## 2022-07-19 NOTE — Progress Notes (Signed)
Transition of Care Department [facility]) following patient for high risk of readmission.   Patient is admitted from [facility] for cystitis.  Transition of Care Department [facility]) has reviewed patient and we will continue to monitor patient advancement through interdisciplinary progression rounds.

## 2022-07-20 DIAGNOSIS — E118 Type 2 diabetes mellitus with unspecified complications: Secondary | ICD-10-CM | POA: Diagnosis not present

## 2022-07-20 LAB — CBC
HCT: 25.9 % — ABNORMAL LOW (ref 36.0–46.0)
Hemoglobin: 8.4 g/dL — ABNORMAL LOW (ref 12.0–15.0)
MCH: 30 pg (ref 26.0–34.0)
MCHC: 32.4 g/dL (ref 30.0–36.0)
MCV: 92.5 fL (ref 80.0–100.0)
Platelets: 323 10*3/uL (ref 150–400)
RBC: 2.8 MIL/uL — ABNORMAL LOW (ref 3.87–5.11)
RDW: 18 % — ABNORMAL HIGH (ref 11.5–15.5)
WBC: 6 10*3/uL (ref 4.0–10.5)
nRBC: 0 % (ref 0.0–0.2)

## 2022-07-20 LAB — BASIC METABOLIC PANEL
Anion gap: 11 (ref 5–15)
BUN: 81 mg/dL — ABNORMAL HIGH (ref 8–23)
CO2: 21 mmol/L — ABNORMAL LOW (ref 22–32)
Calcium: 8.7 mg/dL — ABNORMAL LOW (ref 8.9–10.3)
Chloride: 105 mmol/L (ref 98–111)
Creatinine, Ser: 3.24 mg/dL — ABNORMAL HIGH (ref 0.44–1.00)
GFR, Estimated: 13 mL/min — ABNORMAL LOW (ref >60)
Glucose, Bld: 84 mg/dL (ref 70–99)
Potassium: 5 mmol/L (ref 3.5–5.1)
Sodium: 137 mmol/L (ref 135–145)

## 2022-07-20 LAB — GLUCOSE, CAPILLARY
Glucose-Capillary: 81 mg/dL (ref 70–99)
Glucose-Capillary: 86 mg/dL (ref 70–99)
Glucose-Capillary: 87 mg/dL (ref 70–99)
Glucose-Capillary: 95 mg/dL (ref 70–99)
Glucose-Capillary: 92 mg/dL (ref 70–99)

## 2022-07-20 NOTE — Evaluation (Signed)
Speech Language Pathology Evaluation Patient Details Name: [name] MRN: [id number] DOB: 03/03/1929 Today's Date: 07/20/2022 Time: 0626-9485 SLP Time Calculation (min) (ACUTE ONLY): 37 min  Problem List:  Patient Active Problem List   Diagnosis Date Noted   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   Hyperkalemia 07/17/2022   Acute urinary retention 07/17/2022   Complex renal cyst 07/17/2022   Closed fracture of right distal femur (HCC) 06/30/2022   Chronic diastolic CHF (congestive heart failure) ([place]) 06/30/2022   history of seizures 06/30/2022   Acute respiratory failure with hypoxia ([place]) 06/30/2022   Acute on chronic anemia 06/30/2022   Dementia ([place]) 03/10/2022   Heart failure ([place]) 03/10/2022   AKI (acute kidney injury) ([place]) 03/10/2022   Acute renal failure superimposed on stage 4 chronic kidney disease ([place]) 03/10/2022   Type 2 diabetes mellitus with complication, without long-term current use of insulin (HCC)    Glaucoma    Hyperglycemia 11/24/2020   UTI (urinary tract infection) 11/24/2020   Acute encephalopathy 11/24/2020   GERD (gastroesophageal reflux disease)    Hypertension    Past Medical History:  Past Medical History:  Diagnosis Date   Dementia ([place])    Diabetes mellitus    GERD (gastroesophageal reflux disease)    Glaucoma    Heart disease    Hypertension    Tinnitus    Past Surgical History:  Past Surgical History:  Procedure Laterality Date   BREAST LUMPECTOMY     bil for breast ca   ORIF FEMUR FRACTURE Right 07/03/2022   Procedure: OPEN REDUCTION INTERNAL FIXATION (ORIF) DISTAL FEMUR FRACTURE;  Surgeon: Altamese Ashton, MD;  Location: [place];  Service: Orthopedics;  Laterality: Right;   HPI:  [name] is a 88 y.o. female with medical history significant for dementia, type 2 diabetes mellitus, hypertension, chronic diastolic CHF, CKD stage IV, seizure, and right distal femur fracture status post surgical repair on 07/03/2022. Underwent a period of  encephalopathy but per chart has mostly resolved. SLP order requested by family. Pt was evaluated previously this admission and found to be WNL and family in agreement.   Assessment / Plan / Recommendation Clinical Impression  Pt demonstrates significant decline in cognitive function impacting language and speech.  Pt typically is able to communicate without impairment, only has a mild dementia at baseline that impacts higher level functioning (medication management) Pt is dysarthric with strained high pitched vocal quailty. Her attention to verbal and basic functional tasks is absent to fleeting. Pt is in accurate in Y/N questions, often times does not follow commands, does not repeat words and her attempts to communicate are sometimes a language of confusion and sometime perseverative and very dysarthric and unintelligible. Would not describe function as an aphasia, more of a diffuse cognitive impairment impacting all aspects of function. Daughter at bedside is distressed and hoping to find a means to help her mother improve. SLP will f/u for opportunities to engage pt and progress communication, though likely an underlying medical derrangement is occuring that will need correction prior to expectation of improvement. Mobility with PT may be very helpful as well.    SLP Assessment  SLP Recommendation/Assessment: Patient needs continued Speech Lanaguage Pathology Services    Recommendations for follow up therapy are one component of a multi-disciplinary discharge planning process, led by the attending physician.  Recommendations may be updated based on patient status, additional functional criteria and insurance authorization.    Follow Up Recommendations  Skilled nursing-short term rehab (<3 hours/day)  Assistance Recommended at Discharge     Functional Status Assessment    Frequency and Duration min 1 x/week  2 weeks      SLP Evaluation Cognition  Overall Cognitive Status:  Impaired/Different from baseline Arousal/Alertness: Awake/alert Orientation Level: Disoriented to person;Disoriented to place;Disoriented to time;Disoriented to situation Attention: Focused Focused Attention: Impaired Focused Attention Impairment: Verbal basic;Functional basic       Comprehension  Auditory Comprehension Overall Auditory Comprehension: Impaired Yes/No Questions: Impaired Basic Biographical Questions: 0-25% accurate Commands: Impaired One Step Basic Commands: 0-24% accurate    Expression Verbal Expression Overall Verbal Expression: Impaired Initiation: Impaired Automatic Speech:  (none) Level of Generative/Spontaneous Verbalization: Word;Phrase Repetition: Impaired Level of Impairment: Word level Naming: Not tested Pragmatics: Impairment Impairments: Abnormal affect;Eye contact   Oral / Motor  Oral Motor/Sensory Function Overall Oral Motor/Sensory Function: Other (comment) (groping movement) Motor Speech Overall Motor Speech: Impaired Phonation: Other (comment) (strained and high pitched) Resonance: Within functional limits Articulation: Impaired Intelligibility: Intelligibility reduced Word: 75-100% accurate Phrase: 0-24% accurate            Shikira Folino, Katherene Ponto 07/20/2022, 1:36 PM

## 2022-07-20 NOTE — Plan of Care (Signed)
  Problem: Education: Goal: Verbalization of understanding the information provided (i.e., activity precautions, restrictions, etc) will improve Outcome: Progressing Goal: Individualized Educational Video(s) Outcome: Progressing   Problem: Activity: Goal: Ability to ambulate and perform ADLs will improve Outcome: Progressing   Problem: Clinical Measurements: Goal: Postoperative complications will be avoided or minimized Outcome: Progressing   Problem: Self-Concept: Goal: Ability to maintain and perform role responsibilities to the fullest extent possible will improve Outcome: Progressing   Problem: Pain Management: Goal: Pain level will decrease Outcome: Progressing   Problem: Education: Goal: Ability to describe self-care measures that may prevent or decrease complications (Diabetes Survival Skills Education) will improve Outcome: Progressing Goal: Individualized Educational Video(s) Outcome: Progressing   Problem: Coping: Goal: Ability to adjust to condition or change in health will improve Outcome: Progressing   Problem: Fluid Volume: Goal: Ability to maintain a balanced intake and output will improve Outcome: Progressing   Problem: Health Behavior/Discharge Planning: Goal: Ability to identify and utilize available resources and services will improve Outcome: Progressing Goal: Ability to manage health-related needs will improve Outcome: Progressing   Problem: Metabolic: Goal: Ability to maintain appropriate glucose levels will improve Outcome: Progressing   Problem: Nutritional: Goal: Maintenance of adequate nutrition will improve Outcome: Progressing Goal: Progress toward achieving an optimal weight will improve Outcome: Progressing   Problem: Skin Integrity: Goal: Risk for impaired skin integrity will decrease Outcome: Progressing   Problem: Tissue Perfusion: Goal: Adequacy of tissue perfusion will improve Outcome: Progressing   Problem: Fluid  Volume: Goal: Hemodynamic stability will improve Outcome: Progressing   Problem: Clinical Measurements: Goal: Diagnostic test results will improve Outcome: Progressing Goal: Signs and symptoms of infection will decrease Outcome: Progressing   Problem: Respiratory: Goal: Ability to maintain adequate ventilation will improve Outcome: Progressing   Problem: Education: Goal: Knowledge of General Education information will improve Description: Including pain rating scale, medication(s)/side effects and non-pharmacologic comfort measures Outcome: Progressing   Problem: Health Behavior/Discharge Planning: Goal: Ability to manage health-related needs will improve Outcome: Progressing   Problem: Clinical Measurements: Goal: Ability to maintain clinical measurements within normal limits will improve Outcome: Progressing Goal: Will remain free from infection Outcome: Progressing Goal: Diagnostic test results will improve Outcome: Progressing Goal: Respiratory complications will improve Outcome: Progressing Goal: Cardiovascular complication will be avoided Outcome: Progressing   Problem: Activity: Goal: Risk for activity intolerance will decrease Outcome: Progressing   Problem: Nutrition: Goal: Adequate nutrition will be maintained Outcome: Progressing   Problem: Coping: Goal: Level of anxiety will decrease Outcome: Progressing   Problem: Elimination: Goal: Will not experience complications related to bowel motility Outcome: Progressing Goal: Will not experience complications related to urinary retention Outcome: Progressing   Problem: Pain Managment: Goal: General experience of comfort will improve Outcome: Progressing   Problem: Safety: Goal: Ability to remain free from injury will improve Outcome: Progressing   Problem: Skin Integrity: Goal: Risk for impaired skin integrity will decrease Outcome: Progressing

## 2022-07-20 NOTE — Progress Notes (Signed)
Pt has not slept much tonight only a few hrs after pain medication was given, she is not willing to eat or drink anything, I've tried with a straw and without a straw, and she spits it back out, may have to evaluate her nutritional intake

## 2022-07-20 NOTE — Plan of Care (Signed)
Less 02 requirements  today

## 2022-07-20 NOTE — Evaluation (Signed)
Clinical/Bedside Swallow Evaluation Patient Details  Name: [name] MRN: [id number] Date of Birth: 04-12-1929  Today's Date: 07/20/2022 Time: SLP Start Time (ACUTE ONLY): 1194 SLP Stop Time (ACUTE ONLY): 1740 SLP Time Calculation (min) (ACUTE ONLY): 37 min  Past Medical History:  Past Medical History:  Diagnosis Date   Dementia ([facility])    Diabetes mellitus    GERD (gastroesophageal reflux disease)    Glaucoma    Heart disease    Hypertension    Tinnitus    Past Surgical History:  Past Surgical History:  Procedure Laterality Date   BREAST LUMPECTOMY     bil for breast ca   ORIF FEMUR FRACTURE Right 07/03/2022   Procedure: OPEN REDUCTION INTERNAL FIXATION (ORIF) DISTAL FEMUR FRACTURE;  Surgeon: Altamese [facility], MD;  Location: [facility];  Service: Orthopedics;  Laterality: Right;   HPI:  [name] is a 88 y.o. female with medical history significant for dementia, type 2 diabetes mellitus, hypertension, chronic diastolic CHF, CKD stage IV, seizure, and right distal femur fracture status post surgical repair on 07/03/2022. Underwent a period of encephalopathy but per chart has mostly resolved. SLP order requested by family. Pt was evaluated previously this admission and found to be WNL and family in agreement.    Assessment / Plan / Recommendation  Clinical Impression  Pt demonstrates cognitive impairment impacting swallowing. Pt is unable to feed herself, she does not focus attention to PO unless hand over hand assist is carefully given. She sometimes says no to offer of drink, but then after straw is touched to her lips she sips it and exclaims that it is good. When given banana pudding pt has oral holding and required verbal cues to complete oral prep and transit. Daughter believes pt will refuse PO, but also recognizes that she may not be capable of masticating regular solids. Will downgrade to dys 2 (fine chop) with thin liquids. Likely to need crushed meds.      Aspiration Risk        Diet Recommendation Dysphagia 2 (Fine chop);Thin liquid   Liquid Administration via: Straw Medication Administration: Crushed with puree Supervision: Full supervision/cueing for compensatory strategies Compensations: Slow rate;Small sips/bites Postural Changes: Seated upright at 90 degrees    Other  Recommendations      Recommendations for follow up therapy are one component of a multi-disciplinary discharge planning process, led by the attending physician.  Recommendations may be updated based on patient status, additional functional criteria and insurance authorization.  Follow up Recommendations Skilled nursing-short term rehab (<3 hours/day)      Assistance Recommended at Discharge    Functional Status Assessment    Frequency and Duration min 1 x/week          Prognosis        Swallow Study   General HPI: [name] is a 88 y.o. female with medical history significant for dementia, type 2 diabetes mellitus, hypertension, chronic diastolic CHF, CKD stage IV, seizure, and right distal femur fracture status post surgical repair on 07/03/2022. Underwent a period of encephalopathy but per chart has mostly resolved. SLP order requested by family. Pt was evaluated previously this admission and found to be WNL and family in agreement. Type of Study: Bedside Swallow Evaluation Diet Prior to this Study: Dysphagia 3 (soft);Thin liquids Temperature Spikes Noted: No Respiratory Status: Room air History of Recent Intubation: No Behavior/Cognition: Distractible;Requires cueing Oral Cavity Assessment: Dry Oral Care Completed by SLP: No Oral Cavity - Dentition: Adequate natural dentition  Vision: Impaired for self-feeding Self-Feeding Abilities: Needs assist Patient Positioning: Upright in bed Baseline Vocal Quality:  (hoarse, high pitched) Volitional Cough: Cognitively unable to elicit Volitional Swallow: Unable to elicit    Oral/Motor/Sensory Function Overall Oral Motor/Sensory  Function: Other (comment) (does not follow commands)   Ice Chips Ice chips: Not tested   Thin Liquid Thin Liquid: Within functional limits Presentation: Straw    Nectar Thick Nectar Thick Liquid: Not tested   Honey Thick Honey Thick Liquid: Not tested   Puree Puree: Impaired Presentation: Spoon Oral Phase Functional Implications: Prolonged oral transit Pharyngeal Phase Impairments: Suspected delayed Swallow   Solid     Solid: Not tested      Rhoda Waldvogel, Katherene Ponto 07/20/2022,1:49 PM

## 2022-07-20 NOTE — Progress Notes (Signed)
Progress Note   Patient: [name] OVZ:[id number] DOB: [birth year] DOA: 07/16/2022     3 DOS: the patient was seen and examined on 07/20/2022   Brief hospital course:  [name] is a 88 y.o. female with medical history significant for dementia, type 2 diabetes mellitus, hypertension, chronic diastolic CHF, CKD stage IV, seizure, and right distal femur fracture status post surgical repair on 07/03/2022, now presenting with lethargy and confusion.   Patient's daughter is at the bedside and assists with the history.  Patient began complaining that she needed to urinate 3 days ago and has been repeating this complaint frequently since then.  She was noted to become more lethargic and then yesterday was more confused and unable to recognize her daughter.  Aside from needing to void, the patient has not voiced any complaints.     She was brought to the ER and found to have AKI, evidence of emphysematous UTI and urinary retention. Foley was placed, patient was started on empiric Cefepime and is improving.  Assessment and Plan: 1. Emphysematous cystitis  - Presents with difficulty voiding and progressive lethargy and confusion and is found to have emphysematous cystitis with AUR  - She was cultured, given 1 liter IVF, and started on antibiotics in ED  - Treat with meropenem for now as mentioned below - urine cx pending, follow cultures and clinical response to treatment     2. AKI superimposed on CKD IV; hyperkalemia; metabolic acidosis  - BUN is 92 and SCr 5.93 on admission, cr down to 3.4 today was up from 34 & 1.81 a week earlier that is likely her baseline; potassium is 5.8 and serum bicarb 17 on admission, these are all improving now - She has marked bladder distension and mild b/l hydro on CT without obstructing calculi  - Foley placed in ED and is draining well, patient denies retention/pain - Continue Foley cathter, renally-dose medications, continue IVF hydration with isotonic bicarbonate  infusion, follow labs daily labs as Cr continues to improve   3. Acute encephalopathy  - No acute findings on head CT  - Improved in ED after bladder decompression and IVF  - Likely secondary to cystitis, AUR, metabolic derangements  - further workup not indicated as patient is improving and approaching her normal baseline, per her daughter at the bedside this AM - she has some concerns and requests SLP for cognitive eval and swallow eval   4. Type II DM  - A1c was 6.4% in August 2023  - Check CBGs and use low-intensity SSI     5. Hypertension  - Antihypertensives held on admission given concern for developing sepsis and SBP low 100s   6. Dementia  - Delirium precautions  - Seroquel held on admission d/t somnolence     7. Hx of seizure  - Continue Keppra     8. Complex renal cyst  - Mildly complex left renal cyst noted on CT in ED  - Outpatient follow-up recommended with [place] in 6 months   9. Bacteremia - blood culture from 12/18 shows pos for Enterobacterales, Hafnia alvei  - change abx to Meropenem per resistance profile    DVT prophylaxis: sq heparin   Code Status: Full  Level of Care: Level of care: Progressive Family Communication: Daughter at bedside, all questions answered. Disposition Plan:  Patient is from: SNF  Anticipated d/c is to: SNF  Anticipated d/c date is: 07/20/22  Patient currently: Pending improved/stable renal function, cultures  Consults called: Dr.  Dwyane Dee d/w Urology on 12/19, will need outpatient FU Admission status: Inpatient       Subjective: Seen and examined with daughter at the bedside  Physical Exam: Vitals:   07/19/22 2000 07/20/22 0000 07/20/22 0400 07/20/22 0800  BP: (!) 119/58 (!) 143/67 (!) 148/64 (!) 128/90  Pulse: 87 84 72 91  Resp: 12 17 (!) 22 (!) 23  Temp:    97.7 F (36.5 C)  TempSrc:    Axillary  SpO2: 97% 97% (!) 81% 96%  Weight:      Height:      Physical Exam Constitutional:      General: She is not in acute  distress.    Appearance: Normal appearance. She is not toxic-appearing.  HENT:     Head: Normocephalic and atraumatic.     Mouth/Throat:     Mouth: Mucous membranes are moist.  Eyes:     Pupils: Pupils are equal, round, and reactive to light.  Cardiovascular:     Rate and Rhythm: Normal rate and regular rhythm.     Heart sounds: Normal heart sounds. No murmur heard.    No friction rub.  Pulmonary:     Effort: Pulmonary effort is normal.     Breath sounds: Normal breath sounds. No wheezing.  Abdominal:     General: Abdomen is flat. Bowel sounds are normal.     Palpations: Abdomen is soft.  Musculoskeletal:        General: No swelling or tenderness.     Cervical back: Neck supple.  Skin:    General: Skin is warm and dry.  Neurological:     General: No focal deficit present.     Mental Status: She is alert and oriented to person, place, and time.  Psychiatric:        Mood and Affect: Mood normal.        Behavior: Behavior normal.   Data Reviewed:  There are no new results to review at this time.  Family Communication: Discussed daughter at the bedside.  Disposition: Status is: Inpatient Remains inpatient appropriate because: Req IV abx, AKI, repeat cx pending  Planned Discharge Destination: Family plans to take her home at [place]   Time spent: 22 minutes  Author: Shamecka Hocutt Marry Guan, MD 07/20/2022 9:14 AM  For on call review [email or web address].

## 2022-07-20 NOTE — Evaluation (Signed)
Physical Therapy Evaluation Patient Details Name: [name] MRN: [id number] DOB: 1928/11/02 Today's Date: 07/20/2022  History of Present Illness  88 y.o. female admitted 12/18 with Emphysematous cystitis, AKI superimposed on CKD IV; hyperkalemia; metabolic acidosis,   Acute encephalopathy. PMHx: dementia, type 2 diabetes mellitus, hypertension, chronic diastolic CHF, CKD stage IV, seizure, and right distal femur fracture status post surgical repair on 07/03/2022.  Clinical Impression  Pt admitted with above diagnosis. Unable to answer questions other than her name, with some "yes" responses appropriately provided at times during session. Required total assist for bed mobility. Pt anxious on EOB, required min assist at all times for balance on EOB. Does report pain in Rt thigh. Pt currently with functional limitations due to the deficits listed below (see PT Problem List). Pt will benefit from skilled PT to increase their independence and safety with mobility to allow discharge to the venue listed below.          Recommendations for follow up therapy are one component of a multi-disciplinary discharge planning process, led by the attending physician.  Recommendations may be updated based on patient status, additional functional criteria and insurance authorization.  Follow Up Recommendations Skilled nursing-short term rehab (<3 hours/day) Can patient physically be transported by private vehicle: No    Assistance Recommended at Discharge Frequent or constant Supervision/Assistance  Patient can return home with the following  Two people to help with walking and/or transfers;Two people to help with bathing/dressing/bathroom;Assistance with cooking/housework;Assistance with feeding;Direct supervision/assist for financial management;Direct supervision/assist for medications management;Assist for transportation;Help with stairs or ramp for entrance    Equipment Recommendations None recommended by PT   Recommendations for Other Services       Functional Status Assessment Patient has had a recent decline in their functional status and demonstrates the ability to make significant improvements in function in a reasonable and predictable amount of time.     Precautions / Restrictions Precautions Precautions: Fall Restrictions Weight Bearing Restrictions: Yes RLE Weight Bearing: Non weight bearing ("For 6 weeks from 12/5")      Mobility  Bed Mobility Overal bed mobility: Needs Assistance Bed Mobility: Supine to Sit, Sit to Supine     Supine to sit: Total assist, HOB elevated Sit to supine: Total assist   General bed mobility comments: Unable to provide assistance or follow commands for moving LEs out of bed. Assisted with trunk and LEs to sit EOB.    Transfers                   General transfer comment: Not appropriate due to safety    Ambulation/Gait                  Stairs            Wheelchair Mobility    Modified Rankin (Stroke Patients Only)       Balance Overall balance assessment: Needs assistance Sitting-balance support: Bilateral upper extremity supported, Feet supported Sitting balance-Leahy Scale: Poor Sitting balance - Comments: Patient tolerated sitting EOB approx 8 minutes working on postural control, leaning side to side, and returning to midline. Requires physical assist at all times, progressed to min assist for balance. Showing preferance for posterior and leftward instability. Postural control: Posterior lean, Left lateral lean                                   Pertinent Vitals/Pain Pain Assessment Pain  Assessment: PAINAD Breathing: normal Negative Vocalization: occasional moan/groan, low speech, negative/disapproving quality Facial Expression: smiling or inexpressive Body Language: tense, distressed pacing, fidgeting Consolability: no need to console PAINAD Score: 2 Pain Intervention(s): Monitored during  session, Repositioned    Home Living Family/patient expects to be discharged to:: Skilled nursing facility Living Arrangements: Children Available Help at Discharge: Family Type of Home: House Home Access: Stairs to enter Entrance Stairs-Rails: Left Entrance Stairs-Number of Steps: 5   Home Layout: Two level;Able to live on main level with bedroom/bathroom Home Equipment: Rollator (4 wheels);Cane - single point;Shower seat;BSC/3in1 Additional Comments: Information obtained from recent notes.    Prior Function Prior Level of Function : Needs assist;History of Falls (last six months)  Cognitive Assist : ADLs (cognitive)   ADLs (Cognitive): Intermittent cues Physical Assist : ADLs (physical)   ADLs (physical): Bathing Mobility Comments: Daughter needs to encourage use of Rollator (in unfamiliar environments) or cane (when wearing heels). ADLs Comments: [name] assistance with bathing. Able to dress herself. (Information submitted prior to this recent admission provided above.) Since last d/c pt was working with SNF with [facility] RLE status.)     Hand Dominance   Dominant Hand: Right    Extremity/Trunk Assessment   Upper Extremity Assessment Upper Extremity Assessment: Defer to OT evaluation    Lower Extremity Assessment Lower Extremity Assessment: Generalized weakness;Difficult to assess due to impaired cognition       Communication   Communication: HOH  Cognition Arousal/Alertness: Awake/alert Behavior During Therapy: Flat affect Overall Cognitive Status: No family/caregiver present to determine baseline cognitive functioning Area of Impairment: Orientation                 Orientation Level: Disoriented to, Place, Time, Situation (Provides her name only. Unable to state birthdate.)             General Comments: Responds intermittently to questsions, mostly "yes" to questions or repeats question asked.        General Comments General comments (skin  integrity, edema, etc.): SpO2 on 2L mid 90s. On room air, rapidly drops to mid 80s.    Exercises     Assessment/Plan    PT Assessment Patient needs continued PT services  PT Problem List Decreased strength;Cardiopulmonary status limiting activity;Pain;Decreased range of motion;Decreased balance;Decreased activity tolerance;Decreased safety awareness;Decreased knowledge of use of DME;Decreased mobility;Decreased knowledge of precautions;Decreased cognition       PT Treatment Interventions DME instruction;Functional mobility training;Therapeutic activities;Therapeutic exercise;Balance training;Neuromuscular re-education;Patient/family education;Cognitive remediation;Wheelchair mobility training    PT Goals (Current goals can be found in the Care Plan section)  Acute Rehab PT Goals Patient Stated Goal: none stated PT Goal Formulation: Patient unable to participate in goal setting Time For Goal Achievement: 08/03/22 Potential to Achieve Goals: Fair    Frequency Min 2X/week     Co-evaluation               AM-PAC PT "6 Clicks" Mobility  Outcome Measure Help needed turning from your back to your side while in a flat bed without using bedrails?: Total Help needed moving from lying on your back to sitting on the side of a flat bed without using bedrails?: Total Help needed moving to and from a bed to a chair (including a wheelchair)?: Total Help needed standing up from a chair using your arms (e.g., wheelchair or bedside chair)?: Total Help needed to walk in hospital room?: Total Help needed climbing 3-5 steps with a railing? : Total 6 Click Score: 6    End  of Session Equipment Utilized During Treatment: Gait belt;Oxygen Activity Tolerance: Other (comment) (Impaired due to cognition) Patient left: in bed;with bed alarm set;with call bell/phone within reach   PT Visit Diagnosis: Muscle weakness (generalized) (M62.81);Pain;Other abnormalities of gait and mobility  (R26.89);Difficulty in walking, not elsewhere classified (R26.2) Pain - Right/Left: Right Pain - part of body: Leg    Time: 1356-1420 PT Time Calculation (min) (ACUTE ONLY): 24 min   Charges:   PT Evaluation $PT Eval High Complexity: 1 High PT Treatments $Therapeutic Activity: 8-22 mins        Candie Mile, PT, DPT Physical Therapist [facility] [place] 07/20/2022, 3:26 PM

## 2022-07-20 NOTE — Care Management Important Message (Signed)
Important Message  Patient Details  Name: SHAREECE BULTMAN MRN: [id number] Date of Birth: 1928/11/26   Medicare Important Message Given:  Yes     Khyler Eschmann Montine Circle 07/20/2022, 3:29 PM

## 2022-07-21 DIAGNOSIS — E118 Type 2 diabetes mellitus with unspecified complications: Secondary | ICD-10-CM | POA: Diagnosis not present

## 2022-07-21 LAB — BASIC METABOLIC PANEL
Anion gap: 8 (ref 5–15)
BUN: 68 mg/dL — ABNORMAL HIGH (ref 8–23)
CO2: 23 mmol/L (ref 22–32)
Calcium: 9.4 mg/dL (ref 8.9–10.3)
Chloride: 109 mmol/L (ref 98–111)
Creatinine, Ser: 2.56 mg/dL — ABNORMAL HIGH (ref 0.44–1.00)
GFR, Estimated: 17 mL/min — ABNORMAL LOW (ref >60)
Glucose, Bld: 95 mg/dL (ref 70–99)
Potassium: 5 mmol/L (ref 3.5–5.1)
Sodium: 140 mmol/L (ref 135–145)

## 2022-07-21 LAB — CBC
HCT: 28.8 % — ABNORMAL LOW (ref 36.0–46.0)
Hemoglobin: 9.2 g/dL — ABNORMAL LOW (ref 12.0–15.0)
MCH: 30 pg (ref 26.0–34.0)
MCHC: 31.9 g/dL (ref 30.0–36.0)
MCV: 93.8 fL (ref 80.0–100.0)
Platelets: 340 10*3/uL (ref 150–400)
RBC: 3.07 MIL/uL — ABNORMAL LOW (ref 3.87–5.11)
RDW: 18 % — ABNORMAL HIGH (ref 11.5–15.5)
WBC: 5.7 10*3/uL (ref 4.0–10.5)
nRBC: 0 % (ref 0.0–0.2)

## 2022-07-21 LAB — GLUCOSE, CAPILLARY
Glucose-Capillary: 87 mg/dL (ref 70–99)
Glucose-Capillary: 101 mg/dL — ABNORMAL HIGH (ref 70–99)
Glucose-Capillary: 83 mg/dL (ref 70–99)
Glucose-Capillary: 91 mg/dL (ref 70–99)
Glucose-Capillary: 116 mg/dL — ABNORMAL HIGH (ref 70–99)
Glucose-Capillary: 153 mg/dL — ABNORMAL HIGH (ref 70–99)

## 2022-07-21 LAB — URINE CULTURE: Culture: >=100000 — AB

## 2022-07-21 MED ORDER — SODIUM CHLORIDE 0.9 % IV SOLN
500.0000 mg | Freq: Two times a day (BID) | INTRAVENOUS | Status: DC
  Administered 2022-07-21: 500 mg via INTRAVENOUS
  Filled 2022-07-21 (×3): qty 10

## 2022-07-21 NOTE — Progress Notes (Signed)
PHARMACY NOTE:  ANTIMICROBIAL RENAL DOSAGE ADJUSTMENT  Current antimicrobial regimen includes a mismatch between antimicrobial dosage and estimated renal function.  As per policy approved by the Pharmacy & Therapeutics and Medical Executive Committees, the antimicrobial dosage will be adjusted accordingly.  Current antimicrobial dosage:  meropenem 500 mg IV Q24H   Indication: Hafnia alvei bacteremia   Renal Function:  Estimated Creatinine Clearance: 13.4 mL/min (A) (by C-G formula based on SCr of 2.56 mg/dL (H)). '[]'$      On intermittent HD, scheduled: '[]'$      On CRRT    Antimicrobial dosage has been changed to:  meropenem 500 mg IV Q12H   Additional comments:   Thank you for allowing pharmacy to be a part of this patient's care.  Adria Dill, PharmD PGY-2 Infectious Diseases Resident  07/21/2022 2:41 PM

## 2022-07-21 NOTE — Plan of Care (Signed)
  Problem: Education: Goal: Verbalization of understanding the information provided (i.e., activity precautions, restrictions, etc) will improve Outcome: Progressing Goal: Individualized Educational Video(s) Outcome: Progressing   

## 2022-07-21 NOTE — Plan of Care (Signed)
  Problem: Education: Goal: Verbalization of understanding the information provided (i.e., activity precautions, restrictions, etc) will improve Outcome: Progressing Goal: Individualized Educational Video(s) Outcome: Progressing   Problem: Activity: Goal: Ability to ambulate and perform ADLs will improve Outcome: Progressing   Problem: Clinical Measurements: Goal: Postoperative complications will be avoided or minimized Outcome: Progressing   Problem: Self-Concept: Goal: Ability to maintain and perform role responsibilities to the fullest extent possible will improve Outcome: Progressing   Problem: Pain Management: Goal: Pain level will decrease Outcome: Progressing   Problem: Education: Goal: Ability to describe self-care measures that may prevent or decrease complications (Diabetes Survival Skills Education) will improve Outcome: Progressing Goal: Individualized Educational Video(s) Outcome: Progressing   Problem: Coping: Goal: Ability to adjust to condition or change in health will improve Outcome: Progressing   Problem: Fluid Volume: Goal: Ability to maintain a balanced intake and output will improve Outcome: Progressing   Problem: Health Behavior/Discharge Planning: Goal: Ability to identify and utilize available resources and services will improve Outcome: Progressing Goal: Ability to manage health-related needs will improve Outcome: Progressing   Problem: Metabolic: Goal: Ability to maintain appropriate glucose levels will improve Outcome: Progressing   Problem: Nutritional: Goal: Maintenance of adequate nutrition will improve Outcome: Progressing Goal: Progress toward achieving an optimal weight will improve Outcome: Progressing   Problem: Skin Integrity: Goal: Risk for impaired skin integrity will decrease Outcome: Progressing   Problem: Tissue Perfusion: Goal: Adequacy of tissue perfusion will improve Outcome: Progressing   Problem: Fluid  Volume: Goal: Hemodynamic stability will improve Outcome: Progressing   Problem: Clinical Measurements: Goal: Diagnostic test results will improve Outcome: Progressing Goal: Signs and symptoms of infection will decrease Outcome: Progressing   Problem: Respiratory: Goal: Ability to maintain adequate ventilation will improve Outcome: Progressing   Problem: Education: Goal: Knowledge of General Education information will improve Description: Including pain rating scale, medication(s)/side effects and non-pharmacologic comfort measures Outcome: Progressing   Problem: Health Behavior/Discharge Planning: Goal: Ability to manage health-related needs will improve Outcome: Progressing   Problem: Clinical Measurements: Goal: Ability to maintain clinical measurements within normal limits will improve Outcome: Progressing Goal: Will remain free from infection Outcome: Progressing Goal: Diagnostic test results will improve Outcome: Progressing Goal: Respiratory complications will improve Outcome: Progressing Goal: Cardiovascular complication will be avoided Outcome: Progressing   Problem: Activity: Goal: Risk for activity intolerance will decrease Outcome: Progressing   Problem: Nutrition: Goal: Adequate nutrition will be maintained Outcome: Progressing   Problem: Coping: Goal: Level of anxiety will decrease Outcome: Progressing   Problem: Elimination: Goal: Will not experience complications related to bowel motility Outcome: Progressing Goal: Will not experience complications related to urinary retention Outcome: Progressing   Problem: Pain Managment: Goal: General experience of comfort will improve Outcome: Progressing   Problem: Safety: Goal: Ability to remain free from injury will improve Outcome: Progressing   Problem: Skin Integrity: Goal: Risk for impaired skin integrity will decrease Outcome: Progressing

## 2022-07-21 NOTE — Progress Notes (Signed)
Progress Note   Patient: [name] HCW:[id number] DOB: 1928-08-20 DOA: 07/16/2022     4 DOS: the patient was seen and examined on 07/21/2022   Brief hospital course:  [name] is a 88 y.o. female with medical history significant for dementia, type 2 diabetes mellitus, hypertension, chronic diastolic CHF, CKD stage IV, seizure, and right distal femur fracture status post surgical repair on 07/03/2022, now presenting with lethargy and confusion.   Patient's daughter is at the bedside and assists with the history.  Patient began complaining that she needed to urinate 3 days ago and has been repeating this complaint frequently since then.  She was noted to become more lethargic and then yesterday was more confused and unable to recognize her daughter.  Aside from needing to void, the patient has not voiced any complaints.     She was brought to the ER and found to have AKI, evidence of emphysematous UTI and urinary retention. Foley was placed, patient was started on empiric Cefepime and is improving.  Assessment and Plan: 1. Emphysematous cystitis  - Presents with difficulty voiding and progressive lethargy and confusion and is found to have emphysematous cystitis with AUR  - She was cultured, given 1 liter IVF, and started on antibiotics in ED  - Treat with meropenem for now as mentioned below - follow cultures and clinical response to treatment     2. AKI superimposed on CKD IV; hyperkalemia; metabolic acidosis  - BUN is 92 and SCr 5.93 on admission, cr down to 2.56 today was up from 34 & 1.81 a week earlier that is likely her baseline; potassium is 5.8 and serum bicarb 17 on admission, these are all improving now - She has marked bladder distension and mild b/l hydro on CT without obstructing calculi  - Foley placed in ED and is draining well, patient denies retention/pain - Continue Foley catheter, renally-dose medications, continue IVF hydration with isotonic bicarbonate infusion, follow  labs daily labs as Cr continues to improve will need outpatient Urology follow up after DC   3. Acute encephalopathy  - No acute findings on head CT  - Improved in ED after bladder decompression and IVF  - Likely secondary to cystitis, AUR, metabolic derangements  - further workup not indicated as patient is improving and approaching her normal baseline, per her daughter  - she has some concerns and requests SLP for cognitive eval and swallow eval, this has been done   4. Type II DM  - A1c was 6.4% in August 2023  - Check CBGs and use low-intensity SSI     5. Hypertension  - Antihypertensives held on admission given concern for developing sepsis and SBP low 100s   6. Dementia  - Delirium precautions  - Seroquel held on admission d/t somnolence     7. Hx of seizure  - Continue Keppra     8. Complex renal cyst  - Mildly complex left renal cyst noted on CT in ED  - Outpatient follow-up recommended with [place] in 6 months   9. Bacteremia - blood culture from 12/18 shows pos for Enterobacterales, Hafnia alvei  - change abx to Meropenem per resistance profile    DVT prophylaxis: sq heparin   Code Status: Full  Level of Care: Level of care: Progressive Family Communication: Daughter at bedside, all questions answered. Disposition Plan:  Patient is from: SNF  Anticipated d/c is to: Home per family preference. Anticipated d/c date is: 07/20/22  Patient currently: Pending improved/stable  renal function, cultures  Consults called: Dr. Dwyane Dee d/w Urology on 12/19, will need outpatient FU Admission status: Inpatient       Subjective: Seen and examined no family at the bedside this AM.  Physical Exam: Vitals:   07/20/22 2000 07/21/22 0000 07/21/22 0747 07/21/22 0904  BP: (!) 158/58 (!) 147/53 (!) 163/63 138/77  Pulse: 86 82 95 92  Resp: (!) '22 11 18 17  '$ Temp: 97.8 F (36.6 C)  98 F (36.7 C)   TempSrc: Oral     SpO2: 95% 92% 95% 98%  Weight:      Height:      Physical  Exam Constitutional:      General: She is not in acute distress.    Appearance: Normal appearance. She is not toxic-appearing.  HENT:     Head: Normocephalic and atraumatic.     Mouth/Throat:     Mouth: Mucous membranes are moist.  Eyes:     Pupils: Pupils are equal, round, and reactive to light.  Cardiovascular:     Rate and Rhythm: Normal rate and regular rhythm.     Heart sounds: Normal heart sounds. No murmur heard.    No friction rub.  Pulmonary:     Effort: Pulmonary effort is normal.     Breath sounds: Normal breath sounds. No wheezing.  Abdominal:     General: Abdomen is flat. Bowel sounds are normal.     Palpations: Abdomen is soft.  Musculoskeletal:        General: No swelling or tenderness.     Cervical back: Neck supple.  Skin:    General: Skin is warm and dry.  Neurological:     General: No focal deficit present.     Mental Status: She is alert and oriented to person, place, and time.  Psychiatric:        Mood and Affect: Mood normal.        Behavior: Behavior normal.   Data Reviewed:  There are no new results to review at this time.  Family Communication: Discussed daughter at the bedside on 12/22  Disposition: Status is: Inpatient Remains inpatient appropriate because: Req IV abx, AKI, repeat cx pending  Planned Discharge Destination: Family plans to take her home at DC   Time spent: 22 minutes  Author: Josip Merolla Marry Guan, MD 07/21/2022 10:01 AM  For on call review [email or web address].

## 2022-07-22 DIAGNOSIS — E860 Dehydration: Secondary | ICD-10-CM

## 2022-07-22 DIAGNOSIS — I10 Essential (primary) hypertension: Secondary | ICD-10-CM

## 2022-07-22 DIAGNOSIS — N178 Other acute kidney failure: Secondary | ICD-10-CM

## 2022-07-22 DIAGNOSIS — R410 Disorientation, unspecified: Principal | ICD-10-CM

## 2022-07-22 DIAGNOSIS — R339 Retention of urine, unspecified: Secondary | ICD-10-CM | POA: Diagnosis not present

## 2022-07-22 DIAGNOSIS — Z8781 Personal history of (healed) traumatic fracture: Secondary | ICD-10-CM

## 2022-07-22 LAB — GLUCOSE, CAPILLARY
Glucose-Capillary: 104 mg/dL — ABNORMAL HIGH (ref 70–99)
Glucose-Capillary: 152 mg/dL — ABNORMAL HIGH (ref 70–99)
Glucose-Capillary: 109 mg/dL — ABNORMAL HIGH (ref 70–99)
Glucose-Capillary: 153 mg/dL — ABNORMAL HIGH (ref 70–99)
Glucose-Capillary: 147 mg/dL — ABNORMAL HIGH (ref 70–99)

## 2022-07-22 LAB — CBC
HCT: 29.5 % — ABNORMAL LOW (ref 36.0–46.0)
Hemoglobin: 9.3 g/dL — ABNORMAL LOW (ref 12.0–15.0)
MCH: 29.8 pg (ref 26.0–34.0)
MCHC: 31.5 g/dL (ref 30.0–36.0)
MCV: 94.6 fL (ref 80.0–100.0)
Platelets: 328 10*3/uL (ref 150–400)
RBC: 3.12 MIL/uL — ABNORMAL LOW (ref 3.87–5.11)
RDW: 18 % — ABNORMAL HIGH (ref 11.5–15.5)
WBC: 6.4 10*3/uL (ref 4.0–10.5)
nRBC: 0 % (ref 0.0–0.2)

## 2022-07-22 LAB — BASIC METABOLIC PANEL
Anion gap: 10 (ref 5–15)
BUN: 46 mg/dL — ABNORMAL HIGH (ref 8–23)
CO2: 24 mmol/L (ref 22–32)
Calcium: 9.7 mg/dL (ref 8.9–10.3)
Chloride: 105 mmol/L (ref 98–111)
Creatinine, Ser: 1.9 mg/dL — ABNORMAL HIGH (ref 0.44–1.00)
GFR, Estimated: 25 mL/min — ABNORMAL LOW (ref >60)
Glucose, Bld: 98 mg/dL (ref 70–99)
Potassium: 4.7 mmol/L (ref 3.5–5.1)
Sodium: 139 mmol/L (ref 135–145)

## 2022-07-22 MED ORDER — HYDRALAZINE HCL 10 MG PO TABS
10.0000 mg | ORAL_TABLET | Freq: Three times a day (TID) | ORAL | Status: DC
  Administered 2022-07-22 – 2022-07-26 (×11): 10 mg via ORAL
  Filled 2022-07-22 (×12): qty 1

## 2022-07-22 MED ORDER — CIPROFLOXACIN HCL 500 MG PO TABS
500.0000 mg | ORAL_TABLET | Freq: Every day | ORAL | Status: DC
  Administered 2022-07-23 – 2022-07-26 (×4): 500 mg via ORAL
  Filled 2022-07-22 (×4): qty 1

## 2022-07-22 MED ORDER — METOPROLOL TARTRATE 100 MG PO TABS
100.0000 mg | ORAL_TABLET | Freq: Two times a day (BID) | ORAL | Status: DC
  Administered 2022-07-22 – 2022-07-26 (×9): 100 mg via ORAL
  Filled 2022-07-22 (×9): qty 1

## 2022-07-22 MED ORDER — CIPROFLOXACIN IN D5W 200 MG/100ML IV SOLN
200.0000 mg | Freq: Two times a day (BID) | INTRAVENOUS | Status: DC
  Administered 2022-07-22: 200 mg via INTRAVENOUS
  Filled 2022-07-22 (×2): qty 100

## 2022-07-22 MED ORDER — AMLODIPINE BESYLATE 10 MG PO TABS
10.0000 mg | ORAL_TABLET | Freq: Every day | ORAL | Status: DC
  Administered 2022-07-22 – 2022-07-26 (×5): 10 mg via ORAL
  Filled 2022-07-22 (×5): qty 1

## 2022-07-22 NOTE — Progress Notes (Signed)
PROGRESS NOTE  [name]    DOB: 05-Jan-1929, 88 y.o.  MPN:[id number]    Code Status: Full Code   DOA: 07/16/2022   LOS: 5   Brief hospital course  [name] is a 88 y.o. female with a PMH significant for dementia, type 2 diabetes mellitus, hypertension, chronic diastolic CHF, CKD stage IV, seizure, and right distal femur fracture status post surgical repair on 07/03/2022 .  They presented from SNF to the ED on 07/16/2022 with AMS x 1 days. She also complained of urinary retention x3 days. She had recently been treated for UTI with rocephin on prior hospitalization.   In the ED, it was found that they had a rectal temp of 37.8 C, saturation of 100% on 2 L/min of supplemental oxygen, normal heart rate, and systolic blood pressure of 103 and greater.  Significant findings included no acute findings on head CT. CT of the abdomen pelvis demonstrates marked distention of the urinary bladder with intraluminal air and possibly air within the bladder wall, mild bilateral hydronephrosis, and no obstructive calculi identified.  Potassium was 5.8, bicarbonate 17, BUN 92, and creatinine 5.93.  Lactic acid reassuringly normal. Blood cultures were collected, urine culture was ordered   They were initially treated with foley catheter, IVF, and IV rocephin.   Patient was admitted to medicine service for further workup and management of acute urinary retention as outlined in detail below.  07/22/22 -stable  Assessment & Plan  Principal Problem:   Acute renal failure superimposed on stage 4 chronic kidney disease (HCC) Active Problems:   Chronic diastolic CHF (congestive heart failure) (HCC)   Type 2 diabetes mellitus with complication, without long-term current use of insulin ([place])   Hypertension   history of seizures   Dementia ([place])   Acute encephalopathy   Emphysematous cystitis   Hyperkalemia   Acute urinary retention   Complex renal cyst  Emphysematous cystitis- in setting of acute urinary  retention. UxCx growing Hafnia alvei sensitive to cipro. Same with blood cultures. - discontinue oxybutynin  - change Abx to cipro. Patient unable to tolerate PIV so changed to PO. She is afebrile and alert/oriented and normal WBC count   AKI superimposed on CKD IV  hyperkalemia  metabolic acidosis  K+ WNL. Cr is returned close to baseline. Taking PO so fluids discontinued - continue foley catheter - repeat renal US to ensure resolution of hydronephrosis at future time - monitor electrolytes and replete PRN   Acute encephalopathy- resolving. Patient close to baseline. No acute findings on head CT. Daughter states she is greatly improved today. - PT/OT evaluation - delirium precautions  Home meds include eliquis for DVT ppx following recent surgery. Held on admission. - on lovenox ppx   Type II DM- A1c was 6.4% in August 2023 which is well below goal for her age. Is not insuline dependent at baseline - stop sliding scale. Monitor blood glucose with labs and stat for suspected hyper/hypoglycemia    Hypertension - Antihypertensives held on admission given concern for developing sepsis and SBP low 100s - restart home antihypertensives now developing elevated systolics   Dementia  - Delirium precautions  - Seroquel held on admission d/t somnolence     Hx of seizure  - Continue Keppra     Complex renal cyst  - Mildly complex left renal cyst noted on CT in ED  - Outpatient follow-up recommended with [place] in 6 months  Body mass index is 30.7 kg/m.  VTE ppx: heparin  injection 5,000 Units Start: 07/17/22 0600  Diet:     Diet   DIET DYS 2 Room service appropriate? No; Fluid consistency: Thin   Consultants: None   Subjective 07/22/22    Pt reports no complaints today. She feels well. Denies SOB.   Objective   Vitals:   07/22/22 0220 07/22/22 0418 07/22/22 0450 07/22/22 0625  BP: (!) 161/71 (!) 173/74  (!) 149/56  Pulse: 85 92  62  Resp: '13 16  14  '$ Temp:  97.9 F (36.6  C)    TempSrc:  Oral    SpO2: 97% 99%  95%  Weight:   73.7 kg   Height:        Intake/Output Summary (Last 24 hours) at 07/22/2022 0746 Last data filed at 07/22/2022 0455 Gross per 24 hour  Intake --  Output 3250 ml  Net -3250 ml   Filed Weights   07/16/22 2258 07/22/22 0450  Weight: 77.1 kg 73.7 kg     Physical Exam:  General: awake, alert, NAD HEENT: atraumatic, clear conjunctiva, anicteric sclera, MMM, hard of hearing Respiratory: normal respiratory effort. Cardiovascular: quick capillary refill Gastrointestinal: soft, NT, ND Nervous: A&O x3. no gross focal neurologic deficits, normal speech Extremities: moves all equally, no edema, normal tone Skin: dry, intact, normal temperature, normal color. No rashes, lesions or ulcers on exposed skin Psychiatry: normal mood, congruent affect  Labs   I have personally reviewed the following labs and imaging studies CBC    Component Value Date/Time   WBC 6.4 07/22/2022 0330   RBC 3.12 (L) 07/22/2022 0330   HGB 9.3 (L) 07/22/2022 0330   HCT 29.5 (L) 07/22/2022 0330   PLT 328 07/22/2022 0330   MCV 94.6 07/22/2022 0330   MCH 29.8 07/22/2022 0330   MCHC 31.5 07/22/2022 0330   RDW 18.0 (H) 07/22/2022 0330   LYMPHSABS 0.5 (L) 07/16/2022 2349   MONOABS 0.9 07/16/2022 2349   EOSABS 0.1 07/16/2022 2349   BASOSABS 0.0 07/16/2022 2349      Latest Ref Rng & Units 07/22/2022    3:30 AM 07/21/2022    2:16 AM 07/20/2022    4:23 AM  BMP  Glucose 70 - 99 mg/dL 98  95  84   BUN 8 - 23 mg/dL 46  68  81   Creatinine 0.44 - 1.00 mg/dL 1.90  2.56  3.24   Sodium 135 - 145 mmol/L 139  140  137   Potassium 3.5 - 5.1 mmol/L 4.7  5.0  5.0   Chloride 98 - 111 mmol/L 105  109  105   CO2 22 - 32 mmol/L '24  23  21   '$ Calcium 8.9 - 10.3 mg/dL 9.7  9.4  8.7    Results for orders placed or performed during the hospital encounter of 07/16/22  Urine Culture     Status: Abnormal   Collection Time: 07/16/22 11:25 PM   Specimen: In/Out Cath Urine   Result Value Ref Range Status   Specimen Description IN/OUT CATH URINE  Final   Special Requests   Final    NONE Performed at [facility], [address], [place], [place] [address]    Culture (A)  Final    >=100,000 COLONIES/mL HAFNIA ALVEI 60,000 COLONIES/mL ENTEROCOCCUS FAECIUM    Report Status 07/21/2022 FINAL  Final   Organism ID, Bacteria HAFNIA ALVEI (A)  Final   Organism ID, Bacteria ENTEROCOCCUS FAECIUM (A)  Final      Susceptibility   Enterococcus faecium -  MIC*    AMPICILLIN <=2 SENSITIVE Sensitive     NITROFURANTOIN 32 SENSITIVE Sensitive     VANCOMYCIN <=0.5 SENSITIVE Sensitive     * 60,000 COLONIES/mL ENTEROCOCCUS FAECIUM   Hafnia alvei - MIC*    AMPICILLIN >=32 RESISTANT Resistant     CEFAZOLIN >=64 RESISTANT Resistant     CEFTRIAXONE >=64 RESISTANT Resistant     CIPROFLOXACIN <=0.25 SENSITIVE Sensitive     GENTAMICIN <=1 SENSITIVE Sensitive     IMIPENEM <=0.25 SENSITIVE Sensitive     NITROFURANTOIN <=16 SENSITIVE Sensitive     TRIMETH/SULFA <=20 SENSITIVE Sensitive     AMPICILLIN/SULBACTAM >=32 RESISTANT Resistant     PIP/TAZO >=128 RESISTANT Resistant     * >=100,000 COLONIES/mL HAFNIA ALVEI  Blood Culture (routine x 2)     Status: None (Preliminary result)   Collection Time: 07/16/22 11:42 PM   Specimen: BLOOD  Result Value Ref Range Status   Specimen Description BLOOD LEFT ARM  Final   Special Requests   Final    BOTTLES DRAWN AEROBIC AND ANAEROBIC Blood Culture adequate volume   Culture  Setup Time   Final    ANAEROBIC BOTTLE ONLY GRAM NEGATIVE RODS CRITICAL VALUE NOTED.  VALUE IS CONSISTENT WITH PREVIOUSLY REPORTED AND CALLED VALUE.    Culture   Final    GRAM NEGATIVE RODS IDENTIFICATION TO FOLLOW Performed at [facility], [place] [address] [place], [place] [address]    Report Status PENDING  Incomplete  Blood Culture (routine x 2)     Status: Abnormal   Collection Time: 07/16/22 11:49 PM   Specimen: BLOOD  Result Value Ref Range  Status   Specimen Description BLOOD RIGHT ARM  Final   Special Requests   Final    BOTTLES DRAWN AEROBIC AND ANAEROBIC Blood Culture adequate volume   Culture  Setup Time   Final    GRAM NEGATIVE RODS ANAEROBIC BOTTLE ONLY CRITICAL RESULT CALLED TO, READ BACK BY AND VERIFIED WITH: PHARMD [place] ON 07/17/22 @ 1900 BY DRT Performed at [facility], [place] [address] [place], [place] [address]    Culture HAFNIA ALVEI (A)  Final   Report Status 07/19/2022 FINAL  Final   Organism ID, Bacteria HAFNIA ALVEI  Final      Susceptibility   Hafnia alvei - MIC*    AMPICILLIN >=32 RESISTANT Resistant     CEFAZOLIN >=64 RESISTANT Resistant     CEFTAZIDIME >=64 RESISTANT Resistant     CEFTRIAXONE >=64 RESISTANT Resistant     CIPROFLOXACIN <=0.25 SENSITIVE Sensitive     GENTAMICIN <=1 SENSITIVE Sensitive     IMIPENEM 0.5 SENSITIVE Sensitive     TRIMETH/SULFA <=20 SENSITIVE Sensitive     AMPICILLIN/SULBACTAM >=32 RESISTANT Resistant     PIP/TAZO >=128 RESISTANT Resistant     * HAFNIA ALVEI  Resp panel by RT-PCR (RSV, Flu A&B, Covid) Anterior Nasal Swab     Status: None   Collection Time: 07/16/22 11:49 PM   Specimen: Anterior Nasal Swab  Result Value Ref Range Status   SARS Coronavirus 2 by RT PCR NEGATIVE NEGATIVE Final    Comment: (NOTE) SARS-CoV-2 target nucleic acids are NOT DETECTED.  The SARS-CoV-2 RNA is generally detectable in upper respiratory specimens during the acute phase of infection. The lowest concentration of SARS-CoV-2 viral copies this assay can detect is 138 copies/mL. A negative result does not preclude SARS-Cov-2 infection and should not be used as the sole basis for treatment or other patient management  decisions. A negative result may occur with  improper specimen collection/handling, submission of specimen other than nasopharyngeal swab, presence of viral mutation(s) within the areas targeted by this assay, and inadequate number of viral copies(<138  copies/mL). A negative result must be combined with clinical observations, patient history, and epidemiological information. The expected result is Negative.  Fact Sheet for Patients:  EntrepreneurPulse.com.au  Fact Sheet for Healthcare Providers:  IncredibleEmployment.be  This test is no t yet approved or cleared by the [place] FDA and  has been authorized for detection and/or diagnosis of SARS-CoV-2 by FDA under an Emergency Use Authorization (EUA). This EUA will remain  in effect (meaning this test can be used) for the duration of the COVID-19 declaration under Section 564(b)(1) of the Act, 21 U.S.C.section 360bbb-3(b)(1), unless the authorization is terminated  or revoked sooner.       Influenza A by PCR NEGATIVE NEGATIVE Final   Influenza B by PCR NEGATIVE NEGATIVE Final    Comment: (NOTE) The Xpert Xpress SARS-CoV-2/FLU/RSV plus assay is intended as an aid in the diagnosis of influenza from Nasopharyngeal swab specimens and should not be used as a sole basis for treatment. Nasal washings and aspirates are unacceptable for Xpert Xpress SARS-CoV-2/FLU/RSV testing.  Fact Sheet for Patients: EntrepreneurPulse.com.au  Fact Sheet for Healthcare Providers: IncredibleEmployment.be  This test is not yet approved or cleared by the [place] FDA and has been authorized for detection and/or diagnosis of SARS-CoV-2 by FDA under an Emergency Use Authorization (EUA). This EUA will remain in effect (meaning this test can be used) for the duration of the COVID-19 declaration under Section 564(b)(1) of the Act, 21 U.S.C. section 360bbb-3(b)(1), unless the authorization is terminated or revoked.     Resp Syncytial Virus by PCR NEGATIVE NEGATIVE Final    Comment: (NOTE) Fact Sheet for Patients: EntrepreneurPulse.com.au  Fact Sheet for Healthcare  Providers: IncredibleEmployment.be  This test is not yet approved or cleared by the [place] FDA and has been authorized for detection and/or diagnosis of SARS-CoV-2 by FDA under an Emergency Use Authorization (EUA). This EUA will remain in effect (meaning this test can be used) for the duration of the COVID-19 declaration under Section 564(b)(1) of the Act, 21 U.S.C. section 360bbb-3(b)(1), unless the authorization is terminated or revoked.  Performed at [facility], [place] [address], [place], [place] [address]   Blood Culture ID Panel (Reflexed)     Status: Abnormal   Collection Time: 07/16/22 11:49 PM  Result Value Ref Range Status   Enterococcus faecalis NOT DETECTED NOT DETECTED Final   Enterococcus Faecium NOT DETECTED NOT DETECTED Final   Listeria monocytogenes NOT DETECTED NOT DETECTED Final   Staphylococcus species NOT DETECTED NOT DETECTED Final   Staphylococcus aureus (BCID) NOT DETECTED NOT DETECTED Final   Staphylococcus epidermidis NOT DETECTED NOT DETECTED Final   Staphylococcus lugdunensis NOT DETECTED NOT DETECTED Final   Streptococcus species NOT DETECTED NOT DETECTED Final   Streptococcus agalactiae NOT DETECTED NOT DETECTED Final   Streptococcus pneumoniae NOT DETECTED NOT DETECTED Final   Streptococcus pyogenes NOT DETECTED NOT DETECTED Final   A.calcoaceticus-baumannii NOT DETECTED NOT DETECTED Final   Bacteroides fragilis NOT DETECTED NOT DETECTED Final   Enterobacterales DETECTED (A) NOT DETECTED Final    Comment: Enterobacterales represent a large order of gram negative bacteria, not a single organism. Refer to culture for further identification. CRITICAL RESULT CALLED TO, READ BACK BY AND VERIFIED WITH: PHARMD LISA CURRAN ON 07/17/22 @ 1900 BY DRT  Enterobacter cloacae complex NOT DETECTED NOT DETECTED Final   Escherichia coli NOT DETECTED NOT DETECTED Final   Klebsiella aerogenes NOT DETECTED NOT DETECTED Final    Klebsiella oxytoca NOT DETECTED NOT DETECTED Final   Klebsiella pneumoniae NOT DETECTED NOT DETECTED Final   Proteus species NOT DETECTED NOT DETECTED Final   Salmonella species NOT DETECTED NOT DETECTED Final   Serratia marcescens NOT DETECTED NOT DETECTED Final   Haemophilus influenzae NOT DETECTED NOT DETECTED Final   Neisseria meningitidis NOT DETECTED NOT DETECTED Final   Pseudomonas aeruginosa NOT DETECTED NOT DETECTED Final   Stenotrophomonas maltophilia NOT DETECTED NOT DETECTED Final   Candida albicans NOT DETECTED NOT DETECTED Final   Candida auris NOT DETECTED NOT DETECTED Final   Candida glabrata NOT DETECTED NOT DETECTED Final   Candida krusei NOT DETECTED NOT DETECTED Final   Candida parapsilosis NOT DETECTED NOT DETECTED Final   Candida tropicalis NOT DETECTED NOT DETECTED Final   Cryptococcus neoformans/gattii NOT DETECTED NOT DETECTED Final   CTX-M ESBL NOT DETECTED NOT DETECTED Final   Carbapenem resistance IMP NOT DETECTED NOT DETECTED Final   Carbapenem resistance KPC NOT DETECTED NOT DETECTED Final   Carbapenem resistance NDM NOT DETECTED NOT DETECTED Final   Carbapenem resist OXA 48 LIKE NOT DETECTED NOT DETECTED Final   Carbapenem resistance VIM NOT DETECTED NOT DETECTED Final    Comment: Performed at [facility], [address], [place],  [address]   Disposition Plan & Communication  Patient status: Inpatient  Admitted From: SNF Planned disposition location: Relative's home Anticipated discharge date: 12/26 pending PT/OT evaluation and safe disposition.   Family Communication: daughter on phone. [facility] wishes to take mom home at time of discharge    Author: Richarda Osmond, DO [facility] 07/22/2022, 7:46 AM   Available by Epic secure chat 7AM-7PM. If 7PM-7AM, please contact night-coverage.  [facility] contact information found on [email or web address].

## 2022-07-22 NOTE — Progress Notes (Signed)
Pharmacy Antibiotic Note  [name] is a 88 y.o. female admitted on 07/16/2022 with bacteremia and UTI.  Pharmacy has been consulted for Ciprofloxacin dosing.Patient currently being treated with meropenem. SCr 1.9, CrCl ~18 ml/min. WBC 6.4 and afebrile. Antibiotics changed from IV to oral. Discussed with MD and okay to transition to oral antibiotics.   Plan: Start Ciprofloxacin 500 mg PO QD Stop ciprofloxacin IV Monitor renal function  Follow for length of therapy  Height: '5\' 1"'$  (154.9 cm) Weight: 73.7 kg (162 lb 7.7 oz) IBW/kg (Calculated) : 47.8  Temp (24hrs), Avg:98.2 F (36.8 C), Min:97.7 F (36.5 C), Max:99.2 F (37.3 C)  Recent Labs  Lab 07/16/22 2349 07/17/22 0517 07/18/22 0506 07/19/22 0620 07/20/22 0423 07/21/22 0216 07/22/22 0330  WBC 10.3   < > 7.2 6.2 6.0 5.7 6.4  CREATININE 5.93*   < > 5.00* 4.09* 3.24* 2.56* 1.90*  LATICACIDVEN 1.2  --   --   --   --   --   --    < > = values in this interval not displayed.    Estimated Creatinine Clearance: 17.7 mL/min (A) (by C-G formula based on SCr of 1.9 mg/dL (H)).    Allergies  Allergen Reactions   Ace Inhibitors Swelling, Rash and Cough   Prandin [Repaglinide] Other (See Comments)    Caused significant peripheral edema   Dilaudid [Hydromorphone Hcl] Itching and Other (See Comments)    Hallucinations (auditory and visual) also   Other Nausea And Vomiting    "Tussionex Pennkinetic ER"   Latex Rash   Tape Rash    Prefers PAPER TAPE, PLEASE!!    Antimicrobials this admission: Cefepime 12/19 >> 12/20 Meropenem 12/21 >> 12/23 Ciprofloxacin 12/24 >>  Dose adjustments this admission: Meropenem dose increased from 500 mg IV q24 hours to q12 hours on 12/23  Microbiology results: 12/19 BCx: GNR 1/4 - BCID enterobacterales species 12/18 BCx + Enterobacterales, Hafnia alvei  12/18 UCx: Enterococcus faecium, Hafnia alvei  Thank you for allowing pharmacy to be a part of this patient's care.  Jeneen Rinks 14/48/1856 31:49 PM

## 2022-07-22 NOTE — Progress Notes (Signed)
No IV meds due until 2045 this pm.  Since pt is pulling out PIV's, John RN to request for night shift  to start PIV just prior to med due.

## 2022-07-23 ENCOUNTER — Inpatient Hospital Stay (HOSPITAL_COMMUNITY): Payer: PPO

## 2022-07-23 DIAGNOSIS — N1330 Unspecified hydronephrosis: Secondary | ICD-10-CM

## 2022-07-23 DIAGNOSIS — R410 Disorientation, unspecified: Secondary | ICD-10-CM | POA: Diagnosis not present

## 2022-07-23 DIAGNOSIS — R339 Retention of urine, unspecified: Secondary | ICD-10-CM | POA: Diagnosis not present

## 2022-07-23 DIAGNOSIS — E860 Dehydration: Secondary | ICD-10-CM | POA: Diagnosis not present

## 2022-07-23 DIAGNOSIS — N179 Acute kidney failure, unspecified: Secondary | ICD-10-CM | POA: Diagnosis not present

## 2022-07-23 LAB — CBC
HCT: 27.7 % — ABNORMAL LOW (ref 36.0–46.0)
Hemoglobin: 8.8 g/dL — ABNORMAL LOW (ref 12.0–15.0)
MCH: 29.9 pg (ref 26.0–34.0)
MCHC: 31.8 g/dL (ref 30.0–36.0)
MCV: 94.2 fL (ref 80.0–100.0)
Platelets: 306 10*3/uL (ref 150–400)
RBC: 2.94 MIL/uL — ABNORMAL LOW (ref 3.87–5.11)
RDW: 17.6 % — ABNORMAL HIGH (ref 11.5–15.5)
WBC: 5.3 10*3/uL (ref 4.0–10.5)
nRBC: 0 % (ref 0.0–0.2)

## 2022-07-23 LAB — GLUCOSE, CAPILLARY
Glucose-Capillary: 112 mg/dL — ABNORMAL HIGH (ref 70–99)
Glucose-Capillary: 105 mg/dL — ABNORMAL HIGH (ref 70–99)
Glucose-Capillary: 124 mg/dL — ABNORMAL HIGH (ref 70–99)
Glucose-Capillary: 97 mg/dL (ref 70–99)

## 2022-07-23 LAB — CULTURE, BLOOD (ROUTINE X 2): Special Requests: ADEQUATE

## 2022-07-23 LAB — BASIC METABOLIC PANEL
Anion gap: 7 (ref 5–15)
BUN: 40 mg/dL — ABNORMAL HIGH (ref 8–23)
CO2: 27 mmol/L (ref 22–32)
Calcium: 9.1 mg/dL (ref 8.9–10.3)
Chloride: 102 mmol/L (ref 98–111)
Creatinine, Ser: 1.66 mg/dL — ABNORMAL HIGH (ref 0.44–1.00)
GFR, Estimated: 29 mL/min — ABNORMAL LOW (ref >60)
Glucose, Bld: 110 mg/dL — ABNORMAL HIGH (ref 70–99)
Potassium: 4.8 mmol/L (ref 3.5–5.1)
Sodium: 136 mmol/L (ref 135–145)

## 2022-07-23 NOTE — Plan of Care (Signed)
  Problem: Education: Goal: Verbalization of understanding the information provided (i.e., activity precautions, restrictions, etc) will improve Outcome: Progressing Goal: Individualized Educational Video(s) Outcome: Progressing   Problem: Activity: Goal: Ability to ambulate and perform ADLs will improve Outcome: Progressing   Problem: Clinical Measurements: Goal: Postoperative complications will be avoided or minimized Outcome: Progressing   Problem: Self-Concept: Goal: Ability to maintain and perform role responsibilities to the fullest extent possible will improve Outcome: Progressing   Problem: Pain Management: Goal: Pain level will decrease Outcome: Progressing   Problem: Education: Goal: Ability to describe self-care measures that may prevent or decrease complications (Diabetes Survival Skills Education) will improve Outcome: Progressing Goal: Individualized Educational Video(s) Outcome: Progressing   Problem: Coping: Goal: Ability to adjust to condition or change in health will improve Outcome: Progressing   Problem: Fluid Volume: Goal: Ability to maintain a balanced intake and output will improve Outcome: Progressing   Problem: Health Behavior/Discharge Planning: Goal: Ability to identify and utilize available resources and services will improve Outcome: Progressing Goal: Ability to manage health-related needs will improve Outcome: Progressing   Problem: Metabolic: Goal: Ability to maintain appropriate glucose levels will improve Outcome: Progressing   Problem: Nutritional: Goal: Maintenance of adequate nutrition will improve Outcome: Progressing Goal: Progress toward achieving an optimal weight will improve Outcome: Progressing   Problem: Skin Integrity: Goal: Risk for impaired skin integrity will decrease Outcome: Progressing   Problem: Tissue Perfusion: Goal: Adequacy of tissue perfusion will improve Outcome: Progressing   Problem: Fluid  Volume: Goal: Hemodynamic stability will improve Outcome: Progressing   Problem: Clinical Measurements: Goal: Diagnostic test results will improve Outcome: Progressing Goal: Signs and symptoms of infection will decrease Outcome: Progressing   Problem: Respiratory: Goal: Ability to maintain adequate ventilation will improve Outcome: Progressing   Problem: Education: Goal: Knowledge of General Education information will improve Description: Including pain rating scale, medication(s)/side effects and non-pharmacologic comfort measures Outcome: Progressing   Problem: Health Behavior/Discharge Planning: Goal: Ability to manage health-related needs will improve Outcome: Progressing   Problem: Clinical Measurements: Goal: Ability to maintain clinical measurements within normal limits will improve Outcome: Progressing Goal: Will remain free from infection Outcome: Progressing Goal: Diagnostic test results will improve Outcome: Progressing Goal: Respiratory complications will improve Outcome: Progressing Goal: Cardiovascular complication will be avoided Outcome: Progressing   Problem: Activity: Goal: Risk for activity intolerance will decrease Outcome: Progressing   Problem: Nutrition: Goal: Adequate nutrition will be maintained Outcome: Progressing   Problem: Coping: Goal: Level of anxiety will decrease Outcome: Progressing   Problem: Elimination: Goal: Will not experience complications related to bowel motility Outcome: Progressing Goal: Will not experience complications related to urinary retention Outcome: Progressing   Problem: Pain Managment: Goal: General experience of comfort will improve Outcome: Progressing   Problem: Safety: Goal: Ability to remain free from injury will improve Outcome: Progressing   Problem: Skin Integrity: Goal: Risk for impaired skin integrity will decrease Outcome: Progressing

## 2022-07-23 NOTE — Progress Notes (Signed)
PROGRESS NOTE  [name]    DOB: [birth year], 88 y.o.  WUJ:[id number]    Code Status: Full Code   DOA: 07/16/2022   LOS: 6   Brief hospital course  [name] is a 88 y.o. female with a PMH significant for dementia, type 2 diabetes mellitus, hypertension, chronic diastolic CHF, CKD stage IV, seizure, and right distal femur fracture status post surgical repair on 07/03/2022 .  They presented from SNF to the ED on 07/16/2022 with AMS x 1 days. She also complained of urinary retention x3 days. She had recently been treated for UTI with rocephin on prior hospitalization.   In the ED, it was found that they had a rectal temp of 37.8 C, saturation of 100% on 2 L/min of supplemental oxygen, normal heart rate, and systolic blood pressure of 103 and greater.  Significant findings included no acute findings on head CT. CT of the abdomen pelvis demonstrates marked distention of the urinary bladder with intraluminal air and possibly air within the bladder wall, mild bilateral hydronephrosis, and no obstructive calculi identified.  Potassium was 5.8, bicarbonate 17, BUN 92, and creatinine 5.93.  Lactic acid reassuringly normal. Blood cultures were collected, urine culture was ordered   They were initially treated with foley catheter, IVF, and IV rocephin.   Patient was admitted to medicine service for further workup and management of acute urinary retention as outlined in detail below.  07/23/22 -stable. Renal US today reveals that hydronephrosis has resolved. Patient will be stable to discharge home pending evaluation by PT/OT. Orders have been placed  Assessment & Plan  Principal Problem:   Acute renal failure superimposed on stage 4 chronic kidney disease (HCC) Active Problems:   Chronic diastolic CHF (congestive heart failure) (HCC)   Type 2 diabetes mellitus with complication, without long-term current use of insulin (HCC)   Hypertension   history of seizures   Dementia (HCC)   Acute  encephalopathy   Emphysematous cystitis   Hyperkalemia   Acute urinary retention   Complex renal cyst   Delirium   Dehydration   History of femur fracture   Essential hypertension   Urinary retention  Emphysematous cystitis- in setting of acute urinary retention. UxCx growing Hafnia alvei sensitive to cipro. Same with blood cultures. - discontinue oxybutynin  - change Abx to cipro. Patient unable to tolerate PIV so changed to PO. She is afebrile and alert/oriented and normal WBC count   AKI superimposed on CKD IV  hyperkalemia  metabolic acidosis  K+ WNL. Cr is returned close to baseline. Taking PO so fluids discontinued - continue foley catheter - repeat renal US to ensure resolution of hydronephrosis - monitor electrolytes and replete PRN   Acute encephalopathy- resolving. Patient close to baseline. No acute findings on head CT. Daughter states she is greatly improved today. - PT/OT evaluation - delirium precautions  Home meds include eliquis for DVT ppx following recent surgery. Held on admission. - on lovenox ppx   Type II DM- A1c was 6.4% in August 2023 which is well below goal for her age. Is not insulin dependent at baseline - stop sliding scale. Monitor blood glucose with labs and stat for suspected hyper/hypoglycemia   Normocytic anemia- hgb stable around 8.5-9.5. no signs of active bleeding - monitor periodically   Hypertension - Antihypertensives held on admission given concern for developing sepsis and SBP low 100s - restart home antihypertensives now developing elevated systolics   Dementia  - Delirium precautions  - Seroquel held  on admission d/t somnolence     Hx of seizure  - Continue Keppra     Complex renal cyst  - Mildly complex left renal cyst noted on CT in ED  - Outpatient follow-up recommended with [place] in 6 months  Body mass index is 30.78 kg/m.  VTE ppx: heparin injection 5,000 Units Start: 07/17/22 0600  Diet:     Diet   DIET DYS 2  Room service appropriate? No; Fluid consistency: Thin   Consultants: None   Subjective 07/23/22    Pt reports no complaints today. She feels well. Denies SOB.   Objective   Vitals:   07/23/22 0000 07/23/22 0030 07/23/22 0737 07/23/22 0752  BP: (!) 130/57  (!) 167/69   Pulse:   68   Resp: 19  16   Temp:  97.7 F (36.5 C) 97.7 F (36.5 C)   TempSrc:  Oral Axillary   SpO2:      Weight:    73.9 kg  Height:        Intake/Output Summary (Last 24 hours) at 07/23/2022 0800 Last data filed at 07/22/2022 1823 Gross per 24 hour  Intake 200 ml  Output --  Net 200 ml    Filed Weights   07/16/22 2258 07/22/22 0450 07/23/22 0752  Weight: 77.1 kg 73.7 kg 73.9 kg     Physical Exam:  General: awake, alert, NAD HEENT: atraumatic, clear conjunctiva, anicteric sclera, MMM, hard of hearing Respiratory: normal respiratory effort. Cardiovascular: quick capillary refill Gastrointestinal: soft, NT, ND Nervous: A&O x3. no gross focal neurologic deficits, normal speech Extremities: moves all equally, no edema, normal tone Skin: dry, intact, normal temperature, normal color. No rashes, lesions or ulcers on exposed skin Psychiatry: normal mood, congruent affect  Labs   I have personally reviewed the following labs and imaging studies CBC    Component Value Date/Time   WBC 5.3 07/23/2022 0239   RBC 2.94 (L) 07/23/2022 0239   HGB 8.8 (L) 07/23/2022 0239   HCT 27.7 (L) 07/23/2022 0239   PLT 306 07/23/2022 0239   MCV 94.2 07/23/2022 0239   MCH 29.9 07/23/2022 0239   MCHC 31.8 07/23/2022 0239   RDW 17.6 (H) 07/23/2022 0239   LYMPHSABS 0.5 (L) 07/16/2022 2349   MONOABS 0.9 07/16/2022 2349   EOSABS 0.1 07/16/2022 2349   BASOSABS 0.0 07/16/2022 2349      Latest Ref Rng & Units 07/23/2022    2:39 AM 07/22/2022    3:30 AM 07/21/2022    2:16 AM  BMP  Glucose 70 - 99 mg/dL 110  98  95   BUN 8 - 23 mg/dL 40  46  68   Creatinine 0.44 - 1.00 mg/dL 1.66  1.90  2.56   Sodium 135 - 145  mmol/L 136  139  140   Potassium 3.5 - 5.1 mmol/L 4.8  4.7  5.0   Chloride 98 - 111 mmol/L 102  105  109   CO2 22 - 32 mmol/L '27  24  23   '$ Calcium 8.9 - 10.3 mg/dL 9.1  9.7  9.4    Results for orders placed or performed during the hospital encounter of 07/16/22  Urine Culture     Status: Abnormal   Collection Time: 07/16/22 11:25 PM   Specimen: In/Out Cath Urine  Result Value Ref Range Status   Specimen Description IN/OUT CATH URINE  Final   Special Requests   Final    NONE Performed at [facility], [address] [place], [place] [address]    Culture (A)  Final    >=100,000 COLONIES/mL HAFNIA ALVEI 60,000 COLONIES/mL ENTEROCOCCUS FAECIUM    Report Status 07/21/2022 FINAL  Final   Organism ID, Bacteria HAFNIA ALVEI (A)  Final   Organism ID, Bacteria ENTEROCOCCUS FAECIUM (A)  Final      Susceptibility   Enterococcus faecium - MIC*    AMPICILLIN <=2 SENSITIVE Sensitive     NITROFURANTOIN 32 SENSITIVE Sensitive     VANCOMYCIN <=0.5 SENSITIVE Sensitive     * 60,000 COLONIES/mL ENTEROCOCCUS FAECIUM   Hafnia alvei - MIC*    AMPICILLIN >=32 RESISTANT Resistant     CEFAZOLIN >=64 RESISTANT Resistant     CEFTRIAXONE >=64 RESISTANT Resistant     CIPROFLOXACIN <=0.25 SENSITIVE Sensitive     GENTAMICIN <=1 SENSITIVE Sensitive     IMIPENEM <=0.25 SENSITIVE Sensitive     NITROFURANTOIN <=16 SENSITIVE Sensitive     TRIMETH/SULFA <=20 SENSITIVE Sensitive     AMPICILLIN/SULBACTAM >=32 RESISTANT Resistant     PIP/TAZO >=128 RESISTANT Resistant     * >=100,000 COLONIES/mL HAFNIA ALVEI  Blood Culture (routine x 2)     Status: None (Preliminary result)   Collection Time: 07/16/22 11:42 PM   Specimen: BLOOD  Result Value Ref Range Status   Specimen Description BLOOD LEFT ARM  Final   Special Requests   Final    BOTTLES DRAWN AEROBIC AND ANAEROBIC Blood Culture adequate volume   Culture  Setup Time   Final    ANAEROBIC BOTTLE ONLY GRAM NEGATIVE RODS CRITICAL VALUE NOTED.  VALUE IS  CONSISTENT WITH PREVIOUSLY REPORTED AND CALLED VALUE.    Culture   Final    GRAM NEGATIVE RODS IDENTIFICATION TO FOLLOW Performed at [facility], [place] [address], [place], [place] [address]    Report Status PENDING  Incomplete  Blood Culture (routine x 2)     Status: Abnormal   Collection Time: 07/16/22 11:49 PM   Specimen: BLOOD  Result Value Ref Range Status   Specimen Description BLOOD RIGHT ARM  Final   Special Requests   Final    BOTTLES DRAWN AEROBIC AND ANAEROBIC Blood Culture adequate volume   Culture  Setup Time   Final    GRAM NEGATIVE RODS ANAEROBIC BOTTLE ONLY CRITICAL RESULT CALLED TO, READ BACK BY AND VERIFIED WITH: PHARMD [place] ON 07/17/22 @ 1900 BY DRT Performed at [facility], [place] [address] [place], [place] [address]    Culture HAFNIA ALVEI (A)  Final   Report Status 07/19/2022 FINAL  Final   Organism ID, Bacteria HAFNIA ALVEI  Final      Susceptibility   Hafnia alvei - MIC*    AMPICILLIN >=32 RESISTANT Resistant     CEFAZOLIN >=64 RESISTANT Resistant     CEFTAZIDIME >=64 RESISTANT Resistant     CEFTRIAXONE >=64 RESISTANT Resistant     CIPROFLOXACIN <=0.25 SENSITIVE Sensitive     GENTAMICIN <=1 SENSITIVE Sensitive     IMIPENEM 0.5 SENSITIVE Sensitive     TRIMETH/SULFA <=20 SENSITIVE Sensitive     AMPICILLIN/SULBACTAM >=32 RESISTANT Resistant     PIP/TAZO >=128 RESISTANT Resistant     * HAFNIA ALVEI  Resp panel by RT-PCR (RSV, Flu A&B, Covid) Anterior Nasal Swab     Status: None   Collection Time: 07/16/22 11:49 PM   Specimen: Anterior Nasal Swab  Result Value Ref Range Status   SARS Coronavirus 2 by RT PCR NEGATIVE NEGATIVE Final  Comment: (NOTE) SARS-CoV-2 target nucleic acids are NOT DETECTED.  The SARS-CoV-2 RNA is generally detectable in upper respiratory specimens during the acute phase of infection. The lowest concentration of SARS-CoV-2 viral copies this assay can detect is 138 copies/mL. A negative result does not  preclude SARS-Cov-2 infection and should not be used as the sole basis for treatment or other patient management decisions. A negative result may occur with  improper specimen collection/handling, submission of specimen other than nasopharyngeal swab, presence of viral mutation(s) within the areas targeted by this assay, and inadequate number of viral copies(<138 copies/mL). A negative result must be combined with clinical observations, patient history, and epidemiological information. The expected result is Negative.  Fact Sheet for Patients:  EntrepreneurPulse.com.au  Fact Sheet for Healthcare Providers:  IncredibleEmployment.be  This test is no t yet approved or cleared by the [place] FDA and  has been authorized for detection and/or diagnosis of SARS-CoV-2 by FDA under an Emergency Use Authorization (EUA). This EUA will remain  in effect (meaning this test can be used) for the duration of the COVID-19 declaration under Section 564(b)(1) of the Act, 21 U.S.C.section 360bbb-3(b)(1), unless the authorization is terminated  or revoked sooner.       Influenza A by PCR NEGATIVE NEGATIVE Final   Influenza B by PCR NEGATIVE NEGATIVE Final    Comment: (NOTE) The Xpert Xpress SARS-CoV-2/FLU/RSV plus assay is intended as an aid in the diagnosis of influenza from Nasopharyngeal swab specimens and should not be used as a sole basis for treatment. Nasal washings and aspirates are unacceptable for Xpert Xpress SARS-CoV-2/FLU/RSV testing.  Fact Sheet for Patients: EntrepreneurPulse.com.au  Fact Sheet for Healthcare Providers: IncredibleEmployment.be  This test is not yet approved or cleared by the [place] FDA and has been authorized for detection and/or diagnosis of SARS-CoV-2 by FDA under an Emergency Use Authorization (EUA). This EUA will remain in effect (meaning this test can be used) for the  duration of the COVID-19 declaration under Section 564(b)(1) of the Act, 21 U.S.C. section 360bbb-3(b)(1), unless the authorization is terminated or revoked.     Resp Syncytial Virus by PCR NEGATIVE NEGATIVE Final    Comment: (NOTE) Fact Sheet for Patients: EntrepreneurPulse.com.au  Fact Sheet for Healthcare Providers: IncredibleEmployment.be  This test is not yet approved or cleared by the [place] FDA and has been authorized for detection and/or diagnosis of SARS-CoV-2 by FDA under an Emergency Use Authorization (EUA). This EUA will remain in effect (meaning this test can be used) for the duration of the COVID-19 declaration under Section 564(b)(1) of the Act, 21 U.S.C. section 360bbb-3(b)(1), unless the authorization is terminated or revoked.  Performed at [facility], [place] [address], [place], [place] [address]   Blood Culture ID Panel (Reflexed)     Status: Abnormal   Collection Time: 07/16/22 11:49 PM  Result Value Ref Range Status   Enterococcus faecalis NOT DETECTED NOT DETECTED Final   Enterococcus Faecium NOT DETECTED NOT DETECTED Final   Listeria monocytogenes NOT DETECTED NOT DETECTED Final   Staphylococcus species NOT DETECTED NOT DETECTED Final   Staphylococcus aureus (BCID) NOT DETECTED NOT DETECTED Final   Staphylococcus epidermidis NOT DETECTED NOT DETECTED Final   Staphylococcus lugdunensis NOT DETECTED NOT DETECTED Final   Streptococcus species NOT DETECTED NOT DETECTED Final   Streptococcus agalactiae NOT DETECTED NOT DETECTED Final   Streptococcus pneumoniae NOT DETECTED NOT DETECTED Final   Streptococcus pyogenes NOT DETECTED NOT DETECTED Final   A.calcoaceticus-baumannii NOT DETECTED NOT  DETECTED Final   Bacteroides fragilis NOT DETECTED NOT DETECTED Final   Enterobacterales DETECTED (A) NOT DETECTED Final    Comment: Enterobacterales represent a large order of gram negative bacteria, not a single organism.  Refer to culture for further identification. CRITICAL RESULT CALLED TO, READ BACK BY AND VERIFIED WITH: PHARMD LISA CURRAN ON 07/17/22 @ 1900 BY DRT    Enterobacter cloacae complex NOT DETECTED NOT DETECTED Final   Escherichia coli NOT DETECTED NOT DETECTED Final   Klebsiella aerogenes NOT DETECTED NOT DETECTED Final   Klebsiella oxytoca NOT DETECTED NOT DETECTED Final   Klebsiella pneumoniae NOT DETECTED NOT DETECTED Final   Proteus species NOT DETECTED NOT DETECTED Final   Salmonella species NOT DETECTED NOT DETECTED Final   Serratia marcescens NOT DETECTED NOT DETECTED Final   Haemophilus influenzae NOT DETECTED NOT DETECTED Final   Neisseria meningitidis NOT DETECTED NOT DETECTED Final   Pseudomonas aeruginosa NOT DETECTED NOT DETECTED Final   Stenotrophomonas maltophilia NOT DETECTED NOT DETECTED Final   Candida albicans NOT DETECTED NOT DETECTED Final   Candida auris NOT DETECTED NOT DETECTED Final   Candida glabrata NOT DETECTED NOT DETECTED Final   Candida krusei NOT DETECTED NOT DETECTED Final   Candida parapsilosis NOT DETECTED NOT DETECTED Final   Candida tropicalis NOT DETECTED NOT DETECTED Final   Cryptococcus neoformans/gattii NOT DETECTED NOT DETECTED Final   CTX-M ESBL NOT DETECTED NOT DETECTED Final   Carbapenem resistance IMP NOT DETECTED NOT DETECTED Final   Carbapenem resistance KPC NOT DETECTED NOT DETECTED Final   Carbapenem resistance NDM NOT DETECTED NOT DETECTED Final   Carbapenem resist OXA 48 LIKE NOT DETECTED NOT DETECTED Final   Carbapenem resistance VIM NOT DETECTED NOT DETECTED Final    Comment: Performed at [facility], [address] [place], [place] [address]   Disposition Plan & Communication  Patient status: Inpatient  Admitted From: SNF Planned disposition location: Relative's home Anticipated discharge date: 12/26 pending PT/OT evaluation and safe disposition.   Family Communication: daughter on phone. Expresses wishes to take mom  home at time of discharge    Author: Richarda Osmond, DO [facility] 07/23/2022, 8:00 AM   Available by Epic secure chat 7AM-7PM. If 7PM-7AM, please contact night-coverage.  [facility] contact information found on [email or web address].

## 2022-07-24 DIAGNOSIS — L600 Ingrowing nail: Secondary | ICD-10-CM | POA: Diagnosis not present

## 2022-07-24 DIAGNOSIS — E860 Dehydration: Secondary | ICD-10-CM | POA: Diagnosis not present

## 2022-07-24 DIAGNOSIS — N179 Acute kidney failure, unspecified: Secondary | ICD-10-CM | POA: Diagnosis not present

## 2022-07-24 DIAGNOSIS — R410 Disorientation, unspecified: Secondary | ICD-10-CM | POA: Diagnosis not present

## 2022-07-24 DIAGNOSIS — R339 Retention of urine, unspecified: Secondary | ICD-10-CM | POA: Diagnosis not present

## 2022-07-24 LAB — BASIC METABOLIC PANEL
Anion gap: 12 (ref 5–15)
BUN: 30 mg/dL — ABNORMAL HIGH (ref 8–23)
CO2: 25 mmol/L (ref 22–32)
Calcium: 9.3 mg/dL (ref 8.9–10.3)
Chloride: 100 mmol/L (ref 98–111)
Creatinine, Ser: 1.54 mg/dL — ABNORMAL HIGH (ref 0.44–1.00)
GFR, Estimated: 32 mL/min — ABNORMAL LOW (ref >60)
Glucose, Bld: 108 mg/dL — ABNORMAL HIGH (ref 70–99)
Potassium: 4.2 mmol/L (ref 3.5–5.1)
Sodium: 137 mmol/L (ref 135–145)

## 2022-07-24 LAB — CBC
HCT: 31.1 % — ABNORMAL LOW (ref 36.0–46.0)
Hemoglobin: 10.2 g/dL — ABNORMAL LOW (ref 12.0–15.0)
MCH: 30.4 pg (ref 26.0–34.0)
MCHC: 32.8 g/dL (ref 30.0–36.0)
MCV: 92.8 fL (ref 80.0–100.0)
Platelets: 332 10*3/uL (ref 150–400)
RBC: 3.35 MIL/uL — ABNORMAL LOW (ref 3.87–5.11)
RDW: 17.6 % — ABNORMAL HIGH (ref 11.5–15.5)
WBC: 5.1 10*3/uL (ref 4.0–10.5)
nRBC: 0 % (ref 0.0–0.2)

## 2022-07-24 LAB — GLUCOSE, CAPILLARY
Glucose-Capillary: 136 mg/dL — ABNORMAL HIGH (ref 70–99)
Glucose-Capillary: 103 mg/dL — ABNORMAL HIGH (ref 70–99)
Glucose-Capillary: 108 mg/dL — ABNORMAL HIGH (ref 70–99)
Glucose-Capillary: 95 mg/dL (ref 70–99)

## 2022-07-24 NOTE — Consult Note (Signed)
PODIATRY CONSULTATION  NAME [name] MRN [id number] DOB 1929-05-26 DOA 07/16/2022   Reason for consult: Painful toenail left hallux Chief Complaint  Patient presents with   Altered Mental Status    Consulting physician: Doristine Mango MD  History of present illness: 88 y.o. female PMHx dementia, type 2 diabetes mellitus, hypertension, chronic diastolic CHF, CKD stage IV, seizure, and right distal femur fracture status post surgical repair on 07/03/2022, admitted 07/17/2022 for lethargy and confusion. Today the daughter mentioned pain coming from her mother's left great toenail and is concerned for possible ingrown toenail. Podiatry consulted.   Past Medical History:  Diagnosis Date   Dementia ([place])    Diabetes mellitus    GERD (gastroesophageal reflux disease)    Glaucoma    Heart disease    Hypertension    Tinnitus        Latest Ref Rng & Units 07/24/2022    6:44 AM 07/23/2022    2:39 AM 07/22/2022    3:30 AM  CBC  WBC 4.0 - 10.5 K/uL 5.1  5.3  6.4   Hemoglobin 12.0 - 15.0 g/dL 10.2  8.8  9.3   Hematocrit 36.0 - 46.0 % 31.1  27.7  29.5   Platelets 150 - 400 K/uL 332  306  328        Latest Ref Rng & Units 07/24/2022    6:44 AM 07/23/2022    2:39 AM 07/22/2022    3:30 AM  BMP  Glucose 70 - 99 mg/dL 108  110  98   BUN 8 - 23 mg/dL 30  40  46   Creatinine 0.44 - 1.00 mg/dL 1.54  1.66  1.90   Sodium 135 - 145 mmol/L 137  136  139   Potassium 3.5 - 5.1 mmol/L 4.2  4.8  4.7   Chloride 98 - 111 mmol/L 100  102  105   CO2 22 - 32 mmol/L '25  27  24   '$ Calcium 8.9 - 10.3 mg/dL 9.3  9.1  9.7       Physical Exam: General: The patient is alert and oriented x3 in no acute distress.   Dermatology: Skin is warm, dry and supple bilateral lower extremities. Longitudinal split in the left hallux nail plate noted without any evidence or indication of ingrowing nail or paronychia. Very dry and stable.  Vascular: No edema or erythema noted. Capillary refill within normal  limits.  Neurological: Grossly intact via light tough   Musculoskeletal Exam: No structural deformity noted. Associated tenderness of the left hallux nail plate noted.    ASSESSMENT/PLAN OF CARE Longitudinal splitting of nail left hallux nail plate x 3 weeks - appears very dry and stable.  - no acute concerns.  - recommend f/u in office outpatient for further treatment and likely debridement of the nail plate. - will sign off. F/u in office 1 week postdischarge     Thank you for the consult.  Please contact me directly via secure chat with any questions or concerns.     Edrick Kins, DPM [facility]  Dr. Edrick Kins, DPM    [address], [place] [address]  Office [phone]  Fax [phone]

## 2022-07-24 NOTE — Progress Notes (Signed)
   Durable Medical Equipment (From admission, onward)        Start     Ordered  07/24/22 1317  For home use only DME Other see comment  Once      Comments: Harrel Lemon lift Question:  Length of Need  Answer:  Lifetime  07/24/22 1317  07/24/22 1313  For home use only DME lightweight manual wheelchair with seat cushion  Once      Comments: Patient suffers from right distal femur fracture, Dementia which impairs their ability to perform daily activities like bathing, dressing, feeding, grooming, and toileting in the home.  A walker will not resolve  issue with performing activities of daily living. A wheelchair will allow patient to safely perform daily activities. Patient is not able to propel themselves in the home using a standard weight wheelchair due to arm weakness, endurance, and general weakness.  Length of need Lifetime. Accessories: elevating leg rests (ELRs), wheel locks, extensions and anti-tippers.  07/24/22 1317  07/24/22 1312  For home use only DME Hospital bed  Once      Question Answer Comment Length of Need Lifetime  Patient has (list medical condition): dementia, type 2 diabetes mellitus, hypertension, chronic diastolic CHF, CKD stage IV, seizure, and right distal femur fracture status post surgical repair on 07/03/2022 .  The above medical condition requires: Patient requires the ability to reposition frequently  Head must be elevated greater than: 30 degrees  Bed type Semi-electric  Hoyer Lift Yes  Support Surface: Gel Overlay    07/24/22 1317

## 2022-07-24 NOTE — Evaluation (Signed)
Occupational Therapy Evaluation Patient Details Name: [name] MRN: [id number] DOB: [birth year] Today's Date: 07/24/2022   History of Present Illness 88 y.o. female admitted 12/18 with Emphysematous cystitis, AKI superimposed on CKD IV; hyperkalemia; metabolic acidosis,   Acute encephalopathy. PMHx: dementia, type 2 diabetes mellitus, hypertension, chronic diastolic CHF, CKD stage IV, seizure, and right distal femur fracture status post surgical repair on 07/03/2022.   Clinical Impression   Attempted to call daughter [name]), however no answer. Pt apparently admitted form SNF where she was undergoing rehab s/p R hip fx.  Currently requires Max A for rolling and all attempts at bed mobility and total A with LB ADL @ bed level. Pt with posterior bias once toward EOB. Increased discomfort with any mobility of RLE. Per chart review, daughter plans to take pt home. Pt will need 24/7 physical care, [place], [place] and DME listed below, including non-emergent ambulance transport home. Acute OT will follow.      Recommendations for follow up therapy are one component of a multi-disciplinary discharge planning process, led by the attending physician.  Recommendations may be updated based on patient status, additional functional criteria and insurance authorization.   Follow Up Recommendations  Home health OT (daughter wanting to take pt home per CM note)     Assistance Recommended at Discharge Frequent or constant Supervision/Assistance  Patient can return home with the following Two people to help with walking and/or transfers;Two people to help with bathing/dressing/bathroom;Direct supervision/assist for medications management;Direct supervision/assist for financial management;Assist for transportation;Help with stairs or ramp for entrance    Functional Status Assessment  Patient has had a recent decline in their functional status and demonstrates the ability to make significant improvements in  function in a reasonable and predictable amount of time.  Equipment Recommendations  Hospital bed;Wheelchair (measurements OT);Wheelchair cushion (measurements OT);Other (comment) (hoyer)    Recommendations for Other Services       Precautions / Restrictions Precautions Precautions: Fall Restrictions RLE Weight Bearing: Non weight bearing Other Position/Activity Restrictions: NWB x 6 wks beginning 12/5      Mobility Bed Mobility Overal bed mobility: Needs Assistance   Rolling: Max assist         General bed mobility comments: pt with apparent discomfort with any attempts at mobility    Transfers                   General transfer comment: will need Maximove      Balance     Sitting balance-Leahy Scale: Poor Sitting balance - Comments: posterior bias                                   ADL either performed or assessed with clinical judgement   ADL Overall ADL's : Needs assistance/impaired Eating/Feeding: Set up   Grooming: Set up   Upper Body Bathing: Set up;Supervision/ safety   Lower Body Bathing: Total assistance   Upper Body Dressing : Moderate assistance   Lower Body Dressing: Total assistance   Toilet Transfer:  (unable to attempt)           Functional mobility during ADLs:  (Max A with bed mobility form chair position)       Vision         Perception     Praxis      Pertinent Vitals/Pain Pain Assessment Pain Assessment: Faces Faces Pain Scale: Hurts whole lot Pain Location: RLE  with any attempts of movement Pain Descriptors / Indicators: Discomfort, Grimacing, Guarding, Moaning, Crying Pain Intervention(s): Limited activity within patient's tolerance     Hand Dominance Right   Extremity/Trunk Assessment Upper Extremity Assessment Upper Extremity Assessment: Generalized weakness   Lower Extremity Assessment Lower Extremity Assessment: Defer to PT evaluation   Cervical / Trunk Assessment Cervical /  Trunk Assessment:  (posterior bias)   Communication     Cognition Arousal/Alertness: Awake/alert Behavior During Therapy: Anxious Overall Cognitive Status: No family/caregiver present to determine baseline cognitive functioning                                 General Comments: aware she is in the hospital however not oriented to place; aware she had broken her hip     General Comments       Exercises Other Exercises Other Exercises: B ankle pumps x 10 Other Exercises: heel slides x 10 LLE   Shoulder Instructions      Home Living Family/patient expects to be discharged to:: Private residence Living Arrangements: Children Available Help at Discharge: Family Type of Home: House Home Access: Stairs to enter Technical brewer of Steps: 5 Entrance Stairs-Rails: Left Home Layout: Two level;Able to live on main level with bedroom/bathroom     Bathroom Shower/Tub: Occupational psychologist: Standard Bathroom Accessibility: Yes How Accessible: Accessible via walker Home Equipment: Rollator (4 wheels);Cane - single point;Shower seat;BSC/3in1   Additional Comments: Information obtained from recent notes.  Lives With: Family    Prior Functioning/Environment Prior Level of Function : Needs assist;History of Falls (last six months)  Cognitive Assist : ADLs (cognitive)   ADLs (Cognitive): Intermittent cues Physical Assist : ADLs (physical)   ADLs (physical): Bathing Mobility Comments: Daughter needs to encourage use of Rollator (in unfamiliar environments) or cane (when wearing heels). ADLs Comments: [name] assistance with bathing. Able to dress herself. (Information submitted prior to this recent admission provided above.) Since last d/c pt was working with SNF with NWB RLE status.)        OT Problem List: Decreased strength;Decreased range of motion;Decreased activity tolerance;Impaired balance (sitting and/or standing);Decreased  cognition;Decreased safety awareness;Decreased knowledge of use of DME or AE;Decreased knowledge of precautions;Pain      OT Treatment/Interventions: Self-care/ADL training;Therapeutic exercise;DME and/or AE instruction;Therapeutic activities;Cognitive remediation/compensation;Patient/family education;Balance training    OT Goals(Current goals can be found in the care plan section) Acute Rehab OT Goals Patient Stated Goal: pt wantingto walk to the bathroom OT Goal Formulation: Patient unable to participate in goal setting Time For Goal Achievement: 08/07/22 Potential to Achieve Goals: Fair  OT Frequency: Min 2X/week    Co-evaluation              AM-PAC OT "6 Clicks" Daily Activity     Outcome Measure Help from another person eating meals?: A Little Help from another person taking care of personal grooming?: A Little Help from another person toileting, which includes using toliet, bedpan, or urinal?: Total Help from another person bathing (including washing, rinsing, drying)?: A Lot Help from another person to put on and taking off regular upper body clothing?: A Lot Help from another person to put on and taking off regular lower body clothing?: Total 6 Click Score: 12   End of Session Nurse Communication: Need for lift equipment;Other (comment) (DC recommendations)  Activity Tolerance: Patient limited by pain Patient left: in bed;with call bell/phone within reach;with bed alarm set;Other (comment) (  chair position)  OT Visit Diagnosis: Other abnormalities of gait and mobility (R26.89);Unsteadiness on feet (R26.81);Muscle weakness (generalized) (M62.81);History of falling (Z91.81);Pain;Other symptoms and signs involving cognitive function Pain - Right/Left: Right Pain - part of body: Hip                Time: 1250-1312 OT Time Calculation (min): 22 min Charges:  OT General Charges $OT Visit: 1 Visit OT Evaluation $OT Eval Moderate Complexity: [place], OT/L    Acute OT Clinical Specialist [place] Pager [phone] Office [phone]   [facility] 07/24/2022, 1:35 PM

## 2022-07-24 NOTE — Progress Notes (Signed)
Physical Therapy Treatment Patient Details Name: [name] MRN: [id number] DOB: 20-Jun-1929 Today's Date: 07/24/2022   History of Present Illness 88 y.o. female admitted 12/18 with Emphysematous cystitis, AKI superimposed on CKD IV; hyperkalemia; metabolic acidosis,   Acute encephalopathy. PMHx: dementia, type 2 diabetes mellitus, hypertension, chronic diastolic CHF, CKD stage IV, seizure, and right distal femur fracture status post surgical repair on 07/03/2022.    PT Comments    Pt overall continues to require max assist for bed-level mobility only, pt tearful and exclaiming in pain when EOB and declines OOB transfer given RLE pain. Per chart review, pt plans to d/c home with assist of her daughter. Pt will require equipment listed below if this plan remains, and PT recommending maximizing HH services. PT to continue to follow.      Recommendations for follow up therapy are one component of a multi-disciplinary discharge planning process, led by the attending physician.  Recommendations may be updated based on patient status, additional functional criteria and insurance authorization.  Follow Up Recommendations  Other (comment) (family declines SNF, therefore home with HHPT, aide) Can patient physically be transported by private vehicle: No   Assistance Recommended at Discharge Frequent or constant Supervision/Assistance  Patient can return home with the following Two people to help with walking and/or transfers;Two people to help with bathing/dressing/bathroom;Assistance with cooking/housework;Assistance with feeding;Direct supervision/assist for financial management;Direct supervision/assist for medications management;Assist for transportation;Help with stairs or ramp for entrance   Equipment Recommendations  Wheelchair cushion (measurements PT);Wheelchair (measurements PT);Hospital bed;Other (comment) (hoyer lift)    Recommendations for Other Services       Precautions /  Restrictions Precautions Precautions: Fall Restrictions RLE Weight Bearing: Non weight bearing Other Position/Activity Restrictions: NWB x 6 wks beginning 12/5     Mobility  Bed Mobility Overal bed mobility: Needs Assistance Bed Mobility: Supine to Sit, Sit to Supine     Supine to sit: Max assist Sit to supine: Max assist   General bed mobility comments: assist for trunk and LE management, step-wise transitioning of LEs over EOB for pt comfort but pt with very poor tolerance secondary to RLE pain    Transfers                   General transfer comment: pt resistant, declines    Ambulation/Gait                   Stairs             Wheelchair Mobility    Modified Rankin (Stroke Patients Only)       Balance Overall balance assessment: Needs assistance Sitting-balance support: No upper extremity supported, Feet supported Sitting balance-Leahy Scale: Poor Sitting balance - Comments: posterior bias Postural control: Posterior lean                                  Cognition Arousal/Alertness: Awake/alert Behavior During Therapy: Anxious Overall Cognitive Status: No family/caregiver present to determine baseline cognitive functioning                                 General Comments: history of dementia; oriented to self, location, and situation. Very anxious especially with movement of RLE        Exercises      General Comments        Pertinent Vitals/Pain Pain Assessment  Pain Assessment: Faces Faces Pain Scale: Hurts even more Pain Location: RLE with any attempts of movement Pain Descriptors / Indicators: Crying, Moaning, Guarding, Grimacing Pain Intervention(s): Limited activity within patient's tolerance, Monitored during session, Repositioned    Home Living                          Prior Function            PT Goals (current goals can now be found in the care plan section) Acute Rehab  PT Goals Patient Stated Goal: none stated PT Goal Formulation: Patient unable to participate in goal setting Time For Goal Achievement: 08/03/22 Potential to Achieve Goals: Fair Progress towards PT goals: Not progressing toward goals - comment (resistant to mobility)    Frequency    Min 2X/week      PT Plan Other (comment) (plan remains appropriate, however per chart review pt's daugther wants to take pt home, recommending maximizing HH services)    Co-evaluation              AM-PAC PT "6 Clicks" Mobility   Outcome Measure  Help needed turning from your back to your side while in a flat bed without using bedrails?: Total Help needed moving from lying on your back to sitting on the side of a flat bed without using bedrails?: Total Help needed moving to and from a bed to a chair (including a wheelchair)?: Total Help needed standing up from a chair using your arms (e.g., wheelchair or bedside chair)?: Total Help needed to walk in hospital room?: Total Help needed climbing 3-5 steps with a railing? : Total 6 Click Score: 6    End of Session   Activity Tolerance: Patient limited by pain Patient left: in bed;with bed alarm set;with call bell/phone within reach;Other (comment) (pt requesting all rails up) Nurse Communication: Mobility status PT Visit Diagnosis: Muscle weakness (generalized) (M62.81);Pain;Other abnormalities of gait and mobility (R26.89);Difficulty in walking, not elsewhere classified (R26.2) Pain - Right/Left: Right Pain - part of body: Leg     Time: 1308-6578 PT Time Calculation (min) (ACUTE ONLY): 14 min  Charges:  $Therapeutic Activity: 8-22 mins                     Stacie Glaze, PT DPT Acute Rehabilitation Services Pager [phone]  Office [phone]    Roxine Caddy E Ruffin Pyo 07/24/2022, 5:06 PM

## 2022-07-24 NOTE — TOC Transition Note (Signed)
Transition of Care [facility]) - CM/SW Discharge Note   Patient Details  Name: [name] MRN: [id number] Date of Birth: 02-13-1929  [facility] CM/SW Contact:  Cyndi Bender, RN Phone Number: 07/24/2022, 1:30 PM   Clinical Narrative:    Damaris Schooner to Parke Simmers, daughter, regarding transition needs. Parke Simmers would like to take patient home. DME has been ordered through [employer]. Parke Simmers is agreeable to use [facility] for [facility]. Patient has used [facility] in the past.  Patient will need [facility] for transportation when discharged.  TOC will continue to follow for needs.   Address, Phone number and PCP verified.   Barriers to Discharge: Continued Medical Work up   Patient Goals and CMS Choice CMS Medicare.gov Compare Post Acute Care list provided to:: Patient Represenative (must comment) Choice offered to / list presented to : Adult Children  Discharge Placement                         Discharge Plan and Services Additional resources added to the After Visit Summary for     Discharge Planning Services: CM Consult Post Acute Care Choice: Home Health, Durable Medical Equipment          DME Arranged: Wheelchair manual, Hospital bed, Other see comment (hoyer lift)         HH Arranged: PT, OT, Nurse's Aide HH Agency: [facility] Date [place]: 07/24/22 Time [place]: 1329 Representative spoke with at [place]: [place] Determinants of Health ([place]) Interventions [place]: No Food Insecurity (07/08/2022)  Housing: Low Risk  (07/08/2022)  Transportation Needs: No Transportation Needs (07/08/2022)  Utilities: Not At Risk (07/08/2022)  Tobacco Use: Medium Risk (07/17/2022)     Readmission Risk Interventions     No data to display

## 2022-07-24 NOTE — Progress Notes (Signed)
PROGRESS NOTE  [name]    DOB: 1929-03-30, 88 y.o.  IEP:[id number]    Code Status: Full Code   DOA: 07/16/2022   LOS: 7   Brief hospital course  [name] is a 88 y.o. female with a PMH significant for dementia, type 2 diabetes mellitus, hypertension, chronic diastolic CHF, CKD stage IV, seizure, and right distal femur fracture status post surgical repair on 07/03/2022 .  They presented from SNF to the ED on 07/16/2022 with AMS x 1 days. She also complained of urinary retention x3 days. She had recently been treated for UTI with rocephin on prior hospitalization.   In the ED, it was found that they had a rectal temp of 37.8 C, saturation of 100% on 2 L/min of supplemental oxygen, normal heart rate, and systolic blood pressure of 103 and greater.  Significant findings included no acute findings on head CT. CT of the abdomen pelvis demonstrates marked distention of the urinary bladder with intraluminal air and possibly air within the bladder wall, mild bilateral hydronephrosis, and no obstructive calculi identified.  Potassium was 5.8, bicarbonate 17, BUN 92, and creatinine 5.93.  Lactic acid reassuringly normal. Blood cultures were collected, urine culture was ordered   They were initially treated with foley catheter, IVF, and IV rocephin.   Patient was admitted to medicine service for further workup and management of acute urinary retention as outlined in detail below.  07/24/22 -stable. PT recommending SNF but daughter declines and will take her home.  TOC engaged for home health agency/equipment set up, appreciate your care. Patient is medically stable for dc when arrangements are made.   Assessment & Plan  Principal Problem:   Acute renal failure superimposed on stage 4 chronic kidney disease (HCC) Active Problems:   Chronic diastolic CHF (congestive heart failure) (HCC)   Type 2 diabetes mellitus with complication, without long-term current use of insulin (HCC)   Hypertension    history of seizures   Dementia (HCC)   Acute encephalopathy   Emphysematous cystitis   Hyperkalemia   Acute urinary retention   Complex renal cyst   Delirium   Dehydration   History of femur fracture   Essential hypertension   Urinary retention   Bilateral hydronephrosis  Emphysematous cystitis- in setting of acute urinary retention. UxCx growing Hafnia alvei sensitive to cipro. Same with blood cultures. She is afebrile and alert/oriented and normal WBC count. - discontinue oxybutynin  - change Abx to cipro per culture sensitivities and continue for total 10 days Abx course.    AKI superimposed on CKD IV  hyperkalemia  metabolic acidosis  K+ WNL. Cr is returned close to baseline. Taking PO so fluids discontinued. Repeat renal US showed resolved hydronephrosis. - continue foley catheter. Urology follow up OP. - monitor electrolytes and replete PRN   Acute encephalopathy- resolving. Patient close to baseline. No acute findings on head CT. Daughter states she is greatly improved. Appears to be at mental baseline. - PT/OT evaluation - delirium precautions  Home meds include eliquis for DVT ppx following recent hip surgery. Held on admission. - on lovenox ppx  Toenail darkness- daughter expressed concern for toenail discoloration. Requests podiatry consult as it will be difficult to transport OP to clinic. Patient denies toe pain. - podiatry consulted, appreciate your recs    Type II DM- A1c was 6.4% in August 2023 which is well below goal for her age. Is not insulin dependent at baseline - stop sliding scale. Monitor blood glucose with  labs and stat for suspected hyper/hypoglycemia   Normocytic anemia- hgb stable around 8.5-9.5. no signs of active bleeding - monitor periodically   Hypertension - Antihypertensives held on admission given concern for developing sepsis and SBP low 100s - restarted home antihypertensives including amlodipine and hydralazine, metoprolol   Dementia   - Delirium precautions  - Seroquel held on admission d/t somnolence     Hx of seizure  - Continue Keppra     Complex renal cyst  - Mildly complex left renal cyst noted on CT in ED  - Outpatient follow-up recommended with [place] in 6 months  Body mass index is 31.12 kg/m.  VTE ppx: heparin injection 5,000 Units Start: 07/17/22 0600  Diet:     Diet   DIET DYS 2 Room service appropriate? No; Fluid consistency: Thin   Consultants: Podiatry   Subjective 07/24/22    Pt reports no complaints today. She feels well. Denies SOB.   Objective   Vitals:   07/23/22 1525 07/23/22 2101 07/24/22 0000 07/24/22 0249  BP: (!) 142/91 (!) 155/57 137/66   Pulse:  64 62   Resp: '20 16 16   '$ Temp: (!) 97.5 F (36.4 C) 97.7 F (36.5 C) 97.9 F (36.6 C)   TempSrc: Oral Oral Oral   SpO2:  96% 96%   Weight:    74.7 kg  Height:       No intake or output data in the 24 hours ending 07/24/22 0734  Filed Weights   07/22/22 0450 07/23/22 0752 07/24/22 0249  Weight: 73.7 kg 73.9 kg 74.7 kg     Physical Exam:  General: awake, alert, NAD HEENT: atraumatic, clear conjunctiva, anicteric sclera, MMM, hard of hearing Respiratory: normal respiratory effort. CTAB Cardiovascular: quick capillary refill, RRR Nervous: A&O x3. no gross focal neurologic deficits, normal speech Extremities: moves all equally, no edema, normal tone Skin: dry, intact, normal temperature, normal color. No rashes, lesions or ulcers on exposed skin Psychiatry: mildly anxious mood, congruent affect  Labs   I have personally reviewed the following labs and imaging studies CBC    Component Value Date/Time   WBC 5.3 07/23/2022 0239   RBC 2.94 (L) 07/23/2022 0239   HGB 8.8 (L) 07/23/2022 0239   HCT 27.7 (L) 07/23/2022 0239   PLT 306 07/23/2022 0239   MCV 94.2 07/23/2022 0239   MCH 29.9 07/23/2022 0239   MCHC 31.8 07/23/2022 0239   RDW 17.6 (H) 07/23/2022 0239   LYMPHSABS 0.5 (L) 07/16/2022 2349   MONOABS 0.9 07/16/2022  2349   EOSABS 0.1 07/16/2022 2349   BASOSABS 0.0 07/16/2022 2349      Latest Ref Rng & Units 07/23/2022    2:39 AM 07/22/2022    3:30 AM 07/21/2022    2:16 AM  BMP  Glucose 70 - 99 mg/dL 110  98  95   BUN 8 - 23 mg/dL 40  46  68   Creatinine 0.44 - 1.00 mg/dL 1.66  1.90  2.56   Sodium 135 - 145 mmol/L 136  139  140   Potassium 3.5 - 5.1 mmol/L 4.8  4.7  5.0   Chloride 98 - 111 mmol/L 102  105  109   CO2 22 - 32 mmol/L '27  24  23   '$ Calcium 8.9 - 10.3 mg/dL 9.1  9.7  9.4    Results for orders placed or performed during the hospital encounter of 07/16/22  Urine Culture     Status: Abnormal   Collection Time: 07/16/22  11:25 PM   Specimen: In/Out Cath Urine  Result Value Ref Range Status   Specimen Description IN/OUT CATH URINE  Final   Special Requests   Final    NONE Performed at [facility], [address] [place], [place] [address]    Culture (A)  Final    >=100,000 COLONIES/mL HAFNIA ALVEI 60,000 COLONIES/mL ENTEROCOCCUS FAECIUM    Report Status 07/21/2022 FINAL  Final   Organism ID, Bacteria HAFNIA ALVEI (A)  Final   Organism ID, Bacteria ENTEROCOCCUS FAECIUM (A)  Final      Susceptibility   Enterococcus faecium - MIC*    AMPICILLIN <=2 SENSITIVE Sensitive     NITROFURANTOIN 32 SENSITIVE Sensitive     VANCOMYCIN <=0.5 SENSITIVE Sensitive     * 60,000 COLONIES/mL ENTEROCOCCUS FAECIUM   Hafnia alvei - MIC*    AMPICILLIN >=32 RESISTANT Resistant     CEFAZOLIN >=64 RESISTANT Resistant     CEFTRIAXONE >=64 RESISTANT Resistant     CIPROFLOXACIN <=0.25 SENSITIVE Sensitive     GENTAMICIN <=1 SENSITIVE Sensitive     IMIPENEM <=0.25 SENSITIVE Sensitive     NITROFURANTOIN <=16 SENSITIVE Sensitive     TRIMETH/SULFA <=20 SENSITIVE Sensitive     AMPICILLIN/SULBACTAM >=32 RESISTANT Resistant     PIP/TAZO >=128 RESISTANT Resistant     * >=100,000 COLONIES/mL HAFNIA ALVEI  Blood Culture (routine x 2)     Status: Abnormal   Collection Time: 07/16/22 11:42 PM   Specimen:  BLOOD  Result Value Ref Range Status   Specimen Description BLOOD LEFT ARM  Final   Special Requests   Final    BOTTLES DRAWN AEROBIC AND ANAEROBIC Blood Culture adequate volume   Culture  Setup Time   Final    ANAEROBIC BOTTLE ONLY GRAM NEGATIVE RODS CRITICAL VALUE NOTED.  VALUE IS CONSISTENT WITH PREVIOUSLY REPORTED AND CALLED VALUE.    Culture (A)  Final    HAFNIA ALVEI SUSCEPTIBILITIES PERFORMED ON PREVIOUS CULTURE WITHIN THE LAST 5 DAYS. Performed at [facility], [place] [address], [place], [place] [address]    Report Status 07/23/2022 FINAL  Final  Blood Culture (routine x 2)     Status: Abnormal   Collection Time: 07/16/22 11:49 PM   Specimen: BLOOD  Result Value Ref Range Status   Specimen Description BLOOD RIGHT ARM  Final   Special Requests   Final    BOTTLES DRAWN AEROBIC AND ANAEROBIC Blood Culture adequate volume   Culture  Setup Time   Final    GRAM NEGATIVE RODS ANAEROBIC BOTTLE ONLY CRITICAL RESULT CALLED TO, READ BACK BY AND VERIFIED WITH: PHARMD [place] ON 07/17/22 @ 1900 BY DRT Performed at [facility], [place] [address], [place], [place] [address]    Culture HAFNIA ALVEI (A)  Final   Report Status 07/19/2022 FINAL  Final   Organism ID, Bacteria HAFNIA ALVEI  Final      Susceptibility   Hafnia alvei - MIC*    AMPICILLIN >=32 RESISTANT Resistant     CEFAZOLIN >=64 RESISTANT Resistant     CEFTAZIDIME >=64 RESISTANT Resistant     CEFTRIAXONE >=64 RESISTANT Resistant     CIPROFLOXACIN <=0.25 SENSITIVE Sensitive     GENTAMICIN <=1 SENSITIVE Sensitive     IMIPENEM 0.5 SENSITIVE Sensitive     TRIMETH/SULFA <=20 SENSITIVE Sensitive     AMPICILLIN/SULBACTAM >=32 RESISTANT Resistant     PIP/TAZO >=128 RESISTANT Resistant     * HAFNIA ALVEI  Resp panel by  RT-PCR (RSV, Flu A&B, Covid) Anterior Nasal Swab     Status: None   Collection Time: 07/16/22 11:49 PM   Specimen: Anterior Nasal Swab  Result Value Ref Range Status   SARS Coronavirus 2 by RT PCR  NEGATIVE NEGATIVE Final    Comment: (NOTE) SARS-CoV-2 target nucleic acids are NOT DETECTED.  The SARS-CoV-2 RNA is generally detectable in upper respiratory specimens during the acute phase of infection. The lowest concentration of SARS-CoV-2 viral copies this assay can detect is 138 copies/mL. A negative result does not preclude SARS-Cov-2 infection and should not be used as the sole basis for treatment or other patient management decisions. A negative result may occur with  improper specimen collection/handling, submission of specimen other than nasopharyngeal swab, presence of viral mutation(s) within the areas targeted by this assay, and inadequate number of viral copies(<138 copies/mL). A negative result must be combined with clinical observations, patient history, and epidemiological information. The expected result is Negative.  Fact Sheet for Patients:  EntrepreneurPulse.com.au  Fact Sheet for Healthcare Providers:  IncredibleEmployment.be  This test is no t yet approved or cleared by the [place] FDA and  has been authorized for detection and/or diagnosis of SARS-CoV-2 by FDA under an Emergency Use Authorization (EUA). This EUA will remain  in effect (meaning this test can be used) for the duration of the COVID-19 declaration under Section 564(b)(1) of the Act, 21 U.S.C.section 360bbb-3(b)(1), unless the authorization is terminated  or revoked sooner.       Influenza A by PCR NEGATIVE NEGATIVE Final   Influenza B by PCR NEGATIVE NEGATIVE Final    Comment: (NOTE) The Xpert Xpress SARS-CoV-2/FLU/RSV plus assay is intended as an aid in the diagnosis of influenza from Nasopharyngeal swab specimens and should not be used as a sole basis for treatment. Nasal washings and aspirates are unacceptable for Xpert Xpress SARS-CoV-2/FLU/RSV testing.  Fact Sheet for Patients: EntrepreneurPulse.com.au  Fact Sheet for  Healthcare Providers: IncredibleEmployment.be  This test is not yet approved or cleared by the [place] FDA and has been authorized for detection and/or diagnosis of SARS-CoV-2 by FDA under an Emergency Use Authorization (EUA). This EUA will remain in effect (meaning this test can be used) for the duration of the COVID-19 declaration under Section 564(b)(1) of the Act, 21 U.S.C. section 360bbb-3(b)(1), unless the authorization is terminated or revoked.     Resp Syncytial Virus by PCR NEGATIVE NEGATIVE Final    Comment: (NOTE) Fact Sheet for Patients: EntrepreneurPulse.com.au  Fact Sheet for Healthcare Providers: IncredibleEmployment.be  This test is not yet approved or cleared by the [place] FDA and has been authorized for detection and/or diagnosis of SARS-CoV-2 by FDA under an Emergency Use Authorization (EUA). This EUA will remain in effect (meaning this test can be used) for the duration of the COVID-19 declaration under Section 564(b)(1) of the Act, 21 U.S.C. section 360bbb-3(b)(1), unless the authorization is terminated or revoked.  Performed at [facility], [place] [address], [place], [place] [address]   Blood Culture ID Panel (Reflexed)     Status: Abnormal   Collection Time: 07/16/22 11:49 PM  Result Value Ref Range Status   Enterococcus faecalis NOT DETECTED NOT DETECTED Final   Enterococcus Faecium NOT DETECTED NOT DETECTED Final   Listeria monocytogenes NOT DETECTED NOT DETECTED Final   Staphylococcus species NOT DETECTED NOT DETECTED Final   Staphylococcus aureus (BCID) NOT DETECTED NOT DETECTED Final   Staphylococcus epidermidis NOT DETECTED NOT DETECTED Final   Staphylococcus lugdunensis  NOT DETECTED NOT DETECTED Final   Streptococcus species NOT DETECTED NOT DETECTED Final   Streptococcus agalactiae NOT DETECTED NOT DETECTED Final   Streptococcus pneumoniae NOT DETECTED NOT DETECTED Final    Streptococcus pyogenes NOT DETECTED NOT DETECTED Final   A.calcoaceticus-baumannii NOT DETECTED NOT DETECTED Final   Bacteroides fragilis NOT DETECTED NOT DETECTED Final   Enterobacterales DETECTED (A) NOT DETECTED Final    Comment: Enterobacterales represent a large order of gram negative bacteria, not a single organism. Refer to culture for further identification. CRITICAL RESULT CALLED TO, READ BACK BY AND VERIFIED WITH: PHARMD LISA CURRAN ON 07/17/22 @ 1900 BY DRT    Enterobacter cloacae complex NOT DETECTED NOT DETECTED Final   Escherichia coli NOT DETECTED NOT DETECTED Final   Klebsiella aerogenes NOT DETECTED NOT DETECTED Final   Klebsiella oxytoca NOT DETECTED NOT DETECTED Final   Klebsiella pneumoniae NOT DETECTED NOT DETECTED Final   Proteus species NOT DETECTED NOT DETECTED Final   Salmonella species NOT DETECTED NOT DETECTED Final   Serratia marcescens NOT DETECTED NOT DETECTED Final   Haemophilus influenzae NOT DETECTED NOT DETECTED Final   Neisseria meningitidis NOT DETECTED NOT DETECTED Final   Pseudomonas aeruginosa NOT DETECTED NOT DETECTED Final   Stenotrophomonas maltophilia NOT DETECTED NOT DETECTED Final   Candida albicans NOT DETECTED NOT DETECTED Final   Candida auris NOT DETECTED NOT DETECTED Final   Candida glabrata NOT DETECTED NOT DETECTED Final   Candida krusei NOT DETECTED NOT DETECTED Final   Candida parapsilosis NOT DETECTED NOT DETECTED Final   Candida tropicalis NOT DETECTED NOT DETECTED Final   Cryptococcus neoformans/gattii NOT DETECTED NOT DETECTED Final   CTX-M ESBL NOT DETECTED NOT DETECTED Final   Carbapenem resistance IMP NOT DETECTED NOT DETECTED Final   Carbapenem resistance KPC NOT DETECTED NOT DETECTED Final   Carbapenem resistance NDM NOT DETECTED NOT DETECTED Final   Carbapenem resist OXA 48 LIKE NOT DETECTED NOT DETECTED Final   Carbapenem resistance VIM NOT DETECTED NOT DETECTED Final    Comment: Performed at [facility],  [address] [place], [place] [address]   Disposition Plan & Communication  Patient status: Inpatient  Admitted From: SNF Planned disposition location: Relative's home Anticipated discharge date: 12/27 pending PT/OT evaluation and safe disposition.   Family Communication: daughter on phone. Expresses wishes to take mom home at time of discharge    Author: Richarda Osmond, DO [facility] 07/24/2022, 7:34 AM   Available by Epic secure chat 7AM-7PM. If 7PM-7AM, please contact night-coverage.  [facility] contact information found on [email or web address].

## 2022-07-25 ENCOUNTER — Ambulatory Visit: Payer: Self-pay | Admitting: Podiatry

## 2022-07-25 ENCOUNTER — Other Ambulatory Visit (HOSPITAL_COMMUNITY): Payer: Self-pay

## 2022-07-25 DIAGNOSIS — G9340 Encephalopathy, unspecified: Secondary | ICD-10-CM | POA: Diagnosis not present

## 2022-07-25 DIAGNOSIS — F0390 Unspecified dementia without behavioral disturbance: Secondary | ICD-10-CM | POA: Diagnosis not present

## 2022-07-25 DIAGNOSIS — N179 Acute kidney failure, unspecified: Secondary | ICD-10-CM | POA: Diagnosis not present

## 2022-07-25 DIAGNOSIS — R339 Retention of urine, unspecified: Secondary | ICD-10-CM | POA: Diagnosis not present

## 2022-07-25 LAB — CBC
HCT: 30.6 % — ABNORMAL LOW (ref 36.0–46.0)
Hemoglobin: 9.6 g/dL — ABNORMAL LOW (ref 12.0–15.0)
MCH: 29.2 pg (ref 26.0–34.0)
MCHC: 31.4 g/dL (ref 30.0–36.0)
MCV: 93 fL (ref 80.0–100.0)
Platelets: 332 10*3/uL (ref 150–400)
RBC: 3.29 MIL/uL — ABNORMAL LOW (ref 3.87–5.11)
RDW: 17.4 % — ABNORMAL HIGH (ref 11.5–15.5)
WBC: 5 10*3/uL (ref 4.0–10.5)
nRBC: 0 % (ref 0.0–0.2)

## 2022-07-25 LAB — GLUCOSE, CAPILLARY
Glucose-Capillary: 95 mg/dL (ref 70–99)
Glucose-Capillary: 88 mg/dL (ref 70–99)
Glucose-Capillary: 92 mg/dL (ref 70–99)
Glucose-Capillary: 87 mg/dL (ref 70–99)

## 2022-07-25 LAB — BASIC METABOLIC PANEL
Anion gap: 9 (ref 5–15)
BUN: 27 mg/dL — ABNORMAL HIGH (ref 8–23)
CO2: 24 mmol/L (ref 22–32)
Calcium: 9 mg/dL (ref 8.9–10.3)
Chloride: 102 mmol/L (ref 98–111)
Creatinine, Ser: 1.57 mg/dL — ABNORMAL HIGH (ref 0.44–1.00)
GFR, Estimated: 31 mL/min — ABNORMAL LOW (ref >60)
Glucose, Bld: 93 mg/dL (ref 70–99)
Potassium: 3.9 mmol/L (ref 3.5–5.1)
Sodium: 135 mmol/L (ref 135–145)

## 2022-07-25 MED ORDER — ACETAMINOPHEN 500 MG PO TABS: 1000.0000 mg | ORAL_TABLET | Freq: Three times a day (TID) | ORAL | 0 refills | Status: AC | PRN

## 2022-07-25 MED ORDER — OXYCODONE HCL 5 MG PO TABS
5.0000 mg | ORAL_TABLET | Freq: Three times a day (TID) | ORAL | 0 refills | Status: DC | PRN
  Filled 2022-07-25: qty 20, 7d supply, fill #0

## 2022-07-25 MED ORDER — CIPROFLOXACIN HCL 500 MG PO TABS
500.0000 mg | ORAL_TABLET | Freq: Every day | ORAL | 0 refills | Status: DC
  Filled 2022-07-25: qty 8, 8d supply, fill #0

## 2022-07-25 NOTE — Discharge Summary (Signed)
PATIENT DETAILS Name: REDONNA WILBERT Age: 88 y.o. Sex: female Date of Birth: 1929/03/09 MRN: [id number]. Admitting Physician: Vianne Bulls, MD URK:[id number], Jori Moll, MD  Admit Date: 07/16/2022 Discharge date: 07/25/2022  Recommendations for Outpatient Follow-up:  Follow up with PCP in 1-2 weeks Please obtain CMP/CBC in one week Please ensure follow-up with urology Incidental finding-complex left renal cyst-repeat ultrasound 6 months.  Admitted From:  Home  Disposition: Home health (family refused SNF)   Discharge Condition: fair  CODE STATUS:   Code Status: Full Code   Diet recommendation:  Diet Order             Diet - low sodium heart healthy           DIET DYS 2 Room service appropriate? No; Fluid consistency: Thin  Diet effective now                    Brief Summary: 88 year old with dementia, DM-2, CKD stage IV, seizure disorder, recent right distal femur fracture-s/p ORIF 12/5-presented from SNF with acute metabolic encephalopathy in the setting of AKI and emphysematous cystitis.  She was subsequently found to have Hafnia Alvei bacteremia   Brief Hospital Course: Hafnia Alvei bacteremia  Emphysematous cystitis Clinically improved Afebrile Chart reviewed with infectious disease MD-Dr. Manandhar-recommendations are to complete 10 days of ciprofloxacin. Needs outpatient urology follow-up-see below.  AKI on CKD stage IV Mild bilateral hydronephrosis AKI felt to be due to obstructive uropathy in the setting of emphysematous cystitis causing hydronephrosis Foley catheter placed on admission-prior MD had discussed with urology-recommendations were to continue antibiotics-discharged with Foley catheter for voiding trial as an outpatient. Thankfully renal function has improved-and creatinine is back to baseline.  Repeat renal ultrasound shows resolution of hydronephrosis. Discussed with patient's daughter [name] over the phone on 12/27-she understands need for  Foley catheter-outpatient voiding trial.  Acute metabolic encephalopathy Felt to be from AKI-likely superimposed on dementia Overall mentation has improved-some intermittent delirium Stable for discharge-hopefully once she is back to her family surroundings-mentation will improve further.  Recent right distal femur fracture-s/p ORIF 12/5 Continue supportive care Discussed with orthopedic service-Keith Paul-PA-C on 12/27-okay to remove sutures-and does not need to continue with Eliquis for VTE prophylaxis.  DM-2 (A1c 6.02 March 2022) Continue to observe-PCP to follow.  HTN BP stable-continue amlodipine/hydralazine/metoprolol  Dementia Maintain delirium precautions Per daughter-patient no longer on Seroquel Resume mirtazapine on discharge.  History of seizure disorder Continue Keppra  Complex renal cyst Incidental finding Repeat ultrasound in 6 months  Longitudinal splitting of nail left hallux nail plate  Outpatient follow-up with podiatry.  Obesity: Estimated body mass index is 31.45 kg/m as calculated from the following:   Height as of this encounter: _0  (1.549 m).   Weight as of this encounter: 75.5 kg.    Discharge Diagnoses:  Principal Problem:   Acute renal failure superimposed on stage 4 chronic kidney disease (HCC) Active Problems:   Chronic diastolic CHF (congestive heart failure) (HCC)   Type 2 diabetes mellitus with complication, without long-term current use of insulin (HCC)   Hypertension   history of seizures   Dementia (HCC)   Acute encephalopathy   Emphysematous cystitis   Hyperkalemia   Acute urinary retention   Complex renal cyst   Delirium   Dehydration   History of femur fracture   Essential hypertension   Urinary retention   Bilateral hydronephrosis   Discharge Instructions:  Activity:  As tolerated with Full fall precautions use walker/cane & assistance  as needed  Discharge Instructions     Call MD for:  persistant dizziness  or light-headedness   Complete by: As directed    Call MD for:  redness, tenderness, or signs of infection (pain, swelling, redness, odor or green/yellow discharge around incision site)   Complete by: As directed    Call MD for:  severe uncontrolled pain   Complete by: As directed    Diet - low sodium heart healthy   Complete by: As directed    Discharge instructions   Complete by: As directed    Follow with Primary MD  Seward Carol, MD in 1-2 weeks  Please follow-up with [facility] for outpatient voiding trial (Foley catheter removal)  You incidentally found to have a left renal cyst on CT abdomen-please ask your primary care MD to repeat a renal ultrasound in 6 months.  Please get a complete blood count and chemistry panel checked by your Primary MD at your next visit, and again as instructed by your Primary MD.  Get Medicines reviewed and adjusted: Please take all your medications with you for your next visit with your Primary MD  Laboratory/radiological data: Please request your Primary MD to go over all hospital tests and procedure/radiological results at the follow up, please ask your Primary MD to get all Hospital records sent to his/her office.  In some cases, they will be blood work, cultures and biopsy results pending at the time of your discharge. Please request that your primary care M.D. follows up on these results.  Also Note the following: If you experience worsening of your admission symptoms, develop shortness of breath, life threatening emergency, suicidal or homicidal thoughts you must seek medical attention immediately by calling 911 or calling your MD immediately  if symptoms less severe.  You must read complete instructions/literature along with all the possible adverse reactions/side effects for all the Medicines you take and that have been prescribed to you. Take any new Medicines after you have completely understood and accpet all the possible adverse  reactions/side effects.   Do not drive when taking Pain medications or sleeping medications (Benzodaizepines)  Do not take more than prescribed Pain, Sleep and Anxiety Medications. It is not advisable to combine anxiety,sleep and pain medications without talking with your primary care practitioner  Special Instructions: If you have smoked or chewed Tobacco  in the last 2 yrs please stop smoking, stop any regular Alcohol  and or any Recreational drug use.  Wear Seat belts while driving.  Please note: You were cared for by a hospitalist during your hospital stay. Once you are discharged, your primary care physician will handle any further medical issues. Please note that NO REFILLS for any discharge medications will be authorized once you are discharged, as it is imperative that you return to your primary care physician (or establish a relationship with a primary care physician if you do not have one) for your post hospital discharge needs so that they can reassess your need for medications and monitor your lab values.   Increase activity slowly   Complete by: As directed       Allergies as of 07/25/2022       Reactions   Ace Inhibitors Swelling, Rash, Cough   Prandin [repaglinide] Other (See Comments)   Caused significant peripheral edema   Dilaudid [hydromorphone Hcl] Itching, Other (See Comments)   Hallucinations (auditory and visual) also   Other Nausea And Vomiting   "Tussionex Pennkinetic ER"   Latex Rash  Tape Rash   Prefers PAPER TAPE, PLEASE!!        Medication List     STOP taking these medications    apixaban 2.5 MG Tabs tablet Commonly known as: Eliquis   methocarbamol 500 MG tablet Commonly known as: ROBAXIN   QUEtiapine 25 MG tablet Commonly known as: SEROQUEL       TAKE these medications    acetaminophen 500 MG tablet Commonly known as: TYLENOL Take 2 tablets (1,000 mg total) by mouth every 8 (eight) hours as needed for mild pain or moderate  pain. What changed:  medication strength how much to take when to take this reasons to take this   allopurinol 100 MG tablet Commonly known as: ZYLOPRIM Take 100 mg by mouth 2 (two) times daily.   amLODipine 10 MG tablet Commonly known as: NORVASC Take 10 mg by mouth daily.   blood glucose meter kit and supplies Kit Dispense based on patient and insurance preference. Use up to four times daily as directed. (FOR ICD-9 250.00, 250.01). For QAC - HS accuchecks.   Centrum Silver 50+Women Tabs Take 1 tablet by mouth daily with breakfast.   ciprofloxacin 500 MG tablet Commonly known as: CIPRO Take 1 tablet (500 mg total) by mouth daily with breakfast. Start taking on: July 26, 2022   FreeStyle Libre 2 Sensor Misc USE AS DIRECTED. REAPPLY EVERY 14 DAYS What changed: See the new instructions.   gabapentin 100 MG capsule Commonly known as: NEURONTIN Take 100 mg by mouth 2 (two) times daily.   HYDRALAZINE HCL PO Take 1 tablet by mouth 3 (three) times daily.   Insulin Syringe-Needle U-100 25G X 1" 1 ML Misc For 4 times a day insulin SQ, 1 month supply. Diagnosis E11.65   levETIRAcetam 500 MG tablet Commonly known as: KEPPRA Take 500 mg by mouth in the morning.   metoprolol tartrate 100 MG tablet Commonly known as: LOPRESSOR Take 100 mg by mouth 2 (two) times daily.   mirtazapine 15 MG tablet Commonly known as: REMERON Take 15 mg by mouth at bedtime.   MUCINEX PO Take 2 tablets by mouth daily.   omeprazole 20 MG capsule Commonly known as: PRILOSEC Take 20 mg by mouth every Monday, Wednesday, and Friday.   oxyCODONE 5 MG immediate release tablet Commonly known as: Oxy IR/ROXICODONE Take 1 tablet (5 mg total) by mouth every 8 (eight) hours as needed for up to 7 days for breakthrough pain ((for MODERATE breakthrough pain)). What changed: when to take this               Durable Medical Equipment  (From admission, onward)           Start     Ordered    07/24/22 1320  For home use only DME lightweight manual wheelchair with seat cushion  Once       Comments: Patient suffers from d right distal femur fracture which impairs their ability to perform daily activities like bathing, dressing, feeding, grooming, and toileting in the home.  A walker will not resolve  issue with performing activities of daily living. A wheelchair will allow patient to safely perform daily activities. Patient is not able to propel themselves in the home using a standard weight wheelchair due to arm weakness, endurance, and general weakness. Patient can self propel in the lightweight wheelchair. Length of need Lifetime. Accessories: elevating leg rests (ELRs), wheel locks, extensions and anti-tippers. Patient needs 18 X 16 wheelchair   07/24/22 1320  07/24/22 1317  For home use only DME Other see comment  Once       Comments: Harrel Lemon lift  Question:  Length of Need  Answer:  Lifetime   07/24/22 1317   07/24/22 1312  For home use only DME Hospital bed  Once       Question Answer Comment  Length of Need Lifetime   Patient has (list medical condition): dementia, type 2 diabetes mellitus, hypertension, chronic diastolic CHF, CKD stage IV, seizure, and right distal femur fracture status post surgical repair on 07/03/2022 .   The above medical condition requires: Patient requires the ability to reposition frequently   Head must be elevated greater than: 30 degrees   Bed type Semi-electric   Hoyer Lift Yes   Support Surface: Gel Overlay      07/24/22 1317            Follow-up Information     Seward Carol, MD. Schedule an appointment as soon as possible for a visit in 1 week(s).   Specialty: Internal Medicine Contact information: [address] [place] [address] [phone]         [facility]. Schedule an appointment as soon as possible for a visit in 1 week(s).   Why: Hospital follow up Contact information: [place] [address] [phone]               Allergies  Allergen Reactions   Ace Inhibitors Swelling, Rash and Cough   Prandin [Repaglinide] Other (See Comments)    Caused significant peripheral edema   Dilaudid [Hydromorphone Hcl] Itching and Other (See Comments)    Hallucinations (auditory and visual) also   Other Nausea And Vomiting    "Tussionex Pennkinetic ER"   Latex Rash   Tape Rash    Prefers PAPER TAPE, PLEASE!!     Other Procedures/Studies: US RENAL  Result Date: 07/23/2022 CLINICAL DATA:  Hydronephrosis EXAM: RENAL / URINARY TRACT ULTRASOUND COMPLETE COMPARISON:  CT 07/17/2022 FINDINGS: Right Kidney: Renal measurements: 9.5 x 4.2 x 4.9 cm = volume: 102.4 mL. There are 2 simple appearing cysts, measuring up to 1.2 cm and 1.3 cm. Increased renal cortical echogenicity. Resolved hydronephrosis. Left Kidney: Renal measurements: 12.2 x 6.7 x 5.7 cm = volume: 246 mL. There is a 4.7 cm simple renal cyst. Hydronephrosis has resolved. Increased renal cortical echogenicity. Bladder: Decompressed with Foley catheter in place. Other: Small right pleural effusion noted. IMPRESSION: Resolved bilateral hydronephrosis after bladder decompression with Foley catheter. Increased renal cortical echogenicity bilaterally, as can be seen in medical renal disease. Electronically Signed   By: Maurine Simmering M.D.   On: 07/23/2022 10:47   CT ABDOMEN PELVIS WO CONTRAST  Result Date: 07/17/2022 CLINICAL DATA:  Acute abdominal pain EXAM: CT ABDOMEN AND PELVIS WITHOUT CONTRAST TECHNIQUE: Multidetector CT imaging of the abdomen and pelvis was performed following the standard protocol without IV contrast. RADIATION DOSE REDUCTION: This exam was performed according to the departmental dose-optimization program which includes automated exposure control, adjustment of the mA and/or kV according to patient size and/or use of iterative reconstruction technique. COMPARISON:  None  Available. FINDINGS: Lower chest: The heart is enlarged. There is atelectasis in the lung bases. Hepatobiliary: No focal liver abnormality is seen. Status post cholecystectomy. No biliary dilatation. Pancreas: Unremarkable. No pancreatic ductal dilatation or surrounding inflammatory changes. Spleen: Normal in size without focal abnormality. Adrenals/Urinary Tract: The bladder is markedly distended. There is a small amount  of air in the bladder and questionable air in the bladder wall anteriorly image 3/62 and inflammatory stranding surrounding the anterior portion of the bladder. There is mild bilateral hydroureteronephrosis. No obstructing calculi visualized. There is a mildly complex left renal cyst with thin peripheral calcifications measuring 4.5 cm in diameter. The adrenal glands are within normal limits. Stomach/Bowel: Stomach is within normal limits. Appendix appears normal. No evidence of bowel wall thickening, distention, or inflammatory changes. Small bowel anastomosis is present. Vascular/Lymphatic: Aortic atherosclerosis. No enlarged abdominal or pelvic lymph nodes. Reproductive: Status post hysterectomy. No adnexal masses. Other: No abdominal wall hernia or abnormality. No abdominopelvic ascites. Body wall edema present. Musculoskeletal: Bones are diffusely osteopenic. There is increased trabeculation in the right hemipelvis and within the L4 vertebral body which may be related to Paget's disease. IMPRESSION: 1. Markedly distended bladder with air in the bladder and questionable air in the bladder wall. There is surrounding inflammatory stranding. 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 2. Mild bilateral hydroureteronephrosis, likely secondary to bladder distension. No obstructing calculi visualized. 3. Mildly complex left renal cyst measuring 4.5 cm. Recommend follow-up ultrasound in 6 months to confirm stability. Aortic Atherosclerosis (ICD10-I70.0). Electronically Signed   By: Ronney Asters M.D.   On: 07/17/2022 01:51   CT Head Wo Contrast  Result Date: 07/17/2022 CLINICAL DATA:  Delirium. EXAM: CT HEAD WITHOUT CONTRAST TECHNIQUE: Contiguous axial images were obtained from the base of the skull through the vertex without intravenous contrast. RADIATION DOSE REDUCTION: This exam was performed according to the departmental dose-optimization program which includes automated exposure control, adjustment of the mA and/or kV according to patient size and/or use of iterative reconstruction technique. COMPARISON:  Head CT 07/01/2022.  MRI brain 05/02/2005. FINDINGS: Brain: No evidence of acute infarction, hemorrhage, hydrocephalus, extra-axial collection or mass lesion/mass effect. There stable mild periventricular white matter hypodensity, likely chronic small vessel ischemic change. Vascular: Atherosclerotic calcifications are present within the cavernous internal carotid arteries. Skull: Normal. Negative for fracture or focal lesion. Sinuses/Orbits: No acute finding. Other: None. IMPRESSION: No acute intracranial abnormality. Electronically Signed   By: Ronney Asters M.D.   On: 07/17/2022 01:41   DG Chest Port 1 View  Result Date: 07/16/2022 CLINICAL DATA:  Questionable sepsis - evaluate for abnormality EXAM: PORTABLE CHEST 1 VIEW COMPARISON:  chest x-ray 07/02/2022 FINDINGS: The heart and mediastinal contours are unchanged. Aortic calcification No focal consolidation. No pulmonary edema. No pleural effusion. No pneumothorax. No acute osseous abnormality. Bilateral shoulder degenerative changes. IMPRESSION: 1. No active disease. 2.  Aortic Atherosclerosis (ICD10-I70.0). Electronically Signed   By: Iven Finn M.D.   On: 07/16/2022 23:41   DG Knee Right Port  Result Date: 07/03/2022 CLINICAL DATA:  Closed fracture right distal femur status post ORIF. EXAM: PORTABLE RIGHT KNEE - 1-2 VIEW COMPARISON:  Right knee radiographs 06/30/2022 FINDINGS: Interval lateral plate and screw  fixation of the previously seen markedly comminuted distal fibular diaphyseal and metaphyseal fracture. There is again mild diastasis of multiple distal fibular fracture fragments, however the fixation hardware screws are distal and proximal to this, and no hardware failure is seen. Redemonstration of total right knee arthroplasty hardware. Expected postoperative air within the lateral thigh and knee joint. IMPRESSION: Interval lateral plate and screw fixation of the previously seen markedly comminuted distal fibular diaphyseal and metaphyseal fracture. No evidence of hardware failure. Electronically Signed   By: Yvonne Kendall M.D.   On: 07/03/2022 12:46   DG FEMUR, MIN 2 VIEWS RIGHT  Result Date: 07/03/2022  CLINICAL DATA:  ORIF right femur. Elective surgery. Intraoperative fluoroscopy. EXAM: RIGHT FEMUR 2 VIEWS COMPARISON:  Right knee radiographs 06/30/2022, right hip radiographs 06/30/2022 FINDINGS: Images were performed intraoperatively without the presence of a radiologist. Interval lateral plate and screw fixation of the distal fibula, fixating the previously seen comminuted and displaced distal femoral diaphyseal metaphyseal fracture. Improved alignment. Redemonstration of total right knee arthroplasty hardware. Total fluoroscopy images: 6 Total fluoroscopy time: 48 seconds Total dose: Radiation Exposure Index (as provided by the fluoroscopic device): 5.51 mGy air Kerma Please see intraoperative findings for further detail. IMPRESSION: Intraoperative fluoroscopic guidance for ORIF of the distal right femur. Electronically Signed   By: Yvonne Kendall M.D.   On: 07/03/2022 11:26   DG C-Arm 1-60 Min-No Report  Result Date: 07/03/2022 Fluoroscopy was utilized by the requesting physician.  No radiographic interpretation.   DG Wrist Complete Right  Result Date: 07/02/2022 CLINICAL DATA:  Forearm and wrist pain. EXAM: RIGHT WRIST - COMPLETE 3+ VIEW; RIGHT FOREARM - 2 VIEW COMPARISON:  None Available.  FINDINGS: No acute fracture or dislocation. No aggressive osseous lesion. Normal alignment. Generalized osteopenia. Mild osteoarthritis of the first [facility] joint and first MCP joint. Moderate osteoarthritis of the first IP joint. Soft tissue are unremarkable. No radiopaque foreign body or soft tissue emphysema. Peripheral vascular atherosclerotic disease. IMPRESSION: 1. No acute osseous injury of the right wrist and forearm. 2. Mild osteoarthritis of the first [place] joint and first MCP joint. Electronically Signed   By: Kathreen Devoid M.D.   On: 07/02/2022 11:41   DG Forearm Right  Result Date: 07/02/2022 CLINICAL DATA:  Forearm and wrist pain. EXAM: RIGHT WRIST - COMPLETE 3+ VIEW; RIGHT FOREARM - 2 VIEW COMPARISON:  None Available. FINDINGS: No acute fracture or dislocation. No aggressive osseous lesion. Normal alignment. Generalized osteopenia. Mild osteoarthritis of the first [facility] joint and first MCP joint. Moderate osteoarthritis of the first IP joint. Soft tissue are unremarkable. No radiopaque foreign body or soft tissue emphysema. Peripheral vascular atherosclerotic disease. IMPRESSION: 1. No acute osseous injury of the right wrist and forearm. 2. Mild osteoarthritis of the first [place] joint and first MCP joint. Electronically Signed   By: Kathreen Devoid M.D.   On: 07/02/2022 11:41   DG Chest 1 View  Result Date: 07/02/2022 CLINICAL DATA:  [id number] Repeat prescription issue [id number] EXAM: CHEST  1 VIEW COMPARISON:  06/30/2022 chest radiograph. FINDINGS: Surgical clips again noted in the left axilla. Stable cardiomediastinal silhouette with mild cardiomegaly. No pneumothorax. No pleural effusion. No overt pulmonary edema. Mild streaky bibasilar scarring versus atelectasis, unchanged. IMPRESSION: 1. Stable mild cardiomegaly without overt pulmonary edema. 2. Stable mild streaky bibasilar scarring versus atelectasis. Electronically Signed   By: Ilona Sorrel M.D.   On: 07/02/2022 09:26   US RENAL  Result Date:  07/01/2022 CLINICAL DATA:  Acute on chronic renal failure with stage IV CKD. EXAM: RENAL / URINARY TRACT ULTRASOUND COMPLETE COMPARISON:  Renal ultrasound 03/11/2022. FINDINGS: Right Kidney: Renal measurements: 10.3 x 4.1 x 4.5 cm = volume: 99.4 mL, previously 93.9 mL but probably unchanged accounting for technical factors. There is mild-to-moderate increased cortical echogenicity, mild cortical volume loss. No mass, stones or hydronephrosis visualized. There is a 1.2 cm simple cyst in the outer upper pole, and a 1.2 cm simple parapelvic cyst at the midpole. No new abnormality. Left Kidney: Renal measurements: 12.0 x 7.2 x 5.2 cm = volume: 232.6 mL, but probably overestimated as the AP axis was measured from the hilum to the  outer aspect of a nearly 5 cm cyst. Echogenicity is increased. 4.8 cm mid/lower pole simple cyst is again shown. No mass, stones or hydronephrosis visualized. Bladder: Catheterized, contracted and obscured by bowel gas. Other: None. IMPRESSION: 1. No acute findings. No hydronephrosis. 2. Findings of chronic medical renal disease. 3. Bilateral renal cysts. 4. Accounting for technical factors there is no appreciable interval change from 03/11/2022. Electronically Signed   By: Telford Nab M.D.   On: 07/01/2022 23:59   CT HEAD WO CONTRAST (5MM)  Result Date: 07/01/2022 CLINICAL DATA:  Head trauma EXAM: CT HEAD WITHOUT CONTRAST TECHNIQUE: Contiguous axial images were obtained from the base of the skull through the vertex without intravenous contrast. RADIATION DOSE REDUCTION: This exam was performed according to the departmental dose-optimization program which includes automated exposure control, adjustment of the mA and/or kV according to patient size and/or use of iterative reconstruction technique. COMPARISON:  None Available. FINDINGS: Brain: There is no mass, hemorrhage or extra-axial collection. The size and configuration of the ventricles and extra-axial CSF spaces are normal. There is  hypoattenuation of the white matter, most commonly indicating chronic small vessel disease. Vascular: No abnormal hyperdensity of the major intracranial arteries or dural venous sinuses. No intracranial atherosclerosis. Skull: The visualized skull base, calvarium and extracranial soft tissues are normal. Sinuses/Orbits: No fluid levels or advanced mucosal thickening of the visualized paranasal sinuses. No mastoid or middle ear effusion. The orbits are normal. IMPRESSION: 1. No acute intracranial abnormality. 2. Chronic small vessel disease. Electronically Signed   By: Ulyses Jarred M.D.   On: 07/01/2022 21:43   DG HIP UNILAT WITH PELVIS 1V RIGHT  Result Date: 06/30/2022 CLINICAL DATA:  Closed fracture distal femur.  [id number]. EXAM: DG HIP (WITH OR WITHOUT PELVIS) 1V RIGHT; RIGHT KNEE - 1-2 VIEW COMPARISON:  Immediate postoperative films following right knee replacement 11/24/2008, abdomen film including the pelvis and hips on 10/13/2020. FINDINGS: AP pelvis and separate AP right hip view: Osteopenia. No displaced fracture is seen of the pelvis and proximal femurs. Symmetric joint space loss is again noted both superior hips, small acetabular osteophytes and mild pelvic enthesopathy with spurring at the SI joints. Intestinal postsurgical changes are noted in the left lower abdomen, as before. Single surgical clip at the level of the mid right hemisacrum. 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 Right knee AP Lat only, portable: Osteopenia. 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 There is a markedly comminuted distal femoral shaft fracture extending to the metaphyseal bone alongside the hardware. There is a mild spreading of the comminution fragments, the main distal fragment posteriorly displaced about 1/2 of a shaft width, laterally displaced about 1/3 of a shaft width with slight apex dorsal angulation and a slight valgus angulation. There are calcifications in the  femoral, popliteal and popliteal trifurcation arteries. There is no further evidence of fractures. IMPRESSION: 1. Osteopenia and degenerative change without evidence of displaced fracture of the pelvis and proximal femurs. 2. Markedly comminuted distal femoral shaft fracture extending to the metaphyseal bone alongside the TKA hardware, with a mild spreading of the comminution fragments, the main distal fragment posteriorly displaced about 1/2 of a shaft width, laterally displaced 1/3 of a shaft width with slight apex dorsal and valgus angulation. Electronically Signed   By: Telford Nab M.D.   On: 06/30/2022 20:54   DG Knee 1-2 Views Right  Result Date: 06/30/2022 CLINICAL DATA:  Closed fracture distal femur.  [id number]. EXAM: DG HIP (WITH OR WITHOUT PELVIS)  1V RIGHT; RIGHT KNEE - 1-2 VIEW COMPARISON:  Immediate postoperative films following right knee replacement 11/24/2008, abdomen film including the pelvis and hips on 10/13/2020. FINDINGS: AP pelvis and separate AP right hip view: Osteopenia. No displaced fracture is seen of the pelvis and proximal femurs. Symmetric joint space loss is again noted both superior hips, small acetabular osteophytes and mild pelvic enthesopathy with spurring at the SI joints. Intestinal postsurgical changes are noted in the left lower abdomen, as before. Single surgical clip at the level of the mid right hemisacrum. 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 Right knee AP Lat only, portable: Osteopenia. 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 There is a markedly comminuted distal femoral shaft fracture extending to the metaphyseal bone alongside the hardware. There is a mild spreading of the comminution fragments, the main distal fragment posteriorly displaced about 1/2 of a shaft width, laterally displaced about 1/3 of a shaft width with slight apex dorsal angulation and a slight valgus angulation. There are calcifications in the  femoral, popliteal and popliteal trifurcation arteries. There is no further evidence of fractures. IMPRESSION: 1. Osteopenia and degenerative change without evidence of displaced fracture of the pelvis and proximal femurs. 2. Markedly comminuted distal femoral shaft fracture extending to the metaphyseal bone alongside the TKA hardware, with a mild spreading of the comminution fragments, the main distal fragment posteriorly displaced about 1/2 of a shaft width, laterally displaced 1/3 of a shaft width with slight apex dorsal and valgus angulation. Electronically Signed   By: Telford Nab M.D.   On: 06/30/2022 20:54   DG CHEST PORT 1 VIEW  Result Date: 06/30/2022 CLINICAL DATA:  Hypoxia EXAM: PORTABLE CHEST 1 VIEW COMPARISON:  Chest x-ray 03/10/2022 FINDINGS: Heart is enlarged, unchanged. There is some scattered interstitial opacities which may be chronic. There is no focal consolidation, pleural effusion or pneumothorax. Left axillary surgical clips are present. There are atherosclerotic calcifications of the aorta. There severe degenerative changes of both shoulders. IMPRESSION: 1. Cardiomegaly. 2. Scattered interstitial opacities which may be chronic. No focal consolidation. Electronically Signed   By: Ronney Asters M.D.   On: 06/30/2022 19:21     TODAY-DAY OF DISCHARGE:  Subjective:   Shaima Sardinas today has had no major issues overnight-remains pleasantly confused.  Objective:   Blood pressure (!) 133/49, pulse (!) 58, temperature 97.7 F (36.5 C), temperature source Axillary, resp. rate 18, height _0  (1.549 m), weight 75.5 kg, SpO2 96 %.  Intake/Output Summary (Last 24 hours) at 07/25/2022 1052 Last data filed at 07/25/2022 0500 Gross per 24 hour  Intake --  Output 750 ml  Net -750 ml   Filed Weights   07/23/22 0752 07/24/22 0249 07/25/22 0500  Weight: 73.9 kg 74.7 kg 75.5 kg    Exam: Pleasantly confused, No new F.N deficit [place].AT,PERRAL Supple Neck,No JVD, No cervical  lymphadenopathy appriciated.  Symmetrical Chest wall movement, Good air movement bilaterally, CTAB RRR,No Gallops,Rubs or new Murmurs, No Parasternal Heave +ve B.Sounds, Abd Soft, Non tender, No organomegaly appriciated, No rebound -guarding or rigidity. No Cyanosis, Clubbing or edema, No new Rash or bruise   PERTINENT RADIOLOGIC STUDIES: No results found.   PERTINENT LAB RESULTS: CBC: Recent Labs    07/24/22 0644 07/25/22 0042  WBC 5.1 5.0  HGB 10.2* 9.6*  HCT 31.1* 30.6*  PLT 332 332   CMET CMP     Component Value Date/Time   NA 135 07/25/2022 0042   K 3.9 07/25/2022 0042   CL  102 07/25/2022 0042   CO2 24 07/25/2022 0042   GLUCOSE 93 07/25/2022 0042   BUN 27 (H) 07/25/2022 0042   CREATININE 1.57 (H) 07/25/2022 0042   CALCIUM 9.0 07/25/2022 0042   PROT 6.9 07/16/2022 2349   ALBUMIN 2.1 (L) 07/16/2022 2349   AST 40 07/16/2022 2349   ALT 22 07/16/2022 2349   ALKPHOS 119 07/16/2022 2349   BILITOT 0.4 07/16/2022 2349   GFRNONAA 31 (L) 07/25/2022 0042   GFRAA 33 (L) 07/29/2017 1758    GFR Estimated Creatinine Clearance: 21.7 mL/min (A) (by C-G formula based on SCr of 1.57 mg/dL (H)). No results for input(s): "LIPASE", "AMYLASE" in the last 72 hours. No results for input(s): "CKTOTAL", "CKMB", "CKMBINDEX", "TROPONINI" in the last 72 hours. Invalid input(s): "POCBNP" No results for input(s): "DDIMER" in the last 72 hours. No results for input(s): "HGBA1C" in the last 72 hours. No results for input(s): "CHOL", "HDL", "LDLCALC", "TRIG", "CHOLHDL", "LDLDIRECT" in the last 72 hours. No results for input(s): "TSH", "T4TOTAL", "T3FREE", "THYROIDAB" in the last 72 hours.  Invalid input(s): "FREET3" No results for input(s): "VITAMINB12", "FOLATE", "FERRITIN", "TIBC", "IRON", "RETICCTPCT" in the last 72 hours. Coags: No results for input(s): "INR" in the last 72 hours.  Invalid input(s): "PT" Microbiology: Recent Results (from the past 240 hour(s))  Urine Culture      Status: Abnormal   Collection Time: 07/16/22 11:25 PM   Specimen: In/Out Cath Urine  Result Value Ref Range Status   Specimen Description IN/OUT CATH URINE  Final   Special Requests   Final    NONE Performed at [facility], [address] [place], [place] [address]    Culture (A)  Final    >=100,000 COLONIES/mL HAFNIA ALVEI 60,000 COLONIES/mL ENTEROCOCCUS FAECIUM    Report Status 07/21/2022 FINAL  Final   Organism ID, Bacteria HAFNIA ALVEI (A)  Final   Organism ID, Bacteria ENTEROCOCCUS FAECIUM (A)  Final      Susceptibility   Enterococcus faecium - MIC*    AMPICILLIN <=2 SENSITIVE Sensitive     NITROFURANTOIN 32 SENSITIVE Sensitive     VANCOMYCIN <=0.5 SENSITIVE Sensitive     * 60,000 COLONIES/mL ENTEROCOCCUS FAECIUM   Hafnia alvei - MIC*    AMPICILLIN >=32 RESISTANT Resistant     CEFAZOLIN >=64 RESISTANT Resistant     CEFTRIAXONE >=64 RESISTANT Resistant     CIPROFLOXACIN <=0.25 SENSITIVE Sensitive     GENTAMICIN <=1 SENSITIVE Sensitive     IMIPENEM <=0.25 SENSITIVE Sensitive     NITROFURANTOIN <=16 SENSITIVE Sensitive     TRIMETH/SULFA <=20 SENSITIVE Sensitive     AMPICILLIN/SULBACTAM >=32 RESISTANT Resistant     PIP/TAZO >=128 RESISTANT Resistant     * >=100,000 COLONIES/mL HAFNIA ALVEI  Blood Culture (routine x 2)     Status: Abnormal   Collection Time: 07/16/22 11:42 PM   Specimen: BLOOD  Result Value Ref Range Status   Specimen Description BLOOD LEFT ARM  Final   Special Requests   Final    BOTTLES DRAWN AEROBIC AND ANAEROBIC Blood Culture adequate volume   Culture  Setup Time   Final    ANAEROBIC BOTTLE ONLY GRAM NEGATIVE RODS CRITICAL VALUE NOTED.  VALUE IS CONSISTENT WITH PREVIOUSLY REPORTED AND CALLED VALUE.    Culture (A)  Final    HAFNIA ALVEI SUSCEPTIBILITIES PERFORMED ON PREVIOUS CULTURE WITHIN THE LAST 5 DAYS. Performed at [facility], [place] [address], [place], [place] [address]    Report Status 07/23/2022 FINAL  Final  Blood Culture  (routine x 2)     Status: Abnormal   Collection Time: 07/16/22 11:49 PM   Specimen: BLOOD  Result Value Ref Range Status   Specimen Description BLOOD RIGHT ARM  Final   Special Requests   Final    BOTTLES DRAWN AEROBIC AND ANAEROBIC Blood Culture adequate volume   Culture  Setup Time   Final    GRAM NEGATIVE RODS ANAEROBIC BOTTLE ONLY CRITICAL RESULT CALLED TO, READ BACK BY AND VERIFIED WITH: PHARMD LISA CURRAN ON 07/17/22 @ 1900 BY DRT Performed at [facility], [address], [place],  [address]    Culture HAFNIA ALVEI (A)  Final   Report Status 07/19/2022 FINAL  Final   Organism ID, Bacteria HAFNIA ALVEI  Final      Susceptibility   Hafnia alvei - MIC*    AMPICILLIN >=32 RESISTANT Resistant     CEFAZOLIN >=64 RESISTANT Resistant     CEFTAZIDIME >=64 RESISTANT Resistant     CEFTRIAXONE >=64 RESISTANT Resistant     CIPROFLOXACIN <=0.25 SENSITIVE Sensitive     GENTAMICIN <=1 SENSITIVE Sensitive     IMIPENEM 0.5 SENSITIVE Sensitive     TRIMETH/SULFA <=20 SENSITIVE Sensitive     AMPICILLIN/SULBACTAM >=32 RESISTANT Resistant     PIP/TAZO >=128 RESISTANT Resistant     * HAFNIA ALVEI  Resp panel by RT-PCR (RSV, Flu A&B, Covid) Anterior Nasal Swab     Status: None   Collection Time: 07/16/22 11:49 PM   Specimen: Anterior Nasal Swab  Result Value Ref Range Status   SARS Coronavirus 2 by RT PCR NEGATIVE NEGATIVE Final    Comment: (NOTE) SARS-CoV-2 target nucleic acids are NOT DETECTED.  The SARS-CoV-2 RNA is generally detectable in upper respiratory specimens during the acute phase of infection. The lowest concentration of SARS-CoV-2 viral copies this assay can detect is 138 copies/mL. A negative result does not preclude SARS-Cov-2 infection and should not be used as the sole basis for treatment or other patient management decisions. A negative result may occur with  improper specimen collection/handling, submission of specimen other than nasopharyngeal swab,  presence of viral mutation(s) within the areas targeted by this assay, and inadequate number of viral copies(<138 copies/mL). A negative result must be combined with clinical observations, patient history, and epidemiological information. The expected result is Negative.  Fact Sheet for Patients:  EntrepreneurPulse.com.au  Fact Sheet for Healthcare Providers:  IncredibleEmployment.be  This test is no t yet approved or cleared by the [place] FDA and  has been authorized for detection and/or diagnosis of SARS-CoV-2 by FDA under an Emergency Use Authorization (EUA). This EUA will remain  in effect (meaning this test can be used) for the duration of the COVID-19 declaration under Section 564(b)(1) of the Act, 21 U.S.C.section 360bbb-3(b)(1), unless the authorization is terminated  or revoked sooner.       Influenza A by PCR NEGATIVE NEGATIVE Final   Influenza B by PCR NEGATIVE NEGATIVE Final    Comment: (NOTE) The Xpert Xpress SARS-CoV-2/FLU/RSV plus assay is intended as an aid in the diagnosis of influenza from Nasopharyngeal swab specimens and should not be used as a sole basis for treatment. Nasal washings and aspirates are unacceptable for Xpert Xpress SARS-CoV-2/FLU/RSV testing.  Fact Sheet for Patients: EntrepreneurPulse.com.au  Fact Sheet for Healthcare Providers: IncredibleEmployment.be  This test is not yet approved or cleared by the [place] FDA and has been authorized for detection and/or diagnosis of SARS-CoV-2 by FDA under an  Emergency Use Authorization (EUA). This EUA will remain in effect (meaning this test can be used) for the duration of the COVID-19 declaration under Section 564(b)(1) of the Act, 21 U.S.C. section 360bbb-3(b)(1), unless the authorization is terminated or revoked.     Resp Syncytial Virus by PCR NEGATIVE NEGATIVE Final    Comment: (NOTE) Fact Sheet for  Patients: EntrepreneurPulse.com.au  Fact Sheet for Healthcare Providers: IncredibleEmployment.be  This test is not yet approved or cleared by the [place] FDA and has been authorized for detection and/or diagnosis of SARS-CoV-2 by FDA under an Emergency Use Authorization (EUA). This EUA will remain in effect (meaning this test can be used) for the duration of the COVID-19 declaration under Section 564(b)(1) of the Act, 21 U.S.C. section 360bbb-3(b)(1), unless the authorization is terminated or revoked.  Performed at [facility], [place] [address], [place], [place] [address]   Blood Culture ID Panel (Reflexed)     Status: Abnormal   Collection Time: 07/16/22 11:49 PM  Result Value Ref Range Status   Enterococcus faecalis NOT DETECTED NOT DETECTED Final   Enterococcus Faecium NOT DETECTED NOT DETECTED Final   Listeria monocytogenes NOT DETECTED NOT DETECTED Final   Staphylococcus species NOT DETECTED NOT DETECTED Final   Staphylococcus aureus (BCID) NOT DETECTED NOT DETECTED Final   Staphylococcus epidermidis NOT DETECTED NOT DETECTED Final   Staphylococcus lugdunensis NOT DETECTED NOT DETECTED Final   Streptococcus species NOT DETECTED NOT DETECTED Final   Streptococcus agalactiae NOT DETECTED NOT DETECTED Final   Streptococcus pneumoniae NOT DETECTED NOT DETECTED Final   Streptococcus pyogenes NOT DETECTED NOT DETECTED Final   A.calcoaceticus-baumannii NOT DETECTED NOT DETECTED Final   Bacteroides fragilis NOT DETECTED NOT DETECTED Final   Enterobacterales DETECTED (A) NOT DETECTED Final    Comment: Enterobacterales represent a large order of gram negative bacteria, not a single organism. Refer to culture for further identification. CRITICAL RESULT CALLED TO, READ BACK BY AND VERIFIED WITH: PHARMD LISA CURRAN ON 07/17/22 @ 1900 BY DRT    Enterobacter cloacae complex NOT DETECTED NOT DETECTED Final   Escherichia coli NOT DETECTED NOT  DETECTED Final   Klebsiella aerogenes NOT DETECTED NOT DETECTED Final   Klebsiella oxytoca NOT DETECTED NOT DETECTED Final   Klebsiella pneumoniae NOT DETECTED NOT DETECTED Final   Proteus species NOT DETECTED NOT DETECTED Final   Salmonella species NOT DETECTED NOT DETECTED Final   Serratia marcescens NOT DETECTED NOT DETECTED Final   Haemophilus influenzae NOT DETECTED NOT DETECTED Final   Neisseria meningitidis NOT DETECTED NOT DETECTED Final   Pseudomonas aeruginosa NOT DETECTED NOT DETECTED Final   Stenotrophomonas maltophilia NOT DETECTED NOT DETECTED Final   Candida albicans NOT DETECTED NOT DETECTED Final   Candida auris NOT DETECTED NOT DETECTED Final   Candida glabrata NOT DETECTED NOT DETECTED Final   Candida krusei NOT DETECTED NOT DETECTED Final   Candida parapsilosis NOT DETECTED NOT DETECTED Final   Candida tropicalis NOT DETECTED NOT DETECTED Final   Cryptococcus neoformans/gattii NOT DETECTED NOT DETECTED Final   CTX-M ESBL NOT DETECTED NOT DETECTED Final   Carbapenem resistance IMP NOT DETECTED NOT DETECTED Final   Carbapenem resistance KPC NOT DETECTED NOT DETECTED Final   Carbapenem resistance NDM NOT DETECTED NOT DETECTED Final   Carbapenem resist OXA 48 LIKE NOT DETECTED NOT DETECTED Final   Carbapenem resistance VIM NOT DETECTED NOT DETECTED Final    Comment: Performed at [facility], [address], [place], [place] [address]  FURTHER DISCHARGE INSTRUCTIONS:  Get Medicines reviewed and adjusted: Please take all your medications with you for your next visit with your Primary MD  Laboratory/radiological data: Please request your Primary MD to go over all hospital tests and procedure/radiological results at the follow up, please ask your Primary MD to get all Hospital records sent to his/her office.  In some cases, they will be blood work, cultures and biopsy results pending at the time of your discharge. Please request that your primary care M.D. goes  through all the records of your hospital data and follows up on these results.  Also Note the following: If you experience worsening of your admission symptoms, develop shortness of breath, life threatening emergency, suicidal or homicidal thoughts you must seek medical attention immediately by calling 911 or calling your MD immediately  if symptoms less severe.  You must read complete instructions/literature along with all the possible adverse reactions/side effects for all the Medicines you take and that have been prescribed to you. Take any new Medicines after you have completely understood and accpet all the possible adverse reactions/side effects.   Do not drive when taking Pain medications or sleeping medications (Benzodaizepines)  Do not take more than prescribed Pain, Sleep and Anxiety Medications. It is not advisable to combine anxiety,sleep and pain medications without talking with your primary care practitioner  Special Instructions: If you have smoked or chewed Tobacco  in the last 2 yrs please stop smoking, stop any regular Alcohol  and or any Recreational drug use.  Wear Seat belts while driving.  Please note: You were cared for by a hospitalist during your hospital stay. Once you are discharged, your primary care physician will handle any further medical issues. Please note that NO REFILLS for any discharge medications will be authorized once you are discharged, as it is imperative that you return to your primary care physician (or establish a relationship with a primary care physician if you do not have one) for your post hospital discharge needs so that they can reassess your need for medications and monitor your lab values.  Total Time spent coordinating discharge including counseling, education and face to face time equals greater than 30 minutes.  SignedOren Binet 07/25/2022 10:52 AM

## 2022-07-25 NOTE — Progress Notes (Signed)
Pt's daughter requested that her mom is not to be discharged or release from the hospital before 0800 and or after 1900. PTAR arrived at 2145 and left do to daughter's request and statement over the phone. Charge Nurse and MD notified.

## 2022-07-25 NOTE — TOC Transition Note (Addendum)
Transition of Care [facility]) - CM/SW Discharge Note   Patient Details  Name: [name] MRN: [id number] Date of Birth: 01-Feb-1929  [facility] CM/SW Contact:  Cyndi Bender, RN Phone Number: 07/25/2022, 1:22 PM   Clinical Narrative:     Patient stable for discharge.  Parke Simmers, daughter, is agreeable for patient to discharge home today by  [facility] after DME has been delivered. Notified the bedside RN to educate Parke Simmers on how to care for the foley catheter.  Cory with [facility] notified of discharge.   Address, Phone number and PCP verified.  Parke Simmers will call this RNCM once DME has been delivered.   [place] notified this RNCM that [employer] brought the wrong kind of DME. Parke Simmers requested [employer] to bring electric bed and hoyer lift yesterday when The daughter spoke to [employer].  1150 This RNCM notified  [name] with [employer] that the incorrect DME was brought to the home. [name] stated she would have the correct DME delivered.   1348 This RNCM reached out to [place] with [employer] inquiring when the DME will be delivered. [name] stated she will check on the scheduling time.   1548 This RNCM reached out to [facility] for an estimated ETA of DME delivery. [name] states she will get an ETA and notify this [place] reached out stating the DME is on the truck but there are orders that will be dropped off before the patients.   [place] called and stated [employer] just called and will be there to deliver DME at 1700.  Ptar has been called to transport patient home at 1830. Parke Simmers is aware.  Final next level of care: Home w Home Health Services Barriers to Discharge: Barriers Resolved   Patient Goals and CMS Choice CMS Medicare.gov Compare Post Acute Care list provided to:: Patient Represenative (must comment) Choice offered to / list presented to : Adult Children  Discharge Placement    Home with daughter                      Discharge Plan and  Services Additional resources added to the After Visit Summary for     Discharge Planning Services: CM Consult Post Acute Care Choice: Home Health, Durable Medical Equipment          DME Arranged: Wheelchair manual, Hospital bed, Other see comment (hoyer lift)         HH Arranged: PT, OT, Nurse's Aide HH Agency: [place] Date [place]: 07/24/22 Time [place]: 1329 Representative spoke with at [place]: [place] Determinants of Health ([place]) Interventions [place]: No Food Insecurity (07/08/2022)  Housing: Low Risk  (07/08/2022)  Transportation Needs: No Transportation Needs (07/08/2022)  Utilities: Not At Risk (07/08/2022)  Tobacco Use: Medium Risk (07/17/2022)     Readmission Risk Interventions     No data to display

## 2022-07-25 NOTE — Plan of Care (Signed)
  Problem: Pain Management: Goal: Pain level will decrease Outcome: Progressing   

## 2022-07-25 NOTE — Plan of Care (Signed)

## 2022-07-26 LAB — BASIC METABOLIC PANEL
Anion gap: 11 (ref 5–15)
BUN: 25 mg/dL — ABNORMAL HIGH (ref 8–23)
CO2: 26 mmol/L (ref 22–32)
Calcium: 9 mg/dL (ref 8.9–10.3)
Chloride: 99 mmol/L (ref 98–111)
Creatinine, Ser: 1.62 mg/dL — ABNORMAL HIGH (ref 0.44–1.00)
GFR, Estimated: 30 mL/min — ABNORMAL LOW (ref >60)
Glucose, Bld: 84 mg/dL (ref 70–99)
Potassium: 3.7 mmol/L (ref 3.5–5.1)
Sodium: 136 mmol/L (ref 135–145)

## 2022-07-26 LAB — CBC
HCT: 32 % — ABNORMAL LOW (ref 36.0–46.0)
Hemoglobin: 9.9 g/dL — ABNORMAL LOW (ref 12.0–15.0)
MCH: 29.1 pg (ref 26.0–34.0)
MCHC: 30.9 g/dL (ref 30.0–36.0)
MCV: 94.1 fL (ref 80.0–100.0)
Platelets: 311 10*3/uL (ref 150–400)
RBC: 3.4 MIL/uL — ABNORMAL LOW (ref 3.87–5.11)
RDW: 17.8 % — ABNORMAL HIGH (ref 11.5–15.5)
WBC: 6.4 10*3/uL (ref 4.0–10.5)
nRBC: 0 % (ref 0.0–0.2)

## 2022-07-26 LAB — GLUCOSE, CAPILLARY: Glucose-Capillary: 83 mg/dL (ref 70–99)

## 2022-07-26 NOTE — TOC Progression Note (Signed)
Transition of Care [facility]) - Progression Note    Patient Details  Name: [name] MRN: [id number] Date of Birth: 06/20/1929  Transition of Care [facility]) CM/SW [place], RN Phone Number: 07/26/2022, 1:31 PM  Clinical Narrative:     Tommi Rumps with [facility] reached out to this RNCM stating the patient's daughter, Parke Simmers, is refusing services when [facility] called to schedule physical therapy apt.   Expected Discharge Plan: [place] Barriers to Discharge: Barriers Resolved  Expected Discharge Plan and Services   Discharge Planning Services: CM Consult Post Acute Care Choice: Home Health, Durable Medical Equipment Living arrangements for the past 2 months: Single Family Home Expected Discharge Date: 07/25/22               DME Arranged: Wheelchair manual, Hospital bed, Other see comment (hoyer lift)         HH Arranged: PT, OT, Nurse's Aide HH Agency: [place] Date [place]: 07/24/22 Time [place]: 1329 Representative spoke with at [place]: [place] ([place]) Interventions [place]: No Food Insecurity (07/08/2022)  Housing: Low Risk  (07/08/2022)  Transportation Needs: No Transportation Needs (07/08/2022)  Utilities: Not At Risk (07/08/2022)  Tobacco Use: Medium Risk (07/17/2022)    Readmission Risk Interventions    07/25/2022    1:23 PM  Readmission Risk Prevention Plan  Transportation Screening Complete  Medication Review ([place]) Complete  PCP or Specialist appointment within 3-5 days of discharge Complete  HRI or [place] Complete  SW Recovery Care/Counseling Consult Complete  Palliative Care Screening Not [place] Patient Refused

## 2022-07-26 NOTE — Progress Notes (Signed)
She could not go home yesterday due to logistic issues. Briefly seen and examined Answering simple questions appropriately No major issues overnight per nursing staff Remains stable for discharge.

## 2022-07-26 NOTE — Progress Notes (Signed)
PTAR attempted to pick up patient last night at 2145 and daughter declined discharge.  PTAR called this am for transportation. Daughter is aware.

## 2022-07-27 DIAGNOSIS — M1030 Gout due to renal impairment, unspecified site: Secondary | ICD-10-CM | POA: Diagnosis not present

## 2022-07-27 DIAGNOSIS — J9811 Atelectasis: Secondary | ICD-10-CM | POA: Diagnosis not present

## 2022-07-27 DIAGNOSIS — D631 Anemia in chronic kidney disease: Secondary | ICD-10-CM | POA: Diagnosis not present

## 2022-07-27 DIAGNOSIS — B952 Enterococcus as the cause of diseases classified elsewhere: Secondary | ICD-10-CM | POA: Diagnosis not present

## 2022-07-27 DIAGNOSIS — F03911 Unspecified dementia, unspecified severity, with agitation: Secondary | ICD-10-CM | POA: Diagnosis not present

## 2022-07-27 DIAGNOSIS — N3080 Other cystitis without hematuria: Secondary | ICD-10-CM | POA: Diagnosis not present

## 2022-07-27 DIAGNOSIS — I5032 Chronic diastolic (congestive) heart failure: Secondary | ICD-10-CM | POA: Diagnosis not present

## 2022-07-27 DIAGNOSIS — E875 Hyperkalemia: Secondary | ICD-10-CM | POA: Diagnosis not present

## 2022-07-27 DIAGNOSIS — N179 Acute kidney failure, unspecified: Secondary | ICD-10-CM | POA: Diagnosis not present

## 2022-07-27 DIAGNOSIS — I130 Hypertensive heart and chronic kidney disease with heart failure and stage 1 through stage 4 chronic kidney disease, or unspecified chronic kidney disease: Secondary | ICD-10-CM | POA: Diagnosis not present

## 2022-07-27 DIAGNOSIS — M189 Osteoarthritis of first carpometacarpal joint, unspecified: Secondary | ICD-10-CM | POA: Diagnosis not present

## 2022-07-27 DIAGNOSIS — M8580 Other specified disorders of bone density and structure, unspecified site: Secondary | ICD-10-CM | POA: Diagnosis not present

## 2022-07-27 DIAGNOSIS — F0392 Unspecified dementia, unspecified severity, with psychotic disturbance: Secondary | ICD-10-CM | POA: Diagnosis not present

## 2022-07-27 DIAGNOSIS — F03918 Unspecified dementia, unspecified severity, with other behavioral disturbance: Secondary | ICD-10-CM | POA: Diagnosis not present

## 2022-07-27 DIAGNOSIS — N184 Chronic kidney disease, stage 4 (severe): Secondary | ICD-10-CM | POA: Diagnosis not present

## 2022-07-27 DIAGNOSIS — N139 Obstructive and reflux uropathy, unspecified: Secondary | ICD-10-CM | POA: Diagnosis not present

## 2022-07-27 DIAGNOSIS — S72401D Unspecified fracture of lower end of right femur, subsequent encounter for closed fracture with routine healing: Secondary | ICD-10-CM | POA: Diagnosis not present

## 2022-07-27 DIAGNOSIS — I700 Atherosclerosis of aorta: Secondary | ICD-10-CM | POA: Diagnosis not present

## 2022-07-27 DIAGNOSIS — E1122 Type 2 diabetes mellitus with diabetic chronic kidney disease: Secondary | ICD-10-CM | POA: Diagnosis not present

## 2022-07-27 DIAGNOSIS — M9701XD Periprosthetic fracture around internal prosthetic right hip joint, subsequent encounter: Secondary | ICD-10-CM | POA: Diagnosis not present

## 2022-07-27 DIAGNOSIS — J9601 Acute respiratory failure with hypoxia: Secondary | ICD-10-CM | POA: Diagnosis not present

## 2022-07-27 DIAGNOSIS — K219 Gastro-esophageal reflux disease without esophagitis: Secondary | ICD-10-CM | POA: Diagnosis not present

## 2022-07-27 DIAGNOSIS — E872 Acidosis, unspecified: Secondary | ICD-10-CM | POA: Diagnosis not present

## 2022-07-27 DIAGNOSIS — G9340 Encephalopathy, unspecified: Secondary | ICD-10-CM | POA: Diagnosis not present

## 2022-07-31 ENCOUNTER — Emergency Department (HOSPITAL_BASED_OUTPATIENT_CLINIC_OR_DEPARTMENT_OTHER): Payer: PPO

## 2022-07-31 ENCOUNTER — Encounter (HOSPITAL_BASED_OUTPATIENT_CLINIC_OR_DEPARTMENT_OTHER): Payer: Self-pay

## 2022-07-31 ENCOUNTER — Inpatient Hospital Stay (HOSPITAL_BASED_OUTPATIENT_CLINIC_OR_DEPARTMENT_OTHER)
Admission: EM | Admit: 2022-07-31 | Discharge: 2022-08-02 | DRG: 690 | Disposition: A | Discharge status: Home Health | Payer: PPO | Attending: Student | Admitting: Student

## 2022-07-31 ENCOUNTER — Other Ambulatory Visit: Payer: Self-pay

## 2022-07-31 ENCOUNTER — Encounter (HOSPITAL_COMMUNITY): Payer: Self-pay

## 2022-07-31 DIAGNOSIS — M1030 Gout due to renal impairment, unspecified site: Secondary | ICD-10-CM | POA: Diagnosis not present

## 2022-07-31 DIAGNOSIS — F03911 Unspecified dementia, unspecified severity, with agitation: Secondary | ICD-10-CM | POA: Diagnosis not present

## 2022-07-31 DIAGNOSIS — I700 Atherosclerosis of aorta: Secondary | ICD-10-CM | POA: Diagnosis not present

## 2022-07-31 DIAGNOSIS — G8929 Other chronic pain: Secondary | ICD-10-CM | POA: Diagnosis not present

## 2022-07-31 DIAGNOSIS — I6381 Other cerebral infarction due to occlusion or stenosis of small artery: Secondary | ICD-10-CM | POA: Diagnosis not present

## 2022-07-31 DIAGNOSIS — R8281 Pyuria: Secondary | ICD-10-CM | POA: Diagnosis not present

## 2022-07-31 DIAGNOSIS — N3080 Other cystitis without hematuria: Secondary | ICD-10-CM | POA: Diagnosis not present

## 2022-07-31 DIAGNOSIS — E669 Obesity, unspecified: Secondary | ICD-10-CM | POA: Diagnosis present

## 2022-07-31 DIAGNOSIS — I272 Pulmonary hypertension, unspecified: Secondary | ICD-10-CM | POA: Insufficient documentation

## 2022-07-31 DIAGNOSIS — J9811 Atelectasis: Secondary | ICD-10-CM | POA: Diagnosis not present

## 2022-07-31 DIAGNOSIS — I509 Heart failure, unspecified: Secondary | ICD-10-CM

## 2022-07-31 DIAGNOSIS — S72401D Unspecified fracture of lower end of right femur, subsequent encounter for closed fracture with routine healing: Secondary | ICD-10-CM | POA: Diagnosis not present

## 2022-07-31 DIAGNOSIS — G9340 Encephalopathy, unspecified: Secondary | ICD-10-CM | POA: Diagnosis not present

## 2022-07-31 DIAGNOSIS — T83511A Infection and inflammatory reaction due to indwelling urethral catheter, initial encounter: Secondary | ICD-10-CM

## 2022-07-31 DIAGNOSIS — M109 Gout, unspecified: Secondary | ICD-10-CM | POA: Diagnosis present

## 2022-07-31 DIAGNOSIS — S20219A Contusion of unspecified front wall of thorax, initial encounter: Secondary | ICD-10-CM | POA: Diagnosis not present

## 2022-07-31 DIAGNOSIS — R569 Unspecified convulsions: Secondary | ICD-10-CM | POA: Diagnosis not present

## 2022-07-31 DIAGNOSIS — F03918 Unspecified dementia, unspecified severity, with other behavioral disturbance: Secondary | ICD-10-CM | POA: Diagnosis not present

## 2022-07-31 DIAGNOSIS — R739 Hyperglycemia, unspecified: Secondary | ICD-10-CM | POA: Diagnosis not present

## 2022-07-31 DIAGNOSIS — X58XXXD Exposure to other specified factors, subsequent encounter: Secondary | ICD-10-CM | POA: Diagnosis present

## 2022-07-31 DIAGNOSIS — M8580 Other specified disorders of bone density and structure, unspecified site: Secondary | ICD-10-CM | POA: Diagnosis not present

## 2022-07-31 DIAGNOSIS — N281 Cyst of kidney, acquired: Secondary | ICD-10-CM | POA: Diagnosis not present

## 2022-07-31 DIAGNOSIS — E118 Type 2 diabetes mellitus with unspecified complications: Secondary | ICD-10-CM | POA: Diagnosis not present

## 2022-07-31 DIAGNOSIS — Z978 Presence of other specified devices: Secondary | ICD-10-CM | POA: Diagnosis not present

## 2022-07-31 DIAGNOSIS — I130 Hypertensive heart and chronic kidney disease with heart failure and stage 1 through stage 4 chronic kidney disease, or unspecified chronic kidney disease: Secondary | ICD-10-CM | POA: Diagnosis not present

## 2022-07-31 DIAGNOSIS — Z853 Personal history of malignant neoplasm of breast: Secondary | ICD-10-CM | POA: Diagnosis not present

## 2022-07-31 DIAGNOSIS — I10 Essential (primary) hypertension: Secondary | ICD-10-CM | POA: Diagnosis not present

## 2022-07-31 DIAGNOSIS — I5032 Chronic diastolic (congestive) heart failure: Secondary | ICD-10-CM | POA: Diagnosis present

## 2022-07-31 DIAGNOSIS — N390 Urinary tract infection, site not specified: Secondary | ICD-10-CM | POA: Diagnosis present

## 2022-07-31 DIAGNOSIS — S72491A Other fracture of lower end of right femur, initial encounter for closed fracture: Secondary | ICD-10-CM | POA: Diagnosis not present

## 2022-07-31 DIAGNOSIS — N179 Acute kidney failure, unspecified: Secondary | ICD-10-CM | POA: Diagnosis not present

## 2022-07-31 DIAGNOSIS — E1165 Type 2 diabetes mellitus with hyperglycemia: Secondary | ICD-10-CM | POA: Diagnosis not present

## 2022-07-31 DIAGNOSIS — M189 Osteoarthritis of first carpometacarpal joint, unspecified: Secondary | ICD-10-CM | POA: Diagnosis not present

## 2022-07-31 DIAGNOSIS — N184 Chronic kidney disease, stage 4 (severe): Secondary | ICD-10-CM | POA: Diagnosis not present

## 2022-07-31 DIAGNOSIS — N289 Disorder of kidney and ureter, unspecified: Secondary | ICD-10-CM | POA: Diagnosis not present

## 2022-07-31 DIAGNOSIS — Z87891 Personal history of nicotine dependence: Secondary | ICD-10-CM | POA: Diagnosis not present

## 2022-07-31 DIAGNOSIS — Z833 Family history of diabetes mellitus: Secondary | ICD-10-CM

## 2022-07-31 DIAGNOSIS — N1330 Unspecified hydronephrosis: Secondary | ICD-10-CM | POA: Diagnosis present

## 2022-07-31 DIAGNOSIS — D631 Anemia in chronic kidney disease: Secondary | ICD-10-CM | POA: Diagnosis present

## 2022-07-31 DIAGNOSIS — J9601 Acute respiratory failure with hypoxia: Secondary | ICD-10-CM | POA: Diagnosis not present

## 2022-07-31 DIAGNOSIS — E1122 Type 2 diabetes mellitus with diabetic chronic kidney disease: Secondary | ICD-10-CM | POA: Diagnosis present

## 2022-07-31 DIAGNOSIS — F0390 Unspecified dementia without behavioral disturbance: Secondary | ICD-10-CM | POA: Diagnosis present

## 2022-07-31 DIAGNOSIS — E872 Acidosis, unspecified: Secondary | ICD-10-CM | POA: Diagnosis not present

## 2022-07-31 DIAGNOSIS — B952 Enterococcus as the cause of diseases classified elsewhere: Secondary | ICD-10-CM | POA: Diagnosis not present

## 2022-07-31 DIAGNOSIS — N139 Obstructive and reflux uropathy, unspecified: Secondary | ICD-10-CM | POA: Diagnosis present

## 2022-07-31 DIAGNOSIS — R8271 Bacteriuria: Secondary | ICD-10-CM | POA: Diagnosis not present

## 2022-07-31 DIAGNOSIS — S72401A Unspecified fracture of lower end of right femur, initial encounter for closed fracture: Secondary | ICD-10-CM | POA: Diagnosis present

## 2022-07-31 DIAGNOSIS — M9701XD Periprosthetic fracture around internal prosthetic right hip joint, subsequent encounter: Secondary | ICD-10-CM | POA: Diagnosis not present

## 2022-07-31 DIAGNOSIS — G40909 Epilepsy, unspecified, not intractable, without status epilepticus: Secondary | ICD-10-CM | POA: Diagnosis not present

## 2022-07-31 DIAGNOSIS — Z6831 Body mass index (BMI) 31.0-31.9, adult: Secondary | ICD-10-CM

## 2022-07-31 DIAGNOSIS — E875 Hyperkalemia: Secondary | ICD-10-CM | POA: Diagnosis not present

## 2022-07-31 DIAGNOSIS — S82001A Unspecified fracture of right patella, initial encounter for closed fracture: Secondary | ICD-10-CM | POA: Diagnosis not present

## 2022-07-31 DIAGNOSIS — R4182 Altered mental status, unspecified: Secondary | ICD-10-CM | POA: Diagnosis not present

## 2022-07-31 DIAGNOSIS — K219 Gastro-esophageal reflux disease without esophagitis: Secondary | ICD-10-CM | POA: Diagnosis not present

## 2022-07-31 DIAGNOSIS — Z7401 Bed confinement status: Secondary | ICD-10-CM | POA: Diagnosis not present

## 2022-07-31 DIAGNOSIS — I959 Hypotension, unspecified: Secondary | ICD-10-CM | POA: Diagnosis not present

## 2022-07-31 DIAGNOSIS — F0392 Unspecified dementia, unspecified severity, with psychotic disturbance: Secondary | ICD-10-CM | POA: Diagnosis not present

## 2022-07-31 LAB — CBC WITH DIFFERENTIAL/PLATELET
Abs Immature Granulocytes: 0.02 10*3/uL (ref 0.00–0.07)
Basophils Absolute: 0 10*3/uL (ref 0.0–0.1)
Basophils Relative: 1 %
Eosinophils Absolute: 0.4 10*3/uL (ref 0.0–0.5)
Eosinophils Relative: 7 %
HCT: 32.7 % — ABNORMAL LOW (ref 36.0–46.0)
Hemoglobin: 10.4 g/dL — ABNORMAL LOW (ref 12.0–15.0)
Immature Granulocytes: 0 %
Lymphocytes Relative: 14 %
Lymphs Abs: 0.9 10*3/uL (ref 0.7–4.0)
MCH: 29.8 pg (ref 26.0–34.0)
MCHC: 31.8 g/dL (ref 30.0–36.0)
MCV: 93.7 fL (ref 80.0–100.0)
Monocytes Absolute: 0.6 10*3/uL (ref 0.1–1.0)
Monocytes Relative: 9 %
Neutro Abs: 4.6 10*3/uL (ref 1.7–7.7)
Neutrophils Relative %: 69 %
Platelets: 314 10*3/uL (ref 150–400)
RBC: 3.49 MIL/uL — ABNORMAL LOW (ref 3.87–5.11)
RDW: 18.4 % — ABNORMAL HIGH (ref 11.5–15.5)
WBC: 6.7 10*3/uL (ref 4.0–10.5)
nRBC: 0 % (ref 0.0–0.2)

## 2022-07-31 LAB — URINALYSIS, ROUTINE W REFLEX MICROSCOPIC
Bilirubin Urine: NEGATIVE
Glucose, UA: NEGATIVE mg/dL
Ketones, ur: NEGATIVE mg/dL
Nitrite: NEGATIVE
Protein, ur: 30 mg/dL — AB
Specific Gravity, Urine: 1.023 (ref 1.005–1.030)
WBC, UA: >50 WBC/hpf — ABNORMAL HIGH (ref 0–5)
pH: 5.5 (ref 5.0–8.0)

## 2022-07-31 LAB — COMPREHENSIVE METABOLIC PANEL
ALT: 9 U/L (ref 0–44)
AST: 23 U/L (ref 15–41)
Albumin: 3.3 g/dL — ABNORMAL LOW (ref 3.5–5.0)
Alkaline Phosphatase: 114 U/L (ref 38–126)
Anion gap: 12 (ref 5–15)
BUN: 56 mg/dL — ABNORMAL HIGH (ref 8–23)
CO2: 24 mmol/L (ref 22–32)
Calcium: 9.6 mg/dL (ref 8.9–10.3)
Chloride: 101 mmol/L (ref 98–111)
Creatinine, Ser: 2.76 mg/dL — ABNORMAL HIGH (ref 0.44–1.00)
GFR, Estimated: 16 mL/min — ABNORMAL LOW (ref >60)
Glucose, Bld: 156 mg/dL — ABNORMAL HIGH (ref 70–99)
Potassium: 4.3 mmol/L (ref 3.5–5.1)
Sodium: 137 mmol/L (ref 135–145)
Total Bilirubin: 0.3 mg/dL (ref 0.3–1.2)
Total Protein: 7.6 g/dL (ref 6.5–8.1)

## 2022-07-31 MED ORDER — CIPROFLOXACIN IN D5W 400 MG/200ML IV SOLN
400.0000 mg | Freq: Once | INTRAVENOUS | Status: AC
  Administered 2022-07-31: 400 mg via INTRAVENOUS
  Filled 2022-07-31: qty 200

## 2022-07-31 MED ORDER — GENTAMICIN SULFATE 40 MG/ML IJ SOLN: 1.5000 mg/kg | Freq: Once | INTRAVENOUS | Status: DC

## 2022-07-31 MED ORDER — VANCOMYCIN HCL IN DEXTROSE 1-5 GM/200ML-% IV SOLN
1000.0000 mg | Freq: Once | INTRAVENOUS | Status: AC
  Administered 2022-07-31: 1000 mg via INTRAVENOUS
  Filled 2022-07-31: qty 200

## 2022-07-31 MED ORDER — VANCOMYCIN HCL 500 MG IV SOLR
500.0000 mg | Freq: Once | INTRAVENOUS | Status: AC
  Administered 2022-07-31: 500 mg via INTRAVENOUS

## 2022-07-31 MED ORDER — ACETAMINOPHEN 500 MG PO TABS
1000.0000 mg | ORAL_TABLET | Freq: Once | ORAL | Status: AC
  Administered 2022-07-31: 1000 mg via ORAL
  Filled 2022-07-31: qty 2

## 2022-07-31 MED ORDER — VANCOMYCIN VARIABLE DOSE PER UNSTABLE RENAL FUNCTION (PHARMACIST DOSING)
Status: DC
  Filled 2022-07-31: qty 1

## 2022-07-31 MED ORDER — LACTATED RINGERS IV BOLUS
1000.0000 mL | Freq: Once | INTRAVENOUS | Status: AC
  Administered 2022-07-31: 1000 mL via INTRAVENOUS

## 2022-07-31 MED ORDER — SODIUM CHLORIDE 0.9 % IV SOLN: 1.0000 g | Freq: Once | INTRAVENOUS | Status: DC

## 2022-07-31 NOTE — ED Triage Notes (Signed)
Patient BIB [facility] from Home.  Endorses complicated Femur Fracture in November. Discharged to a SNF Facility for Therapy and discharged then in December. Discharged with Antibiotics for UTI and Symptoms somewhat improved but returned. Seen in ED a few weeks ago for Symptoms and discharged with further Antibiotic Therapy as well as a Indwelling Catheter.   Patient despite having a Foley Catheter, began to have Frequency and Discomfort as well as Constipation over the Past few days.   NAD Noted during Triage. BIB Wheelchair/Stretcher.

## 2022-07-31 NOTE — Progress Notes (Signed)
Report attempted to the ED.  [name]

## 2022-07-31 NOTE — ED Notes (Signed)
Report attempted to the floor RN.Marland Kitchen

## 2022-07-31 NOTE — Progress Notes (Addendum)
Plan of Care Note for accepted transfer   Patient: [name] MRN: [id number]   DOA: 07/31/2022  Facility requesting transfer: [facility]. Requesting Provider: Armandina Gemma, EDP.  Reason for transfer:  Complicated UTI and AKI.  Facility course: The patient is a 89 year old female with past medical history significant for dementia, type 2 diabetes, CKD 3B, seizure disorder, recent right distal femur fracture status post ORIF (07/03/2022), recently admitted for acute metabolic encephalopathy in the setting of AKI and emphysematous cystitis, discharged home 07/25/2022, who presented to [facility] from home via EMS with complaints of urinary frequency and urinary discomfort over the past few days.  The patient was discharged with oral ciprofloxacin, and an indwelling Foley catheter.  In the ED, lab studies were remarkable for elevated creatinine of 2.76 with GFR of 16 from baseline creatinine of 1.6 with GFR of 30.  Urinalysis was positive for pyuria in the setting of chronic indwelling Foley catheter use.  No leukocytosis.  Most recent urine culture (07/16/2022) grew: HAFNIA ALVEI Abnormal    Organism ID, Bacteria ENTEROCOCCUS FAECIUM Abnormal   With multidrug resistance.  Sensitive to ciprofloxacin.  The patient was started on IV vancomycin, IV ciprofloxacin 400 mg x 1.  Additionally she received IV fluid bolus LR 1000 cc/h x 1.  EDP requested admission for further management of her complicated UTI and AKI on CKD 3B.  The patient was accepted at [facility] telemetry medical unit as inpatient status.  Plan of care: The patient is accepted for admission to Telemetry unit, at [facility] as inpatient status.  Author: Kayleen Memos, DO 07/31/2022  Check [email or web address] for on-call coverage.  Nursing staff, Please call [place] number on Amion as soon as patient's arrival, so appropriate admitting provider can evaluate the pt.

## 2022-07-31 NOTE — ED Notes (Signed)
[facility] contacted about the vanc Pharm note and they stated it was fine to start the '1500mg'$  of vanc now.

## 2022-07-31 NOTE — H&P (Signed)
PCP:   Seward Carol, MD   Chief Complaint:  Sediment in Foley  HPI: This is a pleasant 89 year old female with past medical history of diabetes mellitus type 2, hypertension, dementia, history of seizure disorder, complex renal cyst [incidental finding last admission].  Patient was admitted here 12/2 to 12/13.  She was on vacation with her daughter in [place] when she suffered a mechanical fall.  She sustained a right distal femoral preprostatic fracture, acute anemia and on and off agitation/hallucination.  She was airlifted to [facility].  She underwent surgical repair by Dr. Mallie Mussel on 12/5 .  She was discharged to SNF for 12/13 to 12/18 for rehab.  She was transferred to inpatient at [facility] after patient became unresponsive at rehab.  She had been complaining of the need to urinate for 3 days prior.  At [facility] she had purulent urine that grew Enterococcus faecium and Hafnia Alvei.  She additionally had Hafnia Alvei bacteremia.  She was discussed with ID who recommended 10 days of Cipro.  She was discharged with an indwelling Foley as she had AKI thought to be due to obstructive uropathy in the setting of emphysematous cystitis that cause hydronephrosis.  Discharge instruction follow-up with urology in 7 days.  The daughter was concerned as patient was unable to obtain urology follow-up in 7 days.  Visiting nursing at had no Foley care instructions.  After 2 days at home, the daughter noted sediment collecting in the Foley.  She wanted a repeat UA, however home health was unable to obtain an order for this.  The daughter took her to [facility] ER today.  UA done today at [facility] was highly suggestive of ongoing UTI.  The patient has been maintained on Cipro now for total of 6 days.  The patient has been had no symptoms of infection.  There is no worsening confusion, no nausea or vomiting or fevers.  On discussion with patient's daughter who is present at bedside.  Repeat UA done at  [facility] was collected from the old Foley.  That Foley was placed in the ER on the 18th.  The Foley was subsequently changed prior to transfer to [facility].  He is provided by the patient's daughter who is present at bedside.  Review of Systems:  The patient denies anorexia, fever, weight loss,, vision loss, decreased hearing, hoarseness, chest pain, syncope, dyspnea on exertion, peripheral edema, balance deficits, hemoptysis, abdominal pain, melena, hematochezia, severe indigestion/heartburn, hematuria, incontinence, genital sores, muscle weakness, suspicious skin lesions, transient blindness, difficulty walking, depression, unusual weight change, abnormal bleeding, enlarged lymph nodes, angioedema, and breast masses.  Past Medical History: Past Medical History:  Diagnosis Date   Dementia ([place])    Diabetes mellitus    GERD (gastroesophageal reflux disease)    Glaucoma    Heart disease    Hypertension    Tinnitus    Past Surgical History:  Procedure Laterality Date   BREAST LUMPECTOMY     bil for breast ca   ORIF FEMUR FRACTURE Right 07/03/2022   Procedure: OPEN REDUCTION INTERNAL FIXATION (ORIF) DISTAL FEMUR FRACTURE;  Surgeon: Altamese [place], MD;  Location: [facility];  Service: Orthopedics;  Laterality: Right;    Medications: Prior to Admission medications   Medication Sig Start Date End Date Taking? Authorizing Provider  acetaminophen (TYLENOL) 500 MG tablet Take 2 tablets (1,000 mg total) by mouth every 8 (eight) hours as needed for mild pain or moderate pain. 07/25/22   Ghimire, Henreitta Leber, MD  allopurinol (  ZYLOPRIM) 100 MG tablet Take 100 mg by mouth 2 (two) times daily.    [provider]  amLODipine (NORVASC) 10 MG tablet Take 10 mg by mouth daily.    [provider]  blood glucose meter kit and supplies KIT Dispense based on patient and insurance preference. Use up to four times daily as directed. (FOR ICD-9 250.00, 250.01). For QAC - HS accuchecks. 11/27/20    Thurnell Lose, MD  ciprofloxacin (CIPRO) 500 MG tablet Take 1 tablet (500 mg total) by mouth daily with breakfast. 07/26/22   Ghimire, Henreitta Leber, MD  Continuous Blood Gluc Sensor (FREESTYLE LIBRE 2 SENSOR) MISC USE AS DIRECTED. REAPPLY EVERY 14 DAYS Patient taking differently: Inject 1 Device into the skin every 14 (fourteen) days. 11/20/21   Renato Shin, MD  gabapentin (NEURONTIN) 100 MG capsule Take 100 mg by mouth 2 (two) times daily.    [provider]  guaiFENesin (MUCINEX PO) Take 2 tablets by mouth daily.    [provider]  HYDRALAZINE HCL PO Take 1 tablet by mouth 3 (three) times daily.    [provider]  Insulin Syringe-Needle U-100 25G X 1" 1 ML MISC For 4 times a day insulin SQ, 1 month supply. Diagnosis E11.65 11/27/20   Thurnell Lose, MD  levETIRAcetam (KEPPRA) 500 MG tablet Take 500 mg by mouth in the morning.    [provider]  metoprolol tartrate (LOPRESSOR) 100 MG tablet Take 100 mg by mouth 2 (two) times daily. 08/03/20   [provider]  mirtazapine (REMERON) 15 MG tablet Take 15 mg by mouth at bedtime.     [provider]  Multiple Vitamins-Minerals (CENTRUM SILVER 50+WOMEN) TABS Take 1 tablet by mouth daily with breakfast.    [provider]  omeprazole (PRILOSEC) 20 MG capsule Take 20 mg by mouth every Monday, Wednesday, and Friday.    [provider]  oxyCODONE (OXY IR/ROXICODONE) 5 MG immediate release tablet Take 1 tablet (5 mg total) by mouth every 8 (eight) hours as needed for up to 7 days for breakthrough pain ((for MODERATE breakthrough pain)). 07/25/22 08/01/22  GhimireHenreitta Leber, MD    Allergies:   Allergies  Allergen Reactions   Ace Inhibitors Swelling, Rash and Cough   Prandin [Repaglinide] Other (See Comments)    Caused significant peripheral edema   Dilaudid [Hydromorphone Hcl] Itching and Other (See Comments)    Hallucinations (auditory and visual) also   Other Nausea And  Vomiting    "Tussionex Pennkinetic ER"   Latex Rash   Tape Rash    Prefers PAPER TAPE, PLEASE!!    Social History:  reports that she has quit smoking. She has never used smokeless tobacco. She reports that she does not drink alcohol and does not use drugs.  Family History: Family History  Problem Relation Age of Onset   Diabetes Sister     Physical Exam: Vitals:   07/31/22 1946 07/31/22 2000 07/31/22 2030 07/31/22 2227  BP:  (!) 144/46 125/83 (!) 142/82  Pulse:  75 74 71  Resp:  _0 Temp:    98.6 F (37 C)  TempSrc:    Oral  SpO2: 96% 100% 100% 100%  Weight:      Height:        General: Alert, pleasantly demented female well developed and nourished, no acute distress Eyes: PERRLA, pink conjunctiva, no scleral icterus ENT: Moist oral mucosa, neck supple, no thyromegaly Lungs: clear to ascultation, no wheeze, no  crackles, no use of accessory muscles Cardiovascular: regular rate and rhythm, no regurgitation, no gallops, no murmurs. No carotid bruits, no JVD Abdomen: soft, positive BS, non-tender, non-distended, no organomegaly, not an acute abdomen GU: not examined Neuro: CN II - XII grossly intact, sensation intact Musculoskeletal: strength 5/5 all extremities, no clubbing, cyanosis or edema Skin: no rash, no subcutaneous crepitation, no decubitus Psych: appropriate patient   Labs on Admission:  Recent Labs    07/31/22 1536  NA 137  K 4.3  CL 101  CO2 24  GLUCOSE 156*  BUN 56*  CREATININE 2.76*  CALCIUM 9.6   Recent Labs    07/31/22 1536  AST 23  ALT 9  ALKPHOS 114  BILITOT 0.3  PROT 7.6  ALBUMIN 3.3*    Recent Labs    07/31/22 1536  WBC 6.7  NEUTROABS 4.6  HGB 10.4*  HCT 32.7*  MCV 93.7  PLT 314     Radiological Exams on Admission: CT ABDOMEN PELVIS WO CONTRAST  Result Date: 07/31/2022 CLINICAL DATA:  Recurrent complicated UTI EXAM: CT ABDOMEN AND PELVIS WITHOUT CONTRAST TECHNIQUE: Multidetector CT imaging of the abdomen and pelvis  was performed following the standard protocol without IV contrast. RADIATION DOSE REDUCTION: This exam was performed according to the departmental dose-optimization program which includes automated exposure control, adjustment of the mA and/or kV according to patient size and/or use of iterative reconstruction technique. COMPARISON:  Renal ultrasound 07/23/2022. Prior noncontrast CT 07/17/2022 FINDINGS: Lower chest: Heart is enlarged. Prominent vascular calcifications are seen including along the coronary arteries. There is some linear opacity at the bases likely scar or atelectasis. Breathing motion. Hepatobiliary: Question slight nodular contour to the liver. Pancreas: Preserved pancreatic parenchyma. Spleen: No splenic enlargement. Adrenals/Urinary Tract: Stable adrenal glands. Once again there is a left-sided renal cystic focus with some marginal calcifications, unchanged from previous. No renal collecting system dilatation. Contracted urinary bladder with a Foley catheter. Mild stranding adjacent to the bladder is improving from the prior CT scan. Stomach/Bowel: The stomach is nondilated. Small hiatal hernia. On this non oral contrast exam large bowel has a normal course and caliber with scattered colonic stool. Still particularly along the rectum. Small bowel is nondilated. There are surgical changes along small bowel in the left mid mesentery, unchanged from previous. Vascular/Lymphatic: Diffuse vascular calcifications are seen. Normal caliber aorta and IVC. No developing abnormal lymph node enlargement seen in the abdomen and pelvis. Reproductive: Uterus is absent Other: Multiple presumed injection sites along the anterior pelvic and abdominal wall subcutaneous fat. Musculoskeletal: Multifocal degenerative changes along the spine with disc bulging and stenosis. Slightly heterogeneous appearance to the marrow as well particularly at L4 and of the pelvis with thickened trabecula. Please correlate for any known  history if needed additional workup. IMPRESSION: Foley catheter in the bladder which is decompressed. The previous wall thickening and stranding is improving from the prior CT. No proximal collecting system dilatation. Persistent complex cystic lesion of the left kidney with some marginal calcification. Please correlate for any prior workup for a postcontrast study may be useful when appropriate to exclude abnormal enhancement. Otherwise a follow-up study could be performed in 6 months. Persistent heterogeneous marrow particularly at L4 and in the pelvis. Please correlate for any known history or additional evaluation when appropriate Electronically Signed   By: Jill Side M.D.   On: 07/31/2022 18:16   CT Head Wo Contrast  Result Date: 07/31/2022 CLINICAL DATA:  Mental status change, unknown cause EXAM: CT HEAD WITHOUT CONTRAST  TECHNIQUE: Contiguous axial images were obtained from the base of the skull through the vertex without intravenous contrast. RADIATION DOSE REDUCTION: This exam was performed according to the departmental dose-optimization program which includes automated exposure control, adjustment of the mA and/or kV according to patient size and/or use of iterative reconstruction technique. COMPARISON:  Head CT 07/17/2022 FINDINGS: Brain: No intracranial hemorrhage, mass effect, or midline shift. Normal brain volume for age. No hydrocephalus. The basilar cisterns are patent. Similar periventricular chronic small vessel ischemia. Punctate remote lacunar infarcts in the basal ganglia. No evidence of territorial infarct or acute ischemia. No extra-axial or intracranial fluid collection. Vascular: Atherosclerosis of skullbase vasculature without hyperdense vessel or abnormal calcification. Skull: No fracture or focal lesion.  Frontal hyperostosis. Sinuses/Orbits: Occasional mucosal thickening of ethmoid air cells. No mastoid effusion bilateral lens resection. Other: None. IMPRESSION: 1. No acute  intracranial abnormality. 2. Mild chronic small vessel ischemia. Electronically Signed   By: Keith Rake M.D.   On: 07/31/2022 18:14   DG Chest Portable 1 View  Result Date: 07/31/2022 CLINICAL DATA:  Contusion. EXAM: PORTABLE CHEST 1 VIEW COMPARISON:  07/16/2022 x-ray and older FINDINGS: Tortuous and calcified aorta. Enlarged cardiopericardial silhouette. No edema, pneumothorax or effusion. No consolidation. Calcification of the tracheobronchial tree. Surgical clips in the left axillary region. Osteopenia and degenerative changes of the visualized skeleton. IMPRESSION: Stable enlarged cardiopericardial silhouette. Electronically Signed   By: Jill Side M.D.   On: 07/31/2022 16:45    Assessment/Plan Present on Admission:  Possible persisting complicated UTI (urinary tract infection) -Brought in for observation -Repeat UA ordered from new Foley.  UA collected at [place] [facility] may be contaminated as it was collected from existing Foley. -Continue p.o. Cipro  Obstructive uropathy -Defer to a.m. team voiding trial inpatient versus outpatient  Complex left renal cyst -Incidental finding-complex left renal cyst-repeat ultrasound 6 months  -Urology follow-up   Acute renal failure superimposed on stage 4 chronic kidney disease ([place]) -IV fluid hydration ordered for the next 10 hours -Repeat BMP in a.m.   Type 2 diabetes mellitus with complication, without long-term current use of insulin (HCC) -Sliding scale insulin ordered   Hypertension -Continue Norvasc and metoprolol  History of seizures -Continue Keppra  Chronic pain -Continue Neurontin.  Oxycodone IR has not been resumed  Gout -Stable, allopurinol resumed   Dementia ([place])   Jakwan Sally 07/31/2022, 11:37 PM

## 2022-07-31 NOTE — Progress Notes (Signed)
Pharmacy Antibiotic Note  [name] is a 89 y.o. female admitted on 07/31/2022 with bacteremia.  Pharmacy has been consulted for vancomycin dosing.  Plan: Vancomycin loading dose of '1500mg'$ , then dose based on levels / stabilization of renal function.  Follow culture data for de-escalation.  Monitor renal function for dose adjustments as indicated.  Follow culture data for de-escalation.  Monitor renal function for dose adjustments as indicated.    Height: '5\' 1"'$  (154.9 cm) Weight: 75.5 kg (166 lb 7.2 oz) IBW/kg (Calculated) : 47.8  Temp (24hrs), Avg:98.1 F (36.7 C), Min:98.1 F (36.7 C), Max:98.1 F (36.7 C)  Recent Labs  Lab 07/25/22 0042 07/26/22 0313 07/31/22 1536  WBC 5.0 6.4 6.7  CREATININE 1.57* 1.62* 2.76*    Estimated Creatinine Clearance: 12.3 mL/min (A) (by C-G formula based on SCr of 2.76 mg/dL (H)).    Allergies  Allergen Reactions   Ace Inhibitors Swelling, Rash and Cough   Prandin [Repaglinide] Other (See Comments)    Caused significant peripheral edema   Dilaudid [Hydromorphone Hcl] Itching and Other (See Comments)    Hallucinations (auditory and visual) also   Other Nausea And Vomiting    "Tussionex Pennkinetic ER"   Latex Rash   Tape Rash    Prefers PAPER TAPE, PLEASE!!    Thank you for allowing pharmacy to be a part of this patient's care.  Esmeralda Arthur, PharmD, BCCCP  07/31/2022 4:36 PM

## 2022-07-31 NOTE — ED Provider Triage Note (Signed)
Emergency Medicine Provider Triage Evaluation Note  RONIESHA HOLLINGSHEAD , a 89 y.o. female  was evaluated in triage.  Pt complains of suspected UTI. Has had UTIs treated in the past several months. Patient's caretaker noted cloudy urine on 07/28/2022 with increasing anxiety and disorientation around the time that urine became cloudy. Patient has also been complaining of abdominal pain during the time that symptoms began. Last urine and blood culture performed on 07/16/2022 showing positive for hafnia alvei and enterococcus faecium and was treated with antibiotics but concerned that infection has not resolved.  Also concerned about oxycodone allergy as she has been having some itchiness while taking medication.  Review of Systems  Positive: As above Negative: As above  Physical Exam  There were no vitals taken for this visit. Gen:   Awake, somewhat confused and somnolent Resp:  Shallow respirations, but no obvious wheezing or crackles MSK:   Unable to bear weight on right side due to prior femur fracture Other:  Complaining of frequent need to urinate even with catheter in  Medical Decision Making  Medically screening exam initiated at 3:06 PM.  Appropriate orders placed.  Norva Pavlov was informed that the remainder of the evaluation will be completed by another provider, this initial triage assessment does not replace that evaluation, and the importance of remaining in the ED until their evaluation is complete.     Luvenia Heller, PA-C 07/31/22 1516

## 2022-07-31 NOTE — ED Notes (Signed)
Cipro being held until Vanc is through.

## 2022-07-31 NOTE — ED Provider Notes (Signed)
[facility] Provider Note   CSN: [id number] Arrival date & time: 07/31/22  1501     History  Chief Complaint  Patient presents with   Dysuria    [name] is a 89 y.o. female.   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  The patient has also had some suprapubic discomfort and felt the need to void.  Been on ciprofloxacin for UTI that was multidrug-resistant per cultures in mid December.  She has an indwelling Foley catheter in place.  Her daughter states that she is not at her baseline mental status and has been declining over the past few days, she also states that she has been endorsing some chills.  No nausea or vomiting.  No chest pain or shortness of breath.  She has been constipated during that time as well.  Home Medications Prior to Admission medications   Medication Sig Start Date End Date Taking? Authorizing Provider  acetaminophen (TYLENOL) 500 MG tablet Take 2 tablets (1,000 mg total) by mouth every 8 (eight) hours as needed for mild pain or moderate pain. 07/25/22   Ghimire, Henreitta Leber, MD  allopurinol (ZYLOPRIM) 100 MG tablet Take 100 mg by mouth 2 (two) times daily.    [provider]  amLODipine (NORVASC) 10 MG tablet Take 10 mg by mouth daily.    [provider]  blood glucose meter kit and supplies KIT Dispense based on patient and insurance preference. Use up to four times daily as directed. (FOR ICD-9 250.00, 250.01). For QAC - HS accuchecks. 11/27/20   Thurnell Lose, MD  ciprofloxacin (CIPRO) 500 MG tablet Take 1 tablet (500 mg total) by  mouth daily with breakfast. 07/26/22   Ghimire, Henreitta Leber, MD  Continuous Blood Gluc Sensor (FREESTYLE LIBRE 2 SENSOR) MISC USE AS DIRECTED. REAPPLY EVERY 14 DAYS Patient taking differently: Inject 1 Device into the skin every 14 (fourteen) days. 11/20/21   Renato Shin, MD  gabapentin (NEURONTIN) 100 MG capsule Take 100 mg by mouth 2 (two) times daily.    [provider]  guaiFENesin (MUCINEX PO) Take 2 tablets by mouth daily.    [provider]  HYDRALAZINE HCL PO Take 1 tablet by mouth 3 (three) times daily.    [provider]  Insulin Syringe-Needle U-100 25G X 1" 1 ML MISC For 4 times a day insulin SQ, 1 month supply. Diagnosis E11.65 11/27/20   Thurnell Lose, MD  levETIRAcetam (KEPPRA) 500 MG tablet Take 500 mg by mouth in the morning.    [provider]  metoprolol tartrate (LOPRESSOR) 100 MG tablet Take 100 mg by mouth 2 (two) times daily. 08/03/20   [provider]  mirtazapine (REMERON) 15 MG tablet Take 15 mg by mouth at bedtime.     [provider]  Multiple Vitamins-Minerals (CENTRUM SILVER 50+WOMEN) TABS Take 1 tablet by mouth daily with breakfast.    [provider]  omeprazole (PRILOSEC) 20 MG capsule Take 20 mg by mouth every Monday, Wednesday, and Friday.    [provider]  oxyCODONE (OXY IR/ROXICODONE) 5 MG immediate release tablet Take 1 tablet (5 mg total) by mouth every 8 (  eight) hours as needed for up to 7 days for breakthrough pain ((for MODERATE breakthrough pain)). 07/25/22 08/01/22  GhimireHenreitta Leber, MD      Allergies    Ace inhibitors, Prandin [repaglinide], Dilaudid [hydromorphone hcl], Other, Latex, and Tape    Review of Systems   Review of Systems  Gastrointestinal:  Positive for abdominal pain and constipation.  Genitourinary:  Positive for dysuria and frequency.  All other systems reviewed and are negative.   Physical Exam Updated Vital Signs BP (!) 146/54   Pulse 78   Temp 98.1 F  (36.7 C) (Oral)   Resp 16   Ht _0  (1.549 m)   Wt 75.5 kg   SpO2 (!) 89%   BMI 31.45 kg/m  Physical Exam Vitals and nursing note reviewed.  Constitutional:      General: She is not in acute distress.    Appearance: She is well-developed.     Comments: GCS 14  HENT:     Head: Normocephalic and atraumatic.  Eyes:     Conjunctiva/sclera: Conjunctivae normal.  Cardiovascular:     Rate and Rhythm: Normal rate and regular rhythm.     Heart sounds: No murmur heard. Pulmonary:     Effort: Pulmonary effort is normal. No respiratory distress.     Breath sounds: Normal breath sounds.  Abdominal:     Palpations: Abdomen is soft.     Tenderness: There is abdominal tenderness in the suprapubic area.     Comments: Foley catheter in place, draining cloudy urine  Musculoskeletal:        General: No swelling.     Cervical back: Neck supple.  Skin:    General: Skin is warm and dry.     Capillary Refill: Capillary refill takes less than 2 seconds.  Neurological:     Mental Status: She is alert.     Comments: MENTAL STATUS EXAM:    Orientation: Alert  Memory: Cooperative, follows commands well.  Language: Speech is clear and language is normal.   CRANIAL NERVES:    CN 2 (Optic): Visual fields intact to confrontation.  CN 3,4,6 (EOM): Pupils equal and reactive to light. Full extraocular eye movement without nystagmus.  CN 5 (Trigeminal): Facial sensation is normal, no weakness of masticatory muscles.  CN 7 (Facial): No facial weakness or asymmetry.  CN 8 (Auditory): Auditory acuity grossly normal.  CN 9,10 (Glossophar): The uvula is midline, the palate elevates symmetrically.  CN 11 (spinal access): Normal sternocleidomastoid and trapezius strength.  CN 12 (Hypoglossal): The tongue is midline. No atrophy or fasciculations.Marland Kitchen   MOTOR:  Muscle Strength: 5/5RUE, 5/5LUE, 5/5RLE, 5/5LLE.   COORDINATION:   Intact finger-to-nose, no tremor.   SENSATION:   Intact to light touch all four  extremities.     Psychiatric:        Mood and Affect: Mood normal.     ED Results / Procedures / Treatments   Labs (all labs ordered are listed, but only abnormal results are displayed) Labs Reviewed  COMPREHENSIVE METABOLIC PANEL - Abnormal; Notable for the following components:      Result Value   Glucose, Bld 156 (*)    BUN 56 (*)    Creatinine, Ser 2.76 (*)    Albumin 3.3 (*)    GFR, Estimated 16 (*)    All other components within normal limits  CBC WITH DIFFERENTIAL/PLATELET - Abnormal; Notable for the following components:   RBC 3.49 (*)    Hemoglobin 10.4 (*)  HCT 32.7 (*)    RDW 18.4 (*)    All other components within normal limits  URINALYSIS, ROUTINE W REFLEX MICROSCOPIC - Abnormal; Notable for the following components:   APPearance CLOUDY (*)    Hgb urine dipstick TRACE (*)    Protein, ur 30 (*)    Leukocytes,Ua LARGE (*)    WBC, UA >50 (*)    Bacteria, UA RARE (*)    Non Squamous Epithelial 0-5 (*)    All other components within normal limits  URINE CULTURE    EKG None  Radiology CT ABDOMEN PELVIS WO CONTRAST  Result Date: 07/31/2022 CLINICAL DATA:  Recurrent complicated UTI EXAM: CT ABDOMEN AND PELVIS WITHOUT CONTRAST TECHNIQUE: Multidetector CT imaging of the abdomen and pelvis was performed following the standard protocol without IV contrast. RADIATION DOSE REDUCTION: This exam was performed according to the departmental dose-optimization program which includes automated exposure control, adjustment of the mA and/or kV according to patient size and/or use of iterative reconstruction technique. COMPARISON:  Renal ultrasound 07/23/2022. Prior noncontrast CT 07/17/2022 FINDINGS: Lower chest: Heart is enlarged. Prominent vascular calcifications are seen including along the coronary arteries. There is some linear opacity at the bases likely scar or atelectasis. Breathing motion. Hepatobiliary: Question slight nodular contour to the liver. Pancreas: Preserved  pancreatic parenchyma. Spleen: No splenic enlargement. Adrenals/Urinary Tract: Stable adrenal glands. Once again there is a left-sided renal cystic focus with some marginal calcifications, unchanged from previous. No renal collecting system dilatation. Contracted urinary bladder with a Foley catheter. Mild stranding adjacent to the bladder is improving from the prior CT scan. Stomach/Bowel: The stomach is nondilated. Small hiatal hernia. On this non oral contrast exam large bowel has a normal course and caliber with scattered colonic stool. Still particularly along the rectum. Small bowel is nondilated. There are surgical changes along small bowel in the left mid mesentery, unchanged from previous. Vascular/Lymphatic: Diffuse vascular calcifications are seen. Normal caliber aorta and IVC. No developing abnormal lymph node enlargement seen in the abdomen and pelvis. Reproductive: Uterus is absent Other: Multiple presumed injection sites along the anterior pelvic and abdominal wall subcutaneous fat. Musculoskeletal: Multifocal degenerative changes along the spine with disc bulging and stenosis. Slightly heterogeneous appearance to the marrow as well particularly at L4 and of the pelvis with thickened trabecula. Please correlate for any known history if needed additional workup. IMPRESSION: Foley catheter in the bladder which is decompressed. The previous wall thickening and stranding is improving from the prior CT. No proximal collecting system dilatation. Persistent complex cystic lesion of the left kidney with some marginal calcification. Please correlate for any prior workup for a postcontrast study may be useful when appropriate to exclude abnormal enhancement. Otherwise a follow-up study could be performed in 6 months. Persistent heterogeneous marrow particularly at L4 and in the pelvis. Please correlate for any known history or additional evaluation when appropriate Electronically Signed   By: Jill Side M.D.    On: 07/31/2022 18:16   CT Head Wo Contrast  Result Date: 07/31/2022 CLINICAL DATA:  Mental status change, unknown cause EXAM: CT HEAD WITHOUT CONTRAST TECHNIQUE: Contiguous axial images were obtained from the base of the skull through the vertex without intravenous contrast. RADIATION DOSE REDUCTION: This exam was performed according to the departmental dose-optimization program which includes automated exposure control, adjustment of the mA and/or kV according to patient size and/or use of iterative reconstruction technique. COMPARISON:  Head CT 07/17/2022 FINDINGS: Brain: No intracranial hemorrhage, mass effect, or midline shift. Normal brain  volume for age. No hydrocephalus. The basilar cisterns are patent. Similar periventricular chronic small vessel ischemia. Punctate remote lacunar infarcts in the basal ganglia. No evidence of territorial infarct or acute ischemia. No extra-axial or intracranial fluid collection. Vascular: Atherosclerosis of skullbase vasculature without hyperdense vessel or abnormal calcification. Skull: No fracture or focal lesion.  Frontal hyperostosis. Sinuses/Orbits: Occasional mucosal thickening of ethmoid air cells. No mastoid effusion bilateral lens resection. Other: None. IMPRESSION: 1. No acute intracranial abnormality. 2. Mild chronic small vessel ischemia. Electronically Signed   By: Keith Rake M.D.   On: 07/31/2022 18:14   DG Chest Portable 1 View  Result Date: 07/31/2022 CLINICAL DATA:  Contusion. EXAM: PORTABLE CHEST 1 VIEW COMPARISON:  07/16/2022 x-ray and older FINDINGS: Tortuous and calcified aorta. Enlarged cardiopericardial silhouette. No edema, pneumothorax or effusion. No consolidation. Calcification of the tracheobronchial tree. Surgical clips in the left axillary region. Osteopenia and degenerative changes of the visualized skeleton. IMPRESSION: Stable enlarged cardiopericardial silhouette. Electronically Signed   By: Jill Side M.D.   On: 07/31/2022  16:45    Procedures Procedures    Medications Ordered in ED Medications  ciprofloxacin (CIPRO) IVPB 400 mg (400 mg Intravenous New Bag/Given 07/31/22 1843)  vancomycin variable dose per unstable renal function (pharmacist dosing) (has no administration in time range)  lactated ringers bolus 1,000 mL (0 mLs Intravenous Stopped 07/31/22 1829)  vancomycin (VANCOCIN) IVPB 1000 mg/200 mL premix (0 mg Intravenous Stopped 07/31/22 1829)    And  vancomycin (VANCOCIN) 500 mg in sodium chloride 0.9 % 100 mL IVPB (0 mg Intravenous Stopped 07/31/22 1829)    ED Course/ Medical Decision Making/ A&P                           Medical Decision Making Amount and/or Complexity of Data Reviewed Radiology: ordered.  Risk Prescription drug management. Decision regarding hospitalization.    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  The patient has also had some suprapubic discomfort and felt the need to void.  Been on ciprofloxacin for UTI that was multidrug-resistant per cultures in mid December.  She has an indwelling Foley catheter in place.  Her daughter states that she is not at her baseline mental status and has been declining over the past few days, she also states that she has been endorsing some chills.  No nausea or vomiting.  No chest pain or shortness of breath.  She has been constipated during that time as well.  On arrival, the patient was afebrile, hemodynamically stable, pulse 64, RR 20, BP 130/54, saturating 93 to 96% on room air.  Sinus rhythm noted on cardiac telemetry.  Physical exam concerning for a Foley catheter in place draining very cloudy urine, suprapubic  tenderness to palpation.  Concern for persistent urinary tract infection that is previously been documented as multidrug-resistant on urine cultures.  Sensitivities to vancomycin and ciprofloxacin noted on culture reports.  Vancomycin and Cipro IV ordered.  The patient had been on outpatient oral Cipro.  Her Foley catheter was exchanged in the emergency department.  Urinalysis positive for large leukocytes, greater than 50 WBCs and rare bacteria concerning for ongoing UTI, CBC without a leukocytosis, stable anemia to 10.4,  CMP with worsening AKI on CKD with a creatinine of 2.76, baseline around 1.5.  Given the patient's history of recent hydronephrosis, emphysematous cystitis, I discussed admission for observation given her declining renal function.  Ultrasound not available at this time, will obtain CT abdomen pelvis without contrast in addition to CT head given the patient's mental status decline.  CT Head: IMPRESSION:  1. No acute intracranial abnormality.  2. Mild chronic small vessel ischemia.   CT Abdomen Pelvis: IMPRESSION:  Foley catheter in the bladder which is decompressed. The previous  wall thickening and stranding is improving from the prior CT. No  proximal collecting system dilatation.    Persistent complex cystic lesion of the left kidney with some  marginal calcification. Please correlate for any prior workup for a  postcontrast study may be useful when appropriate to exclude  abnormal enhancement. Otherwise a follow-up study could be performed  in 6 months.    Persistent heterogeneous marrow particularly at L4 and in the  pelvis. Please correlate for any known history or additional  evaluation when appropriate   CXR: IMPRESSION:  Stable enlarged cardiopericardial silhouette.   CT does not show any acute abnormality.  Patient with AKI on CKD and persistent UTI with Foley catheter in place.  Administered Vanco Hyson and IV Cipro based on prior antibiotic sensitivities,  urine culture in process.  She was administered a 1 L LR bolus.  Dr. Nevada Crane of hospitalist medicine was consulted for admission and ultimately accepted the patient in admission for further monitoring of the patient's renal function in the setting of her acute illness.  Final Clinical Impression(s) / ED Diagnoses Final diagnoses:  AKI (acute kidney injury) ([place])  Urinary tract infection associated with indwelling urethral catheter, initial encounter ([place])  Foley catheter in place    Rx / DC Orders ED Discharge Orders     None         Regan Lemming, MD 07/31/22 1932

## 2022-08-01 ENCOUNTER — Inpatient Hospital Stay (HOSPITAL_COMMUNITY): Payer: PPO

## 2022-08-01 DIAGNOSIS — R8281 Pyuria: Secondary | ICD-10-CM

## 2022-08-01 DIAGNOSIS — N184 Chronic kidney disease, stage 4 (severe): Secondary | ICD-10-CM

## 2022-08-01 DIAGNOSIS — R8271 Bacteriuria: Secondary | ICD-10-CM | POA: Insufficient documentation

## 2022-08-01 DIAGNOSIS — E118 Type 2 diabetes mellitus with unspecified complications: Secondary | ICD-10-CM | POA: Diagnosis not present

## 2022-08-01 DIAGNOSIS — F0390 Unspecified dementia without behavioral disturbance: Secondary | ICD-10-CM | POA: Diagnosis not present

## 2022-08-01 DIAGNOSIS — S72401D Unspecified fracture of lower end of right femur, subsequent encounter for closed fracture with routine healing: Secondary | ICD-10-CM

## 2022-08-01 DIAGNOSIS — I272 Pulmonary hypertension, unspecified: Secondary | ICD-10-CM | POA: Insufficient documentation

## 2022-08-01 DIAGNOSIS — R739 Hyperglycemia, unspecified: Secondary | ICD-10-CM

## 2022-08-01 DIAGNOSIS — N390 Urinary tract infection, site not specified: Secondary | ICD-10-CM | POA: Diagnosis not present

## 2022-08-01 DIAGNOSIS — N179 Acute kidney failure, unspecified: Secondary | ICD-10-CM | POA: Diagnosis not present

## 2022-08-01 LAB — GLUCOSE, CAPILLARY
Glucose-Capillary: 103 mg/dL — ABNORMAL HIGH (ref 70–99)
Glucose-Capillary: 98 mg/dL (ref 70–99)
Glucose-Capillary: 98 mg/dL (ref 70–99)
Glucose-Capillary: 119 mg/dL — ABNORMAL HIGH (ref 70–99)

## 2022-08-01 LAB — CBC WITH DIFFERENTIAL/PLATELET
Abs Immature Granulocytes: 0.01 10*3/uL (ref 0.00–0.07)
Basophils Absolute: 0 10*3/uL (ref 0.0–0.1)
Basophils Relative: 1 %
Eosinophils Absolute: 0.4 10*3/uL (ref 0.0–0.5)
Eosinophils Relative: 6 %
HCT: 29.4 % — ABNORMAL LOW (ref 36.0–46.0)
Hemoglobin: 9.2 g/dL — ABNORMAL LOW (ref 12.0–15.0)
Immature Granulocytes: 0 %
Lymphocytes Relative: 14 %
Lymphs Abs: 0.8 10*3/uL (ref 0.7–4.0)
MCH: 29.3 pg (ref 26.0–34.0)
MCHC: 31.3 g/dL (ref 30.0–36.0)
MCV: 93.6 fL (ref 80.0–100.0)
Monocytes Absolute: 0.6 10*3/uL (ref 0.1–1.0)
Monocytes Relative: 11 %
Neutro Abs: 3.9 10*3/uL (ref 1.7–7.7)
Neutrophils Relative %: 68 %
Platelets: 263 10*3/uL (ref 150–400)
RBC: 3.14 MIL/uL — ABNORMAL LOW (ref 3.87–5.11)
RDW: 18.1 % — ABNORMAL HIGH (ref 11.5–15.5)
WBC: 5.7 10*3/uL (ref 4.0–10.5)
nRBC: 0 % (ref 0.0–0.2)

## 2022-08-01 LAB — URINALYSIS, ROUTINE W REFLEX MICROSCOPIC
Bilirubin Urine: NEGATIVE
Glucose, UA: NEGATIVE mg/dL
Hgb urine dipstick: NEGATIVE
Ketones, ur: NEGATIVE mg/dL
Nitrite: NEGATIVE
Protein, ur: 100 mg/dL — AB
Specific Gravity, Urine: 1.016 (ref 1.005–1.030)
WBC, UA: >50 WBC/hpf — ABNORMAL HIGH (ref 0–5)
pH: 5 (ref 5.0–8.0)

## 2022-08-01 LAB — BASIC METABOLIC PANEL
Anion gap: 12 (ref 5–15)
BUN: 50 mg/dL — ABNORMAL HIGH (ref 8–23)
CO2: 22 mmol/L (ref 22–32)
Calcium: 9.1 mg/dL (ref 8.9–10.3)
Chloride: 103 mmol/L (ref 98–111)
Creatinine, Ser: 2.46 mg/dL — ABNORMAL HIGH (ref 0.44–1.00)
GFR, Estimated: 18 mL/min — ABNORMAL LOW (ref >60)
Glucose, Bld: 99 mg/dL (ref 70–99)
Potassium: 3.7 mmol/L (ref 3.5–5.1)
Sodium: 137 mmol/L (ref 135–145)

## 2022-08-01 MED ORDER — ONDANSETRON HCL 4 MG/2ML IJ SOLN: 4.0000 mg | Freq: Four times a day (QID) | INTRAMUSCULAR | Status: DC | PRN

## 2022-08-01 MED ORDER — ONDANSETRON HCL 4 MG PO TABS: 4.0000 mg | ORAL_TABLET | Freq: Four times a day (QID) | ORAL | Status: DC | PRN

## 2022-08-01 MED ORDER — INSULIN ASPART 100 UNIT/ML IJ SOLN: 0.0000 [IU] | Freq: Three times a day (TID) | INTRAMUSCULAR | Status: DC

## 2022-08-01 MED ORDER — LORATADINE 10 MG PO TABS
10.0000 mg | ORAL_TABLET | Freq: Every day | ORAL | Status: DC
  Administered 2022-08-01 – 2022-08-02 (×2): 10 mg via ORAL
  Filled 2022-08-01 (×2): qty 1

## 2022-08-01 MED ORDER — LEVETIRACETAM 500 MG PO TABS
500.0000 mg | ORAL_TABLET | Freq: Every day | ORAL | Status: DC
  Administered 2022-08-01 – 2022-08-02 (×2): 500 mg via ORAL
  Filled 2022-08-01 (×2): qty 1

## 2022-08-01 MED ORDER — SENNOSIDES-DOCUSATE SODIUM 8.6-50 MG PO TABS: 1.0000 | ORAL_TABLET | Freq: Every evening | ORAL | Status: DC | PRN

## 2022-08-01 MED ORDER — POLYETHYLENE GLYCOL 3350 17 G PO PACK
17.0000 g | PACK | Freq: Two times a day (BID) | ORAL | Status: DC | PRN
  Filled 2022-08-01 (×2): qty 1

## 2022-08-01 MED ORDER — ACETAMINOPHEN 325 MG PO TABS
650.0000 mg | ORAL_TABLET | Freq: Four times a day (QID) | ORAL | Status: DC | PRN
  Administered 2022-08-01 – 2022-08-02 (×5): 650 mg via ORAL
  Filled 2022-08-01 (×6): qty 2

## 2022-08-01 MED ORDER — METOPROLOL TARTRATE 100 MG PO TABS
100.0000 mg | ORAL_TABLET | Freq: Two times a day (BID) | ORAL | Status: DC
  Administered 2022-08-01 – 2022-08-02 (×3): 100 mg via ORAL
  Filled 2022-08-01 (×4): qty 1

## 2022-08-01 MED ORDER — HEPARIN SODIUM (PORCINE) 5000 UNIT/ML IJ SOLN
5000.0000 [IU] | Freq: Three times a day (TID) | INTRAMUSCULAR | Status: DC
  Administered 2022-08-01 – 2022-08-02 (×4): 5000 [IU] via SUBCUTANEOUS
  Filled 2022-08-01 (×5): qty 1

## 2022-08-01 MED ORDER — SODIUM CHLORIDE 0.9 % IV SOLN: INTRAVENOUS | Status: AC

## 2022-08-01 MED ORDER — AMLODIPINE BESYLATE 10 MG PO TABS
10.0000 mg | ORAL_TABLET | Freq: Every day | ORAL | Status: DC
  Administered 2022-08-01 – 2022-08-02 (×2): 10 mg via ORAL
  Filled 2022-08-01 (×2): qty 1

## 2022-08-01 MED ORDER — GABAPENTIN 100 MG PO CAPS
100.0000 mg | ORAL_CAPSULE | Freq: Two times a day (BID) | ORAL | Status: DC
  Administered 2022-08-01 – 2022-08-02 (×3): 100 mg via ORAL
  Filled 2022-08-01 (×4): qty 1

## 2022-08-01 MED ORDER — CIPROFLOXACIN HCL 500 MG PO TABS
500.0000 mg | ORAL_TABLET | Freq: Every day | ORAL | Status: DC
  Administered 2022-08-01 – 2022-08-02 (×2): 500 mg via ORAL
  Filled 2022-08-01 (×2): qty 1

## 2022-08-01 MED ORDER — HYDROCERIN EX CREA
TOPICAL_CREAM | Freq: Two times a day (BID) | CUTANEOUS | Status: DC
  Filled 2022-08-01: qty 113

## 2022-08-01 MED ORDER — MELATONIN 3 MG PO TABS
6.0000 mg | ORAL_TABLET | Freq: Every evening | ORAL | Status: DC | PRN
  Administered 2022-08-01: 6 mg via ORAL
  Filled 2022-08-01 (×2): qty 2

## 2022-08-01 MED ORDER — ALLOPURINOL 100 MG PO TABS
100.0000 mg | ORAL_TABLET | Freq: Two times a day (BID) | ORAL | Status: DC
  Administered 2022-08-01 – 2022-08-02 (×3): 100 mg via ORAL
  Filled 2022-08-01 (×4): qty 1

## 2022-08-01 MED ORDER — POLYETHYLENE GLYCOL 3350 17 G PO PACK
17.0000 g | PACK | Freq: Two times a day (BID) | ORAL | Status: AC
  Administered 2022-08-01 (×2): 17 g via ORAL
  Filled 2022-08-01: qty 1

## 2022-08-01 MED ORDER — SENNOSIDES-DOCUSATE SODIUM 8.6-50 MG PO TABS
1.0000 | ORAL_TABLET | Freq: Two times a day (BID) | ORAL | Status: AC
  Administered 2022-08-01 (×2): 1 via ORAL
  Filled 2022-08-01 (×2): qty 1

## 2022-08-01 MED ORDER — INSULIN ASPART 100 UNIT/ML IJ SOLN: 0.0000 [IU] | Freq: Every day | INTRAMUSCULAR | Status: DC

## 2022-08-01 MED ORDER — SENNOSIDES-DOCUSATE SODIUM 8.6-50 MG PO TABS
1.0000 | ORAL_TABLET | Freq: Two times a day (BID) | ORAL | Status: DC | PRN
  Filled 2022-08-01 (×2): qty 1

## 2022-08-01 MED ORDER — ACETAMINOPHEN 650 MG RE SUPP: 650.0000 mg | Freq: Four times a day (QID) | RECTAL | Status: DC | PRN

## 2022-08-01 NOTE — Plan of Care (Signed)
  Problem: Education: Goal: Verbalization of understanding the information provided (i.e., activity precautions, restrictions, etc) will improve Outcome: Progressing   Problem: Pain Management: Goal: Pain level will decrease Outcome: Progressing   Problem: Coping: Goal: Level of anxiety will decrease Outcome: Progressing   Problem: Safety: Goal: Ability to remain free from injury will improve Outcome: Progressing

## 2022-08-01 NOTE — Evaluation (Signed)
Physical Therapy Evaluation Patient Details Name: [name] MRN: [id number] DOB: 06/14/1929 Today's Date: 08/01/2022  History of Present Illness  89 y.o. female presents to [facility] hospital on 07/31/2022 with concern for persistent UTI. Pt was recently admitted 12/2-12/13 after fall with R femoral fx, subsequently discharge to SNF. Pt returned to [facility] from SNF on 12/18 due to becoming unresponsive, found to have UTI. PMH includes dementia, DM, GERD, glaucoma, heart disease, HTN.  Clinical Impression  Pt presents to PT with deficits in cognition, functional mobility, balance, strength, power. Pt has been transferring with use of a hoyer lift since fall and femur fx in December. Pt is able to perform a functional transfer with PT this session, needing significant assistance to squat and pivot from bed to recliner. Pt's daughter provides support to maintain RLE NWB by elevating LE during transfer, as the pt has poor recall 2/2 cognitive deficits. PT recommends the pt transfer from bed to recliner multiple times daily, utilizing hoyer lift with nursing staff. PT recommends discharge home with HHPT and continued support of family.       Recommendations for follow up therapy are one component of a multi-disciplinary discharge planning process, led by the attending physician.  Recommendations may be updated based on patient status, additional functional criteria and insurance authorization.  Follow Up Recommendations Home health PT Can patient physically be transported by private vehicle: No    Assistance Recommended at Discharge Frequent or constant Supervision/Assistance  Patient can return home with the following  Two people to help with walking and/or transfers;Two people to help with bathing/dressing/bathroom;Assistance with cooking/housework;Direct supervision/assist for medications management;Direct supervision/assist for financial management;Assist for transportation;Help with stairs or ramp for  entrance    Equipment Recommendations  (has necessary DME)  Recommendations for Other Services       Functional Status Assessment Patient has had a recent decline in their functional status and demonstrates the ability to make significant improvements in function in a reasonable and predictable amount of time.     Precautions / Restrictions Precautions Precautions: Fall Restrictions Weight Bearing Restrictions: Yes RLE Weight Bearing: Non weight bearing      Mobility  Bed Mobility Overal bed mobility: Needs Assistance Bed Mobility: Supine to Sit     Supine to sit: Min guard, HOB elevated     General bed mobility comments: increased time    Transfers Overall transfer level: Needs assistance Equipment used: 1 person hand held assist Transfers: Bed to chair/wheelchair/BSC       Squat pivot transfers: Max assist, +2 physical assistance (daughter assisting in elevating RLE to maintain NWB)          Ambulation/Gait                  Stairs            Wheelchair Mobility    Modified Rankin (Stroke Patients Only)       Balance Overall balance assessment: Needs assistance Sitting-balance support: No upper extremity supported, Feet supported Sitting balance-Leahy Scale: Fair                                       Pertinent Vitals/Pain Pain Assessment Pain Assessment: Faces Faces Pain Scale: Hurts little more Pain Location: RLE Pain Descriptors / Indicators: Grimacing Pain Intervention(s): Monitored during session    Home Living Family/patient expects to be discharged to:: Private residence Living Arrangements: Children Available  Help at Discharge: Family Type of Home: House Home Access: Stairs to enter Entrance Stairs-Rails: Left Entrance Stairs-Number of Steps: 5   Home Layout: Two level;Able to live on main level with bedroom/bathroom Home Equipment: Rollator (4 wheels);Cane - single point;Shower seat;BSC/3in1       Prior Function Prior Level of Function : Needs assist;History of Falls (last six months)             Mobility Comments: pt was ambulatory with SPC vs rollator prior to femur fx in december, has been utilizing a hoyer lift for transfers at home since ADLs Comments: Receives assistance with bathing. Able to dress herself. (Information submitted prior to this recent admission provided above.) Since last d/c pt was working with SNF with NWB RLE status.)     Hand Dominance   Dominant Hand: Right    Extremity/Trunk Assessment   Upper Extremity Assessment Upper Extremity Assessment: Generalized weakness    Lower Extremity Assessment Lower Extremity Assessment: Generalized weakness    Cervical / Trunk Assessment Cervical / Trunk Assessment: Kyphotic  Communication   Communication: HOH  Cognition Arousal/Alertness: Awake/alert Behavior During Therapy: Anxious, Impulsive Overall Cognitive Status: History of cognitive impairments - at baseline Area of Impairment: Orientation, Attention, Following commands, Memory, Safety/judgement, Awareness, Problem solving                 Orientation Level: Disoriented to, Time, Situation Current Attention Level: Sustained Memory: Decreased recall of precautions, Decreased short-term memory Following Commands: Follows one step commands consistently Safety/Judgement: Decreased awareness of safety, Decreased awareness of deficits Awareness: Intellectual Problem Solving: Difficulty sequencing, Requires verbal cues General Comments: hx of dementia        General Comments General comments (skin integrity, edema, etc.): VSS on RA    Exercises     Assessment/Plan    PT Assessment Patient needs continued PT services (hoyer lift with nursing staff)  PT Problem List Decreased strength;Cardiopulmonary status limiting activity;Pain;Decreased range of motion;Decreased balance;Decreased activity tolerance;Decreased safety awareness;Decreased  knowledge of use of DME;Decreased mobility;Decreased knowledge of precautions;Decreased cognition       PT Treatment Interventions DME instruction;Functional mobility training;Therapeutic activities;Therapeutic exercise;Balance training;Neuromuscular re-education;Patient/family education;Wheelchair mobility training;Cognitive remediation    PT Goals (Current goals can be found in the Care Plan section)  Acute Rehab PT Goals Patient Stated Goal: to improve functional mobility quality, reduce caregiver burden PT Goal Formulation: With patient/family Time For Goal Achievement: 08/15/22 Potential to Achieve Goals: Fair Additional Goals Additional Goal #1: Pt will mobilize in a manual wheelchair for >50' with minA to demonstrate improved ability to mobilize around the home    Frequency Min 3X/week     Co-evaluation               AM-PAC PT "6 Clicks" Mobility  Outcome Measure Help needed turning from your back to your side while in a flat bed without using bedrails?: A Little Help needed moving from lying on your back to sitting on the side of a flat bed without using bedrails?: A Little Help needed moving to and from a bed to a chair (including a wheelchair)?: A Lot Help needed standing up from a chair using your arms (e.g., wheelchair or bedside chair)?: Total Help needed to walk in hospital room?: Total Help needed climbing 3-5 steps with a railing? : Total 6 Click Score: 11    End of Session   Activity Tolerance: Patient tolerated treatment well Patient left: in chair;with call bell/phone within reach;with chair alarm set;with family/visitor present Nurse  Communication: Mobility status;Need for lift equipment PT Visit Diagnosis: Muscle weakness (generalized) (M62.81);Pain;Other abnormalities of gait and mobility (R26.89);Difficulty in walking, not elsewhere classified (R26.2) Pain - Right/Left: Right Pain - part of body: Leg    Time: 1645-1720 PT Time Calculation (min)  (ACUTE ONLY): 35 min   Charges:   PT Evaluation $PT Eval Low Complexity: [place], PT, DPT Acute Rehabilitation Office [phone]   Zenaida Niece 08/01/2022, 5:30 PM

## 2022-08-01 NOTE — Progress Notes (Signed)
PROGRESS NOTE  RAYME BUI BJY:[id number] DOB: 03/09/1929   PCP: Seward Carol, MD  Patient is from: Home.  Has HH PT/OT, wheelchair, hospital bed and Hoyer lift  DOA: 07/31/2022 LOS: 1  Chief complaints Chief Complaint  Patient presents with   Dysuria     Brief Narrative / Interim history: 89 year old F with PMH of dementia, DM-2, CKD stage IV, seizure disorder, right distal femoral fracture-s/p ORIF on 12/5 and recent hospitalization from 12/18-27 for bacteremia, cystitis and AKI with obstructive uropathy presenting with "sediment in urine".  Patient was discharged home with Foley catheter on p.o. Cipro last hospitalization.  In ED, stable vitals.   Foley catheter changed.  Repeat UA from new Foley catheter with bacteria and pyuria.  Cr 2.76 (from 1.6).  Patient was continued on Cipro admitted for AKI and possible UTI.  Subjective: Seen and examined earlier this morning.  No major events overnight of this morning.  Family friend at bedside and helped with history.  Patient has no complaints other than some itching in her legs.  She denies chest pain, dyspnea, nausea, vomiting, fever, abdominal pain.  Family member concerned about constipation.   Objective: Vitals:   07/31/22 2030 07/31/22 2227 08/01/22 0509 08/01/22 0734  BP: 125/83 (!) 142/82 (!) 140/46 (!) 145/48  Pulse: 74 71 65 66  Resp: '16 19 16 16  '$ Temp:  98.6 F (37 C) 98.5 F (36.9 C) 98.2 F (36.8 C)  TempSrc:  Oral Oral Oral  SpO2: 100% 100% 97% 96%  Weight:      Height:        Examination:  GENERAL: No apparent distress.  Nontoxic. HEENT: MMM.  Vision and hearing grossly intact.  NECK: Supple.  No apparent JVD.  RESP:  No IWOB.  Fair aeration bilaterally. CVS:  RRR. Heart sounds normal.  ABD/GI/GU: BS+. Abd soft, NTND.  MSK/EXT:  Moves extremities. No apparent deformity. No edema.  SKIN: Surgical wound on RLE healed. NEURO: Awake, alert and oriented to self and place.  No apparent focal neuro  deficit. PSYCH: Calm. Normal affect.   Procedures:  None  Microbiology summarized: Urine culture pending.  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  Hemodynamically stable.  Not on nephrotoxic meds.  AKI improving. Recent Labs    07/19/22 0620 07/20/22 0423 07/21/22 0216 07/22/22 0330 07/23/22 0239 07/24/22 0644 07/25/22 0042 07/26/22 0313 07/31/22 1536 08/01/22 0220  BUN 85* 81* 68* 46* 40* 30* 27* 25* 56* 50*  CREATININE 4.09* 3.24* 2.56* 1.90* 1.66* 1.54* 1.57* 1.62* 2.76* 2.46*  -Continue monitoring -Repeat renal ultrasound -Continue indwelling Foley catheter  Pyuria/bacteriuria: Patient with chronic Foley.  No UTI symptoms.  Family noted some sediment in Foley catheter.  Foley exchanged in ED. patient had polymicrobial UTI with Enterococcus Faecium and Hafina Alvei, and Hafina Alvei bacteremia last hospitalization.  She was discharged on p.o. Cipro for 8 more days on 12/28. -Continue p.o. 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  Per family, patient is nonweightbearing after right femoral fracture.  -Check renal ultrasound -Continue following catheter until  she is weightbearing.  She has an appointment with orthopedic surgeon on 01/17.  Chronic diastolic CHF/severe PAH: TTE in 02/2022 with LVEF of 60 to 65%, G2 DD, severe RVSP.  Does not seem to be on meds.  Appears euvolemic.  No cardiopulmonary symptoms. -Caution with IV fluid  Anemia of renal disease: H&H stable. Recent Labs     07/19/22 0620 07/20/22 0423 07/21/22 0216 07/22/22 0330 07/23/22 0239 07/24/22 0644 07/25/22 0042 07/26/22 0313 07/31/22 1536 08/01/22 0220  HGB 8.7* 8.4* 9.2* 9.3* 8.8* 10.2* 9.6* 9.9* 10.4* 9.2*  -Monitor intermittently  Complex left renal cyst -Incidental finding-complex left renal cyst-repeat ultrasound 6 months  -Outpatient follow-up with urology   Controlled NIDDM-2 with CKD-4: A1c 6.4% in August 2023.  Blood glucose within acceptable range. Recent Labs  Lab 07/25/22 1943 07/26/22 0816 08/01/22 0207 08/01/22 0904 08/01/22 1124  GLUCAP 87 83 103* 98 98  -Discontinue CBG monitoring -Recheck hemoglobin A1c  Dementia without behavioral disturbance: She is awake and alert.  Oriented only to self. -Reorientation and delirium precaution  Recent right distal femoral fracture s/p ORIF on 07/03/2022 -Per family, nonweightbearing until follow-up with orthopedic surgeon on 1/17. -PT/OT  Essential hypertension: BP within acceptable range. -Continue Norvasc and metoprolol   History of seizures -Continue Keppra   Chronic pain -Continue Neurontin.    Gout -Stable, allopurinol resumed   Obesity Body mass index is 31.45 kg/m.           DVT prophylaxis:  heparin injection 5,000 Units Start: 08/01/22 0600 SCDs Start: 08/01/22 0219  Code Status: Full code Family Communication: Updated family member at bedside Level of care: Telemetry Medical Status is: Inpatient Remains inpatient appropriate because: AKI and possible UTI   Final disposition: TBD Consultants:  PT/OT  Sch Meds:  Scheduled Meds:  allopurinol  100 mg Oral BID   amLODipine  10 mg Oral Daily   ciprofloxacin  500 mg Oral Q breakfast   gabapentin  100 mg Oral BID   heparin  5,000 Units Subcutaneous Q8H   hydrocerin   Topical BID   insulin aspart  0-15 Units Subcutaneous TID WC   insulin aspart  0-5 Units Subcutaneous QHS   levETIRAcetam  500 mg Oral Daily   loratadine  10 mg Oral Daily    metoprolol tartrate  100 mg Oral BID   Continuous Infusions: PRN Meds:.acetaminophen **OR** acetaminophen, ondansetron **OR** ondansetron (ZOFRAN) IV, senna-docusate  Antimicrobials: Anti-infectives (From admission, onward)    Start     Dose/Rate Route Frequency Ordered Stop   08/01/22 0800  ciprofloxacin (CIPRO) tablet 500 mg        500 mg Oral Daily with breakfast 08/01/22 0027     07/31/22 1645  gentamicin (GARAMYCIN) 110 mg in dextrose 5 % 50 mL IVPB  Status:  Discontinued        1.5 mg/kg  75.5 kg 105.5 mL/hr over 30 Minutes Intravenous  Once 07/31/22 1631 07/31/22 1631   07/31/22 1645  ciprofloxacin (CIPRO) IVPB 400 mg        400 mg 200 mL/hr over 60 Minutes Intravenous  Once 07/31/22 1633 07/31/22 2006   07/31/22 1645  vancomycin (VANCOCIN) IVPB 1000 mg/200 mL premix       See Hyperspace for full Linked Orders Report.   1,000 mg 200 mL/hr over 60 Minutes Intravenous  Once 07/31/22 1635 07/31/22 1829   07/31/22 1645  vancomycin (VANCOCIN) 500 mg in sodium chloride 0.9 % 100 mL IVPB       See Hyperspace  for full Linked Orders Report.   500 mg 100 mL/hr over 60 Minutes Intravenous  Once 07/31/22 1635 07/31/22 1829   07/31/22 1635  vancomycin variable dose per unstable renal function (pharmacist dosing)  Status:  Discontinued         Does not apply See admin instructions 07/31/22 1635 08/01/22 0828   07/31/22 1630  cefTRIAXone (ROCEPHIN) 1 g in sodium chloride 0.9 % 100 mL IVPB  Status:  Discontinued        1 g 200 mL/hr over 30 Minutes Intravenous  Once 07/31/22 1624 07/31/22 1630        I have personally reviewed the following labs and images: CBC: Recent Labs  Lab 07/26/22 0313 07/31/22 1536 08/01/22 0220  WBC 6.4 6.7 5.7  NEUTROABS  --  4.6 3.9  HGB 9.9* 10.4* 9.2*  HCT 32.0* 32.7* 29.4*  MCV 94.1 93.7 93.6  PLT 311 314 263   BMP &GFR Recent Labs  Lab 07/26/22 0313 07/31/22 1536 08/01/22 0220  NA 136 137 137  K 3.7 4.3 3.7  CL 99 101 103  CO2 '26 24 22   '$ GLUCOSE 84 156* 99  BUN 25* 56* 50*  CREATININE 1.62* 2.76* 2.46*  CALCIUM 9.0 9.6 9.1   Estimated Creatinine Clearance: 13.9 mL/min (A) (by C-G formula based on SCr of 2.46 mg/dL (H)). Liver & Pancreas: Recent Labs  Lab 07/31/22 1536  AST 23  ALT 9  ALKPHOS 114  BILITOT 0.3  PROT 7.6  ALBUMIN 3.3*   No results for input(s): "LIPASE", "AMYLASE" in the last 168 hours. No results for input(s): "AMMONIA" in the last 168 hours. Diabetic: No results for input(s): "HGBA1C" in the last 72 hours. Recent Labs  Lab 07/25/22 1943 07/26/22 0816 08/01/22 0207 08/01/22 0904 08/01/22 1124  GLUCAP 87 83 103* 98 98   Cardiac Enzymes: No results for input(s): "CKTOTAL", "CKMB", "CKMBINDEX", "TROPONINI" in the last 168 hours. No results for input(s): "PROBNP" in the last 8760 hours. Coagulation Profile: No results for input(s): "INR", "PROTIME" in the last 168 hours. Thyroid Function Tests: No results for input(s): "TSH", "T4TOTAL", "FREET4", "T3FREE", "THYROIDAB" in the last 72 hours. Lipid Profile: No results for input(s): "CHOL", "HDL", "LDLCALC", "TRIG", "CHOLHDL", "LDLDIRECT" in the last 72 hours. Anemia Panel: No results for input(s): "VITAMINB12", "FOLATE", "FERRITIN", "TIBC", "IRON", "RETICCTPCT" in the last 72 hours. Urine analysis:    Component Value Date/Time   COLORURINE AMBER (A) 08/01/2022 0515   APPEARANCEUR TURBID (A) 08/01/2022 0515   LABSPEC 1.016 08/01/2022 0515   PHURINE 5.0 08/01/2022 0515   GLUCOSEU NEGATIVE 08/01/2022 0515   HGBUR NEGATIVE 08/01/2022 0515   BILIRUBINUR NEGATIVE 08/01/2022 0515   KETONESUR NEGATIVE 08/01/2022 0515   PROTEINUR 100 (A) 08/01/2022 0515   UROBILINOGEN 0.2 11/21/2008 1820   NITRITE NEGATIVE 08/01/2022 0515   LEUKOCYTESUR LARGE (A) 08/01/2022 0515   Sepsis Labs: Invalid input(s): "PROCALCITONIN", "LACTICIDVEN"  Microbiology: No results found for this or any previous visit (from the past 240 hour(s)).  Radiology  Studies: CT ABDOMEN PELVIS WO CONTRAST  Result Date: 07/31/2022 CLINICAL DATA:  Recurrent complicated UTI EXAM: CT ABDOMEN AND PELVIS WITHOUT CONTRAST TECHNIQUE: Multidetector CT imaging of the abdomen and pelvis was performed following the standard protocol without IV contrast. RADIATION DOSE REDUCTION: This exam was performed according to the departmental dose-optimization program which includes automated exposure control, adjustment of the mA and/or kV according to patient size and/or use of iterative reconstruction technique. COMPARISON:  Renal ultrasound 07/23/2022. Prior noncontrast CT 07/17/2022 FINDINGS:  Lower chest: Heart is enlarged. Prominent vascular calcifications are seen including along the coronary arteries. There is some linear opacity at the bases likely scar or atelectasis. Breathing motion. Hepatobiliary: Question slight nodular contour to the liver. Pancreas: Preserved pancreatic parenchyma. Spleen: No splenic enlargement. Adrenals/Urinary Tract: Stable adrenal glands. Once again there is a left-sided renal cystic focus with some marginal calcifications, unchanged from previous. No renal collecting system dilatation. Contracted urinary bladder with a Foley catheter. Mild stranding adjacent to the bladder is improving from the prior CT scan. Stomach/Bowel: The stomach is nondilated. Small hiatal hernia. On this non oral contrast exam large bowel has a normal course and caliber with scattered colonic stool. Still particularly along the rectum. Small bowel is nondilated. There are surgical changes along small bowel in the left mid mesentery, unchanged from previous. Vascular/Lymphatic: Diffuse vascular calcifications are seen. Normal caliber aorta and IVC. No developing abnormal lymph node enlargement seen in the abdomen and pelvis. Reproductive: Uterus is absent Other: Multiple presumed injection sites along the anterior pelvic and abdominal wall subcutaneous fat. Musculoskeletal: Multifocal  degenerative changes along the spine with disc bulging and stenosis. Slightly heterogeneous appearance to the marrow as well particularly at L4 and of the pelvis with thickened trabecula. Please correlate for any known history if needed additional workup. IMPRESSION: Foley catheter in the bladder which is decompressed. The previous wall thickening and stranding is improving from the prior CT. No proximal collecting system dilatation. Persistent complex cystic lesion of the left kidney with some marginal calcification. Please correlate for any prior workup for a postcontrast study may be useful when appropriate to exclude abnormal enhancement. Otherwise a follow-up study could be performed in 6 months. Persistent heterogeneous marrow particularly at L4 and in the pelvis. Please correlate for any known history or additional evaluation when appropriate Electronically Signed   By: Jill Side M.D.   On: 07/31/2022 18:16   CT Head Wo Contrast  Result Date: 07/31/2022 CLINICAL DATA:  Mental status change, unknown cause EXAM: CT HEAD WITHOUT CONTRAST TECHNIQUE: Contiguous axial images were obtained from the base of the skull through the vertex without intravenous contrast. RADIATION DOSE REDUCTION: This exam was performed according to the departmental dose-optimization program which includes automated exposure control, adjustment of the mA and/or kV according to patient size and/or use of iterative reconstruction technique. COMPARISON:  Head CT 07/17/2022 FINDINGS: Brain: No intracranial hemorrhage, mass effect, or midline shift. Normal brain volume for age. No hydrocephalus. The basilar cisterns are patent. Similar periventricular chronic small vessel ischemia. Punctate remote lacunar infarcts in the basal ganglia. No evidence of territorial infarct or acute ischemia. No extra-axial or intracranial fluid collection. Vascular: Atherosclerosis of skullbase vasculature without hyperdense vessel or abnormal calcification.  Skull: No fracture or focal lesion.  Frontal hyperostosis. Sinuses/Orbits: Occasional mucosal thickening of ethmoid air cells. No mastoid effusion bilateral lens resection. Other: None. IMPRESSION: 1. No acute intracranial abnormality. 2. Mild chronic small vessel ischemia. Electronically Signed   By: Keith Rake M.D.   On: 07/31/2022 18:14   DG Chest Portable 1 View  Result Date: 07/31/2022 CLINICAL DATA:  Contusion. EXAM: PORTABLE CHEST 1 VIEW COMPARISON:  07/16/2022 x-ray and older FINDINGS: Tortuous and calcified aorta. Enlarged cardiopericardial silhouette. No edema, pneumothorax or effusion. No consolidation. Calcification of the tracheobronchial tree. Surgical clips in the left axillary region. Osteopenia and degenerative changes of the visualized skeleton. IMPRESSION: Stable enlarged cardiopericardial silhouette. Electronically Signed   By: Jill Side M.D.   On: 07/31/2022 16:45  Fenix Ruppe T. Harvey  If 7PM-7AM, please contact night-coverage [email or web address] 08/01/2022, 11:40 AM

## 2022-08-01 NOTE — Care Management (Signed)
  Transition of Care [facility]) Screening Note   Patient Details  Name: [name] Date of Birth: 10/03/1928   [facility] CM/SW Contact:    Carles Collet, RN Phone Number: 08/01/2022, 4:34 PM    Transition of Care Department [facility]) has reviewed patient and no TOC needs have been identified at this time. We will continue to monitor patient advancement through interdisciplinary progression rounds.   Admitted from home. Active w Alvis Lemmings, PT OT evals pending

## 2022-08-02 DIAGNOSIS — N390 Urinary tract infection, site not specified: Secondary | ICD-10-CM | POA: Diagnosis not present

## 2022-08-02 DIAGNOSIS — S72491A Other fracture of lower end of right femur, initial encounter for closed fracture: Secondary | ICD-10-CM

## 2022-08-02 DIAGNOSIS — I5032 Chronic diastolic (congestive) heart failure: Secondary | ICD-10-CM

## 2022-08-02 DIAGNOSIS — N179 Acute kidney failure, unspecified: Secondary | ICD-10-CM | POA: Diagnosis not present

## 2022-08-02 LAB — RENAL FUNCTION PANEL
Albumin: 2.5 g/dL — ABNORMAL LOW (ref 3.5–5.0)
Anion gap: 12 (ref 5–15)
BUN: 46 mg/dL — ABNORMAL HIGH (ref 8–23)
CO2: 22 mmol/L (ref 22–32)
Calcium: 9.3 mg/dL (ref 8.9–10.3)
Chloride: 105 mmol/L (ref 98–111)
Creatinine, Ser: 1.91 mg/dL — ABNORMAL HIGH (ref 0.44–1.00)
GFR, Estimated: 24 mL/min — ABNORMAL LOW (ref >60)
Glucose, Bld: 115 mg/dL — ABNORMAL HIGH (ref 70–99)
Phosphorus: 4.2 mg/dL (ref 2.5–4.6)
Potassium: 3.8 mmol/L (ref 3.5–5.1)
Sodium: 139 mmol/L (ref 135–145)

## 2022-08-02 LAB — CBC
HCT: 30.8 % — ABNORMAL LOW (ref 36.0–46.0)
Hemoglobin: 9.9 g/dL — ABNORMAL LOW (ref 12.0–15.0)
MCH: 29.8 pg (ref 26.0–34.0)
MCHC: 32.1 g/dL (ref 30.0–36.0)
MCV: 92.8 fL (ref 80.0–100.0)
Platelets: 236 10*3/uL (ref 150–400)
RBC: 3.32 MIL/uL — ABNORMAL LOW (ref 3.87–5.11)
RDW: 17.9 % — ABNORMAL HIGH (ref 11.5–15.5)
WBC: 5.5 10*3/uL (ref 4.0–10.5)
nRBC: 0 % (ref 0.0–0.2)

## 2022-08-02 LAB — MAGNESIUM: Magnesium: 1.6 mg/dL — ABNORMAL LOW (ref 1.7–2.4)

## 2022-08-02 LAB — URINE CULTURE

## 2022-08-02 LAB — GLUCOSE, CAPILLARY
Glucose-Capillary: 98 mg/dL (ref 70–99)
Glucose-Capillary: 107 mg/dL — ABNORMAL HIGH (ref 70–99)
Glucose-Capillary: 113 mg/dL — ABNORMAL HIGH (ref 70–99)

## 2022-08-02 MED ORDER — CHLORHEXIDINE GLUCONATE CLOTH 2 % EX PADS
6.0000 | MEDICATED_PAD | Freq: Every day | CUTANEOUS | Status: DC
  Administered 2022-08-02: 6 via TOPICAL

## 2022-08-02 MED ORDER — POLYETHYLENE GLYCOL 3350 17 GM/SCOOP PO POWD: 17.0000 g | Freq: Two times a day (BID) | ORAL | 2 refills | Status: DC | PRN

## 2022-08-02 MED ORDER — MAGNESIUM SULFATE 2 GM/50ML IV SOLN
2.0000 g | Freq: Once | INTRAVENOUS | Status: AC
  Administered 2022-08-02: 2 g via INTRAVENOUS
  Filled 2022-08-02: qty 50

## 2022-08-02 MED ORDER — SENNOSIDES-DOCUSATE SODIUM 8.6-50 MG PO TABS: 1.0000 | ORAL_TABLET | Freq: Two times a day (BID) | ORAL | 0 refills | Status: DC | PRN

## 2022-08-02 MED ORDER — LORATADINE 10 MG PO TABS: 10.0000 mg | ORAL_TABLET | Freq: Every day | ORAL | Status: DC | PRN

## 2022-08-02 NOTE — Progress Notes (Signed)
Discharge instructions given to pt and her caregiver. Both verbalized understanding and had no further questions. PT[facility] has been called for pt and she is currently in room waiting for pick up

## 2022-08-02 NOTE — Progress Notes (Signed)
Pt still in room resting comfortably in bed waiting for PT[facility] to pick her up

## 2022-08-02 NOTE — Progress Notes (Signed)
OT Cancellation Note  Patient Details Name: [name] MRN: [id number] DOB: [birth year]   Cancelled Treatment:    Reason Eval/Treat Not Completed: Other (comment) (Pt discharged from unit and currently waiting for transport home. Will defer OT evaluation to [facility] upon returning home as per chart, pt is already established with. Thank you for the referral.)  Ailene Ravel, OTR/L,CBIS  Supplemental OT - [facility] and [facility] Secure Chat Preferred   08/02/2022, 3:17 PM

## 2022-08-02 NOTE — Progress Notes (Signed)
PTAR is here to pick up . Patient is trying to have a bowel movement. PRN mirilax and senokot given, however patient refused. Pt sipped an estimated amount of 30oz prune juice. Small amount of soft bowel movement noted.  I called patient's daughter and notified her patient is trying to have a bowel movement. On call provider notified and she is ok with patient going home tonight.

## 2022-08-02 NOTE — Progress Notes (Signed)
Physician Discharge Summary  [name] FBP:[id number] DOB: 14-Jul-1929 DOA: 07/31/2022  PCP: Seward Carol, MD  Admit date: 07/31/2022 Discharge date: 08/02/2022 Admitted From: Home Disposition: Home Recommendations for Outpatient Follow-up:  Follow up with PCP in 1 to 2 weeks Outpatient follow-up with urology as previously planned Outpatient follow-up with orthopedic surgery.  Check CMP and CBC in 1 to 2 weeks Please follow up on the following pending results: None  Home Health: PT/OT/RN/SLP Equipment/Devices: Patient has appropriate DME including wheelchair, hospital bed and [facility] lift  Discharge Condition: Stable CODE STATUS: Full code  Follow-up Information     Seward Carol, MD. Schedule an appointment as soon as possible for a visit in 1 week(s).   Specialty: Internal Medicine Contact information: [address] [place] [address] [phone]         Care, [facility] Follow up.   Specialty: Home Health Services Contact information: [place] [address] [phone]                 Hospital course 89 year old F with PMH of dementia, DM-2, CKD stage IV, seizure disorder, right distal femoral fracture-s/p ORIF on 12/5 and recent hospitalization from 12/18-27 for bacteremia, cystitis and AKI with obstructive uropathy presenting with "sediment in urine".  Patient was discharged home with Foley catheter on p.o. Cipro last hospitalization.   In ED, stable vitals.   Foley catheter changed.  Repeat UA from new Foley catheter with bacteria and pyuria.  Cr 2.76 (from 1.6).  Patient was continued on Cipro admitted for AKI and possible UTI.  Patient remained stable throughout hospitalization.  Renal ultrasound with no of hydronephrosis.  AKI improved and continued to improve of IV fluid.  Urine culture with multiple species.  Patient discharged home.  HH PT/OT/RN/SLP renewed on discharge.  Patient has appropriate  DME's.  Daughter updated over the phone, and encouraged to call her orthopedic surgeons office about outpatient follow-up.  Patient also has upcoming appointment with urology for voiding trial.    See individual problem list below for more.   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  Hemodynamically stable.  Not on nephrotoxic meds.  AKI improved and continued to improve off IV fluid.  Renal US without hydronephrosis. -Continue indwelling Foley catheter since patient is NWB after he right femoral surgery. -Repeat renal panel in 1 to 2 weeks   Pyuria/bacteriuria: Patient with chronic Foley.  No UTI symptoms.  Family noted some sediment in Foley catheter.  Foley exchanged in ED. patient had polymicrobial UTI with Enterococcus Faecium and Hafina Alvei, and Hafina Alvei bacteremia last hospitalization.  She was discharged on p.o. Cipro for 8 more days on 12/28.  Urine culture with multiple species here. -Continue p.o. 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  Renal ultrasound without hydronephrosis. -  Decision made to keep Foley catheter since patient is nonweightbearing until follow-up with orthopedic surgery -Outpatient follow-up with urology -[facility] RN ordered.   Chronic diastolic CHF/severe PAH: TTE in  02/2022 with LVEF of 60 to 65%, G2 DD, severe RVSP.  Does not seem to be on meds.  Appears euvolemic.  No cardiopulmonary symptoms.   Anemia of renal disease: H&H stable.   Complex left renal cyst -Incidental finding-complex left renal cyst-repeat ultrasound 6 months  -Outpatient follow-up with urology   Controlled NIDDM-2 with CKD-4: A1c 6.4% in August 2023.  Blood glucose within acceptable range.  Dementia without behavioral disturbance: She is awake and alert.  Oriented only to self. -Reorientation and delirium precaution   Recent right distal femoral fracture s/p ORIF on 07/03/2022 -Per family, nonweightbearing until follow-up with orthopedic surgeon -[facility] PT/OT ordered.  Patient has appropriate DME   Essential hypertension: BP within acceptable range. -Continue Norvasc and metoprolol   History of seizures -Continue Keppra   Chronic pain -Continue Neurontin.    Gout -Stable, allopurinol resumed   Obesity Body mass index is 31.45 kg/m.            Vital signs Vitals:   08/01/22 1546 08/01/22 2053 08/02/22 0445 08/02/22 0818  BP: (!) 144/57 (!) 151/62 (!) 145/58 (!) 167/51  Pulse: 77 81 78 74  Temp: 98 F (36.7 C) 98.3 F (36.8 C) 98.2 F (36.8 C) 98.2 F (36.8 C)  Resp: _0 Height:      Weight:      SpO2: 93% 93% 94% 95%  TempSrc: Oral Oral Oral Oral  BMI (Calculated):         Discharge exam GENERAL: No apparent distress.  Nontoxic. HEENT: MMM.  Vision and hearing grossly intact.  NECK: Supple.  No apparent JVD.  RESP:  No IWOB.  Fair aeration bilaterally. CVS:  RRR. Heart sounds normal.  ABD/GI/GU: BS+. Abd soft, NTND.  Clean looking urine in Foley bag. MSK/EXT:  S/p right distal femoral fracture repair.  Surgical wound healing. SKIN: Surgical wound on RLE healed. NEURO: Awake, alert and oriented to self and place.  No apparent focal neuro deficit. PSYCH: Calm. Normal affect.   Discharge Instructions Discharge Instructions     Call MD  for:  difficulty breathing, headache or visual disturbances   Complete by: As directed    Call MD for:  extreme fatigue   Complete by: As directed    Call MD for:  persistant dizziness or light-headedness   Complete by: As directed    Call MD for:  persistant nausea and vomiting   Complete by: As directed    Diet - low sodium heart healthy   Complete by: As directed    Discharge instructions   Complete by: As directed    It has been a pleasure taking care of you!  You were hospitalized due to acute kidney injury that has improved.  Acute kidney injury could be from dehydration.  We recommend good hydration.  There  was also some concern about possible UTI which seems to be less likely at this point.  However, you can continue ciprofloxacin as previously planned.  Please call your orthopedic surgeon office for your questions about postsurgical care.  Follow-up with urology as previously planned.   Take care,   Increase activity slowly   Complete by: As directed       Allergies as of 08/02/2022       Reactions   Ace Inhibitors Swelling, Rash,  Cough   Prandin [repaglinide] Other (See Comments)   Caused significant peripheral edema   Dilaudid [hydromorphone Hcl] Itching, Other (See Comments)   Hallucinations (auditory and visual) also   Other Nausea And Vomiting   "Tussionex Pennkinetic ER"   Latex Rash   Tape Rash   Prefers PAPER TAPE, PLEASE!!        Medication List     STOP taking these medications    oxyCODONE 5 MG immediate release tablet Commonly known as: Oxy IR/ROXICODONE       TAKE these medications    acetaminophen 500 MG tablet Commonly known as: TYLENOL Take 2 tablets (1,000 mg total) by mouth every 8 (eight) hours as needed for mild pain or moderate pain.   allopurinol 100 MG tablet Commonly known as: ZYLOPRIM Take 100 mg by mouth 2 (two) times daily.   amLODipine 10 MG tablet Commonly known as: NORVASC Take 10 mg by mouth daily.   blood  glucose meter kit and supplies Kit Dispense based on patient and insurance preference. Use up to four times daily as directed. (FOR ICD-9 250.00, 250.01). For QAC - HS accuchecks.   Centrum Silver 50+Women Tabs Take 1 tablet by mouth daily with breakfast.   ciprofloxacin 500 MG tablet Commonly known as: CIPRO Take 1 tablet (500 mg total) by mouth daily with breakfast.   FreeStyle Libre 2 Sensor Misc USE AS DIRECTED. REAPPLY EVERY 14 DAYS What changed: See the new instructions.   gabapentin 100 MG capsule Commonly known as: NEURONTIN Take 100 mg by mouth 2 (two) times daily.   HYDRALAZINE HCL PO Take 1 tablet by mouth 3 (three) times daily.   levETIRAcetam 500 MG tablet Commonly known as: KEPPRA Take 500 mg by mouth in the morning.   metoprolol tartrate 100 MG tablet Commonly known as: LOPRESSOR Take 100 mg by mouth 2 (two) times daily.   mirtazapine 15 MG tablet Commonly known as: REMERON Take 15 mg by mouth at bedtime.   MUCINEX PO Take 2 tablets by mouth daily.   omeprazole 20 MG capsule Commonly known as: PRILOSEC Take 20 mg by mouth every Monday, Wednesday, and Friday.   polyethylene glycol powder 17 GM/SCOOP powder Commonly known as: MiraLax Take 17 g by mouth 2 (two) times daily as needed for moderate constipation or mild constipation.   senna-docusate 8.6-50 MG tablet Commonly known as: Senokot-S Take 1 tablet by mouth 2 (two) times daily between meals as needed for moderate constipation.        Consultations: None  Procedures/Studies:   DG Knee Right Port  Result Date: 08/01/2022 CLINICAL DATA:  Fracture.  Patient reports surgery on December 6th EXAM: PORTABLE RIGHT KNEE - 1-2 VIEW COMPARISON:  Radiograph 07/03/2022 FINDINGS: Total knee arthroplasty in place. Lateral plate and screw fixation of comminuted distal femur fracture. Unchanged fracture alignment from prior exam. No significant peripheral callus formation or internal bony bridging. No  significant knee joint effusion. Vascular calcifications are seen. IMPRESSION: Unchanged alignment of comminuted distal femur fracture post ORIF. No significant peripheral callus formation or internal bony bridging. Electronically Signed   By: Keith Rake M.D.   On: 08/01/2022 18:06   US RENAL  Result Date: 08/01/2022 CLINICAL DATA:  [id number] AKI (acute kidney injury) ([place]) [id number] EXAM: RENAL / URINARY TRACT ULTRASOUND COMPLETE COMPARISON:  CT 07/31/2022 FINDINGS: Right Kidney: Renal measurements: 9.4 x 4.3 x 4.0 cm = volume: 84.0 mL. No hydronephrosis. Mildly increased renal cortical echogenicity. Small simple renal cysts noted measuring up to 1.2  cm. Left Kidney: Renal measurements: 11.1 x 5.9 x 4.9 cm = volume: 168.2 mL. No hydronephrosis. Mildly increased renal cortical echogenicity. There is a cyst in the lower pole measuring up to 5.3 cm, as seen on recent CT, with peripheral calcification. Bladder: Decompressed with Foley catheter in place. Other: None. IMPRESSION: No hydronephrosis. Mildly increased renal cortical echogenicity bilaterally, as can be seen in medical renal disease. Bilateral renal cysts, as described on recent CT. Electronically Signed   By: Maurine Simmering M.D.   On: 08/01/2022 13:17   CT ABDOMEN PELVIS WO CONTRAST  Result Date: 07/31/2022 CLINICAL DATA:  Recurrent complicated UTI EXAM: CT ABDOMEN AND PELVIS WITHOUT CONTRAST TECHNIQUE: Multidetector CT imaging of the abdomen and pelvis was performed following the standard protocol without IV contrast. RADIATION DOSE REDUCTION: This exam was performed according to the departmental dose-optimization program which includes automated exposure control, adjustment of the mA and/or kV according to patient size and/or use of iterative reconstruction technique. COMPARISON:  Renal ultrasound 07/23/2022. Prior noncontrast CT 07/17/2022 FINDINGS: Lower chest: Heart is enlarged. Prominent vascular calcifications are seen including along the coronary  arteries. There is some linear opacity at the bases likely scar or atelectasis. Breathing motion. Hepatobiliary: Question slight nodular contour to the liver. Pancreas: Preserved pancreatic parenchyma. Spleen: No splenic enlargement. Adrenals/Urinary Tract: Stable adrenal glands. Once again there is a left-sided renal cystic focus with some marginal calcifications, unchanged from previous. No renal collecting system dilatation. Contracted urinary bladder with a Foley catheter. Mild stranding adjacent to the bladder is improving from the prior CT scan. Stomach/Bowel: The stomach is nondilated. Small hiatal hernia. On this non oral contrast exam large bowel has a normal course and caliber with scattered colonic stool. Still particularly along the rectum. Small bowel is nondilated. There are surgical changes along small bowel in the left mid mesentery, unchanged from previous. Vascular/Lymphatic: Diffuse vascular calcifications are seen. Normal caliber aorta and IVC. No developing abnormal lymph node enlargement seen in the abdomen and pelvis. Reproductive: Uterus is absent Other: Multiple presumed injection sites along the anterior pelvic and abdominal wall subcutaneous fat. Musculoskeletal: Multifocal degenerative changes along the spine with disc bulging and stenosis. Slightly heterogeneous appearance to the marrow as well particularly at L4 and of the pelvis with thickened trabecula. Please correlate for any known history if needed additional workup. IMPRESSION: Foley catheter in the bladder which is decompressed. The previous wall thickening and stranding is improving from the prior CT. No proximal collecting system dilatation. Persistent complex cystic lesion of the left kidney with some marginal calcification. Please correlate for any prior workup for a postcontrast study may be useful when appropriate to exclude abnormal enhancement. Otherwise a follow-up study could be performed in 6 months. Persistent  heterogeneous marrow particularly at L4 and in the pelvis. Please correlate for any known history or additional evaluation when appropriate Electronically Signed   By: Jill Side M.D.   On: 07/31/2022 18:16   CT Head Wo Contrast  Result Date: 07/31/2022 CLINICAL DATA:  Mental status change, unknown cause EXAM: CT HEAD WITHOUT CONTRAST TECHNIQUE: Contiguous axial images were obtained from the base of the skull through the vertex without intravenous contrast. RADIATION DOSE REDUCTION: This exam was performed according to the departmental dose-optimization program which includes automated exposure control, adjustment of the mA and/or kV according to patient size and/or use of iterative reconstruction technique. COMPARISON:  Head CT 07/17/2022 FINDINGS: Brain: No intracranial hemorrhage, mass effect, or midline shift. Normal brain volume for age. No hydrocephalus.  The basilar cisterns are patent. Similar periventricular chronic small vessel ischemia. Punctate remote lacunar infarcts in the basal ganglia. No evidence of territorial infarct or acute ischemia. No extra-axial or intracranial fluid collection. Vascular: Atherosclerosis of skullbase vasculature without hyperdense vessel or abnormal calcification. Skull: No fracture or focal lesion.  Frontal hyperostosis. Sinuses/Orbits: Occasional mucosal thickening of ethmoid air cells. No mastoid effusion bilateral lens resection. Other: None. IMPRESSION: 1. No acute intracranial abnormality. 2. Mild chronic small vessel ischemia. Electronically Signed   By: Keith Rake M.D.   On: 07/31/2022 18:14   DG Chest Portable 1 View  Result Date: 07/31/2022 CLINICAL DATA:  Contusion. EXAM: PORTABLE CHEST 1 VIEW COMPARISON:  07/16/2022 x-ray and older FINDINGS: Tortuous and calcified aorta. Enlarged cardiopericardial silhouette. No edema, pneumothorax or effusion. No consolidation. Calcification of the tracheobronchial tree. Surgical clips in the left axillary region.  Osteopenia and degenerative changes of the visualized skeleton. IMPRESSION: Stable enlarged cardiopericardial silhouette. Electronically Signed   By: Jill Side M.D.   On: 07/31/2022 16:45   US RENAL  Result Date: 07/23/2022 CLINICAL DATA:  Hydronephrosis EXAM: RENAL / URINARY TRACT ULTRASOUND COMPLETE COMPARISON:  CT 07/17/2022 FINDINGS: Right Kidney: Renal measurements: 9.5 x 4.2 x 4.9 cm = volume: 102.4 mL. There are 2 simple appearing cysts, measuring up to 1.2 cm and 1.3 cm. Increased renal cortical echogenicity. Resolved hydronephrosis. Left Kidney: Renal measurements: 12.2 x 6.7 x 5.7 cm = volume: 246 mL. There is a 4.7 cm simple renal cyst. Hydronephrosis has resolved. Increased renal cortical echogenicity. Bladder: Decompressed with Foley catheter in place. Other: Small right pleural effusion noted. IMPRESSION: Resolved bilateral hydronephrosis after bladder decompression with Foley catheter. Increased renal cortical echogenicity bilaterally, as can be seen in medical renal disease. Electronically Signed   By: Maurine Simmering M.D.   On: 07/23/2022 10:47   CT ABDOMEN PELVIS WO CONTRAST  Result Date: 07/17/2022 CLINICAL DATA:  Acute abdominal pain EXAM: CT ABDOMEN AND PELVIS WITHOUT CONTRAST TECHNIQUE: Multidetector CT imaging of the abdomen and pelvis was performed following the standard protocol without IV contrast. RADIATION DOSE REDUCTION: This exam was performed according to the departmental dose-optimization program which includes automated exposure control, adjustment of the mA and/or kV according to patient size and/or use of iterative reconstruction technique. COMPARISON:  None Available. FINDINGS: Lower chest: The heart is enlarged. There is atelectasis in the lung bases. Hepatobiliary: No focal liver abnormality is seen. Status post cholecystectomy. No biliary dilatation. Pancreas: Unremarkable. No pancreatic ductal dilatation or surrounding inflammatory changes. Spleen: Normal in size  without focal abnormality. Adrenals/Urinary Tract: The bladder is markedly distended. There is a small amount of air in the bladder and questionable air in the bladder wall anteriorly image 3/62 and inflammatory stranding surrounding the anterior portion of the bladder. There is mild bilateral hydroureteronephrosis. No obstructing calculi visualized. There is a mildly complex left renal cyst with thin peripheral calcifications measuring 4.5 cm in diameter. The adrenal glands are within normal limits. Stomach/Bowel: Stomach is within normal limits. Appendix appears normal. No evidence of bowel wall thickening, distention, or inflammatory changes. Small bowel anastomosis is present. Vascular/Lymphatic: Aortic atherosclerosis. No enlarged abdominal or pelvic lymph nodes. Reproductive: Status post hysterectomy. No adnexal masses. Other: No abdominal wall hernia or abnormality. No abdominopelvic ascites. Body wall edema present. Musculoskeletal: Bones are diffusely osteopenic. There is increased trabeculation in the right hemipelvis and within the L4 vertebral body which may be related to Paget's disease. IMPRESSION: 1. Markedly distended bladder with air in the bladder and  questionable air in the bladder wall. There is surrounding inflammatory stranding. 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 2. Mild bilateral hydroureteronephrosis, likely secondary to bladder distension. No obstructing calculi visualized. 3. Mildly complex left renal cyst measuring 4.5 cm. Recommend follow-up ultrasound in 6 months to confirm stability. Aortic Atherosclerosis (ICD10-I70.0). Electronically Signed   By: Ronney Asters M.D.   On: 07/17/2022 01:51   CT Head Wo Contrast  Result Date: 07/17/2022 CLINICAL DATA:  Delirium. EXAM: CT HEAD WITHOUT CONTRAST TECHNIQUE: Contiguous axial images were obtained from the base of the skull through the vertex without intravenous contrast. RADIATION DOSE REDUCTION: This exam was performed  according to the departmental dose-optimization program which includes automated exposure control, adjustment of the mA and/or kV according to patient size and/or use of iterative reconstruction technique. COMPARISON:  Head CT 07/01/2022.  MRI brain 05/02/2005. FINDINGS: Brain: No evidence of acute infarction, hemorrhage, hydrocephalus, extra-axial collection or mass lesion/mass effect. There stable mild periventricular white matter hypodensity, likely chronic small vessel ischemic change. Vascular: Atherosclerotic calcifications are present within the cavernous internal carotid arteries. Skull: Normal. Negative for fracture or focal lesion. Sinuses/Orbits: No acute finding. Other: None. IMPRESSION: No acute intracranial abnormality. Electronically Signed   By: Ronney Asters M.D.   On: 07/17/2022 01:41   DG Chest Port 1 View  Result Date: 07/16/2022 CLINICAL DATA:  Questionable sepsis - evaluate for abnormality EXAM: PORTABLE CHEST 1 VIEW COMPARISON:  chest x-ray 07/02/2022 FINDINGS: The heart and mediastinal contours are unchanged. Aortic calcification No focal consolidation. No pulmonary edema. No pleural effusion. No pneumothorax. No acute osseous abnormality. Bilateral shoulder degenerative changes. IMPRESSION: 1. No active disease. 2.  Aortic Atherosclerosis (ICD10-I70.0). Electronically Signed   By: Iven Finn M.D.   On: 07/16/2022 23:41       The results of significant diagnostics from this hospitalization (including imaging, microbiology, ancillary and laboratory) are listed below for reference.     Microbiology: Recent Results (from the past 240 hour(s))  Urine Culture     Status: Abnormal   Collection Time: 07/31/22  3:05 PM   Specimen: In/Out Cath Urine  Result Value Ref Range Status   Specimen Description   Final    IN/OUT CATH URINE Performed at [facility], [address], [place], [place] [address]    Special Requests   Final    NONE Performed at [facility], [address] [place], [place] [address]    Culture MULTIPLE SPECIES PRESENT, SUGGEST RECOLLECTION (A)  Final   Report Status 08/02/2022 FINAL  Final     Labs:  CBC: Recent Labs  Lab 07/31/22 1536 08/01/22 0220 08/02/22 0243  WBC 6.7 5.7 5.5  NEUTROABS 4.6 3.9  --   HGB 10.4* 9.2* 9.9*  HCT 32.7* 29.4* 30.8*  MCV 93.7 93.6 92.8  PLT 314 263 236   BMP &GFR Recent Labs  Lab 07/31/22 1536 08/01/22 0220 08/02/22 0243  NA 137 137 139  K 4.3 3.7 3.8  CL 101 103 105  CO2 _0 GLUCOSE 156* 99 115*  BUN 56* 50* 46*  CREATININE 2.76* 2.46* 1.91*  CALCIUM 9.6 9.1 9.3  MG  --   --  1.6*  PHOS  --   --  4.2   Estimated Creatinine Clearance: 17.8 mL/min (A) (by C-G formula based on SCr of 1.91 mg/dL (H)). Liver & Pancreas: Recent Labs  Lab 07/31/22 1536 08/02/22 0243  AST 23  --   ALT 9  --  ALKPHOS 114  --   BILITOT 0.3  --   PROT 7.6  --   ALBUMIN 3.3* 2.5*   No results for input(s): "LIPASE", "AMYLASE" in the last 168 hours. No results for input(s): "AMMONIA" in the last 168 hours. Diabetic: No results for input(s): "HGBA1C" in the last 72 hours. Recent Labs  Lab 08/01/22 0904 08/01/22 1124 08/01/22 2046 08/02/22 0815 08/02/22 1224  GLUCAP 98 98 119* 98 107*   Cardiac Enzymes: No results for input(s): "CKTOTAL", "CKMB", "CKMBINDEX", "TROPONINI" in the last 168 hours. No results for input(s): "PROBNP" in the last 8760 hours. Coagulation Profile: No results for input(s): "INR", "PROTIME" in the last 168 hours. Thyroid Function Tests: No results for input(s): "TSH", "T4TOTAL", "FREET4", "T3FREE", "THYROIDAB" in the last 72 hours. Lipid Profile: No results for input(s): "CHOL", "HDL", "LDLCALC", "TRIG", "CHOLHDL", "LDLDIRECT" in the last 72 hours. Anemia Panel: No results for input(s): "VITAMINB12", "FOLATE", "FERRITIN", "TIBC", "IRON", "RETICCTPCT" in the last 72 hours. Urine analysis:    Component Value Date/Time    COLORURINE AMBER (A) 08/01/2022 0515   APPEARANCEUR TURBID (A) 08/01/2022 0515   LABSPEC 1.016 08/01/2022 0515   PHURINE 5.0 08/01/2022 0515   GLUCOSEU NEGATIVE 08/01/2022 0515   HGBUR NEGATIVE 08/01/2022 0515   BILIRUBINUR NEGATIVE 08/01/2022 0515   KETONESUR NEGATIVE 08/01/2022 0515   PROTEINUR 100 (A) 08/01/2022 0515   UROBILINOGEN 0.2 11/21/2008 1820   NITRITE NEGATIVE 08/01/2022 0515   LEUKOCYTESUR LARGE (A) 08/01/2022 0515   Sepsis Labs: Invalid input(s): "PROCALCITONIN", "LACTICIDVEN"   SIGNED:  Mercy Riding, MD  [facility] 08/02/2022, 3:23 PM

## 2022-08-02 NOTE — TOC CM/SW Note (Addendum)
Spoke to patient and a friend at bedside. Patient from home with daughter [name] , who will be at bedside around 4 pm. NCM will try to revisit at that time.   Patient active with [facility] for RN,PT,OT,SP will need new orders to continue care   Patient being discharged called daughter [name] [phone] , she is aware. She wants to continue services with [facility]. Will need orders and face to face for HHRN,PT,OT and SP. MD aware. Cory with [facility] aware discharge today .   Patient will need ambulance transport home.   Confirmed address with daughter. Daughter is at the home now. Companion at bedside will stay with patient and follow ambulance home.   [facility] called, paperwork on chart

## 2022-08-03 NOTE — Discharge Summary (Signed)
Physician Discharge Summary  [name] FBP:[id number] DOB: 14-Jul-1929 DOA: 07/31/2022  PCP: Seward Carol, MD  Admit date: 07/31/2022 Discharge date: 08/02/2022 Admitted From: Home Disposition: Home Recommendations for Outpatient Follow-up:  Follow up with PCP in 1 to 2 weeks Outpatient follow-up with urology as previously planned Outpatient follow-up with orthopedic surgery.  Check CMP and CBC in 1 to 2 weeks Please follow up on the following pending results: None  Home Health: PT/OT/RN/SLP Equipment/Devices: Patient has appropriate DME including wheelchair, hospital bed and [facility] lift  Discharge Condition: Stable CODE STATUS: Full code  Follow-up Information     Seward Carol, MD. Schedule an appointment as soon as possible for a visit in 1 week(s).   Specialty: Internal Medicine Contact information: [address] [place] [address] [phone]         Care, [facility] Follow up.   Specialty: Home Health Services Contact information: [place] [address] [phone]                 Hospital course 89 year old F with PMH of dementia, DM-2, CKD stage IV, seizure disorder, right distal femoral fracture-s/p ORIF on 12/5 and recent hospitalization from 12/18-27 for bacteremia, cystitis and AKI with obstructive uropathy presenting with "sediment in urine".  Patient was discharged home with Foley catheter on p.o. Cipro last hospitalization.   In ED, stable vitals.   Foley catheter changed.  Repeat UA from new Foley catheter with bacteria and pyuria.  Cr 2.76 (from 1.6).  Patient was continued on Cipro admitted for AKI and possible UTI.  Patient remained stable throughout hospitalization.  Renal ultrasound with no of hydronephrosis.  AKI improved and continued to improve of IV fluid.  Urine culture with multiple species.  Patient discharged home.  HH PT/OT/RN/SLP renewed on discharge.  Patient has appropriate  DME's.  Daughter updated over the phone, and encouraged to call her orthopedic surgeons office about outpatient follow-up.  Patient also has upcoming appointment with urology for voiding trial.    See individual problem list below for more.   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  Hemodynamically stable.  Not on nephrotoxic meds.  AKI improved and continued to improve off IV fluid.  Renal US without hydronephrosis. -Continue indwelling Foley catheter since patient is NWB after he right femoral surgery. -Repeat renal panel in 1 to 2 weeks   Pyuria/bacteriuria: Patient with chronic Foley.  No UTI symptoms.  Family noted some sediment in Foley catheter.  Foley exchanged in ED. patient had polymicrobial UTI with Enterococcus Faecium and Hafina Alvei, and Hafina Alvei bacteremia last hospitalization.  She was discharged on p.o. Cipro for 8 more days on 12/28.  Urine culture with multiple species here. -Continue p.o. 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  Renal ultrasound without hydronephrosis. -  Decision made to keep Foley catheter since patient is nonweightbearing until follow-up with orthopedic surgery -Outpatient follow-up with urology -[facility] RN ordered.   Chronic diastolic CHF/severe PAH: TTE in  02/2022 with LVEF of 60 to 65%, G2 DD, severe RVSP.  Does not seem to be on meds.  Appears euvolemic.  No cardiopulmonary symptoms.   Anemia of renal disease: H&H stable.   Complex left renal cyst -Incidental finding-complex left renal cyst-repeat ultrasound 6 months  -Outpatient follow-up with urology   Controlled NIDDM-2 with CKD-4: A1c 6.4% in August 2023.  Blood glucose within acceptable range.  Dementia without behavioral disturbance: She is awake and alert.  Oriented only to self. -Reorientation and delirium precaution   Recent right distal femoral fracture s/p ORIF on 07/03/2022 -Per family, nonweightbearing until follow-up with orthopedic surgeon -[facility] PT/OT ordered.  Patient has appropriate DME   Essential hypertension: BP within acceptable range. -Continue Norvasc and metoprolol   History of seizures -Continue Keppra   Chronic pain -Continue Neurontin.    Gout -Stable, allopurinol resumed   Obesity Body mass index is 31.45 kg/m.            Vital signs Vitals:   08/01/22 2053 08/02/22 0445 08/02/22 0818 08/02/22 1550  BP: (!) 151/62 (!) 145/58 (!) 167/51 (!) 172/63  Pulse: 81 78 74 71  Temp: 98.3 F (36.8 C) 98.2 F (36.8 C) 98.2 F (36.8 C) 98.2 F (36.8 C)  Resp: '17 16 17 17  '$ Height:      Weight:      SpO2: 93% 94% 95% 96%  TempSrc: Oral Oral Oral Oral  BMI (Calculated):         Discharge exam GENERAL: No apparent distress.  Nontoxic. HEENT: MMM.  Vision and hearing grossly intact.  NECK: Supple.  No apparent JVD.  RESP:  No IWOB.  Fair aeration bilaterally. CVS:  RRR. Heart sounds normal.  ABD/GI/GU: BS+. Abd soft, NTND.  Clean looking urine in Foley bag. MSK/EXT:  S/p right distal femoral fracture repair.  Surgical wound healing. SKIN: Surgical wound on RLE healed. NEURO: Awake, alert and oriented to self and place.  No apparent focal neuro deficit. PSYCH: Calm. Normal affect.   Discharge Instructions Discharge Instructions     Call MD  for:  difficulty breathing, headache or visual disturbances   Complete by: As directed    Call MD for:  extreme fatigue   Complete by: As directed    Call MD for:  persistant dizziness or light-headedness   Complete by: As directed    Call MD for:  persistant nausea and vomiting   Complete by: As directed    Diet - low sodium heart healthy   Complete by: As directed    Discharge instructions   Complete by: As directed    It has been a pleasure taking care of you!  You were hospitalized due to acute kidney injury that has improved.  Acute kidney injury could be from dehydration.  We recommend good hydration.  There  was also some concern about possible UTI which seems to be less likely at this point.  However, you can continue ciprofloxacin as previously planned.  Please call your orthopedic surgeon office for your questions about postsurgical care.  Follow-up with urology as previously planned.   Take care,   Increase activity slowly   Complete by: As directed       Allergies as of 08/02/2022       Reactions   Ace Inhibitors Swelling, Rash,  Cough   Prandin [repaglinide] Other (See Comments)   Caused significant peripheral edema   Dilaudid [hydromorphone Hcl] Itching, Other (See Comments)   Hallucinations (auditory and visual) also   Other Nausea And Vomiting   "Tussionex Pennkinetic ER"   Latex Rash   Tape Rash   Prefers PAPER TAPE, PLEASE!!        Medication List     STOP taking these medications    oxyCODONE 5 MG immediate release tablet Commonly known as: Oxy IR/ROXICODONE       TAKE these medications    acetaminophen 500 MG tablet Commonly known as: TYLENOL Take 2 tablets (1,000 mg total) by mouth every 8 (eight) hours as needed for mild pain or moderate pain.   allopurinol 100 MG tablet Commonly known as: ZYLOPRIM Take 100 mg by mouth 2 (two) times daily.   amLODipine 10 MG tablet Commonly known as: NORVASC Take 10 mg by mouth daily.   blood  glucose meter kit and supplies Kit Dispense based on patient and insurance preference. Use up to four times daily as directed. (FOR ICD-9 250.00, 250.01). For QAC - HS accuchecks.   Centrum Silver 50+Women Tabs Take 1 tablet by mouth daily with breakfast.   ciprofloxacin 500 MG tablet Commonly known as: CIPRO Take 1 tablet (500 mg total) by mouth daily with breakfast.   FreeStyle Libre 2 Sensor Misc USE AS DIRECTED. REAPPLY EVERY 14 DAYS What changed: See the new instructions.   gabapentin 100 MG capsule Commonly known as: NEURONTIN Take 100 mg by mouth 2 (two) times daily.   HYDRALAZINE HCL PO Take 1 tablet by mouth 3 (three) times daily.   levETIRAcetam 500 MG tablet Commonly known as: KEPPRA Take 500 mg by mouth in the morning.   metoprolol tartrate 100 MG tablet Commonly known as: LOPRESSOR Take 100 mg by mouth 2 (two) times daily.   mirtazapine 15 MG tablet Commonly known as: REMERON Take 15 mg by mouth at bedtime.   MUCINEX PO Take 2 tablets by mouth daily.   omeprazole 20 MG capsule Commonly known as: PRILOSEC Take 20 mg by mouth every Monday, Wednesday, and Friday.   polyethylene glycol powder 17 GM/SCOOP powder Commonly known as: MiraLax Take 17 g by mouth 2 (two) times daily as needed for moderate constipation or mild constipation.   senna-docusate 8.6-50 MG tablet Commonly known as: Senokot-S Take 1 tablet by mouth 2 (two) times daily between meals as needed for moderate constipation.        Consultations: None  Procedures/Studies:   DG Knee Right Port  Result Date: 08/01/2022 CLINICAL DATA:  Fracture.  Patient reports surgery on December 6th EXAM: PORTABLE RIGHT KNEE - 1-2 VIEW COMPARISON:  Radiograph 07/03/2022 FINDINGS: Total knee arthroplasty in place. Lateral plate and screw fixation of comminuted distal femur fracture. Unchanged fracture alignment from prior exam. No significant peripheral callus formation or internal bony bridging. No  significant knee joint effusion. Vascular calcifications are seen. IMPRESSION: Unchanged alignment of comminuted distal femur fracture post ORIF. No significant peripheral callus formation or internal bony bridging. Electronically Signed   By: Keith Rake M.D.   On: 08/01/2022 18:06   US RENAL  Result Date: 08/01/2022 CLINICAL DATA:  [id number] AKI (acute kidney injury) ([place]) [id number] EXAM: RENAL / URINARY TRACT ULTRASOUND COMPLETE COMPARISON:  CT 07/31/2022 FINDINGS: Right Kidney: Renal measurements: 9.4 x 4.3 x 4.0 cm = volume: 84.0 mL. No hydronephrosis. Mildly increased renal cortical echogenicity. Small simple renal cysts noted measuring up to 1.2  cm. Left Kidney: Renal measurements: 11.1 x 5.9 x 4.9 cm = volume: 168.2 mL. No hydronephrosis. Mildly increased renal cortical echogenicity. There is a cyst in the lower pole measuring up to 5.3 cm, as seen on recent CT, with peripheral calcification. Bladder: Decompressed with Foley catheter in place. Other: None. IMPRESSION: No hydronephrosis. Mildly increased renal cortical echogenicity bilaterally, as can be seen in medical renal disease. Bilateral renal cysts, as described on recent CT. Electronically Signed   By: Maurine Simmering M.D.   On: 08/01/2022 13:17   CT ABDOMEN PELVIS WO CONTRAST  Result Date: 07/31/2022 CLINICAL DATA:  Recurrent complicated UTI EXAM: CT ABDOMEN AND PELVIS WITHOUT CONTRAST TECHNIQUE: Multidetector CT imaging of the abdomen and pelvis was performed following the standard protocol without IV contrast. RADIATION DOSE REDUCTION: This exam was performed according to the departmental dose-optimization program which includes automated exposure control, adjustment of the mA and/or kV according to patient size and/or use of iterative reconstruction technique. COMPARISON:  Renal ultrasound 07/23/2022. Prior noncontrast CT 07/17/2022 FINDINGS: Lower chest: Heart is enlarged. Prominent vascular calcifications are seen including along the coronary  arteries. There is some linear opacity at the bases likely scar or atelectasis. Breathing motion. Hepatobiliary: Question slight nodular contour to the liver. Pancreas: Preserved pancreatic parenchyma. Spleen: No splenic enlargement. Adrenals/Urinary Tract: Stable adrenal glands. Once again there is a left-sided renal cystic focus with some marginal calcifications, unchanged from previous. No renal collecting system dilatation. Contracted urinary bladder with a Foley catheter. Mild stranding adjacent to the bladder is improving from the prior CT scan. Stomach/Bowel: The stomach is nondilated. Small hiatal hernia. On this non oral contrast exam large bowel has a normal course and caliber with scattered colonic stool. Still particularly along the rectum. Small bowel is nondilated. There are surgical changes along small bowel in the left mid mesentery, unchanged from previous. Vascular/Lymphatic: Diffuse vascular calcifications are seen. Normal caliber aorta and IVC. No developing abnormal lymph node enlargement seen in the abdomen and pelvis. Reproductive: Uterus is absent Other: Multiple presumed injection sites along the anterior pelvic and abdominal wall subcutaneous fat. Musculoskeletal: Multifocal degenerative changes along the spine with disc bulging and stenosis. Slightly heterogeneous appearance to the marrow as well particularly at L4 and of the pelvis with thickened trabecula. Please correlate for any known history if needed additional workup. IMPRESSION: Foley catheter in the bladder which is decompressed. The previous wall thickening and stranding is improving from the prior CT. No proximal collecting system dilatation. Persistent complex cystic lesion of the left kidney with some marginal calcification. Please correlate for any prior workup for a postcontrast study may be useful when appropriate to exclude abnormal enhancement. Otherwise a follow-up study could be performed in 6 months. Persistent  heterogeneous marrow particularly at L4 and in the pelvis. Please correlate for any known history or additional evaluation when appropriate Electronically Signed   By: Jill Side M.D.   On: 07/31/2022 18:16   CT Head Wo Contrast  Result Date: 07/31/2022 CLINICAL DATA:  Mental status change, unknown cause EXAM: CT HEAD WITHOUT CONTRAST TECHNIQUE: Contiguous axial images were obtained from the base of the skull through the vertex without intravenous contrast. RADIATION DOSE REDUCTION: This exam was performed according to the departmental dose-optimization program which includes automated exposure control, adjustment of the mA and/or kV according to patient size and/or use of iterative reconstruction technique. COMPARISON:  Head CT 07/17/2022 FINDINGS: Brain: No intracranial hemorrhage, mass effect, or midline shift. Normal brain volume for age. No hydrocephalus.  The basilar cisterns are patent. Similar periventricular chronic small vessel ischemia. Punctate remote lacunar infarcts in the basal ganglia. No evidence of territorial infarct or acute ischemia. No extra-axial or intracranial fluid collection. Vascular: Atherosclerosis of skullbase vasculature without hyperdense vessel or abnormal calcification. Skull: No fracture or focal lesion.  Frontal hyperostosis. Sinuses/Orbits: Occasional mucosal thickening of ethmoid air cells. No mastoid effusion bilateral lens resection. Other: None. IMPRESSION: 1. No acute intracranial abnormality. 2. Mild chronic small vessel ischemia. Electronically Signed   By: Keith Rake M.D.   On: 07/31/2022 18:14   DG Chest Portable 1 View  Result Date: 07/31/2022 CLINICAL DATA:  Contusion. EXAM: PORTABLE CHEST 1 VIEW COMPARISON:  07/16/2022 x-ray and older FINDINGS: Tortuous and calcified aorta. Enlarged cardiopericardial silhouette. No edema, pneumothorax or effusion. No consolidation. Calcification of the tracheobronchial tree. Surgical clips in the left axillary region.  Osteopenia and degenerative changes of the visualized skeleton. IMPRESSION: Stable enlarged cardiopericardial silhouette. Electronically Signed   By: Jill Side M.D.   On: 07/31/2022 16:45   US RENAL  Result Date: 07/23/2022 CLINICAL DATA:  Hydronephrosis EXAM: RENAL / URINARY TRACT ULTRASOUND COMPLETE COMPARISON:  CT 07/17/2022 FINDINGS: Right Kidney: Renal measurements: 9.5 x 4.2 x 4.9 cm = volume: 102.4 mL. There are 2 simple appearing cysts, measuring up to 1.2 cm and 1.3 cm. Increased renal cortical echogenicity. Resolved hydronephrosis. Left Kidney: Renal measurements: 12.2 x 6.7 x 5.7 cm = volume: 246 mL. There is a 4.7 cm simple renal cyst. Hydronephrosis has resolved. Increased renal cortical echogenicity. Bladder: Decompressed with Foley catheter in place. Other: Small right pleural effusion noted. IMPRESSION: Resolved bilateral hydronephrosis after bladder decompression with Foley catheter. Increased renal cortical echogenicity bilaterally, as can be seen in medical renal disease. Electronically Signed   By: Maurine Simmering M.D.   On: 07/23/2022 10:47   CT ABDOMEN PELVIS WO CONTRAST  Result Date: 07/17/2022 CLINICAL DATA:  Acute abdominal pain EXAM: CT ABDOMEN AND PELVIS WITHOUT CONTRAST TECHNIQUE: Multidetector CT imaging of the abdomen and pelvis was performed following the standard protocol without IV contrast. RADIATION DOSE REDUCTION: This exam was performed according to the departmental dose-optimization program which includes automated exposure control, adjustment of the mA and/or kV according to patient size and/or use of iterative reconstruction technique. COMPARISON:  None Available. FINDINGS: Lower chest: The heart is enlarged. There is atelectasis in the lung bases. Hepatobiliary: No focal liver abnormality is seen. Status post cholecystectomy. No biliary dilatation. Pancreas: Unremarkable. No pancreatic ductal dilatation or surrounding inflammatory changes. Spleen: Normal in size  without focal abnormality. Adrenals/Urinary Tract: The bladder is markedly distended. There is a small amount of air in the bladder and questionable air in the bladder wall anteriorly image 3/62 and inflammatory stranding surrounding the anterior portion of the bladder. There is mild bilateral hydroureteronephrosis. No obstructing calculi visualized. There is a mildly complex left renal cyst with thin peripheral calcifications measuring 4.5 cm in diameter. The adrenal glands are within normal limits. Stomach/Bowel: Stomach is within normal limits. Appendix appears normal. No evidence of bowel wall thickening, distention, or inflammatory changes. Small bowel anastomosis is present. Vascular/Lymphatic: Aortic atherosclerosis. No enlarged abdominal or pelvic lymph nodes. Reproductive: Status post hysterectomy. No adnexal masses. Other: No abdominal wall hernia or abnormality. No abdominopelvic ascites. Body wall edema present. Musculoskeletal: Bones are diffusely osteopenic. There is increased trabeculation in the right hemipelvis and within the L4 vertebral body which may be related to Paget's disease. IMPRESSION: 1. Markedly distended bladder with air in the bladder and  questionable air in the bladder wall. There is surrounding inflammatory stranding. 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 2. Mild bilateral hydroureteronephrosis, likely secondary to bladder distension. No obstructing calculi visualized. 3. Mildly complex left renal cyst measuring 4.5 cm. Recommend follow-up ultrasound in 6 months to confirm stability. Aortic Atherosclerosis (ICD10-I70.0). Electronically Signed   By: Ronney Asters M.D.   On: 07/17/2022 01:51   CT Head Wo Contrast  Result Date: 07/17/2022 CLINICAL DATA:  Delirium. EXAM: CT HEAD WITHOUT CONTRAST TECHNIQUE: Contiguous axial images were obtained from the base of the skull through the vertex without intravenous contrast. RADIATION DOSE REDUCTION: This exam was performed  according to the departmental dose-optimization program which includes automated exposure control, adjustment of the mA and/or kV according to patient size and/or use of iterative reconstruction technique. COMPARISON:  Head CT 07/01/2022.  MRI brain 05/02/2005. FINDINGS: Brain: No evidence of acute infarction, hemorrhage, hydrocephalus, extra-axial collection or mass lesion/mass effect. There stable mild periventricular white matter hypodensity, likely chronic small vessel ischemic change. Vascular: Atherosclerotic calcifications are present within the cavernous internal carotid arteries. Skull: Normal. Negative for fracture or focal lesion. Sinuses/Orbits: No acute finding. Other: None. IMPRESSION: No acute intracranial abnormality. Electronically Signed   By: Ronney Asters M.D.   On: 07/17/2022 01:41   DG Chest Port 1 View  Result Date: 07/16/2022 CLINICAL DATA:  Questionable sepsis - evaluate for abnormality EXAM: PORTABLE CHEST 1 VIEW COMPARISON:  chest x-ray 07/02/2022 FINDINGS: The heart and mediastinal contours are unchanged. Aortic calcification No focal consolidation. No pulmonary edema. No pleural effusion. No pneumothorax. No acute osseous abnormality. Bilateral shoulder degenerative changes. IMPRESSION: 1. No active disease. 2.  Aortic Atherosclerosis (ICD10-I70.0). Electronically Signed   By: Iven Finn M.D.   On: 07/16/2022 23:41       The results of significant diagnostics from this hospitalization (including imaging, microbiology, ancillary and laboratory) are listed below for reference.     Microbiology: Recent Results (from the past 240 hour(s))  Urine Culture     Status: Abnormal   Collection Time: 07/31/22  3:05 PM   Specimen: In/Out Cath Urine  Result Value Ref Range Status   Specimen Description   Final    IN/OUT CATH URINE Performed at [facility], [address], [place], [place] [address]    Special Requests   Final    NONE Performed at [facility], [address] [place], [place] [address]    Culture MULTIPLE SPECIES PRESENT, SUGGEST RECOLLECTION (A)  Final   Report Status 08/02/2022 FINAL  Final     Labs:  CBC: Recent Labs  Lab 07/31/22 1536 08/01/22 0220 08/02/22 0243  WBC 6.7 5.7 5.5  NEUTROABS 4.6 3.9  --   HGB 10.4* 9.2* 9.9*  HCT 32.7* 29.4* 30.8*  MCV 93.7 93.6 92.8  PLT 314 263 236   BMP &GFR Recent Labs  Lab 07/31/22 1536 08/01/22 0220 08/02/22 0243  NA 137 137 139  K 4.3 3.7 3.8  CL 101 103 105  CO2 _0 GLUCOSE 156* 99 115*  BUN 56* 50* 46*  CREATININE 2.76* 2.46* 1.91*  CALCIUM 9.6 9.1 9.3  MG  --   --  1.6*  PHOS  --   --  4.2   Estimated Creatinine Clearance: 17.8 mL/min (A) (by C-G formula based on SCr of 1.91 mg/dL (H)). Liver & Pancreas: Recent Labs  Lab 07/31/22 1536 08/02/22 0243  AST 23  --   ALT 9  --  ALKPHOS 114  --   BILITOT 0.3  --   PROT 7.6  --   ALBUMIN 3.3* 2.5*   No results for input(s): "LIPASE", "AMYLASE" in the last 168 hours. No results for input(s): "AMMONIA" in the last 168 hours. Diabetic: No results for input(s): "HGBA1C" in the last 72 hours. Recent Labs  Lab 08/01/22 1124 08/01/22 2046 08/02/22 0815 08/02/22 1224 08/02/22 1554  GLUCAP 98 119* 98 107* 113*   Cardiac Enzymes: No results for input(s): "CKTOTAL", "CKMB", "CKMBINDEX", "TROPONINI" in the last 168 hours. No results for input(s): "PROBNP" in the last 8760 hours. Coagulation Profile: No results for input(s): "INR", "PROTIME" in the last 168 hours. Thyroid Function Tests: No results for input(s): "TSH", "T4TOTAL", "FREET4", "T3FREE", "THYROIDAB" in the last 72 hours. Lipid Profile: No results for input(s): "CHOL", "HDL", "LDLCALC", "TRIG", "CHOLHDL", "LDLDIRECT" in the last 72 hours. Anemia Panel: No results for input(s): "VITAMINB12", "FOLATE", "FERRITIN", "TIBC", "IRON", "RETICCTPCT" in the last 72 hours. Urine analysis:    Component Value Date/Time    COLORURINE AMBER (A) 08/01/2022 0515   APPEARANCEUR TURBID (A) 08/01/2022 0515   LABSPEC 1.016 08/01/2022 0515   PHURINE 5.0 08/01/2022 0515   GLUCOSEU NEGATIVE 08/01/2022 0515   HGBUR NEGATIVE 08/01/2022 0515   BILIRUBINUR NEGATIVE 08/01/2022 0515   KETONESUR NEGATIVE 08/01/2022 0515   PROTEINUR 100 (A) 08/01/2022 0515   UROBILINOGEN 0.2 11/21/2008 1820   NITRITE NEGATIVE 08/01/2022 0515   LEUKOCYTESUR LARGE (A) 08/01/2022 0515   Sepsis Labs: Invalid input(s): "PROCALCITONIN", "LACTICIDVEN"   SIGNED:  Mercy Riding, MD  [facility] 08/03/2022, 5:54 PM

## 2022-08-06 DIAGNOSIS — N390 Urinary tract infection, site not specified: Secondary | ICD-10-CM | POA: Diagnosis not present

## 2022-08-06 DIAGNOSIS — R451 Restlessness and agitation: Secondary | ICD-10-CM | POA: Diagnosis not present

## 2022-08-06 DIAGNOSIS — S72451D Displaced supracondylar fracture without intracondylar extension of lower end of right femur, subsequent encounter for closed fracture with routine healing: Secondary | ICD-10-CM | POA: Diagnosis not present

## 2022-08-06 DIAGNOSIS — F0390 Unspecified dementia without behavioral disturbance: Secondary | ICD-10-CM | POA: Diagnosis not present

## 2022-08-06 DIAGNOSIS — N179 Acute kidney failure, unspecified: Secondary | ICD-10-CM | POA: Diagnosis not present

## 2022-08-06 DIAGNOSIS — G40909 Epilepsy, unspecified, not intractable, without status epilepticus: Secondary | ICD-10-CM | POA: Diagnosis not present

## 2022-08-06 DIAGNOSIS — I10 Essential (primary) hypertension: Secondary | ICD-10-CM | POA: Diagnosis not present

## 2022-08-06 DIAGNOSIS — S7290XA Unspecified fracture of unspecified femur, initial encounter for closed fracture: Secondary | ICD-10-CM | POA: Diagnosis not present

## 2022-08-07 DIAGNOSIS — N130 Hydronephrosis with ureteropelvic junction obstruction: Secondary | ICD-10-CM | POA: Diagnosis not present

## 2022-08-07 DIAGNOSIS — N3000 Acute cystitis without hematuria: Secondary | ICD-10-CM | POA: Diagnosis not present

## 2022-08-07 DIAGNOSIS — R338 Other retention of urine: Secondary | ICD-10-CM | POA: Diagnosis not present

## 2022-08-09 DIAGNOSIS — E876 Hypokalemia: Secondary | ICD-10-CM | POA: Diagnosis not present

## 2022-08-09 DIAGNOSIS — R739 Hyperglycemia, unspecified: Secondary | ICD-10-CM | POA: Diagnosis not present

## 2022-08-09 DIAGNOSIS — R569 Unspecified convulsions: Secondary | ICD-10-CM | POA: Diagnosis not present

## 2022-08-09 DIAGNOSIS — I5032 Chronic diastolic (congestive) heart failure: Secondary | ICD-10-CM | POA: Diagnosis not present

## 2022-08-09 DIAGNOSIS — S72401A Unspecified fracture of lower end of right femur, initial encounter for closed fracture: Secondary | ICD-10-CM | POA: Diagnosis not present

## 2022-08-09 DIAGNOSIS — M6281 Muscle weakness (generalized): Secondary | ICD-10-CM | POA: Diagnosis not present

## 2022-08-09 DIAGNOSIS — N179 Acute kidney failure, unspecified: Secondary | ICD-10-CM | POA: Diagnosis not present

## 2022-08-09 DIAGNOSIS — Z8781 Personal history of (healed) traumatic fracture: Secondary | ICD-10-CM | POA: Diagnosis not present

## 2022-08-09 DIAGNOSIS — E1101 Type 2 diabetes mellitus with hyperosmolarity with coma: Secondary | ICD-10-CM | POA: Diagnosis not present

## 2022-08-09 DIAGNOSIS — N1330 Unspecified hydronephrosis: Secondary | ICD-10-CM | POA: Diagnosis not present

## 2022-08-09 DIAGNOSIS — J9601 Acute respiratory failure with hypoxia: Secondary | ICD-10-CM | POA: Diagnosis not present

## 2022-08-09 DIAGNOSIS — G9341 Metabolic encephalopathy: Secondary | ICD-10-CM | POA: Diagnosis not present

## 2022-08-14 DIAGNOSIS — I5032 Chronic diastolic (congestive) heart failure: Secondary | ICD-10-CM | POA: Diagnosis not present

## 2022-08-14 DIAGNOSIS — S72401D Unspecified fracture of lower end of right femur, subsequent encounter for closed fracture with routine healing: Secondary | ICD-10-CM | POA: Diagnosis not present

## 2022-08-14 DIAGNOSIS — E872 Acidosis, unspecified: Secondary | ICD-10-CM | POA: Diagnosis not present

## 2022-08-14 DIAGNOSIS — K219 Gastro-esophageal reflux disease without esophagitis: Secondary | ICD-10-CM | POA: Diagnosis not present

## 2022-08-14 DIAGNOSIS — D631 Anemia in chronic kidney disease: Secondary | ICD-10-CM | POA: Diagnosis not present

## 2022-08-14 DIAGNOSIS — M1030 Gout due to renal impairment, unspecified site: Secondary | ICD-10-CM | POA: Diagnosis not present

## 2022-08-14 DIAGNOSIS — N139 Obstructive and reflux uropathy, unspecified: Secondary | ICD-10-CM | POA: Diagnosis not present

## 2022-08-14 DIAGNOSIS — J9601 Acute respiratory failure with hypoxia: Secondary | ICD-10-CM | POA: Diagnosis not present

## 2022-08-14 DIAGNOSIS — I130 Hypertensive heart and chronic kidney disease with heart failure and stage 1 through stage 4 chronic kidney disease, or unspecified chronic kidney disease: Secondary | ICD-10-CM | POA: Diagnosis not present

## 2022-08-14 DIAGNOSIS — N184 Chronic kidney disease, stage 4 (severe): Secondary | ICD-10-CM | POA: Diagnosis not present

## 2022-08-14 DIAGNOSIS — I700 Atherosclerosis of aorta: Secondary | ICD-10-CM | POA: Diagnosis not present

## 2022-08-14 DIAGNOSIS — G9340 Encephalopathy, unspecified: Secondary | ICD-10-CM | POA: Diagnosis not present

## 2022-08-14 DIAGNOSIS — B952 Enterococcus as the cause of diseases classified elsewhere: Secondary | ICD-10-CM | POA: Diagnosis not present

## 2022-08-14 DIAGNOSIS — E875 Hyperkalemia: Secondary | ICD-10-CM | POA: Diagnosis not present

## 2022-08-14 DIAGNOSIS — F03918 Unspecified dementia, unspecified severity, with other behavioral disturbance: Secondary | ICD-10-CM | POA: Diagnosis not present

## 2022-08-14 DIAGNOSIS — J9811 Atelectasis: Secondary | ICD-10-CM | POA: Diagnosis not present

## 2022-08-14 DIAGNOSIS — M8580 Other specified disorders of bone density and structure, unspecified site: Secondary | ICD-10-CM | POA: Diagnosis not present

## 2022-08-14 DIAGNOSIS — F0392 Unspecified dementia, unspecified severity, with psychotic disturbance: Secondary | ICD-10-CM | POA: Diagnosis not present

## 2022-08-14 DIAGNOSIS — M9701XD Periprosthetic fracture around internal prosthetic right hip joint, subsequent encounter: Secondary | ICD-10-CM | POA: Diagnosis not present

## 2022-08-14 DIAGNOSIS — N3080 Other cystitis without hematuria: Secondary | ICD-10-CM | POA: Diagnosis not present

## 2022-08-14 DIAGNOSIS — M189 Osteoarthritis of first carpometacarpal joint, unspecified: Secondary | ICD-10-CM | POA: Diagnosis not present

## 2022-08-14 DIAGNOSIS — F03911 Unspecified dementia, unspecified severity, with agitation: Secondary | ICD-10-CM | POA: Diagnosis not present

## 2022-08-14 DIAGNOSIS — E1122 Type 2 diabetes mellitus with diabetic chronic kidney disease: Secondary | ICD-10-CM | POA: Diagnosis not present

## 2022-08-14 DIAGNOSIS — N179 Acute kidney failure, unspecified: Secondary | ICD-10-CM | POA: Diagnosis not present

## 2022-08-21 DIAGNOSIS — R338 Other retention of urine: Secondary | ICD-10-CM | POA: Diagnosis not present

## 2022-08-21 DIAGNOSIS — N130 Hydronephrosis with ureteropelvic junction obstruction: Secondary | ICD-10-CM | POA: Diagnosis not present

## 2022-08-25 DIAGNOSIS — Z8781 Personal history of (healed) traumatic fracture: Secondary | ICD-10-CM | POA: Diagnosis not present

## 2022-08-25 DIAGNOSIS — N1330 Unspecified hydronephrosis: Secondary | ICD-10-CM | POA: Diagnosis not present

## 2022-08-25 DIAGNOSIS — M6281 Muscle weakness (generalized): Secondary | ICD-10-CM | POA: Diagnosis not present

## 2022-09-04 DIAGNOSIS — D631 Anemia in chronic kidney disease: Secondary | ICD-10-CM | POA: Diagnosis not present

## 2022-09-04 DIAGNOSIS — F03911 Unspecified dementia, unspecified severity, with agitation: Secondary | ICD-10-CM | POA: Diagnosis not present

## 2022-09-04 DIAGNOSIS — M8580 Other specified disorders of bone density and structure, unspecified site: Secondary | ICD-10-CM | POA: Diagnosis not present

## 2022-09-04 DIAGNOSIS — S72401D Unspecified fracture of lower end of right femur, subsequent encounter for closed fracture with routine healing: Secondary | ICD-10-CM | POA: Diagnosis not present

## 2022-09-04 DIAGNOSIS — N179 Acute kidney failure, unspecified: Secondary | ICD-10-CM | POA: Diagnosis not present

## 2022-09-04 DIAGNOSIS — M189 Osteoarthritis of first carpometacarpal joint, unspecified: Secondary | ICD-10-CM | POA: Diagnosis not present

## 2022-09-04 DIAGNOSIS — M1030 Gout due to renal impairment, unspecified site: Secondary | ICD-10-CM | POA: Diagnosis not present

## 2022-09-04 DIAGNOSIS — J9601 Acute respiratory failure with hypoxia: Secondary | ICD-10-CM | POA: Diagnosis not present

## 2022-09-04 DIAGNOSIS — K219 Gastro-esophageal reflux disease without esophagitis: Secondary | ICD-10-CM | POA: Diagnosis not present

## 2022-09-04 DIAGNOSIS — E875 Hyperkalemia: Secondary | ICD-10-CM | POA: Diagnosis not present

## 2022-09-04 DIAGNOSIS — E872 Acidosis, unspecified: Secondary | ICD-10-CM | POA: Diagnosis not present

## 2022-09-04 DIAGNOSIS — I5032 Chronic diastolic (congestive) heart failure: Secondary | ICD-10-CM | POA: Diagnosis not present

## 2022-09-04 DIAGNOSIS — N3080 Other cystitis without hematuria: Secondary | ICD-10-CM | POA: Diagnosis not present

## 2022-09-04 DIAGNOSIS — G9340 Encephalopathy, unspecified: Secondary | ICD-10-CM | POA: Diagnosis not present

## 2022-09-04 DIAGNOSIS — J9811 Atelectasis: Secondary | ICD-10-CM | POA: Diagnosis not present

## 2022-09-04 DIAGNOSIS — M9701XD Periprosthetic fracture around internal prosthetic right hip joint, subsequent encounter: Secondary | ICD-10-CM | POA: Diagnosis not present

## 2022-09-04 DIAGNOSIS — N184 Chronic kidney disease, stage 4 (severe): Secondary | ICD-10-CM | POA: Diagnosis not present

## 2022-09-04 DIAGNOSIS — B952 Enterococcus as the cause of diseases classified elsewhere: Secondary | ICD-10-CM | POA: Diagnosis not present

## 2022-09-04 DIAGNOSIS — I700 Atherosclerosis of aorta: Secondary | ICD-10-CM | POA: Diagnosis not present

## 2022-09-04 DIAGNOSIS — I130 Hypertensive heart and chronic kidney disease with heart failure and stage 1 through stage 4 chronic kidney disease, or unspecified chronic kidney disease: Secondary | ICD-10-CM | POA: Diagnosis not present

## 2022-09-04 DIAGNOSIS — N139 Obstructive and reflux uropathy, unspecified: Secondary | ICD-10-CM | POA: Diagnosis not present

## 2022-09-04 DIAGNOSIS — F03918 Unspecified dementia, unspecified severity, with other behavioral disturbance: Secondary | ICD-10-CM | POA: Diagnosis not present

## 2022-09-04 DIAGNOSIS — E1122 Type 2 diabetes mellitus with diabetic chronic kidney disease: Secondary | ICD-10-CM | POA: Diagnosis not present

## 2022-09-04 DIAGNOSIS — F0392 Unspecified dementia, unspecified severity, with psychotic disturbance: Secondary | ICD-10-CM | POA: Diagnosis not present

## 2022-09-05 DIAGNOSIS — S72451D Displaced supracondylar fracture without intracondylar extension of lower end of right femur, subsequent encounter for closed fracture with routine healing: Secondary | ICD-10-CM | POA: Diagnosis not present

## 2022-09-06 DIAGNOSIS — N3080 Other cystitis without hematuria: Secondary | ICD-10-CM | POA: Diagnosis not present

## 2022-09-11 ENCOUNTER — Ambulatory Visit (INDEPENDENT_AMBULATORY_CARE_PROVIDER_SITE_OTHER): Payer: PPO | Admitting: Podiatry

## 2022-09-11 DIAGNOSIS — B351 Tinea unguium: Secondary | ICD-10-CM

## 2022-09-11 DIAGNOSIS — M79675 Pain in left toe(s): Secondary | ICD-10-CM | POA: Diagnosis not present

## 2022-09-11 DIAGNOSIS — M79674 Pain in right toe(s): Secondary | ICD-10-CM

## 2022-09-11 DIAGNOSIS — E118 Type 2 diabetes mellitus with unspecified complications: Secondary | ICD-10-CM | POA: Diagnosis not present

## 2022-09-11 DIAGNOSIS — L600 Ingrowing nail: Secondary | ICD-10-CM

## 2022-09-11 NOTE — Patient Instructions (Signed)
Soak Instructions    THE DAY AFTER THE PROCEDURE  Place 1/4 cup of epsom salts (or betadine, or white vinegar) in a quart of warm tap water.  Submerge your foot or feet with outer bandage intact for the initial soak; this will allow the bandage to become moist and wet for easy lift off.  Once you remove your bandage, continue to soak in the solution for 20 minutes.  This soak should be done twice a day.  Next, remove your foot or feet from solution, blot dry the affected area and cover.  You may use a band aid large enough to cover the area or use gauze and tape.  Apply other medications to the area as directed by the doctor such as polysporin neosporin.  IF YOUR SKIN BECOMES IRRITATED WHILE USING THESE INSTRUCTIONS, IT IS OKAY TO SWITCH TO  WHITE VINEGAR AND WATER. Or you may use antibacterial soap and water to keep the toe clean  Monitor for any signs/symptoms of infection. Call the office immediately if any occur or go directly to the emergency room. Call with any questions/concerns.   Do this for the next week at least once a day. Apply antibiotic ointment or neosporin and a bandaid for 1 week

## 2022-09-11 NOTE — Progress Notes (Signed)
  Subjective:  Patient ID: [name], female    DOB: [birth year],  MRN: [id number]  Chief Complaint  Patient presents with   Diabetes    Diabetic foot care    89 y.o. female presents with the above complaint. History confirmed with patient.  She says her blood sugars well-controlled.  Her nails are thickened elongated and causing pain.  The worst of which is the right great toenail inside edge  Objective:  Physical Exam: warm, good capillary refill, no trophic changes or ulcerative lesions, normal DP and PT pulses, and normal sensory exam. Left Foot:  Thickened elongated brown-yellow discolored nails with subungual debris x 5 Right Foot:  Thickened elongated brown-yellow discolored nails with subungual debris x 5, medial hallux nail ingrown incurvated with slight purulence at distal tip   Assessment:   1. Pain due to onychomycosis of toenails of both feet   2. Ingrowing right great toenail   3. Type 2 diabetes mellitus with complication, without long-term current use of insulin ([place])      Plan:  Patient was evaluated and treated and all questions answered.   Discussed the etiology and treatment options for the condition in detail with the patient. Educated patient on the treatment options for mycotic nails. Recommended debridement of the nails today. Sharp and mechanical debridement performed of all painful and mycotic nails today.  Right hallux nail medial border required partial slant back avulsion and required local anesthetic to complete this due to pain.  It was tolerated well.  I recommended twice daily Epsom salt soaks with antibiotic ointment and Band-Aid for the next week.  Remaining nails debrided in length and thickness using a nail nipper to level of comfort. Discussed treatment options including appropriate shoe gear. Follow up as needed for painful nails.    Return in about 3 months (around 12/10/2022) for at risk diabetic foot care.

## 2022-09-17 DIAGNOSIS — R197 Diarrhea, unspecified: Secondary | ICD-10-CM | POA: Diagnosis not present

## 2022-09-17 DIAGNOSIS — E872 Acidosis, unspecified: Secondary | ICD-10-CM | POA: Diagnosis not present

## 2022-09-17 DIAGNOSIS — M8580 Other specified disorders of bone density and structure, unspecified site: Secondary | ICD-10-CM | POA: Diagnosis not present

## 2022-09-17 DIAGNOSIS — M1030 Gout due to renal impairment, unspecified site: Secondary | ICD-10-CM | POA: Diagnosis not present

## 2022-09-17 DIAGNOSIS — M9701XD Periprosthetic fracture around internal prosthetic right hip joint, subsequent encounter: Secondary | ICD-10-CM | POA: Diagnosis not present

## 2022-09-17 DIAGNOSIS — N139 Obstructive and reflux uropathy, unspecified: Secondary | ICD-10-CM | POA: Diagnosis not present

## 2022-09-17 DIAGNOSIS — F0392 Unspecified dementia, unspecified severity, with psychotic disturbance: Secondary | ICD-10-CM | POA: Diagnosis not present

## 2022-09-17 DIAGNOSIS — M189 Osteoarthritis of first carpometacarpal joint, unspecified: Secondary | ICD-10-CM | POA: Diagnosis not present

## 2022-09-17 DIAGNOSIS — F03911 Unspecified dementia, unspecified severity, with agitation: Secondary | ICD-10-CM | POA: Diagnosis not present

## 2022-09-17 DIAGNOSIS — J9811 Atelectasis: Secondary | ICD-10-CM | POA: Diagnosis not present

## 2022-09-17 DIAGNOSIS — J9601 Acute respiratory failure with hypoxia: Secondary | ICD-10-CM | POA: Diagnosis not present

## 2022-09-17 DIAGNOSIS — I130 Hypertensive heart and chronic kidney disease with heart failure and stage 1 through stage 4 chronic kidney disease, or unspecified chronic kidney disease: Secondary | ICD-10-CM | POA: Diagnosis not present

## 2022-09-17 DIAGNOSIS — I700 Atherosclerosis of aorta: Secondary | ICD-10-CM | POA: Diagnosis not present

## 2022-09-17 DIAGNOSIS — B952 Enterococcus as the cause of diseases classified elsewhere: Secondary | ICD-10-CM | POA: Diagnosis not present

## 2022-09-17 DIAGNOSIS — D631 Anemia in chronic kidney disease: Secondary | ICD-10-CM | POA: Diagnosis not present

## 2022-09-17 DIAGNOSIS — S72401D Unspecified fracture of lower end of right femur, subsequent encounter for closed fracture with routine healing: Secondary | ICD-10-CM | POA: Diagnosis not present

## 2022-09-17 DIAGNOSIS — F03918 Unspecified dementia, unspecified severity, with other behavioral disturbance: Secondary | ICD-10-CM | POA: Diagnosis not present

## 2022-09-17 DIAGNOSIS — N390 Urinary tract infection, site not specified: Secondary | ICD-10-CM | POA: Diagnosis not present

## 2022-09-17 DIAGNOSIS — N179 Acute kidney failure, unspecified: Secondary | ICD-10-CM | POA: Diagnosis not present

## 2022-09-17 DIAGNOSIS — K219 Gastro-esophageal reflux disease without esophagitis: Secondary | ICD-10-CM | POA: Diagnosis not present

## 2022-09-17 DIAGNOSIS — R5383 Other fatigue: Secondary | ICD-10-CM | POA: Diagnosis not present

## 2022-09-17 DIAGNOSIS — I5032 Chronic diastolic (congestive) heart failure: Secondary | ICD-10-CM | POA: Diagnosis not present

## 2022-09-17 DIAGNOSIS — N184 Chronic kidney disease, stage 4 (severe): Secondary | ICD-10-CM | POA: Diagnosis not present

## 2022-09-17 DIAGNOSIS — G9340 Encephalopathy, unspecified: Secondary | ICD-10-CM | POA: Diagnosis not present

## 2022-09-17 DIAGNOSIS — N3080 Other cystitis without hematuria: Secondary | ICD-10-CM | POA: Diagnosis not present

## 2022-09-17 DIAGNOSIS — E875 Hyperkalemia: Secondary | ICD-10-CM | POA: Diagnosis not present

## 2022-09-17 DIAGNOSIS — F0390 Unspecified dementia without behavioral disturbance: Secondary | ICD-10-CM | POA: Diagnosis not present

## 2022-09-17 DIAGNOSIS — E1122 Type 2 diabetes mellitus with diabetic chronic kidney disease: Secondary | ICD-10-CM | POA: Diagnosis not present

## 2022-09-18 ENCOUNTER — Ambulatory Visit: Payer: PPO | Admitting: Physical Therapy

## 2022-09-18 DIAGNOSIS — N390 Urinary tract infection, site not specified: Secondary | ICD-10-CM | POA: Diagnosis not present

## 2022-09-25 DIAGNOSIS — M6281 Muscle weakness (generalized): Secondary | ICD-10-CM | POA: Diagnosis not present

## 2022-09-25 DIAGNOSIS — Z8781 Personal history of (healed) traumatic fracture: Secondary | ICD-10-CM | POA: Diagnosis not present

## 2022-09-25 DIAGNOSIS — N1330 Unspecified hydronephrosis: Secondary | ICD-10-CM | POA: Diagnosis not present

## 2022-09-27 NOTE — Therapy (Signed)
OUTPATIENT PHYSICAL THERAPY LOWER EXTREMITY EVALUATION   Patient Name: [name] MRN: [id number] DOB:19-Jan-1929, 89 y.o., female Today's Date: 09/27/2022  END OF SESSION:   Past Medical History:  Diagnosis Date   Dementia ([place])    Diabetes mellitus    GERD (gastroesophageal reflux disease)    Glaucoma    Heart disease    Hypertension    Tinnitus    Past Surgical History:  Procedure Laterality Date   BREAST LUMPECTOMY     bil for breast ca   ORIF FEMUR FRACTURE Right 07/03/2022   Procedure: OPEN REDUCTION INTERNAL FIXATION (ORIF) DISTAL FEMUR FRACTURE;  Surgeon: Altamese [place], MD;  Location: [facility];  Service: Orthopedics;  Laterality: Right;   Patient Active Problem List   Diagnosis Date Noted   Severe pulmonary hypertension ([place]) 08/01/2022   Bacteriuria with pyuria 123456   Complicated UTI (urinary tract infection) 07/31/2022   Bilateral hydronephrosis 07/23/2022   Delirium 07/22/2022   History of femur fracture 07/22/2022   Essential hypertension 07/22/2022   Urinary retention 07/22/2022   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   Acute urinary retention 07/17/2022   Complex renal cyst 07/17/2022   Closed fracture of right distal femur ([place]) 06/30/2022   Chronic diastolic CHF (congestive heart failure) ([place]) 06/30/2022   history of seizures 06/30/2022   Dementia ([place]) 03/10/2022   Heart failure ([place]) 03/10/2022   Acute renal failure superimposed on stage 4 chronic kidney disease ([place]) 03/10/2022   Type 2 diabetes mellitus with complication, without long-term current use of insulin ([place])    Glaucoma    Hyperglycemia 11/24/2020   UTI (urinary tract infection) 11/24/2020   Acute encephalopathy 11/24/2020   GERD (gastroesophageal reflux disease)    Hypertension     PCP: Seward Carol, MD  REFERRING PROVIDER: Altamese Henning, MD   REFERRING DIAG: RIGHT PERIPROSTHETIC SUPRACONDYLAR FEMUR FX   THERAPY DIAG:  No diagnosis found.  Rationale for Evaluation and  Treatment: Rehabilitation  ONSET DATE: 11/27 for fx; surgery for 07/04/23  SUBJECTIVE:   SUBJECTIVE STATEMENT: With pt and daughter, [name] Pt fx her R knee due to a fall and underwent surgical repair in early December. Pt participated in [place] from early Jan to mid Feb. Pt's functional progress has been delayed by an UTI and norovirus the 1st and 2nd weeks of Feb. Currently, the pt is walking short distances in her home mod Ind with a RW, needs CGA outside her home c a RW or uses a transport chair, and uses a ramp to access her home. Currently, she is not using steps. Previously, she was Ind for mobility in her home, used a SPC c mod Ind outside her home, and received assistance with steps. Pt has STM deficits, daughter reports she is oriented to person, place, and situation. Has has assistance from family in home and outside caregivers.  PERTINENT HISTORY: Dementia, DM, HOH, Heart disease, HTN  PAIN:  Are you having pain? Yes: NPRS scale: 5/10 Pain location: R knee Pain description: ache, intermittent- esp at night Aggravating factors: at night Relieving factors: tylenol Currently: 0/10  PRECAUTIONS: None  WEIGHT BEARING RESTRICTIONS: No  FALLS:  Has patient fallen in last 6 months? Yes. Number of falls 1  LIVING ENVIRONMENT: Lives with: lives with their family Lives in: House/apartment Stairs: Yes: Internal: 13 steps; on right going uplives on 1st, does not use steps. Ramp to access home, would like to use steps as appropriate. Has following equipment at home: Gilford Rile - 2 wheeled, shower chair, Shower bench,  and walk in shower  BSC  OCCUPATION: Retired  PLOF: Independent with gait  PATIENT GOALS: To improve strength, gait tolerance, and to walk with a SPC   OBJECTIVE:   DIAGNOSTIC FINDINGS: See Epic  PATIENT SURVEYS:  FOTO: Perceived function  25%, predicted  45%   COGNITION: Overall cognitive status: Impaired, daughter reports oriented to person, place and situation.  STM deficits.     SENSATION: WFL  EDEMA:  Min edema R knee  MUSCLE LENGTH: Hamstrings: Right NT deg; Left NT deg Thomas test: Right NT deg; Left NT deg  POSTURE: rounded shoulders, forward head, decreased lumbar lordosis, increased thoracic kyphosis, and flexed trunk   PALPATION: Not TTP  LOWER EXTREMITY ROM:  Active ROM Right eval Left eval  Hip flexion    Hip extension    Hip abduction    Hip adduction    Hip internal rotation    Hip external rotation    Knee flexion 93 118  Knee extension 0 0  Ankle dorsiflexion    Ankle plantarflexion    Ankle inversion    Ankle eversion     (Blank rows = not tested) 12  12 LOWER EXTREMITY MMT:  MMT Right eval Left eval  Hip flexion 3- 3  Hip extension 2+ 3  Hip abduction 3- 3  Hip adduction  3  Hip internal rotation    Hip external rotation 3 3  Knee flexion 4 4+  Knee extension 4 4+  Ankle dorsiflexion    Ankle plantarflexion    Ankle inversion    Ankle eversion     Min extension lag bilat (Blank rows = not tested)  LOWER EXTREMITY SPECIAL TESTS:  NT  FUNCTIONAL TESTS:  5 times sit to stand: TBA 2 minute walk test: TBA  GAIT: Distance walked: 30' Assistive device utilized: Environmental consultant - 2 wheeled Level of assistance: CGA Comments: Step to pattern L  TRANSFER:  Min A from mat table, Ind c use of hands from higher chairs and bed at home   TODAY'S TREATMENT:                                                                                                                               [facility] Adult PT Treatment:                                                DATE: 09/28/22 Eval  PATIENT EDUCATION:  Education details: Eval findings, POC Person educated: Patient and Child(ren) Education method: Explanation Education comprehension: verbalized understanding  HOME EXERCISE PROGRAM: To be established. Pt has a HEP provided by HHPT, but has not been completing after bouts of a UTI and  norovirus  ASSESSMENT:  CLINICAL IMPRESSION: Patient is a 89 y.o. emale who was seen today for physical therapy evaluation and treatment for  RIGHT PERIPROSTHETIC SUPRACONDYLAR FEMUR .   OBJECTIVE IMPAIRMENTS: decreased activity tolerance, decreased balance, decreased cognition, decreased coordination, decreased endurance, decreased mobility, difficulty walking, decreased ROM, decreased strength, increased edema, and postural dysfunction.   ACTIVITY LIMITATIONS: carrying, lifting, bending, sitting, standing, squatting, stairs, transfers, bathing, dressing, hygiene/grooming, and locomotion level  PARTICIPATION LIMITATIONS: community activity and church  PERSONAL FACTORS: Age, Fitness, Past/current experiences, Time since onset of injury/illness/exacerbation, and 3+ comorbidities: Dementia, DM, HOH, Heart disease, HTN  are also affecting patient's functional outcome.   REHAB POTENTIAL: Good  CLINICAL DECISION MAKING: Evolving/moderate complexity  EVALUATION COMPLEXITY: Moderate   GOALS:  SHORT TERM GOALS: Target date: 10/19/22  Pt will be Ind in an initial HEP  Baseline:To be established Goal status: INITIAL  2.  Increase R knee AROM to 100d  Baseline: 93d Goal status: INITIAL  LONG TERM GOALS: Target date: 11/30/22  Pt will be Ind in a final HEP to maintain achieved LOF  Baseline: To be established Goal status: INITIAL  2.  Increase R knee flexion to 105d for improved function c sitting and using steps Baseline: 93d Goal status: INITIAL  3.  Increase R hip strength to 4/5 and knee strength to 4+/5 for improved independence and safety c ambulation Baseline: see flow sheets Goal status: INITIAL  4.  Improve 5xSTS by MCID of 5" and 2MWT by MCID of 66f as indication of improved functional mobility  Baseline: TBA Goal status: INITIAL  5.  Pt's FOTO score will improved to the predicted value of 45% as indication of improved function  Baseline: 25% Goal status:  INITIAL  6.  Pt will progress to mod Ind ambulation of 2071fc an appropriate AD for safe home and community ambulation. Baseline: 2073f RW and CGA Goal status: INITIAL  6.  Pt will progress to CGA to ascend/descend 12 steps c CGA to access home and for community mobility. Baseline: 80f43fRW and CGA Goal status: INITIAL   PLAN:  PT FREQUENCY: 2x/week  PT DURATION: 8 weeks  PLANNED INTERVENTIONS: Therapeutic exercises, Therapeutic activity, Balance training, Gait training, Patient/Family education, Self Care, Joint mobilization, Stair training, Aquatic Therapy, Dry Needling, Electrical stimulation, Cryotherapy, Moist heat, Taping, Vasopneumatic device, Ultrasound, Ionotophoresis '4mg'$ /ml Dexamethasone, Manual therapy, and Re-evaluation  PLAN FOR NEXT SESSION: Review FOTO; assess response to HEP; progress therex as indicated; use of modalities, manual therapy; and TPDN as indicated.  Juletta Berhe MS, PT 09/29/22 9:42 AM

## 2022-09-28 ENCOUNTER — Ambulatory Visit: Payer: PPO | Attending: Orthopedic Surgery

## 2022-09-28 ENCOUNTER — Other Ambulatory Visit: Payer: Self-pay

## 2022-09-28 DIAGNOSIS — R262 Difficulty in walking, not elsewhere classified: Secondary | ICD-10-CM | POA: Insufficient documentation

## 2022-09-28 DIAGNOSIS — M25561 Pain in right knee: Secondary | ICD-10-CM | POA: Diagnosis not present

## 2022-09-28 DIAGNOSIS — G8929 Other chronic pain: Secondary | ICD-10-CM

## 2022-09-28 DIAGNOSIS — M6281 Muscle weakness (generalized): Secondary | ICD-10-CM | POA: Diagnosis not present

## 2022-10-03 DIAGNOSIS — S72451D Displaced supracondylar fracture without intracondylar extension of lower end of right femur, subsequent encounter for closed fracture with routine healing: Secondary | ICD-10-CM | POA: Diagnosis not present

## 2022-10-05 DIAGNOSIS — N3020 Other chronic cystitis without hematuria: Secondary | ICD-10-CM | POA: Diagnosis not present

## 2022-10-17 ENCOUNTER — Encounter: Payer: Self-pay | Admitting: Physical Therapy

## 2022-10-17 ENCOUNTER — Ambulatory Visit: Payer: PPO | Admitting: Physical Therapy

## 2022-10-17 DIAGNOSIS — R262 Difficulty in walking, not elsewhere classified: Secondary | ICD-10-CM | POA: Diagnosis not present

## 2022-10-17 DIAGNOSIS — M6281 Muscle weakness (generalized): Secondary | ICD-10-CM

## 2022-10-17 DIAGNOSIS — G8929 Other chronic pain: Secondary | ICD-10-CM

## 2022-10-17 NOTE — Therapy (Signed)
OUTPATIENT PHYSICAL THERAPY TREATMENT NOTE   Patient Name: [name] MRN: [id number] DOB:02-Jun-1929, 89 y.o., female Today's Date: 10/17/2022  PCP: Delfina Redwood  REFERRING PROVIDER: Seward Carol MD   END OF SESSION:   PT End of Session - 10/17/22 1317     Visit Number 2    Number of Visits 17    Date for PT Re-Evaluation 11/30/22    Authorization Type [employer]    PT Start Time 1320    PT Stop Time 1402    PT Time Calculation (min) 42 min    Activity Tolerance Patient tolerated treatment well    Behavior During Therapy WFL for tasks assessed/performed             Past Medical History:  Diagnosis Date   Dementia ([place])    Diabetes mellitus    GERD (gastroesophageal reflux disease)    Glaucoma    Heart disease    Hypertension    Tinnitus    Past Surgical History:  Procedure Laterality Date   BREAST LUMPECTOMY     bil for breast ca   ORIF FEMUR FRACTURE Right 07/03/2022   Procedure: OPEN REDUCTION INTERNAL FIXATION (ORIF) DISTAL FEMUR FRACTURE;  Surgeon: Altamese [place], MD;  Location: [facility];  Service: Orthopedics;  Laterality: Right;   Patient Active Problem List   Diagnosis Date Noted   Severe pulmonary hypertension ([place]) 08/01/2022   Bacteriuria with pyuria 123456   Complicated UTI (urinary tract infection) 07/31/2022   Bilateral hydronephrosis 07/23/2022   Delirium 07/22/2022   History of femur fracture 07/22/2022   Essential hypertension 07/22/2022   Urinary retention 07/22/2022   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   Acute urinary retention 07/17/2022   Complex renal cyst 07/17/2022   Closed fracture of right distal femur ([place]) 06/30/2022   Chronic diastolic CHF (congestive heart failure) ([place]) 06/30/2022   history of seizures 06/30/2022   Dementia ([place]) 03/10/2022   Heart failure ([place]) 03/10/2022   Acute renal failure superimposed on stage 4 chronic kidney disease ([place]) 03/10/2022   Type 2 diabetes mellitus with complication, without  long-term current use of insulin ([facility])    Glaucoma    Hyperglycemia 11/24/2020   UTI (urinary tract infection) 11/24/2020   Acute encephalopathy 11/24/2020   GERD (gastroesophageal reflux disease)    Hypertension     REFERRING DIAG: Distal femur fracture  R  THERAPY DIAG:  Difficulty in walking, not elsewhere classified  Muscle weakness (generalized)  Chronic pain of right knee  Rationale for Evaluation and Treatment Rehabilitation  PERTINENT HISTORY: see above   PRECAUTIONS:   SUBJECTIVE:  SUBJECTIVE STATEMENT:  Pt arrives with daughter, no complaints.  Reports pin with walking at home.  No exercises given from [facility] but has some from [place]. Pt's daughter concerned that she cannot go up and down steps.    PAIN:  Are you having pain? Yes: NPRS scale: not really/10 Pain location: Rt leg  Pain description: none  Aggravating factors: walking  Relieving factors: rest    OBJECTIVE:  DIAGNOSTIC FINDINGS: See Epic   PATIENT SURVEYS:  FOTO: Perceived function  25%, predicted  45%    COGNITION: Overall cognitive status: Impaired, daughter reports oriented to person, place and situation. STM deficits.   Daughter Colletta [place] reports her vocalizations during walking and exercise are more behavioral than indicative of pain .             SENSATION: WFL   EDEMA:  Min edema R knee   MUSCLE LENGTH: Hamstrings: Right NT deg; Left NT deg Marcello Moores test: Right NT deg; Left NT deg   POSTURE: rounded shoulders, forward head, decreased lumbar lordosis, increased thoracic kyphosis, and flexed trunk    PALPATION: Not TTP   LOWER EXTREMITY ROM:   Active ROM Right eval Left eval  Hip flexion      Hip extension      Hip abduction      Hip adduction      Hip internal rotation      Hip external rotation       Knee flexion 93 118  Knee extension 0 0  Ankle dorsiflexion      Ankle plantarflexion      Ankle inversion      Ankle eversion       (Blank rows = not tested)  LOWER EXTREMITY MMT:   MMT Right eval Left eval  Hip flexion 3- 3  Hip extension 2+ 3  Hip abduction 3- 3  Hip adduction   3  Hip internal rotation      Hip external rotation 3 3  Knee flexion 4 4+  Knee extension 4 4+  Ankle dorsiflexion      Ankle plantarflexion      Ankle inversion      Ankle eversion       Min extension lag bilat (Blank rows = not tested)   LOWER EXTREMITY SPECIAL TESTS:  NT   FUNCTIONAL TESTS:  5 times sit to stand: NT  10/17/22: 2 minute walk test: 94 feet , pain 7/10 in R knee, had to sit down at this point due to pain and fatigue    GAIT: Distance walked: 30' Assistive device utilized: Environmental consultant - 2 wheeled Level of assistance: CGA Comments: Step to pattern L   TRANSFER:            Min A from mat table, Ind c use of hands from higher chairs and bed at home     TODAY'S TREATMENT:  [facility] Adult PT Treatment:                                                DATE: 10/17/22 Therapeutic Exercise: Seated LAQ 2 x 15 Hamstring curl Hip flexion/march  Hip abd green band x 15  Sit to stand x 10 elevated mat  Heel raises x 15  2 min walk test with CGA  Supine SAQ  x 15  SLR 2 x 10 with PT assist for  quad lag Bridging 2 x 10                                                                                                                                [facility] Adult PT Treatment:                                                DATE: 09/28/22 Eval   PATIENT EDUCATION:  Education details: Eval findings, POC Person educated: Patient and Child(ren) Education method: Explanation Education comprehension: verbalized understanding   HOME EXERCISE PROGRAM:  Access Code: [email or web address] URL: [email or web address] Date: 10/17/2022 Prepared by: Raeford Razor  Exercises - Supine Quad Set  - 1 x  daily - 7 x weekly - 2 sets - 10 reps - 5 hold - Hooklying Active Hamstring Stretch  - 1 x daily - 7 x weekly - 2 sets - 10 reps - Beginner Bridge  - 1 x daily - 7 x weekly - 2 sets - 10 reps - 5 hold - Sit to Stand with Counter Support  - 1 x daily - 7 x weekly - 2 sets - 10 reps - Active Straight Leg Raise with Quad Set  - 1 x daily - 7 x weekly - 2 sets - 10 reps - 2-3 hold ASSESSMENT:   CLINICAL IMPRESSION: Patient tolerated session for bilateral lower extremity strength and with only minimal difficulty.  Exercises did not aggravate her leg.  She fatigues quickly with supine exercises and was unable to complete 2 min walk due to pain and fatigue.  She was given HEP and spoke to [place] (caregiver) about doing them regularly.    OBJECTIVE IMPAIRMENTS: decreased activity tolerance, decreased balance, decreased cognition, decreased coordination, decreased endurance, decreased mobility, difficulty walking, decreased ROM, decreased strength, increased edema, and postural dysfunction.    ACTIVITY LIMITATIONS: carrying, lifting, bending, sitting, standing, squatting, stairs, transfers, bathing, dressing, hygiene/grooming, and locomotion level   PARTICIPATION LIMITATIONS: community activity and church   PERSONAL FACTORS: Age, Fitness, Past/current experiences, Time since onset of injury/illness/exacerbation, and 3+ comorbidities: Dementia, DM, HOH, Heart disease, HTN  are also affecting patient's functional outcome.    REHAB POTENTIAL: Good   CLINICAL DECISION MAKING: Evolving/moderate complexity   EVALUATION COMPLEXITY: Moderate     GOALS:   SHORT TERM GOALS: Target date: 10/19/22   Pt will be Ind in an initial HEP  Baseline:To be established, given on eval  Goal status: INITIAL   2.  Increase R knee AROM to 100d  Baseline: 93d Goal status: INITIAL   LONG TERM GOALS: Target date: 11/30/22   Pt will be Ind in a final HEP to maintain achieved LOF  Baseline: To be established Goal  status: INITIAL   2.  Increase R knee flexion to 105d for improved function c sitting and using  steps Baseline: 93d Goal status: INITIAL   3.  Increase R hip strength to 4/5 and knee strength to 4+/5 for improved independence and safety c ambulation Baseline: see flow sheets Goal status: INITIAL   4.  Improve 5xSTS by MCID of 5" and 2MWT by MCID of 4ft as indication of improved functional mobility  Baseline: TBA Goal status: INITIAL   5.  Pt's FOTO score will improved to the predicted value of 45% as indication of improved function  Baseline: 25% Goal status: INITIAL   6.  Pt will progress to mod Ind ambulation of 255ft c an appropriate AD for safe home and community ambulation. Baseline: 22ft c RW and CGA Goal status: INITIAL   6.  Pt will progress to CGA to ascend/descend 12 steps c CGA to access home and for community mobility. Baseline: 38ft c RW and CGA Goal status: INITIAL     PLAN:   PT FREQUENCY: 2x/week   PT DURATION: 8 weeks   PLANNED INTERVENTIONS: Therapeutic exercises, Therapeutic activity, Balance training, Gait training, Patient/Family education, Self Care, Joint mobilization, Stair training, Aquatic Therapy, Dry Needling, Electrical stimulation, Cryotherapy, Moist heat, Taping, Vasopneumatic device, Ultrasound, Ionotophoresis 4mg /ml Dexamethasone, Manual therapy, and Re-evaluation   PLAN FOR NEXT SESSION: check HEP, Nustep.  Sit to stand and there ex as tol.   Clarence Cogswell, PT 10/17/2022, 2:12 PM    Raeford Razor, PT 10/17/22 2:12 PM Phone: [phone] Fax: [phone]

## 2022-10-22 NOTE — Therapy (Unsigned)
OUTPATIENT PHYSICAL THERAPY TREATMENT NOTE   Patient Name: [name] MRN: [id number] DOB:[birth year], 89 y.o., female Today's Date: 10/23/2022  PCP: Delfina Redwood  REFERRING PROVIDER: Seward Carol MD   END OF SESSION:   PT End of Session - 10/23/22 1244     Visit Number 3    Number of Visits 17    Date for PT Re-Evaluation 11/30/22    Authorization Type [employer]    PT Start Time [id number]    PT Stop Time 1316    PT Time Calculation (min) 42 min    Equipment Utilized During Treatment Gait belt;Other (comment)    Activity Tolerance Patient tolerated treatment well;Patient limited by fatigue;Patient limited by pain    Behavior During Therapy [facility] for tasks assessed/performed              Past Medical History:  Diagnosis Date   Dementia ([place])    Diabetes mellitus    GERD (gastroesophageal reflux disease)    Glaucoma    Heart disease    Hypertension    Tinnitus    Past Surgical History:  Procedure Laterality Date   BREAST LUMPECTOMY     bil for breast ca   ORIF FEMUR FRACTURE Right 07/03/2022   Procedure: OPEN REDUCTION INTERNAL FIXATION (ORIF) DISTAL FEMUR FRACTURE;  Surgeon: Altamese Honaunau-[place], MD;  Location: [place];  Service: Orthopedics;  Laterality: Right;   Patient Active Problem List   Diagnosis Date Noted   Severe pulmonary hypertension ([place]) 08/01/2022   Bacteriuria with pyuria 123456   Complicated UTI (urinary tract infection) 07/31/2022   Bilateral hydronephrosis 07/23/2022   Delirium 07/22/2022   History of femur fracture 07/22/2022   Essential hypertension 07/22/2022   Urinary retention 07/22/2022   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) 06/30/2022   history of seizures 06/30/2022   Dementia ([place]) 03/10/2022   Heart failure ([place]) 03/10/2022   Acute renal failure  superimposed on stage 4 chronic kidney disease ([place]) 03/10/2022   Type 2 diabetes mellitus with complication, without long-term current use of insulin (HCC)    Glaucoma    Hyperglycemia 11/24/2020   UTI (urinary tract infection) 11/24/2020   Acute encephalopathy 11/24/2020   GERD (gastroesophageal reflux disease)    Hypertension     REFERRING DIAG: Distal femur fracture  R  THERAPY DIAG:  Difficulty in walking, not elsewhere classified  Muscle weakness (generalized)  Chronic pain of right knee  Rationale for Evaluation and Treatment Rehabilitation  PERTINENT HISTORY: see above   PRECAUTIONS:   SUBJECTIVE:  SUBJECTIVE STATEMENT: Have not done my exercises.  I am stiff all over.  patient arrives sitting in her rollator walker.  Did not feel like walking in per her caregiver.  PAIN:  Are you having pain? Yes: NPRS scale: not really/10 Pain location: Rt leg  Pain description: none  Aggravating factors: walking  Relieving factors: rest    OBJECTIVE:  DIAGNOSTIC FINDINGS: See Epic   PATIENT SURVEYS:  FOTO: Perceived function  25%, predicted  45%    COGNITION: Overall cognitive status: Impaired, daughter reports oriented to person, place and situation. STM deficits.   Daughter also reports her vocalizations during walking and exercise are more behavioral than indicative of pain .             SENSATION: WFL   EDEMA:  Min edema R knee   MUSCLE LENGTH: Hamstrings: Right NT deg; Left NT deg Marcello Moores test: Right NT deg; Left NT deg   POSTURE: rounded shoulders, forward head, decreased lumbar lordosis, increased thoracic kyphosis, and flexed trunk    PALPATION: Not TTP   LOWER EXTREMITY ROM:   Active ROM Right eval Left eval  Hip flexion      Hip extension      Hip abduction      Hip  adduction      Hip internal rotation      Hip external rotation      Knee flexion 93 118  Knee extension 0 0  Ankle dorsiflexion      Ankle plantarflexion      Ankle inversion      Ankle eversion       (Blank rows = not tested)  LOWER EXTREMITY MMT:   MMT Right eval Left eval  Hip flexion 3- 3  Hip extension 2+ 3  Hip abduction 3- 3  Hip adduction   3  Hip internal rotation      Hip external rotation 3 3  Knee flexion 4 4+  Knee extension 4 4+  Ankle dorsiflexion      Ankle plantarflexion      Ankle inversion      Ankle eversion       Min extension lag bilat (Blank rows = not tested)   LOWER EXTREMITY SPECIAL TESTS:  NT   FUNCTIONAL TESTS:  5 times sit to stand: NT  10/17/22: 2 minute walk test: 94 feet , pain 7/10 in R knee, had to sit down at this point due to pain and fatigue    GAIT: Distance walked: 30' Assistive device utilized: Environmental consultant - 2 wheeled Level of assistance: CGA Comments: Step to pattern L   TRANSFER:            Min A from mat table, Ind c use of hands from higher chairs and bed at home     TODAY'S TREATMENT:   [facility] Adult PT Treatment:                                                DATE: 10/23/22 Therapeutic Exercise: NuStep LE and UE for 6 min  Step taps  6 inch step x 10  Heel raises x 15  Step taps 4 inch x 10  Sidestepping 2 x 10 feet Seated LAQ x 15  Seated march  x 10  Supine Quad Set 10 reps - 5 hold Active Straight Leg Raise with Quad  Set  x 10  Hooklying Active Hamstring Stretch  -1 x 10 reps Beginner Bridge  2 x 10 reps - 5 hold Ambulation 2 x 75 feet cues for trunk extension  Manual:   PROM Rt LE: flexion, ER, IR hip and knee flexion and extension    [facility] Adult PT Treatment:                                                DATE: 10/17/22 Therapeutic Exercise: Seated LAQ 2 x 15 Hamstring curl Hip flexion/march  Hip abd green band x 15  Sit to stand x 10 elevated mat  Heel raises x 15  2 min walk test with CGA  Supine  SAQ  x 15  SLR 2 x 10 with PT assist for quad lag Bridging 2 x 10                                                                                                                                [facility] Adult PT Treatment:                                                DATE: 09/28/22 Eval   PATIENT EDUCATION:  Education details: Eval findings, POC Person educated: Patient and Child(ren) Education method: Explanation Education comprehension: verbalized understanding   HOME EXERCISE PROGRAM:  Access Code: [email or web address] URL: [email or web address] Date: 10/17/2022 Prepared by: Raeford Razor  Exercises - Supine Quad Set  - 1 x daily - 7 x weekly - 2 sets - 10 reps - 5 hold - Hooklying Active Hamstring Stretch  - 1 x daily - 7 x weekly - 2 sets - 10 reps - Beginner Bridge  - 1 x daily - 7 x weekly - 2 sets - 10 reps - 5 hold - Sit to Stand with Counter Support  - 1 x daily - 7 x weekly - 2 sets - 10 reps - Active Straight Leg Raise with Quad Set  - 1 x daily - 7 x weekly - 2 sets - 10 reps - 2-3 hold  ASSESSMENT:   CLINICAL IMPRESSION: Patient fatigued and with general stiffness, soreness in hips, back.  She needs cues and increased time to understand task and technique of exercise.  She required mod assist to complete ROM of SLR.  LLE weak as well.  Pain evident with full WB on Rt LE with step ups.  Per patient she has not done her HEP but not confirmed with caregiver. Cont POC as tolerated.     OBJECTIVE IMPAIRMENTS: decreased activity tolerance, decreased balance, decreased cognition, decreased coordination, decreased endurance, decreased mobility, difficulty walking, decreased ROM, decreased strength, increased edema,  and postural dysfunction.    ACTIVITY LIMITATIONS: carrying, lifting, bending, sitting, standing, squatting, stairs, transfers, bathing, dressing, hygiene/grooming, and locomotion level   PARTICIPATION LIMITATIONS: community activity and church   PERSONAL FACTORS:  Age, Fitness, Past/current experiences, Time since onset of injury/illness/exacerbation, and 3+ comorbidities: Dementia, DM, HOH, Heart disease, HTN  are also affecting patient's functional outcome.    REHAB POTENTIAL: Good   CLINICAL DECISION MAKING: Evolving/moderate complexity   EVALUATION COMPLEXITY: Moderate     GOALS:   SHORT TERM GOALS: Target date: 10/19/22   Pt will be Ind in an initial HEP  Baseline:To be established, given on eval  Goal status: ongoing    2.  Increase R knee AROM to 100d  Baseline: 93d Goal status: ongoing    LONG TERM GOALS: Target date: 11/30/22   Pt will be Ind in a final HEP to maintain achieved LOF  Baseline: To be established Goal status: INITIAL   2.  Increase R knee flexion to 105d for improved function c sitting and using steps Baseline: 93d Goal status: INITIAL   3.  Increase R hip strength to 4/5 and knee strength to 4+/5 for improved independence and safety c ambulation Baseline: see flow sheets Goal status: INITIAL   4.  Improve 5xSTS by MCID of 5" and 2MWT by MCID of 75ft as indication of improved functional mobility  Baseline: TBA Goal status: INITIAL   5.  Pt's FOTO score will improved to the predicted value of 45% as indication of improved function  Baseline: 25% Goal status: INITIAL   6.  Pt will progress to mod Ind ambulation of 234ft c an appropriate AD for safe home and community ambulation. Baseline: 8ft c RW and CGA Goal status: INITIAL   6.  Pt will progress to CGA to ascend/descend 12 steps c CGA to access home and for community mobility. Baseline: 55ft c RW and CGA Goal status: INITIAL     PLAN:   PT FREQUENCY: 2x/week   PT DURATION: 8 weeks   PLANNED INTERVENTIONS: Therapeutic exercises, Therapeutic activity, Balance training, Gait training, Patient/Family education, Self Care, Joint mobilization, Stair training, Aquatic Therapy, Dry Needling, Electrical stimulation, Cryotherapy, Moist heat, Taping,  Vasopneumatic device, Ultrasound, Ionotophoresis 4mg /ml Dexamethasone, Manual therapy, and Re-evaluation   PLAN FOR NEXT SESSION: check HEP, Nustep.  Sit to stand and there ex as tol.   Esvin Hnat, PT 10/23/2022, 1:28 PM    Raeford Razor, PT 10/23/22 1:28 PM Phone: [phone] Fax: [phone]

## 2022-10-23 ENCOUNTER — Encounter: Payer: Self-pay | Admitting: Physical Therapy

## 2022-10-23 ENCOUNTER — Ambulatory Visit: Payer: PPO | Admitting: Physical Therapy

## 2022-10-23 DIAGNOSIS — M6281 Muscle weakness (generalized): Secondary | ICD-10-CM

## 2022-10-23 DIAGNOSIS — G8929 Other chronic pain: Secondary | ICD-10-CM

## 2022-10-23 DIAGNOSIS — R262 Difficulty in walking, not elsewhere classified: Secondary | ICD-10-CM | POA: Diagnosis not present

## 2022-10-29 NOTE — Therapy (Unsigned)
OUTPATIENT PHYSICAL THERAPY TREATMENT NOTE   Patient Name: [name] MRN: [id number] DOB:01-21-1929, 89 y.o., female Today's Date: 10/30/2022  PCP: Delfina Redwood  REFERRING PROVIDER: Seward Carol MD   END OF SESSION:   PT End of Session - 10/30/22 1240     Visit Number 4    Number of Visits 17    Date for PT Re-Evaluation 11/30/22    Authorization Type [employer] P[employer]    PT Start Time 1230    PT Stop Time 1315    PT Time Calculation (min) 45 min    Activity Tolerance Patient tolerated treatment well;Patient limited by fatigue;Patient limited by pain               Past Medical History:  Diagnosis Date   Dementia    Diabetes mellitus    GERD (gastroesophageal reflux disease)    Glaucoma    Heart disease    Hypertension    Tinnitus    Past Surgical History:  Procedure Laterality Date   BREAST LUMPECTOMY     bil for breast ca   ORIF FEMUR FRACTURE Right 07/03/2022   Procedure: OPEN REDUCTION INTERNAL FIXATION (ORIF) DISTAL FEMUR FRACTURE;  Surgeon: Altamese [place], MD;  Location: [place];  Service: Orthopedics;  Laterality: Right;   Patient Active Problem List   Diagnosis Date Noted   Severe pulmonary hypertension 08/01/2022   Bacteriuria with pyuria 123456   Complicated UTI (urinary tract infection) 07/31/2022   Bilateral hydronephrosis 07/23/2022   Delirium 07/22/2022   History of femur fracture 07/22/2022   Essential hypertension 07/22/2022   Urinary retention 07/22/2022   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   Acute urinary retention 07/17/2022   Complex renal cyst 07/17/2022   Closed fracture of right distal femur 06/30/2022   Chronic diastolic CHF (congestive heart failure) 06/30/2022   history of seizures 06/30/2022   Dementia 03/10/2022   Heart failure 03/10/2022   Acute renal failure superimposed on stage 4 chronic kidney disease 03/10/2022   Type 2 diabetes mellitus with complication, without long-term current use of insulin    Glaucoma     Hyperglycemia 11/24/2020   UTI (urinary tract infection) 11/24/2020   Acute encephalopathy 11/24/2020   GERD (gastroesophageal reflux disease)    Hypertension     REFERRING DIAG: Distal femur fracture  R  THERAPY DIAG:  Difficulty in walking, not elsewhere classified  Muscle weakness (generalized)  Chronic pain of right knee  Rationale for Evaluation and Treatment Rehabilitation  PERTINENT HISTORY: see above   PRECAUTIONS:   SUBJECTIVE:  SUBJECTIVE STATEMENT: The pain is all the time.  Rated 8/10.  I don't know what makes it better.  Pt reports no meds, not using ice or eve pillows to elevate it.   PAIN:  Are you having pain? Yes: NPRS scale: 8/10 Pain location: Rt leg  Pain description: none  Aggravating factors: walking  Relieving factors: rest    OBJECTIVE:  DIAGNOSTIC FINDINGS: See Epic   PATIENT SURVEYS:  FOTO: Perceived function  25%, predicted  45%    COGNITION: Overall cognitive status: Impaired, daughter reports oriented to person, place and situation. STM deficits.   Daughter also reports her vocalizations during walking and exercise are more behavioral than indicative of pain .             SENSATION: WFL   EDEMA:  Min edema R knee   MUSCLE LENGTH: Hamstrings: Right NT deg; Left NT deg Marcello Moores test: Right NT deg; Left NT deg   POSTURE: rounded shoulders, forward head, decreased lumbar lordosis, increased thoracic kyphosis, and flexed trunk    PALPATION: Not TTP   LOWER EXTREMITY ROM:   Active ROM Right eval Left eval  Hip flexion      Hip extension      Hip abduction      Hip adduction      Hip internal rotation      Hip external rotation      Knee flexion 93 118  Knee extension 0 0  Ankle dorsiflexion      Ankle plantarflexion      Ankle inversion       Ankle eversion       (Blank rows = not tested)  LOWER EXTREMITY MMT:   MMT Right eval Left eval  Hip flexion 3- 3  Hip extension 2+ 3  Hip abduction 3- 3  Hip adduction   3  Hip internal rotation      Hip external rotation 3 3  Knee flexion 4 4+  Knee extension 4 4+  Ankle dorsiflexion      Ankle plantarflexion      Ankle inversion      Ankle eversion       Min extension lag bilat (Blank rows = not tested)   LOWER EXTREMITY SPECIAL TESTS:  NT   FUNCTIONAL TESTS:  5 times sit to stand: NT  10/17/22: 2 minute walk test: 94 feet , pain 7/10 in R knee, had to sit down at this point due to pain and fatigue    GAIT: Distance walked: 30' Assistive device utilized: Environmental consultant - 2 wheeled Level of assistance: CGA Comments: Step to pattern L   TRANSFER:            Min A from mat table, Ind c use of hands from higher chairs and bed at home     TODAY'S TREATMENT:  [facility] Adult PT Treatment:                                                DATE: 10/30/22 Therapeutic Exercise: Seated march, LAQ  Supine quad set SLR with PT assist x 10  Green band on thighs supine march x 10  Double leg Clam green band x 15  Mini bridge x 10 with band  Knee to chest added Rt knee extension hands supporting thigh  Sit to stand x 8 Therapeutic Activity: Gait and  transfer training sit to stand , min A overall  Self Care: Spoke with [facility] regarding pain control, symptoms and options for reducing discomfort.     [facility] Adult PT Treatment:                                                DATE: 10/23/22 Therapeutic Exercise: NuStep LE and UE for 6 min  Step taps  6 inch step x 10  Heel raises x 15  Step taps 4 inch x 10  Sidestepping 2 x 10 feet Seated LAQ x 15  Seated march  x 10  Supine Quad Set 10 reps - 5 hold Active Straight Leg Raise with Quad Set  x 10  Hooklying Active Hamstring Stretch  -1 x 10 reps Beginner Bridge  2 x 10 reps - 5 hold Ambulation 2 x 75 feet cues for trunk  extension  Manual:   PROM Rt LE: flexion, ER, IR hip and knee flexion and extension    [facility] Adult PT Treatment:                                                DATE: 10/17/22 Therapeutic Exercise: Seated LAQ 2 x 15 Hamstring curl Hip flexion/march  Hip abd green band x 15  Sit to stand x 10 elevated mat  Heel raises x 15  2 min walk test with CGA  Supine SAQ  x 15  SLR 2 x 10 with PT assist for quad lag Bridging 2 x 10                                                                                                                                [facility] Adult PT Treatment:                                                DATE: 09/28/22 Eval   PATIENT EDUCATION:  Education details: Eval findings, POC Person educated: Patient and Child(ren) Education method: Explanation Education comprehension: verbalized understanding   HOME EXERCISE PROGRAM:  Access Code: [email or web address] URL: [email or web address] Date: 10/17/2022 Prepared by: Raeford Razor  Exercises - Supine Quad Set  - 1 x daily - 7 x weekly - 2 sets - 10 reps - 5 hold - Hooklying Active Hamstring Stretch  - 1 x daily - 7 x weekly - 2 sets - 10 reps - Beginner Bridge  - 1 x daily - 7 x weekly - 2 sets - 10 reps - 5 hold - Sit to Stand with Counter Support  -  1 x daily - 7 x weekly - 2 sets - 10 reps - Active Straight Leg Raise with Quad Set  - 1 x daily - 7 x weekly - 2 sets - 10 reps - 2-3 hold  ASSESSMENT:   CLINICAL IMPRESSION: Patient with 8/10 pain today which is constant per her report.  Per [name] the only time it gives her trouble is when it is going to rain.  She uses her standard walker at home independently.  She needs mod to max A for performing exercises and staying focused.  Will continue to provide rest breaks as needed and encourage safety and stability with gait.   OBJECTIVE IMPAIRMENTS: decreased activity tolerance, decreased balance, decreased cognition, decreased coordination, decreased endurance,  decreased mobility, difficulty walking, decreased ROM, decreased strength, increased edema, and postural dysfunction.    ACTIVITY LIMITATIONS: carrying, lifting, bending, sitting, standing, squatting, stairs, transfers, bathing, dressing, hygiene/grooming, and locomotion level   PARTICIPATION LIMITATIONS: community activity and church   PERSONAL FACTORS: Age, Fitness, Past/current experiences, Time since onset of injury/illness/exacerbation, and 3+ comorbidities: Dementia, DM, HOH, Heart disease, HTN  are also affecting patient's functional outcome.    REHAB POTENTIAL: Good   CLINICAL DECISION MAKING: Evolving/moderate complexity   EVALUATION COMPLEXITY: Moderate     GOALS:   SHORT TERM GOALS: Target date: 10/19/22   Pt will be Ind in an initial HEP  Baseline:To be established, given on eval  Goal status: ongoing    2.  Increase R knee AROM to 100d  Baseline: 93d Goal status: ongoing    LONG TERM GOALS: Target date: 11/30/22   Pt will be Ind in a final HEP to maintain achieved LOF  Baseline: To be established Goal status: INITIAL   2.  Increase R knee flexion to 105d for improved function c sitting and using steps Baseline: 93d Goal status: INITIAL   3.  Increase R hip strength to 4/5 and knee strength to 4+/5 for improved independence and safety c ambulation Baseline: see flow sheets Goal status: INITIAL   4.  Improve 5xSTS by MCID of 5" and 2MWT by MCID of 84ft as indication of improved functional mobility  Baseline: TBA Goal status: INITIAL   5.  Pt's FOTO score will improved to the predicted value of 45% as indication of improved function  Baseline: 25% Goal status: INITIAL   6.  Pt will progress to mod Ind ambulation of 264ft c an appropriate AD for safe home and community ambulation. Baseline: 3ft c RW and CGA Goal status: INITIAL   6.  Pt will progress to CGA to ascend/descend 12 steps c CGA to access home and for community mobility. Baseline: 12ft c RW and  CGA Goal status: INITIAL     PLAN:   PT FREQUENCY: 2x/week   PT DURATION: 8 weeks   PLANNED INTERVENTIONS: Therapeutic exercises, Therapeutic activity, Balance training, Gait training, Patient/Family education, Self Care, Joint mobilization, Stair training, Aquatic Therapy, Dry Needling, Electrical stimulation, Cryotherapy, Moist heat, Taping, Vasopneumatic device, Ultrasound, Ionotophoresis 4mg /ml Dexamethasone, Manual therapy, and Re-evaluation   PLAN FOR NEXT SESSION: Nustep.  General strength and mobility. Rt LE quad and hip strength. Standing as tol.    Juluis Fitzsimmons, PT 10/30/2022, 12:46 PM    Raeford Razor, PT 10/30/22 12:46 PM Phone: [phone] Fax: [phone]

## 2022-10-30 ENCOUNTER — Encounter: Payer: Self-pay | Admitting: Physical Therapy

## 2022-10-30 ENCOUNTER — Ambulatory Visit: Payer: PPO | Attending: Orthopedic Surgery | Admitting: Physical Therapy

## 2022-10-30 DIAGNOSIS — M25561 Pain in right knee: Secondary | ICD-10-CM | POA: Diagnosis not present

## 2022-10-30 DIAGNOSIS — M6281 Muscle weakness (generalized): Secondary | ICD-10-CM | POA: Insufficient documentation

## 2022-10-30 DIAGNOSIS — R262 Difficulty in walking, not elsewhere classified: Secondary | ICD-10-CM | POA: Insufficient documentation

## 2022-10-30 DIAGNOSIS — G8929 Other chronic pain: Secondary | ICD-10-CM | POA: Diagnosis not present

## 2022-10-31 NOTE — Therapy (Addendum)
OUTPATIENT PHYSICAL THERAPY TREATMENT NOTE ADDENDED DISCHARGE   Patient Name: [name] MRN: [id number] DOB:May 12, 1929, 89 y.o., female Today's Date: 11/01/2022  PCP: Nehemiah Settle  REFERRING PROVIDER: Renford Dills MD   END OF SESSION:   PT End of Session - 11/01/22 1247     Visit Number 5    Number of Visits 17    Date for PT Re-Evaluation 11/30/22    Authorization Type [employer]    PT Start Time 1233    PT Stop Time 1318    PT Time Calculation (min) 45 min    Equipment Utilized During Treatment Gait belt;Other (comment)    Activity Tolerance Patient tolerated treatment well;Patient limited by fatigue    Behavior During Therapy WFL for tasks assessed/performed                Past Medical History:  Diagnosis Date   Dementia    Diabetes mellitus    GERD (gastroesophageal reflux disease)    Glaucoma    Heart disease    Hypertension    Tinnitus    Past Surgical History:  Procedure Laterality Date   BREAST LUMPECTOMY     bil for breast ca   ORIF FEMUR FRACTURE Right 07/03/2022   Procedure: OPEN REDUCTION INTERNAL FIXATION (ORIF) DISTAL FEMUR FRACTURE;  Surgeon: Myrene Galas, MD;  Location: [facility] OR;  Service: Orthopedics;  Laterality: Right;   Patient Active Problem List   Diagnosis Date Noted   Severe pulmonary hypertension 08/01/2022   Bacteriuria with pyuria 08/01/2022   Complicated UTI (urinary tract infection) 07/31/2022   Bilateral hydronephrosis 07/23/2022   Delirium 07/22/2022   History of femur fracture 07/22/2022   Essential hypertension 07/22/2022   Urinary retention 07/22/2022   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   Acute urinary retention 07/17/2022   Complex renal cyst 07/17/2022   Closed fracture of right distal femur 06/30/2022   Chronic diastolic CHF (congestive heart failure) 06/30/2022   history of seizures 06/30/2022   Dementia 03/10/2022   Heart failure 03/10/2022   Acute renal failure superimposed on stage 4 chronic kidney  disease 03/10/2022   Type 2 diabetes mellitus with complication, without long-term current use of insulin    Glaucoma    Hyperglycemia 11/24/2020   UTI (urinary tract infection) 11/24/2020   Acute encephalopathy 11/24/2020   GERD (gastroesophageal reflux disease)    Hypertension     REFERRING DIAG: Distal femur fracture  R  THERAPY DIAG:  Difficulty in walking, not elsewhere classified  Muscle weakness (generalized)  Chronic pain of right knee  Rationale for Evaluation and Treatment Rehabilitation  PERTINENT HISTORY: see above   PRECAUTIONS:   SUBJECTIVE:  SUBJECTIVE STATEMENT: Patient here with her daughter. She is not in pain, took Tylenol. Has a ramp at home. She uses a standard walker  at home.  We need to work on coming from sit to standing.   PAIN:  Are you having pain? Yes: NPRS scale: no /10 Pain location: Rt leg  Pain description: none  Aggravating factors: walking  Relieving factors: rest    OBJECTIVE:  DIAGNOSTIC FINDINGS: See Epic   PATIENT SURVEYS:  FOTO: Perceived function  25%, predicted  45%    COGNITION: Overall cognitive status: Impaired, daughter reports oriented to person, place and situation. STM deficits.   Daughter also reports her vocalizations during walking and exercise are more behavioral than indicative of pain .             SENSATION: WFL   EDEMA:  Min edema R knee   MUSCLE LENGTH: Hamstrings: Right NT deg; Left NT deg [name] test: Right NT deg; Left NT deg   POSTURE: rounded shoulders, forward head, decreased lumbar lordosis, increased thoracic kyphosis, and flexed trunk    PALPATION: Not TTP   LOWER EXTREMITY ROM:   Active ROM Right eval Left eval  Hip flexion      Hip extension      Hip abduction      Hip adduction      Hip internal  rotation      Hip external rotation      Knee flexion 93 118  Knee extension 0 0  Ankle dorsiflexion      Ankle plantarflexion      Ankle inversion      Ankle eversion       (Blank rows = not tested)  LOWER EXTREMITY MMT:   MMT Right eval Left eval  Hip flexion 3- 3  Hip extension 2+ 3  Hip abduction 3- 3  Hip adduction   3  Hip internal rotation      Hip external rotation 3 3  Knee flexion 4 4+  Knee extension 4 4+  Ankle dorsiflexion      Ankle plantarflexion      Ankle inversion      Ankle eversion       Min extension lag bilat (Blank rows = not tested)   LOWER EXTREMITY SPECIAL TESTS:  NT   FUNCTIONAL TESTS:  5 times sit to stand: NT  10/17/22: 2 minute walk test: 94 feet , pain 7/10 in R knee, had to sit down at this point due to pain and fatigue    GAIT: Distance walked: 30' Assistive device utilized: Environmental consultant - 2 wheeled Level of assistance: CGA Comments: Step to pattern L   TRANSFER:            Min A from mat table, Ind c use of hands from higher chairs and bed at home     TODAY'S TREATMENT: [facility] Adult PT Treatment:                                                DATE: 11/01/22 Therapeutic Activity: Gait 75 feet with her rollator walker Gait 45 feet with Standard walker with improved safetyx 1 , then x 1 for 75 feet  Sit to stand x 10 elevated mat  Standing there ex: march  heel raise , hip abduction  Sidestepping at countertop 4 x 10  Self Care:  Discussed with daughter her limitations, progress.    [facility] Adult PT Treatment:                                                DATE: 10/30/22 Therapeutic Exercise: Seated march, LAQ  Supine quad set SLR with PT assist x 10  Green band on thighs supine march x 10  Double leg Clam green band x 15  Mini bridge x 10 with band  Knee to chest added Rt knee extension hands supporting thigh  Sit to stand x 8 Therapeutic Activity: Gait and transfer training sit to stand , min A overall  Self Care: Spoke with  [name] regarding pain control, symptoms and options for reducing discomfort.     [facility] Adult PT Treatment:                                                DATE: 10/23/22 Therapeutic Exercise: NuStep LE and UE for 6 min  Step taps  6 inch step x 10  Heel raises x 15  Step taps 4 inch x 10  Sidestepping 2 x 10 feet Seated LAQ x 15  Seated march  x 10  Supine Quad Set 10 reps - 5 hold Active Straight Leg Raise with Quad Set  x 10  Hooklying Active Hamstring Stretch  -1 x 10 reps Beginner Bridge  2 x 10 reps - 5 hold Ambulation 2 x 75 feet cues for trunk extension  Manual:   PROM Rt LE: flexion, ER, IR hip and knee flexion and extension    [facility] Adult PT Treatment:                                                DATE: 10/17/22 Therapeutic Exercise: Seated LAQ 2 x 15 Hamstring curl Hip flexion/march  Hip abd green band x 15  Sit to stand x 10 elevated mat  Heel raises x 15  2 min walk test with CGA  Supine SAQ  x 15  SLR 2 x 10 with PT assist for quad lag Bridging 2 x 10                                                                                                                                [facility] Adult PT Treatment:  DATE: 09/28/22 Eval   PATIENT EDUCATION:  Education details: Eval findings, POC Person educated: Patient and Child(ren) Education method: Explanation Education comprehension: verbalized understanding   HOME EXERCISE PROGRAM:  Access Code: [email or web address] URL: [email or web address] Date: 10/17/2022 Prepared by: Karie Mainland  Exercises - Supine Quad Set  - 1 x daily - 7 x weekly - 2 sets - 10 reps - 5 hold - Hooklying Active Hamstring Stretch  - 1 x daily - 7 x weekly - 2 sets - 10 reps - Beginner Bridge  - 1 x daily - 7 x weekly - 2 sets - 10 reps - 5 hold - Sit to Stand with Counter Support  - 1 x daily - 7 x weekly - 2 sets - 10 reps - Active Straight Leg Raise with Quad Set  - 1 x daily - 7 x weekly -  2 sets - 10 reps - 2-3 hold  ASSESSMENT:   CLINICAL IMPRESSION: Pt's daughter Martie Lee came today to answer questions as needed. She insists that Via is able to be pushed to be more than she lets on.  There is an underlying comorbidity of cardiac issues. O2 sats were in the low 90's (91-94% ) for most of the time. She did better with standard walker than her rollator and she uses the SW at home.  Will continue to gently progress as she tolerates.  Needs frequent rest breaks and constant cues for trunk extension and increased stance on Rt LE.   OBJECTIVE IMPAIRMENTS: decreased activity tolerance, decreased balance, decreased cognition, decreased coordination, decreased endurance, decreased mobility, difficulty walking, decreased ROM, decreased strength, increased edema, and postural dysfunction.    ACTIVITY LIMITATIONS: carrying, lifting, bending, sitting, standing, squatting, stairs, transfers, bathing, dressing, hygiene/grooming, and locomotion level   PARTICIPATION LIMITATIONS: community activity and church   PERSONAL FACTORS: Age, Fitness, Past/current experiences, Time since onset of injury/illness/exacerbation, and 3+ comorbidities: Dementia, DM, HOH, Heart disease, HTN  are also affecting patient's functional outcome.    REHAB POTENTIAL: Good   CLINICAL DECISION MAKING: Evolving/moderate complexity   EVALUATION COMPLEXITY: Moderate     GOALS:   SHORT TERM GOALS: Target date: 10/19/22   Pt will be Ind in an initial HEP  Baseline:To be established, given on eval  Goal status: ongoing    2.  Increase R knee AROM to 100d  Baseline: 93d Goal status: ongoing    LONG TERM GOALS: Target date: 11/30/22   Pt will be Ind in a final HEP to maintain achieved LOF  Baseline: To be established Goal status: INITIAL   2.  Increase R knee flexion to 105d for improved function c sitting and using steps Baseline: 93d Goal status: INITIAL   3.  Increase R hip strength to 4/5 and knee  strength to 4+/5 for improved independence and safety c ambulation Baseline: see flow sheets Goal status: INITIAL   4.  Improve 5xSTS by MCID of 5" and by MCID of 60ft as indication of improved functional mobility  Baseline: TBA Goal status: INITIAL   5.  Pt's FOTO score will improved to the predicted value of 45% as indication of improved function  Baseline: 25% Goal status: INITIAL   6.  Pt will progress to mod Ind ambulation of 255ft c an appropriate AD for safe home and community ambulation. Baseline: 52ft c RW and CGA Goal status: INITIAL   6.  Pt will progress to CGA to ascend/descend 12 steps c CGA to access home and for community mobility. Baseline: 26ft  c RW and CGA Goal status: INITIAL     PLAN:   PT FREQUENCY: 2x/week   PT DURATION: 8 weeks   PLANNED INTERVENTIONS: Therapeutic exercises, Therapeutic activity, Balance training, Gait training, Patient/Family education, Self Care, Joint mobilization, Stair training, Aquatic Therapy, Dry Needling, Electrical stimulation, Cryotherapy, Moist heat, Taping, Vasopneumatic device, Ultrasound, Ionotophoresis 4mg /ml Dexamethasone, Manual therapy, and Re-evaluation   PLAN FOR NEXT SESSION: Nustep.  General strength and mobility. Rt LE quad and hip strength. Standing as tol.    Tyhesha Dutson, PT 11/01/2022, 1:35 PM    Karie Mainland, PT 11/01/22 1:35 PM Phone: [phone] Fax: [phone]   PHYSICAL THERAPY DISCHARGE SUMMARY  Visits from Start of Care: 5  Current functional level related to goals / functional outcomes: See above for most recent    Remaining deficits: Endurance and strength    Education / Equipment: HEP, mobility    Patient agrees to discharge. Patient goals were partially met. Patient is being discharged due to the patient's request.  Karie Mainland, PT 11/13/22 12:09 PM Phone: [phone] Fax: [phone]

## 2022-11-01 ENCOUNTER — Ambulatory Visit: Payer: PPO | Admitting: Physical Therapy

## 2022-11-01 DIAGNOSIS — G8929 Other chronic pain: Secondary | ICD-10-CM

## 2022-11-01 DIAGNOSIS — R262 Difficulty in walking, not elsewhere classified: Secondary | ICD-10-CM | POA: Diagnosis not present

## 2022-11-01 DIAGNOSIS — M6281 Muscle weakness (generalized): Secondary | ICD-10-CM

## 2022-11-05 NOTE — Therapy (Deleted)
OUTPATIENT PHYSICAL THERAPY TREATMENT NOTE   Patient Name: [name] MRN: [id number] DOB:05-02-1929, 89 y.o., female Today's Date: 11/05/2022  PCP: [name]  REFERRING PROVIDER: Renford Dills MD   END OF SESSION:        Past Medical History:  Diagnosis Date   Dementia    Diabetes mellitus    GERD (gastroesophageal reflux disease)    Glaucoma    Heart disease    Hypertension    Tinnitus    Past Surgical History:  Procedure Laterality Date   BREAST LUMPECTOMY     bil for breast ca   ORIF FEMUR FRACTURE Right 07/03/2022   Procedure: OPEN REDUCTION INTERNAL FIXATION (ORIF) DISTAL FEMUR FRACTURE;  Surgeon: Laura Galas, MD;  Location: [name];  Service: [name];  Laterality: Right;   Patient Active Problem List   Diagnosis Date Noted   Severe pulmonary hypertension 08/01/2022   Bacteriuria with pyuria 08/01/2022   Complicated UTI (urinary tract infection) 07/31/2022   Bilateral hydronephrosis 07/23/2022   Delirium 07/22/2022   History of femur fracture 07/22/2022   Essential hypertension 07/22/2022   Urinary retention 07/22/2022   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   Acute urinary retention 07/17/2022   Complex renal cyst 07/17/2022   Closed fracture of right distal femur 06/30/2022   Chronic diastolic CHF (congestive heart failure) 06/30/2022   history of seizures 06/30/2022   Dementia 03/10/2022   Heart failure 03/10/2022   Acute renal failure superimposed on stage 4 chronic kidney disease 03/10/2022   Type 2 diabetes mellitus with complication, without long-term current use of insulin    Glaucoma    Hyperglycemia 11/24/2020   UTI (urinary tract infection) 11/24/2020   Acute encephalopathy 11/24/2020   GERD (gastroesophageal reflux disease)    Hypertension     REFERRING DIAG: Distal femur fracture  R  THERAPY DIAG:  No diagnosis found.  Rationale for Evaluation and Treatment Rehabilitation  PERTINENT HISTORY: see above   PRECAUTIONS:    SUBJECTIVE:                                                                                                                                                                                      SUBJECTIVE STATEMENT: Patient here with her daughter. She is not in pain, took Tylenol. Has a ramp at home. She uses a standard walker  at home.  We need to work on coming from sit to standing.   PAIN:  Are you having pain? Yes: NPRS scale: no /10 Pain location: Rt leg  Pain description: none  Aggravating factors: walking  Relieving factors: rest    OBJECTIVE:  DIAGNOSTIC FINDINGS: See Epic   PATIENT SURVEYS:  FOTO:  Perceived function  25%, predicted  45%    COGNITION: Overall cognitive status: Impaired, daughter reports oriented to person, place and situation. STM deficits.   Daughter also reports her vocalizations during walking and exercise are more behavioral than indicative of pain .             SENSATION: WFL   EDEMA:  Min edema R knee   MUSCLE LENGTH: Hamstrings: Right NT deg; Left NT deg [name] test: Right NT deg; Left NT deg   POSTURE: rounded shoulders, forward head, decreased lumbar lordosis, increased thoracic kyphosis, and flexed trunk    PALPATION: Not TTP   LOWER EXTREMITY ROM:   Active ROM Right eval Left eval  Hip flexion      Hip extension      Hip abduction      Hip adduction      Hip internal rotation      Hip external rotation      Knee flexion 93 118  Knee extension 0 0  Ankle dorsiflexion      Ankle plantarflexion      Ankle inversion      Ankle eversion       (Blank rows = not tested)  LOWER EXTREMITY MMT:   MMT Right eval Left eval  Hip flexion 3- 3  Hip extension 2+ 3  Hip abduction 3- 3  Hip adduction   3  Hip internal rotation      Hip external rotation 3 3  Knee flexion 4 4+  Knee extension 4 4+  Ankle dorsiflexion      Ankle plantarflexion      Ankle inversion      Ankle eversion       Min extension lag bilat (Blank rows  = not tested)   LOWER EXTREMITY SPECIAL TESTS:  NT   FUNCTIONAL TESTS:  5 times sit to stand: NT  10/17/22: 2 minute walk test: 94 feet , pain 7/10 in R knee, had to sit down at this point due to pain and fatigue    GAIT: Distance walked: 30' Assistive device utilized: Environmental consultant - 2 wheeled Level of assistance: CGA Comments: Step to pattern L   TRANSFER:            Min A from mat table, Ind c use of hands from higher chairs and bed at home     TODAY'S TREATMENT:  [name] Adult PT Treatment:                                                DATE: 11/06/22 Therapeutic Exercise: *** Manual Therapy: *** Neuromuscular re-ed: *** Therapeutic Activity: *** Modalities: *** Self Care: [name] Adult PT Treatment:                                                DATE: 11/01/22 Therapeutic Activity: Gait 75 feet with her rollator walker Gait 45 feet with Standard walker with improved safetyx 1 , then x 1 for 75 feet  Sit to stand x 10 elevated mat  Standing there ex: march  heel raise , hip abduction  Sidestepping at countertop 4 x 10  Self Care: Discussed with daughter her limitations, progress.    [name]  Adult PT Treatment:                                                DATE: 10/30/22 Therapeutic Exercise: Seated march, LAQ  Supine quad set SLR with PT assist x 10  Green band on thighs supine march x 10  Double leg Clam green band x 15  Mini bridge x 10 with band  Knee to chest added Rt knee extension hands supporting thigh  Sit to stand x 8 Therapeutic Activity: Gait and transfer training sit to stand , min A overall  Self Care: Spoke with [name] regarding pain control, symptoms and options for reducing discomfort.     [facility]                                                DATE: 10/23/22 Therapeutic Exercise: NuStep LE and UE for 6 min  Step taps  6 inch step x 10  Heel raises x 15  Step taps 4 inch x 10  Sidestepping 2 x 10 feet Seated LAQ x 15  Seated  march  x 10  Supine Quad Set 10 reps - 5 hold Active Straight Leg Raise with Quad Set  x 10  Hooklying Active Hamstring Stretch  -1 x 10 reps Beginner Bridge  2 x 10 reps - 5 hold Ambulation 2 x 75 feet cues for trunk extension  Manual:   PROM Rt LE: flexion, ER, IR hip and knee flexion and extension    [name] Adult PT Treatment:                                                DATE: 10/17/22 Therapeutic Exercise: Seated LAQ 2 x 15 Hamstring curl Hip flexion/march  Hip abd green band x 15  Sit to stand x 10 elevated mat  Heel raises x 15  2 min walk test with CGA  Supine SAQ  x 15  SLR 2 x 10 with PT assist for quad lag Bridging 2 x 10                                                                                                                                [name] Adult PT Treatment:                                                DATE: 09/28/22 Eval  PATIENT EDUCATION:  Education details: Eval findings, POC Person educated: Patient and Child(ren) Education method: Explanation Education comprehension: verbalized understanding   HOME EXERCISE PROGRAM:  Access Code: [email or web address] URL: [email or web address] Date: 10/17/2022 Prepared by: Karie Mainland  Exercises - Supine Quad Set  - 1 x daily - 7 x weekly - 2 sets - 10 reps - 5 hold - Hooklying Active Hamstring Stretch  - 1 x daily - 7 x weekly - 2 sets - 10 reps - Beginner Bridge  - 1 x daily - 7 x weekly - 2 sets - 10 reps - 5 hold - Sit to Stand with Counter Support  - 1 x daily - 7 x weekly - 2 sets - 10 reps - Active Straight Leg Raise with Quad Set  - 1 x daily - 7 x weekly - 2 sets - 10 reps - 2-3 hold  ASSESSMENT:   CLINICAL IMPRESSION: Pt's daughter Martie Lee came today to answer questions as needed. She insists that Laylee is able to be pushed to be more than she lets on.  There is an underlying comorbidity of cardiac issues. O2 sats were in the low 90's (91-94% ) for most of the time. She did better with  standard walker than her rollator and she uses the SW at home.  Will continue to gently progress as she tolerates.  Needs frequent rest breaks and constant cues for trunk extension and increased stance on Rt LE.   OBJECTIVE IMPAIRMENTS: decreased activity tolerance, decreased balance, decreased cognition, decreased coordination, decreased endurance, decreased mobility, difficulty walking, decreased ROM, decreased strength, increased edema, and postural dysfunction.    ACTIVITY LIMITATIONS: carrying, lifting, bending, sitting, standing, squatting, stairs, transfers, bathing, dressing, hygiene/grooming, and locomotion level   PARTICIPATION LIMITATIONS: community activity and church   PERSONAL FACTORS: Age, Fitness, Past/current experiences, Time since onset of injury/illness/exacerbation, and 3+ comorbidities: Dementia, DM, HOH, Heart disease, HTN  are also affecting patient's functional outcome.    REHAB POTENTIAL: Good   CLINICAL DECISION MAKING: Evolving/moderate complexity   EVALUATION COMPLEXITY: Moderate     GOALS:   SHORT TERM GOALS: Target date: 10/19/22   Pt will be Ind in an initial HEP  Baseline:To be established, given on eval  Goal status: ongoing    2.  Increase R knee AROM to 100d  Baseline: 93d Goal status: ongoing    LONG TERM GOALS: Target date: 11/30/22   Pt will be Ind in a final HEP to maintain achieved LOF  Baseline: To be established Goal status: INITIAL   2.  Increase R knee flexion to 105d for improved function c sitting and using steps Baseline: 93d Goal status: INITIAL   3.  Increase R hip strength to 4/5 and knee strength to 4+/5 for improved independence and safety c ambulation Baseline: see flow sheets Goal status: INITIAL   4.  Improve 5xSTS by MCID of 5" and by MCID of 72ft as indication of improved functional mobility  Baseline: TBA Goal status: INITIAL   5.  Pt's FOTO score will improved to the predicted value of 45% as indication of  improved function  Baseline: 25% Goal status: INITIAL   6.  Pt will progress to mod Ind ambulation of 245ft c an appropriate AD for safe home and community ambulation. Baseline: 20ft c RW and CGA Goal status: INITIAL   6.  Pt will progress to CGA to ascend/descend 12 steps c CGA to access home and for community mobility. Baseline: 70ft c RW and CGA Goal  status: INITIAL     PLAN:   PT FREQUENCY: 2x/week   PT DURATION: 8 weeks   PLANNED INTERVENTIONS: Therapeutic exercises, Therapeutic activity, Balance training, Gait training, Patient/Family education, Self Care, Joint mobilization, Stair training, Aquatic Therapy, Dry Needling, Electrical stimulation, Cryotherapy, Moist heat, Taping, Vasopneumatic device, Ultrasound, Ionotophoresis 4mg /ml Dexamethasone, Manual therapy, and Re-evaluation   PLAN FOR NEXT SESSION: Nustep.  General strength and mobility. Rt LE quad and hip strength. Standing as tol.    Deyona Soza, PT 11/05/2022, 3:35 PM    Karie Mainland, PT 11/05/22 3:35 PM Phone: [phone] Fax: [phone]

## 2022-11-06 ENCOUNTER — Ambulatory Visit: Payer: PPO | Admitting: Physical Therapy

## 2022-11-08 ENCOUNTER — Ambulatory Visit: Payer: PPO

## 2022-11-13 ENCOUNTER — Ambulatory Visit: Payer: PPO | Admitting: Physical Therapy

## 2022-11-15 ENCOUNTER — Other Ambulatory Visit: Payer: Self-pay

## 2022-11-15 ENCOUNTER — Ambulatory Visit: Payer: PPO | Admitting: Physical Therapy

## 2022-11-15 ENCOUNTER — Inpatient Hospital Stay (HOSPITAL_BASED_OUTPATIENT_CLINIC_OR_DEPARTMENT_OTHER)
Admission: EM | Admit: 2022-11-15 | Discharge: 2022-11-25 | DRG: 291 | Disposition: A | Discharge status: Home Health | Payer: PPO | Attending: Internal Medicine | Admitting: Internal Medicine

## 2022-11-15 ENCOUNTER — Emergency Department (HOSPITAL_BASED_OUTPATIENT_CLINIC_OR_DEPARTMENT_OTHER): Payer: PPO

## 2022-11-15 ENCOUNTER — Encounter (HOSPITAL_BASED_OUTPATIENT_CLINIC_OR_DEPARTMENT_OTHER): Payer: Self-pay

## 2022-11-15 ENCOUNTER — Observation Stay (HOSPITAL_COMMUNITY): Payer: PPO

## 2022-11-15 DIAGNOSIS — I130 Hypertensive heart and chronic kidney disease with heart failure and stage 1 through stage 4 chronic kidney disease, or unspecified chronic kidney disease: Secondary | ICD-10-CM | POA: Diagnosis not present

## 2022-11-15 DIAGNOSIS — Z87440 Personal history of urinary (tract) infections: Secondary | ICD-10-CM

## 2022-11-15 DIAGNOSIS — Z9013 Acquired absence of bilateral breasts and nipples: Secondary | ICD-10-CM

## 2022-11-15 DIAGNOSIS — I10 Essential (primary) hypertension: Secondary | ICD-10-CM | POA: Diagnosis present

## 2022-11-15 DIAGNOSIS — Z794 Long term (current) use of insulin: Secondary | ICD-10-CM

## 2022-11-15 DIAGNOSIS — Z888 Allergy status to other drugs, medicaments and biological substances status: Secondary | ICD-10-CM | POA: Diagnosis not present

## 2022-11-15 DIAGNOSIS — Z79899 Other long term (current) drug therapy: Secondary | ICD-10-CM

## 2022-11-15 DIAGNOSIS — I5032 Chronic diastolic (congestive) heart failure: Secondary | ICD-10-CM | POA: Diagnosis not present

## 2022-11-15 DIAGNOSIS — Z66 Do not resuscitate: Secondary | ICD-10-CM | POA: Diagnosis not present

## 2022-11-15 DIAGNOSIS — Z91040 Latex allergy status: Secondary | ICD-10-CM

## 2022-11-15 DIAGNOSIS — Z853 Personal history of malignant neoplasm of breast: Secondary | ICD-10-CM

## 2022-11-15 DIAGNOSIS — R0602 Shortness of breath: Secondary | ICD-10-CM | POA: Diagnosis not present

## 2022-11-15 DIAGNOSIS — F0390 Unspecified dementia without behavioral disturbance: Secondary | ICD-10-CM | POA: Diagnosis not present

## 2022-11-15 DIAGNOSIS — I272 Pulmonary hypertension, unspecified: Secondary | ICD-10-CM | POA: Diagnosis not present

## 2022-11-15 DIAGNOSIS — Z885 Allergy status to narcotic agent status: Secondary | ICD-10-CM

## 2022-11-15 DIAGNOSIS — I071 Rheumatic tricuspid insufficiency: Secondary | ICD-10-CM | POA: Diagnosis not present

## 2022-11-15 DIAGNOSIS — F03C0 Unspecified dementia, severe, without behavioral disturbance, psychotic disturbance, mood disturbance, and anxiety: Secondary | ICD-10-CM | POA: Diagnosis not present

## 2022-11-15 DIAGNOSIS — Z515 Encounter for palliative care: Secondary | ICD-10-CM | POA: Diagnosis not present

## 2022-11-15 DIAGNOSIS — G40909 Epilepsy, unspecified, not intractable, without status epilepticus: Secondary | ICD-10-CM | POA: Diagnosis not present

## 2022-11-15 DIAGNOSIS — N179 Acute kidney failure, unspecified: Secondary | ICD-10-CM | POA: Diagnosis present

## 2022-11-15 DIAGNOSIS — N184 Chronic kidney disease, stage 4 (severe): Secondary | ICD-10-CM | POA: Diagnosis present

## 2022-11-15 DIAGNOSIS — M109 Gout, unspecified: Secondary | ICD-10-CM | POA: Diagnosis present

## 2022-11-15 DIAGNOSIS — Z8781 Personal history of (healed) traumatic fracture: Secondary | ICD-10-CM

## 2022-11-15 DIAGNOSIS — E1122 Type 2 diabetes mellitus with diabetic chronic kidney disease: Secondary | ICD-10-CM | POA: Diagnosis present

## 2022-11-15 DIAGNOSIS — E118 Type 2 diabetes mellitus with unspecified complications: Secondary | ICD-10-CM | POA: Diagnosis present

## 2022-11-15 DIAGNOSIS — R0789 Other chest pain: Secondary | ICD-10-CM | POA: Diagnosis present

## 2022-11-15 DIAGNOSIS — R0902 Hypoxemia: Secondary | ICD-10-CM | POA: Diagnosis not present

## 2022-11-15 DIAGNOSIS — I5033 Acute on chronic diastolic (congestive) heart failure: Principal | ICD-10-CM | POA: Diagnosis present

## 2022-11-15 DIAGNOSIS — Z91048 Other nonmedicinal substance allergy status: Secondary | ICD-10-CM | POA: Diagnosis not present

## 2022-11-15 DIAGNOSIS — R519 Headache, unspecified: Secondary | ICD-10-CM | POA: Diagnosis present

## 2022-11-15 DIAGNOSIS — H409 Unspecified glaucoma: Secondary | ICD-10-CM | POA: Diagnosis present

## 2022-11-15 DIAGNOSIS — D631 Anemia in chronic kidney disease: Secondary | ICD-10-CM | POA: Diagnosis not present

## 2022-11-15 DIAGNOSIS — G8929 Other chronic pain: Secondary | ICD-10-CM | POA: Diagnosis present

## 2022-11-15 DIAGNOSIS — N281 Cyst of kidney, acquired: Secondary | ICD-10-CM | POA: Diagnosis not present

## 2022-11-15 DIAGNOSIS — K219 Gastro-esophageal reflux disease without esophagitis: Secondary | ICD-10-CM | POA: Diagnosis not present

## 2022-11-15 DIAGNOSIS — I509 Heart failure, unspecified: Secondary | ICD-10-CM | POA: Diagnosis not present

## 2022-11-15 DIAGNOSIS — Z87891 Personal history of nicotine dependence: Secondary | ICD-10-CM | POA: Diagnosis not present

## 2022-11-15 DIAGNOSIS — N390 Urinary tract infection, site not specified: Secondary | ICD-10-CM | POA: Diagnosis present

## 2022-11-15 DIAGNOSIS — Z7189 Other specified counseling: Secondary | ICD-10-CM | POA: Diagnosis not present

## 2022-11-15 DIAGNOSIS — I160 Hypertensive urgency: Secondary | ICD-10-CM | POA: Diagnosis present

## 2022-11-15 DIAGNOSIS — R109 Unspecified abdominal pain: Secondary | ICD-10-CM | POA: Diagnosis not present

## 2022-11-15 DIAGNOSIS — I110 Hypertensive heart disease with heart failure: Secondary | ICD-10-CM | POA: Diagnosis not present

## 2022-11-15 LAB — BASIC METABOLIC PANEL
Anion gap: 9 (ref 5–15)
Anion gap: 10 (ref 5–15)
BUN: 41 mg/dL — ABNORMAL HIGH (ref 8–23)
BUN: 37 mg/dL — ABNORMAL HIGH (ref 8–23)
CO2: 23 mmol/L (ref 22–32)
CO2: 25 mmol/L (ref 22–32)
Calcium: 9.7 mg/dL (ref 8.9–10.3)
Calcium: 9.4 mg/dL (ref 8.9–10.3)
Chloride: 105 mmol/L (ref 98–111)
Chloride: 102 mmol/L (ref 98–111)
Creatinine, Ser: 2.31 mg/dL — ABNORMAL HIGH (ref 0.44–1.00)
Creatinine, Ser: 2.24 mg/dL — ABNORMAL HIGH (ref 0.44–1.00)
GFR, Estimated: 19 mL/min — ABNORMAL LOW (ref >60)
GFR, Estimated: 20 mL/min — ABNORMAL LOW (ref >60)
Glucose, Bld: 125 mg/dL — ABNORMAL HIGH (ref 70–99)
Glucose, Bld: 138 mg/dL — ABNORMAL HIGH (ref 70–99)
Potassium: 4.3 mmol/L (ref 3.5–5.1)
Potassium: 3.9 mmol/L (ref 3.5–5.1)
Sodium: 137 mmol/L (ref 135–145)
Sodium: 137 mmol/L (ref 135–145)

## 2022-11-15 LAB — CBC
HCT: 33.2 % — ABNORMAL LOW (ref 36.0–46.0)
Hemoglobin: 10.7 g/dL — ABNORMAL LOW (ref 12.0–15.0)
MCH: 31.3 pg (ref 26.0–34.0)
MCHC: 32.2 g/dL (ref 30.0–36.0)
MCV: 97.1 fL (ref 80.0–100.0)
Platelets: 249 10*3/uL (ref 150–400)
RBC: 3.42 MIL/uL — ABNORMAL LOW (ref 3.87–5.11)
RDW: 16 % — ABNORMAL HIGH (ref 11.5–15.5)
WBC: 3.7 10*3/uL — ABNORMAL LOW (ref 4.0–10.5)
nRBC: 0 % (ref 0.0–0.2)

## 2022-11-15 LAB — I-STAT VENOUS BLOOD GAS, ED
Acid-base deficit: 1 mmol/L (ref 0.0–2.0)
Bicarbonate: 25.5 mmol/L (ref 20.0–28.0)
Calcium, Ion: 1.34 mmol/L (ref 1.15–1.40)
HCT: 36 % (ref 36.0–46.0)
Hemoglobin: 12.2 g/dL (ref 12.0–15.0)
O2 Saturation: 16 %
Patient temperature: 36.6
Potassium: 4.3 mmol/L (ref 3.5–5.1)
Sodium: 138 mmol/L (ref 135–145)
TCO2: 27 mmol/L (ref 22–32)
pCO2, Ven: 50.9 mmHg (ref 44–60)
pH, Ven: 7.307 (ref 7.25–7.43)
pO2, Ven: <15 mmHg — CL (ref 32–45)

## 2022-11-15 LAB — HEPATIC FUNCTION PANEL
ALT: 37 U/L (ref 0–44)
AST: 36 U/L (ref 15–41)
Albumin: 3.1 g/dL — ABNORMAL LOW (ref 3.5–5.0)
Alkaline Phosphatase: 178 U/L — ABNORMAL HIGH (ref 38–126)
Bilirubin, Direct: 0.1 mg/dL (ref 0.0–0.2)
Indirect Bilirubin: 0.3 mg/dL (ref 0.3–0.9)
Total Bilirubin: 0.4 mg/dL (ref 0.3–1.2)
Total Protein: 6.4 g/dL — ABNORMAL LOW (ref 6.5–8.1)

## 2022-11-15 LAB — HEMOGLOBIN A1C
Hgb A1c MFr Bld: 6.1 % — ABNORMAL HIGH (ref 4.8–5.6)
Mean Plasma Glucose: 128.37 mg/dL

## 2022-11-15 LAB — TROPONIN I (HIGH SENSITIVITY): Troponin I (High Sensitivity): 20 ng/L — ABNORMAL HIGH (ref <18)

## 2022-11-15 LAB — GLUCOSE, CAPILLARY
Glucose-Capillary: 105 mg/dL — ABNORMAL HIGH (ref 70–99)
Glucose-Capillary: 133 mg/dL — ABNORMAL HIGH (ref 70–99)

## 2022-11-15 LAB — MAGNESIUM: Magnesium: 2.1 mg/dL (ref 1.7–2.4)

## 2022-11-15 LAB — LIPASE, BLOOD: Lipase: 24 U/L (ref 11–51)

## 2022-11-15 LAB — BRAIN NATRIURETIC PEPTIDE: B Natriuretic Peptide: 1969.7 pg/mL — ABNORMAL HIGH (ref 0.0–100.0)

## 2022-11-15 MED ORDER — POTASSIUM CHLORIDE CRYS ER 20 MEQ PO TBCR: 20.0000 meq | EXTENDED_RELEASE_TABLET | Freq: Once | ORAL | Status: DC

## 2022-11-15 MED ORDER — ONDANSETRON HCL 4 MG/2ML IJ SOLN
4.0000 mg | Freq: Four times a day (QID) | INTRAMUSCULAR | Status: DC | PRN
  Administered 2022-11-23: 4 mg via INTRAVENOUS
  Filled 2022-11-15: qty 2

## 2022-11-15 MED ORDER — NITROFURANTOIN MONOHYD MACRO 100 MG PO CAPS: 100.0000 mg | ORAL_CAPSULE | Freq: Every day | ORAL | Status: DC

## 2022-11-15 MED ORDER — QUETIAPINE FUMARATE 25 MG PO TABS
25.0000 mg | ORAL_TABLET | Freq: Every day | ORAL | Status: DC
  Administered 2022-11-15 – 2022-11-16 (×2): 25 mg via ORAL
  Filled 2022-11-15 (×3): qty 1

## 2022-11-15 MED ORDER — FUROSEMIDE 10 MG/ML IJ SOLN
40.0000 mg | Freq: Once | INTRAMUSCULAR | Status: AC
  Administered 2022-11-15: 40 mg via INTRAVENOUS
  Filled 2022-11-15: qty 4

## 2022-11-15 MED ORDER — MIRTAZAPINE 15 MG PO TABS
15.0000 mg | ORAL_TABLET | Freq: Every day | ORAL | Status: DC
  Administered 2022-11-15 – 2022-11-24 (×9): 15 mg via ORAL
  Filled 2022-11-15 (×10): qty 1

## 2022-11-15 MED ORDER — GABAPENTIN 100 MG PO CAPS
100.0000 mg | ORAL_CAPSULE | Freq: Every day | ORAL | Status: DC
  Administered 2022-11-16 – 2022-11-24 (×9): 100 mg via ORAL
  Filled 2022-11-15 (×10): qty 1

## 2022-11-15 MED ORDER — ACETAMINOPHEN 500 MG PO TABS: 1000.0000 mg | ORAL_TABLET | Freq: Two times a day (BID) | ORAL | Status: DC | PRN

## 2022-11-15 MED ORDER — GABAPENTIN 100 MG PO CAPS: 100.0000 mg | ORAL_CAPSULE | Freq: Two times a day (BID) | ORAL | Status: DC

## 2022-11-15 MED ORDER — AMLODIPINE BESYLATE 10 MG PO TABS
10.0000 mg | ORAL_TABLET | Freq: Every day | ORAL | Status: DC
  Administered 2022-11-15 – 2022-11-20 (×6): 10 mg via ORAL
  Filled 2022-11-15 (×6): qty 1

## 2022-11-15 MED ORDER — ACETAMINOPHEN 325 MG PO TABS
650.0000 mg | ORAL_TABLET | Freq: Four times a day (QID) | ORAL | Status: DC | PRN
  Administered 2022-11-15 – 2022-11-24 (×8): 650 mg via ORAL
  Filled 2022-11-15 (×8): qty 2

## 2022-11-15 MED ORDER — HEPARIN SODIUM (PORCINE) 5000 UNIT/ML IJ SOLN
5000.0000 [IU] | Freq: Three times a day (TID) | INTRAMUSCULAR | Status: DC
  Administered 2022-11-15 – 2022-11-25 (×30): 5000 [IU] via SUBCUTANEOUS
  Filled 2022-11-15 (×31): qty 1

## 2022-11-15 MED ORDER — ACETAMINOPHEN 650 MG RE SUPP: 650.0000 mg | Freq: Four times a day (QID) | RECTAL | Status: DC | PRN

## 2022-11-15 MED ORDER — HYDRALAZINE HCL 20 MG/ML IJ SOLN: 10.0000 mg | INTRAMUSCULAR | Status: DC | PRN

## 2022-11-15 MED ORDER — SODIUM CHLORIDE 0.9% FLUSH
3.0000 mL | Freq: Two times a day (BID) | INTRAVENOUS | Status: DC
  Administered 2022-11-15 – 2022-11-25 (×17): 3 mL via INTRAVENOUS

## 2022-11-15 MED ORDER — HYDRALAZINE HCL 50 MG PO TABS
50.0000 mg | ORAL_TABLET | Freq: Three times a day (TID) | ORAL | Status: DC
  Filled 2022-11-15: qty 1

## 2022-11-15 MED ORDER — HYDRALAZINE HCL 50 MG PO TABS
75.0000 mg | ORAL_TABLET | Freq: Three times a day (TID) | ORAL | Status: DC
  Administered 2022-11-15 – 2022-11-17 (×6): 75 mg via ORAL
  Filled 2022-11-15 (×6): qty 1

## 2022-11-15 MED ORDER — PANTOPRAZOLE SODIUM 40 MG PO TBEC
40.0000 mg | DELAYED_RELEASE_TABLET | Freq: Every day | ORAL | Status: DC
  Administered 2022-11-16 – 2022-11-25 (×10): 40 mg via ORAL
  Filled 2022-11-15 (×12): qty 1

## 2022-11-15 MED ORDER — ONDANSETRON HCL 4 MG PO TABS: 4.0000 mg | ORAL_TABLET | Freq: Four times a day (QID) | ORAL | Status: DC | PRN

## 2022-11-15 MED ORDER — LEVETIRACETAM 500 MG PO TABS
500.0000 mg | ORAL_TABLET | Freq: Two times a day (BID) | ORAL | Status: DC
  Administered 2022-11-15 – 2022-11-25 (×20): 500 mg via ORAL
  Filled 2022-11-15 (×20): qty 1

## 2022-11-15 MED ORDER — ISOSORBIDE MONONITRATE ER 30 MG PO TB24
30.0000 mg | ORAL_TABLET | Freq: Every day | ORAL | Status: DC
  Administered 2022-11-15 – 2022-11-20 (×6): 30 mg via ORAL
  Filled 2022-11-15 (×6): qty 1

## 2022-11-15 MED ORDER — METOPROLOL TARTRATE 100 MG PO TABS
100.0000 mg | ORAL_TABLET | Freq: Two times a day (BID) | ORAL | Status: DC
  Administered 2022-11-15 – 2022-11-20 (×11): 100 mg via ORAL
  Filled 2022-11-15 (×11): qty 1

## 2022-11-15 NOTE — Hospital Course (Addendum)
[name] is a 89 y.o. female with PMH significant of chronic diastolic CHF, CKD 4, emphysematous cystitis, uncontrolled hypertension, chronic pain, seizure disorder, gout. Patient was brought in by daughter secondary to complaints of poor p.o. intake as well as swelling in the leg. CXR with atelectasis vs infiltrates.  BNP >1900.  ECG old RBBB.  SpO2 88%. treated with IV diuresis. Developed AKI with serum creatinine trending from 1-2.3 at the time of admission to 2.75 right now.  Receiving IV albumin.

## 2022-11-15 NOTE — ED Provider Notes (Signed)
[place] EMERGENCY DEPARTMENT AT [facility] Provider Note   CSN: [id number] Arrival date & time: 11/15/22  1101     History  Chief Complaint  Patient presents with   Foot Swelling    [name] is a 89 y.o. female.  The history is provided by the patient, a relative and medical records. No language interpreter was used.     89 year old female significant history of diabetes, hypertension, dementia, brought in by family member with complaints of bodyaches.  History obtained through daughter who is at bedside.  For the past week daughter noticed that patient is having decrease in appetite, not eating and drinking much.  She is also less active than usual, sleeping in bed mostly.  Daughter also noticed that her leg swelling has increased.  States that she has history of CHF and does take Lasix on a regular basis.  Daughter also mentioned that patient is prone for UTI but she has not complained of any urinary symptoms.  No report of any runny nose sneezing or coughing no complaints of chest pain or shortness of breath no complaints of abdominal pain.  No recent change in medication.  Daughter worries that patient may have some electrolyte abnormalities given the fact that she is not eating and drinking quite as much.  Patient suffered a right femoral fracture several months prior and is currently going through physical therapy for that.  She uses a walker to walk at home and use a wheelchair to travel about.  Home Medications Prior to Admission medications   Medication Sig Start Date End Date Taking? Authorizing Provider  acetaminophen (TYLENOL) 500 MG tablet Take 2 tablets (1,000 mg total) by mouth every 8 (eight) hours as needed for mild pain or moderate pain. 07/25/22   Ghimire, Werner Lean, MD  allopurinol (ZYLOPRIM) 100 MG tablet Take 100 mg by mouth 2 (two) times daily.    [provider]  amLODipine (NORVASC) 10 MG tablet Take 10 mg by mouth daily.    [provider]  blood glucose meter kit and supplies KIT Dispense based on patient and insurance preference. Use up to four times daily as directed. (FOR ICD-9 250.00, 250.01). For QAC - HS accuchecks. 11/27/20   Leroy Sea, MD  ciprofloxacin (CIPRO) 500 MG tablet Take 1 tablet (500 mg total) by mouth daily with breakfast. 07/26/22   Ghimire, Werner Lean, MD  Continuous Blood Gluc Sensor (FREESTYLE LIBRE 2 SENSOR) MISC USE AS DIRECTED. REAPPLY EVERY 14 DAYS Patient taking differently: Inject 1 Device into the skin every 14 (fourteen) days. 11/20/21   Romero Belling, MD  gabapentin (NEURONTIN) 100 MG capsule Take 100 mg by mouth 2 (two) times daily.    [provider]  guaiFENesin (MUCINEX PO) Take 2 tablets by mouth daily.    [provider]  HYDRALAZINE HCL PO Take 1 tablet by mouth 3 (three) times daily.    [provider]  levETIRAcetam (KEPPRA) 500 MG tablet Take 500 mg by mouth in the morning.    [provider]  metoprolol tartrate (LOPRESSOR) 100 MG tablet Take 100 mg by mouth 2 (two) times daily. 08/03/20   [provider]  mirtazapine (REMERON) 15 MG tablet Take 15 mg by mouth at bedtime.     [provider]  Multiple Vitamins-Minerals (CENTRUM SILVER 50+WOMEN) TABS Take 1 tablet by mouth daily with breakfast.    [provider]  omeprazole (PRILOSEC) 20 MG capsule Take 20 mg by mouth every  Monday, Wednesday, and Friday.    [provider]  polyethylene glycol powder (MIRALAX) 17 GM/SCOOP powder Take 17 g by mouth 2 (two) times daily as needed for moderate constipation or mild constipation. 08/02/22   Almon Hercules, MD  senna-docusate (SENOKOT-S) 8.6-50 MG tablet Take 1 tablet by mouth 2 (two) times daily between meals as needed for moderate constipation. 08/02/22   Almon Hercules, MD      Allergies    Ace inhibitors, Prandin [repaglinide], Dilaudid [hydromorphone hcl], Other, Latex, and Tape    Review of Systems   Review  of Systems  All other systems reviewed and are negative.   Physical Exam Updated Vital Signs BP (!) 169/69   Pulse 82   Temp 97.8 F (36.6 C) (Oral)   Resp 14   Ht  (1.549 m)   Wt 68.5 kg   SpO2 97%   BMI 28.53 kg/m  Physical Exam Vitals and nursing note reviewed.  Constitutional:      General: She is not in acute distress.    Appearance: She is well-developed.     Comments: Elderly female laying in bed with eyes closed but easily arousable answer question.  She does not appear to be in any acute discomfort.  Currently wearing supplemental oxygen.  HENT:     Head: Atraumatic.     Mouth/Throat:     Mouth: Mucous membranes are dry.  Eyes:     Extraocular Movements: Extraocular movements intact.     Conjunctiva/sclera: Conjunctivae normal.     Pupils: Pupils are equal, round, and reactive to light.  Neck:     Comments: JVD noted Cardiovascular:     Rate and Rhythm: Normal rate and regular rhythm.  Pulmonary:     Effort: Pulmonary effort is normal.     Breath sounds: No wheezing, rhonchi or rales.  Abdominal:     Palpations: Abdomen is soft.     Tenderness: There is no abdominal tenderness.  Musculoskeletal:     Cervical back: Normal range of motion and neck supple.     Right lower leg: Edema present.     Left lower leg: Edema present.     Comments: Moving all 4 extremities with equal effort.  1+ pitting edema to bilateral lower extremities  Skin:    Findings: No rash.  Neurological:     Mental Status: She is alert. Mental status is at baseline.  Psychiatric:        Mood and Affect: Mood normal.     ED Results / Procedures / Treatments   Labs (all labs ordered are listed, but only abnormal results are displayed) Labs Reviewed  BASIC METABOLIC PANEL - Abnormal; Notable for the following components:      Result Value   Glucose, Bld 125 (*)    BUN 41 (*)    Creatinine, Ser 2.31 (*)    GFR, Estimated 19 (*)    All other components within normal limits   BRAIN NATRIURETIC PEPTIDE - Abnormal; Notable for the following components:   B Natriuretic Peptide 1,969.7 (*)    All other components within normal limits  CBC - Abnormal; Notable for the following components:   WBC 3.7 (*)    RBC 3.42 (*)    Hemoglobin 10.7 (*)    HCT 33.2 (*)    RDW 16.0 (*)    All other components within normal limits  I-STAT VENOUS BLOOD GAS, ED - Abnormal; Notable for the following components:   pO2, Ven <  15 (*)    All other components within normal limits  MAGNESIUM  LEVETIRACETAM LEVEL    EKG EKG Interpretation  Date/Time:  Thursday November 15 2022 11:16:08 EDT Ventricular Rate:  62 PR Interval:  206 QRS Duration: 169 QT Interval:  491 QTC Calculation: 499 R Axis:   -70 Text Interpretation: Sinus rhythm Right bundle branch block Inferior infarct, old when compared to prior, previous diffuse t wave inversions have improved. No STEMI Confirmed by Theda Belfast ([id number]) on 11/15/2022 11:24:03 AM  Radiology DG Chest Portable 1 View  Addendum Date: 11/15/2022   ADDENDUM REPORT: 11/15/2022 12:52 Electronically Signed   By: Lupita Raider M.D.   On: 11/15/2022 12:52   Result Date: 11/15/2022 CLINICAL DATA:  Shortness of breath. EXAM: PORTABLE CHEST 1 VIEW COMPARISON:  July 31, 2022. FINDINGS: Stable cardiomegaly. Minimal right basilar subsegmental atelectasis or possibly infiltrate is noted. Minimal left lingular subsegmental atelectasis or scarring is noted. Degenerative changes are seen involving the right glenohumeral joint. IMPRESSION: Minimal right basilar subsegmental atelectasis or possibly infiltrate. Minimal left lingular subsegmental atelectasis or scarring. Aortic Atherosclerosis (ICD10-I70.0). Electronically Signed: By: Lupita Raider M.D. On: 11/15/2022 12:25    Procedures .Critical Care  Performed by: Fayrene Helper, PA-C Authorized by: Fayrene Helper, PA-C   Critical care provider statement:    Critical care time (minutes):  35   Critical  care was time spent personally by me on the following activities:  Development of treatment plan with patient or surrogate, discussions with consultants, evaluation of patient's response to treatment, examination of patient, ordering and review of laboratory studies, ordering and review of radiographic studies, ordering and performing treatments and interventions, pulse oximetry, re-evaluation of patient's condition and review of old charts     Medications Ordered in ED Medications  furosemide (LASIX) injection 40 mg (40 mg Intravenous Given 11/15/22 1250)    ED Course/ Medical Decision Making/ A&P                             Medical Decision Making Amount and/or Complexity of Data Reviewed Labs: ordered. Radiology: ordered.  Risk Prescription drug management. Decision regarding hospitalization.   BP (!) 175/87   Pulse 63   Temp 97.8 F (36.6 C) (Oral)   Resp (!) 27   Ht 5\' 1"  (1.549 m)   Wt 68.5 kg   SpO2 (!) 88%   BMI 28.53 kg/m   107:68 AM  89 year old female significant history of diabetes, hypertension, dementia, brought in by family member with complaints of bodyaches.  History obtained through daughter who is at bedside.  For the past week daughter noticed that patient is having decrease in appetite, not eating and drinking much.  She is also less active than usual, sleeping in bed mostly.  Daughter also noticed that her leg swelling has increased.  States that she has history of CHF and does take Lasix on a regular basis.  Daughter also mentioned that patient is prone for UTI but she has not complained of any urinary symptoms.  No report of any runny nose sneezing or coughing no complaints of chest pain or shortness of breath no complaints of abdominal pain.  No recent change in medication.  Daughter worries that patient may have some electrolyte abnormalities given the fact that she is not eating and drinking quite as much.  Patient suffered a right femoral fracture several  months prior and is currently going through physical therapy for  that.  She uses a walker to walk at home and use a wheelchair to travel about.  On exam this is an elderly female laying in bed in no acute discomfort.  She is however wearing a nasal cannula as her initial O2 sats was 88% on room air.  She does not normally wear oxygen.  She does not endorse any significant shortness of breath and her lungs otherwise clear to auscultation bilaterally.  Heart with normal rate and rhythm, abdomen is soft and nontender.  Patient does have trace peripheral edema to bilateral lower extremities without significant calf tenderness skin erythema concerning for DVT or cellulitis.  She is mentating at baseline.  Patient is a full code.  Vital sign review notable for elevated blood pressure of 175/87.  She is tachypneic on vital signs with a respiratory rate of 27, and is hypoxic with an O2 sats of 88%.  She was placed on 2 L of nasal cannula with improvement of her O2 sats to 95%  -Labs ordered, independently viewed and interpreted by me.  Labs remarkable for Cr 2.31, worse than prior. Normal pH. BNP elevated at 1,969 consistent with CHF exacerbation.  -The patient was maintained on a cardiac monitor.  I personally viewed and interpreted the cardiac monitored which showed an underlying rhythm of: NSR -Imaging independently viewed and interpreted by me and I agree with radiologist's interpretation.  Result remarkable for CXR showing minimal right basilar atelectasis vs infiltrates.  Pt however without cough or fever, doubt pna. -This patient presents to the ED for concern of leg swelling, this involves an extensive number of treatment options, and is a complaint that carries with it a high risk of complications and morbidity.  The differential diagnosis includes CHF exacerbation, DVT, peripheral edema, cellulitis, sprain, strain -Co morbidities that complicate the patient evaluation includes CHF, GERD, DM -Treatment  includes lasix -Reevaluation of the patient after these medicines showed that the patient improved -PCP office notes or outside notes reviewed -Discussion with [facility] Dr. Maryfrances Bunnell who agrees to admit pt -Escalation to admission/observation considered: patient and family is agreeable to admission.    2:19 PM Workup remarkable for CHF exacerbation as BNP is markedly elevated.  Chest x-ray shows signs of atelectasis versus focal infiltrate.  I felt patient symptoms not really consistent with pneumonia at this time therefore I will hold off on antibiotic.  She was giving Lasix to help with fluid diuresis.  I have consulted for admission due to acute respiratory failure in the setting of CHF exacerbation.         Final Clinical Impression(s) / ED Diagnoses Final diagnoses:  Acute on chronic diastolic CHF (congestive heart failure)  AKI (acute kidney injury)  Hypoxia    Rx / DC Orders ED Discharge Orders     None         Fayrene Helper, PA-C 11/15/22 1422    Tegeler, Canary Brim, MD 11/15/22 1610

## 2022-11-15 NOTE — Progress Notes (Signed)
   11/15/22 1749  Vitals  Temp 98.4 F (36.9 C)  Temp Source Oral  BP (!) 220/91  MAP (mmHg) 120  BP Location Right Arm  BP Method Automatic  Patient Position (if appropriate) Lying  Pulse Rate 64  Pulse Rate Source Monitor  ECG Heart Rate 63  Resp 18  MEWS COLOR  MEWS Score Color Yellow  Oxygen Therapy  SpO2 91 %  O2 Device Room Air  Pain Assessment  Pain Scale 0-10  Pain Score 0  Height and Weight  Height  (1.575 m)  Weight 70.5 kg  Type of Scale Used Standing  Type of Weight Actual  BSA (Calculated - sq m) 1.76 sq meters  BMI (Calculated) 28.42  Weight in (lb) to have BMI = 25 136.4  MEWS Score  MEWS Temp 0  MEWS Systolic 2  MEWS Pulse 0  MEWS RR 0  MEWS LOC 0  MEWS Score 2   Patient arrived from [facility] with VS above. RN paged [facility] MD that patient has arrived onto the unit and notified of current VS. MD to round to assess patient.

## 2022-11-15 NOTE — ED Notes (Signed)
Pt on cardiac monitor, call light within reach. Patient updated on plan of care. Will continue to monitor patient.

## 2022-11-15 NOTE — H&P (Addendum)
History and Physical  Patient: [name] ZOX:[id number] DOB: May 17, 1929 DOA: 11/15/2022 DOS: the patient was seen and examined on 11/15/2022 Patient coming from: Home  Chief Complaint: Concern for poor p.o. intake as well as worsening swelling of the leg with cough at night ED TRIAGE note : "Present here for multiple complaints.  Bilateral leg swelling.  Has CHF.  Weakness.  Becoming more confused than normal.  Daughter states she thin she my be dehydrated  " HPI: [name] is a 89 y.o. female with PMH significant of chronic diastolic CHF, CKD 4, emphysematous cystitis, uncontrolled hypertension, chronic pain, seizure disorder, gout. Patient was brought in by daughter secondary to complaints of poor p.o. intake as well as swelling in the leg. Patient has known history of CHF.  Daughter is a primary caregiver.  Last weight on 11/01/2022 was 141 pound.  Her appetite was initially good last week but since last 4 to 5 days she has not been eating or drinking much.  Daughter also noted increasing swelling of the leg.  Also noted cough especially at nighttime when she was sleeping. Patient did not have any complaints of chest pain at home but patient does report some chest tightness here in the hospital ongoing since last few days.  No nausea or vomiting. No recent changes in medications reported by the daughter. Patient is compliant with her Lasix. Since last 3 to 4 days patient has not been eating and drinking well.  Patient wanted to be left alone for last few days. Daughter reported that there was some episode of confusion as well. No burning urination.  No fever no chills. Was recently started on Macrobid 1 month ago. Patient was on melatonin which daughter stopped giving as she felt that it was not helping her sleep. No other change in the medications reported as well. Swelling today appears to be better compared to this morning as well as  yesterday. Daughter reports patient has 2 loose BM every  day. Patient denies any burning urination.  Review of Systems: As mentioned in the history of present illness. All other systems reviewed and are negative.  Prior to Admission medications   Medication Sig Start Date End Date Taking? Authorizing Provider  acetaminophen (TYLENOL) 500 MG tablet Take 2 tablets (1,000 mg total) by mouth every 8 (eight) hours as needed for mild pain or moderate pain. Patient taking differently: Take 1,000 mg by mouth 2 (two) times daily as needed for mild pain or moderate pain. 07/25/22  Yes Ghimire, Werner Lean, MD  allopurinol (ZYLOPRIM) 100 MG tablet Take 100 mg by mouth 2 (two) times daily.   Yes [provider]  amLODipine (NORVASC) 10 MG tablet Take 10 mg by mouth daily.   Yes [provider]  furosemide (LASIX) 40 MG tablet Take 40 mg by mouth daily. 08/31/22  Yes [provider]  gabapentin (NEURONTIN) 100 MG capsule Take 100 mg by mouth 2 (two) times daily.   Yes [provider]  HYDRALAZINE HCL PO Take 1 tablet by mouth 3 (three) times daily.   Yes [provider]  levETIRAcetam (KEPPRA) 500 MG tablet Take 500 mg by mouth 2 (two) times daily.   Yes [provider]  metoprolol tartrate (LOPRESSOR) 100 MG tablet Take 100 mg by mouth 2 (two) times daily. 08/03/20  Yes [provider]  mirtazapine (REMERON) 15 MG tablet Take 15 mg by mouth at bedtime.    Yes [provider]  Multiple Vitamins-Minerals (CENTRUM SILVER  50+WOMEN) TABS Take 1 tablet by mouth daily with breakfast.   Yes [provider]  nitrofurantoin, macrocrystal-monohydrate, (MACROBID) 100 MG capsule Take 100 mg by mouth daily.   Yes [provider]  omeprazole (PRILOSEC) 20 MG capsule Take 20 mg by mouth every Monday, Wednesday, and Friday.   Yes [provider]  QUEtiapine (SEROQUEL) 25 MG tablet Take 25 mg by mouth daily. 08/31/22  Yes [provider]  blood glucose meter kit and supplies KIT  Dispense based on patient and insurance preference. Use up to four times daily as directed. (FOR ICD-9 250.00, 250.01). For QAC - HS accuchecks. 11/27/20   Leroy Sea, MD  Continuous Blood Gluc Sensor (FREESTYLE LIBRE 2 SENSOR) MISC USE AS DIRECTED. REAPPLY EVERY 14 DAYS Patient taking differently: Inject 1 Device into the skin every 14 (fourteen) days. 11/20/21   Romero Belling, MD    Past Medical History:  Diagnosis Date   Dementia    Diabetes mellitus    GERD (gastroesophageal reflux disease)    Glaucoma    Heart disease    Hypertension    Tinnitus    Past Surgical History:  Procedure Laterality Date   BREAST LUMPECTOMY     bil for breast ca   ORIF FEMUR FRACTURE Right 07/03/2022   Procedure: OPEN REDUCTION INTERNAL FIXATION (ORIF) DISTAL FEMUR FRACTURE;  Surgeon: Myrene Galas, MD;  Location: [facility] OR;  Service: Orthopedics;  Laterality: Right;   Social History:  reports that she has quit smoking. She has never used smokeless tobacco. She reports that she does not drink alcohol and does not use drugs. Allergies  Allergen Reactions   Ace Inhibitors Swelling, Rash and Cough   Prandin [Repaglinide] Other (See Comments)    Caused significant peripheral edema   Dilaudid [Hydromorphone Hcl] Itching and Other (See Comments)    Hallucinations (auditory and visual) also   Other Nausea And Vomiting    "Tussionex Pennkinetic ER"   Latex Rash   Tape Rash    Prefers PAPER TAPE, PLEASE!!   Family History  Problem Relation Age of Onset   Diabetes Sister    Physical Exam: Vitals:   11/15/22 1530 11/15/22 1650 11/15/22 1749 11/15/22 1841  BP: (!) 174/76  (!) 220/91 (!) 176/100  Pulse:   64   Resp:   18   Temp:  98.6 F (37 C) 98.4 F (36.9 C)   TempSrc:  Oral Oral   SpO2:   91%   Weight:   70.5 kg   Height:    (1.575 m)    General: Appear in mild distress; no visible Abnormal Neck Mass Or lumps, Conjunctiva normal Cardiovascular: S1 and S2 Present, no  Murmur, Respiratory: good respiratory effort, Bilateral Air entry present and faint basal crackles, no wheezes Abdomen: Bowel Sound present, epigastric and suprapubic tenderness which improved after urination but still present. Extremities: bilateral trace pedal edema Neurology: alert and oriented to time, place, and person.  Able to ambulate in the bathroom with use of a walker.  Positive asterixis. Gait not checked due to patient safety concerns   Data Reviewed: I have reviewed ED notes, Vitals, Lab results and outpatient records. Since last encounter, pertinent lab results CBC and BMP   . I have ordered test including CBC, BMP, urine studies  . I have ordered imaging echocardiogram  .   Assessment and Plan Acute on chronic diastolic CHF (congestive heart failure) Chest x-ray shows possible congestion versus atelectasis. Currently does not appear to have  any infection. Was saturating 88% but currently 93% on room air. Received 40 mg IV Lasix. As the daughter reports patient has poor p.o. intake and with acute kidney injury I am hesitant to give her a further IV Lasix for now. Would recommend to reevaluate tomorrow to decide further requirement of IV Lasix. Per daughter her weight was 141 pounds 14 days ago and is currently 151 pounds.  Chest tightness. EKG unremarkable.  Symptoms have been present for last few days.  Less likely cardiac in nature. Recheck troponin. Add Imdur. Echocardiogram already ordered.  Not on any aspirin prior to admission.  Acute renal failure superimposed on stage 4 chronic kidney disease Baseline serum creatinine around 1.7. On admission serum creatinine 2.3. Will check ultrasound renal. Check urine studies to differentiate between cardiorenal hemodynamics versus prerenal.  Epigastric tenderness. Etiology not clear. Symptoms improved but did not resolve after urination. Will check x-ray abdomen. Check lipase level as well.  Type 2 diabetes mellitus  with complication, without long-term current use of insulin Not on any medications at home. Will monitor for now.  Hypertension Blood pressure significantly elevated. Will resume home regimen including Norvasc 10 mg, metoprolol 100 mg twice daily. Increase hydralazine to 75 mg 3 times daily.  GERD (gastroesophageal reflux disease) Continue PPI daily.  Dementia Mentation appears to be stable.  Does not have any behavioral issues. Appears to be on Seroquel at night which I will continue.  History of seizures. On Keppra. Which I will continue.  Chronic pain. On gabapentin 100 mg twice daily. Patient does have some obstruction at the time of examination likely from renal accumulation from AKI. Will reduce gabapentin dose to 100 nightly.  Recurrent UTI. On Macrobid started recently. Given her worsening renal function not a good candidate to continue this medication for now. Especially in the setting of her abdominal pain and poor p.o. intake.  History of femur fracture Severe pulmonary hypertension    Advance Care Planning:   Code Status: Full Code discussed with daughter in detail.  In her mind patient is "no code" but she wants to maintain "full code" as she thinks that her full CODE STATUS was responsible for the attention of care that she receives, especially for her transfer from SNF to hospital recently.  Consults: None Family Communication: Daughter at bedside  Author: Lynden Oxford, MD 11/15/2022 8:00 PM For on call review [email or web address].

## 2022-11-15 NOTE — Care Plan (Signed)
Mrs. Setter is a 89 y.o. F with dementia, CKD IV, dCHF, HTN, DM, seizures and history of emphysematous cystitis last Dec who presented with weakness, confusion, leg swelling.  CXR with atelectasis vs infiltrates.  BNP >1900.  ECG old RBBB.  SpO2 88%  Given IV Lasix and hospitalist service asked to evaluate. 

## 2022-11-15 NOTE — ED Notes (Signed)
Kiana with cl called for transport 

## 2022-11-15 NOTE — ED Notes (Signed)
ED TO INPATIENT HANDOFF REPORT  ED Nurse Name and Phone #: Clydie Braun, [phone]  S Name/Age/Gender [name] 89 y.o. female Room/Bed: DB012/DB012  Code Status   Code Status: Prior  Home/SNF/Other [facility] Patient oriented to: self Is this baseline? Yes   Triage Complete: Triage complete  Chief Complaint Acute on chronic diastolic CHF (congestive heart failure) [I50.33]  Triage Note Present here for multiple complaints.  Bilateral leg swelling.  Has CHF.  Weakness.  Becoming more confused than normal.  Daughter states she thin she my be dehydrated    Allergies Allergies  Allergen Reactions   Ace Inhibitors Swelling, Rash and Cough   Prandin [Repaglinide] Other (See Comments)    Caused significant peripheral edema   Dilaudid [Hydromorphone Hcl] Itching and Other (See Comments)    Hallucinations (auditory and visual) also   Other Nausea And Vomiting    "Tussionex Pennkinetic ER"   Latex Rash   Tape Rash    Prefers PAPER TAPE, PLEASE!!    Level of Care/Admitting Diagnosis ED Disposition     ED Disposition  Admit   Condition  --   Comment  Hospital [facility] [[id number]]  Level of Care: Telemetry Medical [104]  Interfacility transfer: Yes  May place patient in observation at [facility] or [facility] if equivalent level of care is available:: Yes  Covid Evaluation: Asymptomatic - no recent exposure (last 10 days) testing not required  Diagnosis: Acute on chronic diastolic CHF (congestive heart failure) [500370]  Admitting Physician: Alberteen Sam [[id number]]  Attending Physician: Alberteen Sam [[id number]]          B Medical/Surgery History Past Medical History:  Diagnosis Date   Dementia    Diabetes mellitus    GERD (gastroesophageal reflux disease)    Glaucoma    Heart disease    Hypertension    Tinnitus    Past Surgical History:  Procedure Laterality Date   BREAST LUMPECTOMY     bil for breast ca    ORIF FEMUR FRACTURE Right 07/03/2022   Procedure: OPEN REDUCTION INTERNAL FIXATION (ORIF) DISTAL FEMUR FRACTURE;  Surgeon: Myrene Galas, MD;  Location: [facility] OR;  Service: Orthopedics;  Laterality: Right;     A IV Location/Drains/Wounds Patient Lines/Drains/Airways Status     Active Line/Drains/Airways     Name Placement date Placement time Site Days   Peripheral IV 11/15/22 20 G 1" Left Antecubital 11/15/22  1216  Antecubital  less than 1            Intake/Output Last 24 hours No intake or output data in the 24 hours ending 11/15/22 1637  Labs/Imaging Results for orders placed or performed during the hospital encounter of 11/15/22 (from the past 48 hour(s))  Basic metabolic panel     Status: Abnormal   Collection Time: 11/15/22 11:30 AM  Result Value Ref Range   Sodium 137 135 - 145 mmol/L   Potassium 4.3 3.5 - 5.1 mmol/L   Chloride 105 98 - 111 mmol/L   CO2 23 22 - 32 mmol/L   Glucose, Bld 125 (H) 70 - 99 mg/dL    Comment: Glucose reference range applies only to samples taken after fasting for at least 8 hours.   BUN 41 (H) 8 - 23 mg/dL   Creatinine, Ser 8.28 (H) 0.44 - 1.00 mg/dL   Calcium 9.7 8.9 - 00.3 mg/dL   GFR, Estimated 19 (L) >60 mL/min    Comment: (NOTE) Calculated using the CKD-EPI  Creatinine Equation (2021)    Anion gap 9 5 - 15    Comment: Performed at [facility], [address], [place], [place] [address]  Magnesium     Status: None   Collection Time: 11/15/22 11:30 AM  Result Value Ref Range   Magnesium 2.1 1.7 - 2.4 mg/dL    Comment: Performed at [facility], [address] [place], [place] [address]  Brain natriuretic peptide (order ONLY if patient c/o SOB)     Status: Abnormal   Collection Time: 11/15/22 11:30 AM  Result Value Ref Range   B Natriuretic Peptide 1,969.7 (H) 0.0 - 100.0 pg/mL    Comment: Performed at [facility], [address], [place], [place] [address]  CBC      Status: Abnormal   Collection Time: 11/15/22 11:30 AM  Result Value Ref Range   WBC 3.7 (L) 4.0 - 10.5 K/uL   RBC 3.42 (L) 3.87 - 5.11 MIL/uL   Hemoglobin 10.7 (L) 12.0 - 15.0 g/dL   HCT 46.9 (L) 62.9 - 52.8 %   MCV 97.1 80.0 - 100.0 fL   MCH 31.3 26.0 - 34.0 pg   MCHC 32.2 30.0 - 36.0 g/dL   RDW 41.3 (H) 24.4 - 01.0 %   Platelets 249 150 - 400 K/uL   nRBC 0.0 0.0 - 0.2 %    Comment: Performed at [facility], [address] [name], [place] [address]  I-Stat venous blood gas, ([facility], [facility], [facility])     Status: Abnormal   Collection Time: 11/15/22 12:19 PM  Result Value Ref Range   pH, Ven 7.307 7.25 - 7.43   pCO2, Ven 50.9 44 - 60 mmHg   pO2, Ven <15 (LL) 32 - 45 mmHg   Bicarbonate 25.5 20.0 - 28.0 mmol/L   TCO2 27 22 - 32 mmol/L   O2 Saturation 16 %   Acid-base deficit 1.0 0.0 - 2.0 mmol/L   Sodium 138 135 - 145 mmol/L   Potassium 4.3 3.5 - 5.1 mmol/L   Calcium, Ion 1.34 1.15 - 1.40 mmol/L   HCT 36.0 36.0 - 46.0 %   Hemoglobin 12.2 12.0 - 15.0 g/dL   Patient temperature 66.4 C    Collection site IV start    Drawn by RT    Sample type VENOUS    Comment NOTIFIED PHYSICIAN    DG Chest Portable 1 View  Addendum Date: 11/15/2022   ADDENDUM REPORT: 11/15/2022 12:52 Electronically Signed   By: Lupita Raider M.D.   On: 11/15/2022 12:52   Result Date: 11/15/2022 CLINICAL DATA:  Shortness of breath. EXAM: PORTABLE CHEST 1 VIEW COMPARISON:  July 31, 2022. FINDINGS: Stable cardiomegaly. Minimal right basilar subsegmental atelectasis or possibly infiltrate is noted. Minimal left lingular subsegmental atelectasis or scarring is noted. Degenerative changes are seen involving the right glenohumeral joint. IMPRESSION: Minimal right basilar subsegmental atelectasis or possibly infiltrate. Minimal left lingular subsegmental atelectasis or scarring. Aortic Atherosclerosis (ICD10-I70.0). Electronically Signed: By: Lupita Raider M.D. On: 11/15/2022 12:25    Pending  Labs Unresulted Labs (From admission, onward)     Start     Ordered   11/15/22 1227  Levetiracetam level  Once,   URGENT        11/15/22 1226            Vitals/Pain Today's Vitals   11/15/22 1112 11/15/22 1117 11/15/22 1300 11/15/22 1320  BP:  (!) 175/87 (!) 169/69   Pulse:  63 82  Resp:  (!) 27 14   Temp:  97.8 F (36.6 C)    TempSrc:  Oral    SpO2:  (!) 88% 97%   Weight: 68.5 kg     Height:  (1.549 m)     PainSc:    Asleep    Isolation Precautions No active isolations  Medications Medications  furosemide (LASIX) injection 40 mg (40 mg Intravenous Given 11/15/22 1250)    Mobility power wheelchair     Focused Assessments Bilateral swelling, cardiac monitoring   R Recommendations: See Admitting Provider Note  Report given to:   Additional Notes: Thank you!  Call with any questions, pt ao, ambulatory to bedside commode with assistance

## 2022-11-15 NOTE — ED Notes (Signed)
Pt using Bedside commode

## 2022-11-15 NOTE — ED Triage Notes (Signed)
Present here for multiple complaints.  Bilateral leg swelling.  Has CHF.  Weakness.  Becoming more confused than normal.  Daughter states she thin she my be dehydrated

## 2022-11-16 ENCOUNTER — Observation Stay (HOSPITAL_COMMUNITY): Payer: PPO

## 2022-11-16 DIAGNOSIS — Z794 Long term (current) use of insulin: Secondary | ICD-10-CM | POA: Diagnosis not present

## 2022-11-16 DIAGNOSIS — G40909 Epilepsy, unspecified, not intractable, without status epilepticus: Secondary | ICD-10-CM | POA: Diagnosis present

## 2022-11-16 DIAGNOSIS — I071 Rheumatic tricuspid insufficiency: Secondary | ICD-10-CM | POA: Diagnosis present

## 2022-11-16 DIAGNOSIS — M109 Gout, unspecified: Secondary | ICD-10-CM | POA: Diagnosis present

## 2022-11-16 DIAGNOSIS — Z66 Do not resuscitate: Secondary | ICD-10-CM | POA: Diagnosis not present

## 2022-11-16 DIAGNOSIS — I5033 Acute on chronic diastolic (congestive) heart failure: Secondary | ICD-10-CM | POA: Diagnosis present

## 2022-11-16 DIAGNOSIS — N184 Chronic kidney disease, stage 4 (severe): Secondary | ICD-10-CM | POA: Diagnosis present

## 2022-11-16 DIAGNOSIS — Z91040 Latex allergy status: Secondary | ICD-10-CM | POA: Diagnosis not present

## 2022-11-16 DIAGNOSIS — I272 Pulmonary hypertension, unspecified: Secondary | ICD-10-CM | POA: Diagnosis present

## 2022-11-16 DIAGNOSIS — Z87891 Personal history of nicotine dependence: Secondary | ICD-10-CM | POA: Diagnosis not present

## 2022-11-16 DIAGNOSIS — I10 Essential (primary) hypertension: Secondary | ICD-10-CM | POA: Diagnosis not present

## 2022-11-16 DIAGNOSIS — E118 Type 2 diabetes mellitus with unspecified complications: Secondary | ICD-10-CM | POA: Diagnosis not present

## 2022-11-16 DIAGNOSIS — F03C0 Unspecified dementia, severe, without behavioral disturbance, psychotic disturbance, mood disturbance, and anxiety: Secondary | ICD-10-CM | POA: Diagnosis present

## 2022-11-16 DIAGNOSIS — N179 Acute kidney failure, unspecified: Secondary | ICD-10-CM | POA: Diagnosis present

## 2022-11-16 DIAGNOSIS — Z888 Allergy status to other drugs, medicaments and biological substances status: Secondary | ICD-10-CM | POA: Diagnosis not present

## 2022-11-16 DIAGNOSIS — I160 Hypertensive urgency: Secondary | ICD-10-CM | POA: Diagnosis present

## 2022-11-16 DIAGNOSIS — R0902 Hypoxemia: Secondary | ICD-10-CM | POA: Diagnosis present

## 2022-11-16 DIAGNOSIS — Z79899 Other long term (current) drug therapy: Secondary | ICD-10-CM | POA: Diagnosis not present

## 2022-11-16 DIAGNOSIS — K219 Gastro-esophageal reflux disease without esophagitis: Secondary | ICD-10-CM | POA: Diagnosis present

## 2022-11-16 DIAGNOSIS — N390 Urinary tract infection, site not specified: Secondary | ICD-10-CM | POA: Diagnosis present

## 2022-11-16 DIAGNOSIS — Z515 Encounter for palliative care: Secondary | ICD-10-CM | POA: Diagnosis not present

## 2022-11-16 DIAGNOSIS — Z7189 Other specified counseling: Secondary | ICD-10-CM | POA: Diagnosis not present

## 2022-11-16 DIAGNOSIS — G8929 Other chronic pain: Secondary | ICD-10-CM | POA: Diagnosis present

## 2022-11-16 DIAGNOSIS — Z885 Allergy status to narcotic agent status: Secondary | ICD-10-CM | POA: Diagnosis not present

## 2022-11-16 DIAGNOSIS — E1122 Type 2 diabetes mellitus with diabetic chronic kidney disease: Secondary | ICD-10-CM | POA: Diagnosis present

## 2022-11-16 DIAGNOSIS — Z91048 Other nonmedicinal substance allergy status: Secondary | ICD-10-CM | POA: Diagnosis not present

## 2022-11-16 DIAGNOSIS — Z87440 Personal history of urinary (tract) infections: Secondary | ICD-10-CM | POA: Diagnosis not present

## 2022-11-16 DIAGNOSIS — I130 Hypertensive heart and chronic kidney disease with heart failure and stage 1 through stage 4 chronic kidney disease, or unspecified chronic kidney disease: Secondary | ICD-10-CM | POA: Diagnosis present

## 2022-11-16 DIAGNOSIS — D631 Anemia in chronic kidney disease: Secondary | ICD-10-CM | POA: Diagnosis present

## 2022-11-16 LAB — ECHOCARDIOGRAM COMPLETE
AR max vel: 2.16 cm2
AV Area VTI: 2.15 cm2
AV Area mean vel: 2.19 cm2
AV Mean grad: 3.5 mmHg
AV Peak grad: 7.2 mmHg
Ao pk vel: 1.35 m/s
Area-P 1/2: 1.9 cm2
Calc EF: 68.4 %
Height: 62 in
MV VTI: 1.84 cm2
S' Lateral: 2.3 cm
Single Plane A2C EF: 68.1 %
Single Plane A4C EF: 69.4 %
Weight: 2462.4 oz

## 2022-11-16 LAB — COMPREHENSIVE METABOLIC PANEL
ALT: 33 U/L (ref 0–44)
AST: 38 U/L (ref 15–41)
Albumin: 2.8 g/dL — ABNORMAL LOW (ref 3.5–5.0)
Alkaline Phosphatase: 167 U/L — ABNORMAL HIGH (ref 38–126)
Anion gap: 6 (ref 5–15)
BUN: 39 mg/dL — ABNORMAL HIGH (ref 8–23)
CO2: 24 mmol/L (ref 22–32)
Calcium: 8.9 mg/dL (ref 8.9–10.3)
Chloride: 106 mmol/L (ref 98–111)
Creatinine, Ser: 2.23 mg/dL — ABNORMAL HIGH (ref 0.44–1.00)
GFR, Estimated: 20 mL/min — ABNORMAL LOW (ref >60)
Glucose, Bld: 123 mg/dL — ABNORMAL HIGH (ref 70–99)
Potassium: 3.7 mmol/L (ref 3.5–5.1)
Sodium: 136 mmol/L (ref 135–145)
Total Bilirubin: 0.4 mg/dL (ref 0.3–1.2)
Total Protein: 6 g/dL — ABNORMAL LOW (ref 6.5–8.1)

## 2022-11-16 LAB — URINALYSIS, ROUTINE W REFLEX MICROSCOPIC
Bilirubin Urine: NEGATIVE
Glucose, UA: NEGATIVE mg/dL
Hgb urine dipstick: NEGATIVE
Ketones, ur: NEGATIVE mg/dL
Nitrite: NEGATIVE
Protein, ur: 100 mg/dL — AB
Specific Gravity, Urine: 1.011 (ref 1.005–1.030)
WBC, UA: >50 WBC/hpf (ref 0–5)
pH: 5 (ref 5.0–8.0)

## 2022-11-16 LAB — CBC
HCT: 29.7 % — ABNORMAL LOW (ref 36.0–46.0)
Hemoglobin: 9.5 g/dL — ABNORMAL LOW (ref 12.0–15.0)
MCH: 31 pg (ref 26.0–34.0)
MCHC: 32 g/dL (ref 30.0–36.0)
MCV: 97.1 fL (ref 80.0–100.0)
Platelets: 217 10*3/uL (ref 150–400)
RBC: 3.06 MIL/uL — ABNORMAL LOW (ref 3.87–5.11)
RDW: 15.6 % — ABNORMAL HIGH (ref 11.5–15.5)
WBC: 4.6 10*3/uL (ref 4.0–10.5)
nRBC: 0 % (ref 0.0–0.2)

## 2022-11-16 LAB — GLUCOSE, CAPILLARY
Glucose-Capillary: 93 mg/dL (ref 70–99)
Glucose-Capillary: 113 mg/dL — ABNORMAL HIGH (ref 70–99)

## 2022-11-16 LAB — NA AND K (SODIUM & POTASSIUM), RAND UR
Potassium Urine: 26 mmol/L
Sodium, Ur: 70 mmol/L

## 2022-11-16 LAB — MAGNESIUM: Magnesium: 2.1 mg/dL (ref 1.7–2.4)

## 2022-11-16 LAB — CREATININE, URINE, RANDOM: Creatinine, Urine: 77 mg/dL

## 2022-11-16 MED ORDER — QUETIAPINE FUMARATE 25 MG PO TABS
25.0000 mg | ORAL_TABLET | Freq: Every day | ORAL | Status: DC
  Administered 2022-11-17: 25 mg via ORAL
  Filled 2022-11-16: qty 1

## 2022-11-16 MED ORDER — FUROSEMIDE 10 MG/ML IJ SOLN
20.0000 mg | Freq: Once | INTRAMUSCULAR | Status: AC
  Administered 2022-11-16: 20 mg via INTRAVENOUS
  Filled 2022-11-16: qty 2

## 2022-11-16 NOTE — Progress Notes (Signed)
Mobility Specialist Progress Note:   11/16/22 0950  Mobility  Activity Ambulated with assistance in room  Level of Assistance Contact guard assist, steadying assist  Assistive Device Front wheel walker  Distance Ambulated (ft) 10 ft  Activity Response Tolerated fair  Mobility Referral Yes  $Mobility charge 1 Mobility   Pt agreeable to mobility session with encouragement. Received on RA, SpO2 87%. Required contact assist throughout for safety. SpO2 >90% on 2LO2. Pt back in bed with all needs met, alarm on.   Addison Lank Mobility Specialist Please contact via SecureChat or  Rehab office at [phone]

## 2022-11-16 NOTE — Progress Notes (Signed)
Heart Failure Navigator Progress Note  Assessed for Heart & Vascular TOC clinic readiness.  Patient EF 60-65%, history of Dementia.   Navigator will sign off at this  time.   Rhae Hammock, BSN, Scientist, clinical (histocompatibility and immunogenetics) Only

## 2022-11-16 NOTE — Progress Notes (Signed)
[facility] Progress Note Patient: [name] ZOX:[id number] DOB: July 14, 1929 DOA: 11/15/2022  DOS: the patient was seen and examined on 11/16/2022  Brief hospital course:  [name] is a 89 y.o. female with PMH significant of chronic diastolic CHF, CKD 4, emphysematous cystitis, uncontrolled hypertension, chronic pain, seizure disorder, gout. Patient was brought in by daughter secondary to complaints of poor p.o. intake as well as swelling in the leg. CXR with atelectasis vs infiltrates.  BNP >1900.  ECG old RBBB.  SpO2 88%.  Currently being treated with IV diuresis. Assessment and Plan: Acute on chronic diastolic CHF Chest x-ray shows possible congestion versus atelectasis. Currently does not appear to have any infection. Was saturating 88% but currently 93% on room air. Receiving IV Lasix.  Responding well. Per daughter her weight was 141 pounds 14 days ago and is currently 151 pounds. Echocardiogram currently pending.   Chest tightness.  Resolved. EKG unremarkable.  Symptoms have been present for last few days.  Less likely cardiac in nature. Minimally elevated troponin not consistent with ACS. Continue Imdur. Echocardiogram currently pending. Not on any aspirin prior to admission.   Acute renal failure superimposed on stage 4 chronic kidney disease Baseline serum creatinine around 1.7. On admission serum creatinine 2.3. Ultrasound renal unremarkable for any hydronephrosis or obstruction. Urine studies indeterminant.  Possible prerenal versus intrinsic issues.  Urine urea pending. Serum creatinine somewhat better therefore we will continue IV diuresis and monitor.   Epigastric tenderness. Etiology not clear.  X-ray abdomen unremarkable.  Lipase normal.  LFT normal. Resolved on 4/19.  Type 2 diabetes mellitus with complication, without long-term current use of insulin Not on any medications at home.  Hemoglobin A1c 6.1. Will monitor for now.   Hypertensive urgency Blood  pressure significantly elevated. Currently blood pressure significantly better with Norvasc 10 mg, Toprol at 100 mg, hydralazine 75 mg as well as Imdur.   GERD (gastroesophageal reflux disease) Continue PPI daily.   Dementia Mentation appears to be stable.  Does not have any behavioral issues. Appears to be on Seroquel at night which I will continue.   History of seizures. On Keppra. Which I will continue.   Chronic pain. On gabapentin 100 mg twice daily. Patient does have some obstruction at the time of examination likely from renal accumulation from AKI. Will reduce gabapentin dose to 100 nightly.   Recurrent UTI. On Macrobid started recently. Given her worsening renal function not a good candidate to continue this medication for now. Especially in the setting of her abdominal pain and poor p.o. intake.   History of femur fracture Severe pulmonary hypertension   Subjective: No nausea no vomiting.  Abdominal pain.  No chest pain.  Had some cough while eating food.  Daughter does not report any similar cough while eating food at home.  Physical Exam: S1-S2 present. Bilateral basilar crackle left more than right. Improving edema of the lower extremity. Bowel sound present.  No epigastric tenderness. Alert and oriented x3, no new focal deficit  Data Reviewed: I have Reviewed nursing notes, Vitals, and Lab results. Since last encounter, pertinent lab results CBC and BMP   . I have ordered test including CBC and BMP  .   Disposition: Status is: Inpatient Remains inpatient appropriate because: Need for further diuresis and improvement in oxygenation.  heparin injection 5,000 Units Start: 11/15/22 2200   Family Communication: Discussed with daughter on the phone and caregiver at bedside Level of care: Telemetry Cardiac continue due to CHF Vitals:  11/16/22 0832 11/16/22 0842 11/16/22 1117 11/16/22 1452  BP: (!) 124/56 (!) 124/56 (!) 117/54 (!) 120/55  Pulse: 64 64 63 64   Resp: Temp: 97.8 F (36.6 C)  97.6 F (36.4 C) 97.7 F (36.5 C)  TempSrc: Oral  Oral Oral  SpO2: 91%  94% 97%  Weight:      Height:         Author: Lynden Oxford, MD 11/16/2022 3:53 PM  Please look on [email or web address] to find out who is on call.

## 2022-11-16 NOTE — Evaluation (Signed)
Physical Therapy Evaluation Patient Details Name: DICKIE LABARRE MRN: [id number] DOB: [birth year] Today's Date: 11/16/2022  History of Present Illness  Pt is a 89 y.o. female admitted 4/18 with acute on chronic diastolic heart failure. PMH: dementia, DM, GERD, glaucoma, CHF, HTN, h/o fall with distal femur fx s/o ORIF (Dec 2023)   Clinical Impression  Pt admitted with above diagnosis. PTA pt lived at home with daughter. She had 24-assist between her daughter and PCA. She ambulated short household distances with SW and used transport chair in community. Pt currently with functional limitations due to the deficits listed below (see PT Problem List). On eval, pt required mod assist bed mobility, min assist sit to stand, and min assist amb 5' with RW. Pt required 1.5L supplemental O2 to maintain SpO2 in 90s. Pt will benefit from acute skilled PT to increase their independence and safety with mobility to allow discharge.          Recommendations for follow up therapy are one component of a multi-disciplinary discharge planning process, led by the attending physician.  Recommendations may be updated based on patient status, additional functional criteria and insurance authorization.  Follow Up Recommendations       Assistance Recommended at Discharge Frequent or constant Supervision/Assistance  Patient can return home with the following  A little help with walking and/or transfers;A lot of help with bathing/dressing/bathroom;Assistance with cooking/housework;Assist for transportation;Help with stairs or ramp for entrance    Equipment Recommendations None recommended by PT  Recommendations for Other Services       Functional Status Assessment Patient has had a recent decline in their functional status and demonstrates the ability to make significant improvements in function in a reasonable and predictable amount of time.     Precautions / Restrictions Precautions Precautions: Fall;Other  (comment) Precaution Comments: watch sats      Mobility  Bed Mobility Overal bed mobility: Needs Assistance Bed Mobility: Supine to Sit     Supine to sit: HOB elevated, Mod assist     General bed mobility comments: +rail, increased time, assist with BLE and trunk    Transfers Overall transfer level: Needs assistance Equipment used: Rolling walker (2 wheels) Transfers: Sit to/from Stand, Bed to chair/wheelchair/BSC Sit to Stand: Min assist   Step pivot transfers: Min assist       General transfer comment: assist to power up, increased time to stabilize balance    Ambulation/Gait Ambulation/Gait assistance: Min assist Gait Distance (Feet): 5 Feet Assistive device: Rolling walker (2 wheels) Gait Pattern/deviations: Step-through pattern, Decreased stride length, Trunk flexed Gait velocity: decreased     General Gait Details: SpO2 90% on 1.5L. HR in 60s.  Stairs            Wheelchair Mobility    Modified Rankin (Stroke Patients Only)       Balance Overall balance assessment: Needs assistance Sitting-balance support: Feet supported, No upper extremity supported Sitting balance-Leahy Scale: Fair     Standing balance support: Bilateral upper extremity supported, During functional activity, Reliant on assistive device for balance Standing balance-Leahy Scale: Poor                               Pertinent Vitals/Pain Pain Assessment Pain Assessment: Faces Faces Pain Scale: No hurt    Home Living Family/patient expects to be discharged to:: Private residence Living Arrangements: Children Available Help at Discharge: Family;Personal care attendant;Available 24 hours/day Type of Home: House Home  Access: Ramped entrance       Home Layout: Two level;Able to live on main level with bedroom/bathroom Home Equipment: Rollator (4 wheels);Cane - single point;Shower seat;BSC/3in1;Transport chair;Standard Environmental consultant      Prior Function Prior Level of  Function : Needs assist;History of Falls (last six months)             Mobility Comments: Amb short household distances with SW. Transport chair for longer distances. ADLs Comments: Daughter and/or PCA assists with all ADLs     Hand Dominance   Dominant Hand: Right    Extremity/Trunk Assessment   Upper Extremity Assessment Upper Extremity Assessment: Generalized weakness    Lower Extremity Assessment Lower Extremity Assessment: Generalized weakness    Cervical / Trunk Assessment Cervical / Trunk Assessment: Kyphotic  Communication   Communication: HOH  Cognition Arousal/Alertness: Awake/alert Behavior During Therapy: WFL for tasks assessed/performed Overall Cognitive Status: History of cognitive impairments - at baseline                                 General Comments: dementia at baseline. Pleasantly confused. Encouragement needed to participate in therapy.        General Comments General comments (skin integrity, edema, etc.): Pt on 1.5L continuous O2. SpO2 92% at rest and 90% during activity. HR in 60s.    Exercises     Assessment/Plan    PT Assessment Patient needs continued PT services  PT Problem List Decreased strength;Decreased balance;Decreased mobility;Decreased activity tolerance       PT Treatment Interventions Gait training;Functional mobility training;Balance training;Patient/family education;Therapeutic activities;Therapeutic exercise    PT Goals (Current goals can be found in the Care Plan section)  Acute Rehab PT Goals Patient Stated Goal: home PT Goal Formulation: With patient Time For Goal Achievement: 11/30/22 Potential to Achieve Goals: Fair    Frequency Min 1X/week     Co-evaluation               AM-PAC PT "6 Clicks" Mobility  Outcome Measure Help needed turning from your back to your side while in a flat bed without using bedrails?: A Little Help needed moving from lying on your back to sitting on the  side of a flat bed without using bedrails?: A Lot Help needed moving to and from a bed to a chair (including a wheelchair)?: A Little Help needed standing up from a chair using your arms (e.g., wheelchair or bedside chair)?: A Little Help needed to walk in hospital room?: A Little Help needed climbing 3-5 steps with a railing? : A Lot 6 Click Score: 16    End of Session Equipment Utilized During Treatment: Gait belt;Oxygen Activity Tolerance: Patient tolerated treatment well Patient left: in chair;with call bell/phone within reach;with chair alarm set;with family/visitor present Nurse Communication: Mobility status PT Visit Diagnosis: Muscle weakness (generalized) (M62.81)    Time: 1610-9604 PT Time Calculation (min) (ACUTE ONLY): 13 min   Charges:   PT Evaluation $PT Eval Moderate Complexity: 1 Mod          Ferd Glassing., PT  Office # [phone]   Ilda Foil 11/16/2022, 11:28 AM

## 2022-11-16 NOTE — Plan of Care (Signed)
  Problem: Nutrition: Goal: Adequate nutrition will be maintained Outcome: Completed/Met   Problem: Elimination: Goal: Will not experience complications related to bowel motility Outcome: Completed/Met   Problem: Pain Managment: Goal: General experience of comfort will improve Outcome: Completed/Met   

## 2022-11-16 NOTE — TOC Initial Note (Signed)
Transition of Care [facility]) - Initial/Assessment Note    Patient Details  Name: [name] MRN: [id number] Date of Birth: 1929/03/21  [facility]) CM/SW Contact:    Leone Haven, RN Phone Number: 11/16/2022, 5:13 PM  Clinical Narrative:                 Patient lives with daughter  who is her support system.  Patient has all the DME she needs per daughter, daughter will transport her home at dc.  Per ambulatory sat she does not need any oxygen.  NCM offered choice, they have had [facility] and would like to use them again for HHPT.  NCM made referral to [facility] , he is able to take referral.  Soc will begin 24 to 48 hrs post dc.    Expected Discharge Plan: Home w Home Health Services Barriers to Discharge: Continued Medical Work up   Patient Goals and CMS Choice Patient states their goals for this hospitalization and ongoing recovery are:: return home with daughter CMS Medicare.gov Compare Post Acute Care list provided to:: Patient Represenative (must comment) Choice offered to / list presented to : Adult Children      Expected Discharge Plan and Services In-house Referral: NA Discharge Planning Services: CM Consult Post Acute Care Choice: Home Health Living arrangements for the past 2 months: Single Family Home                 DME Arranged: N/A DME Agency: NA       HH Arranged: PT HH Agency: [facility] Date [facility] Agency Contacted: 11/16/22 Time HH Agency Contacted: 1712 Representative spoke with at [facility] Agency: Kandee Keen  Prior Living Arrangements/Services Living arrangements for the past 2 months: Single Family Home Lives with:: Adult Children Patient language and need for interpreter reviewed:: Yes Do you feel safe going back to the place where you live?: Yes      Need for Family Participation in Patient Care: Yes (Comment) Care giver support system in place?: Yes (comment) Current home services: DME (has all the DME they need) Criminal  Activity/Legal Involvement Pertinent to Current Situation/Hospitalization: No - Comment as needed  Activities of Daily Living      Permission Sought/Granted                  Emotional Assessment Appearance:: Appears stated age     Orientation: : Oriented to Self, Oriented to Place, Oriented to  Time, Oriented to Situation Alcohol / Substance Use: Not Applicable Psych Involvement: No (comment)  Admission diagnosis:  Hypoxia [R09.02] AKI (acute kidney injury) [N17.9] Acute on chronic diastolic CHF (congestive heart failure) [I50.33] Acute on chronic diastolic (congestive) heart failure [I50.33] Patient Active Problem List   Diagnosis Date Noted   Acute on chronic diastolic (congestive) heart failure 11/16/2022   Acute on chronic diastolic CHF (congestive heart failure) 11/15/2022   Severe pulmonary hypertension 08/01/2022   Bacteriuria with pyuria 08/01/2022   Complicated UTI (urinary tract infection) 07/31/2022   Bilateral hydronephrosis 07/23/2022   Delirium 07/22/2022   History of femur fracture 07/22/2022   Essential hypertension 07/22/2022   Urinary retention 07/22/2022   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   Acute urinary retention 07/17/2022   Complex renal cyst 07/17/2022   Closed fracture of right distal femur 06/30/2022   Chronic diastolic CHF (congestive heart failure) 06/30/2022   history of seizures 06/30/2022   Dementia 03/10/2022   Heart failure 03/10/2022   Acute renal failure superimposed on stage 4  chronic kidney disease 03/10/2022   Type 2 diabetes mellitus with complication, without long-term current use of insulin    Glaucoma    Hyperglycemia 11/24/2020   UTI (urinary tract infection) 11/24/2020   Acute encephalopathy 11/24/2020   GERD (gastroesophageal reflux disease)    Hypertension    PCP:  Renford Dills, MD Pharmacy:   [facility] [phone] Ginette Otto, [place] - [address] AT [facility] [address] [facility] [address] [place]  [place] [address] Phone: [phone] Fax: [phone]  [facility] [address] [place] [address] Phone: [phone] Fax: [phone]     Social Determinants of Health (SDOH) Social History: SDOH Screenings   Food Insecurity: No Food Insecurity (07/08/2022)  Housing: Low Risk  (07/08/2022)  Transportation Needs: No Transportation Needs (07/08/2022)  Utilities: Not At Risk (07/08/2022)  Tobacco Use: Medium Risk (11/15/2022)   SDOH Interventions:     Readmission Risk Interventions    11/16/2022    5:11 PM 07/25/2022    1:23 PM  Readmission Risk Prevention Plan  Transportation Screening Complete Complete  HRI or Home Care Consult Complete   Palliative Care Screening Not Applicable   Medication Review (RN Care Manager) Complete Complete  PCP or Specialist appointment within 3-5 days of discharge  Complete  HRI or Home Care Consult  Complete  SW Recovery Care/Counseling Consult  Complete  Palliative Care Screening  Not Applicable  Skilled Nursing Facility  Patient Refused

## 2022-11-16 NOTE — Progress Notes (Signed)
SATURATION QUALIFICATIONS: (This note is used to comply with regulatory documentation for home oxygen)  Patient Saturations on Room Air at Rest = 90%  Patient Saturations on Room Air while Ambulating = 95%  Patient Saturations on 0 Liters of oxygen while Ambulating = 93%  Please briefly explain why patient needs home oxygen: Patient did not need oxygen while ambulating, however, her daughter states it is when she is resting that she desats.  Elnita Maxwell, RN

## 2022-11-16 NOTE — Progress Notes (Signed)
Nurse requested Mobility Specialist to perform oxygen saturation test with pt which includes removing pt from oxygen both at rest and while ambulating.  Below are the results from that testing.     Patient Saturations on Room Air at Rest = spO2 87%  Patient Saturations on Room Air while Ambulating = sp02 N/A% .   Patient Saturations on 2 Liters of oxygen while Ambulating = sp02 91%  At end of testing pt left in room on 2 Liters of oxygen.  Reported results to nurse.   [name] Mobility Specialist Please contact via SecureChat or  Rehab office at [phone]

## 2022-11-17 DIAGNOSIS — I5033 Acute on chronic diastolic (congestive) heart failure: Secondary | ICD-10-CM | POA: Diagnosis not present

## 2022-11-17 LAB — MAGNESIUM: Magnesium: 2.1 mg/dL (ref 1.7–2.4)

## 2022-11-17 LAB — CBC
HCT: 28.7 % — ABNORMAL LOW (ref 36.0–46.0)
Hemoglobin: 9.4 g/dL — ABNORMAL LOW (ref 12.0–15.0)
MCH: 31.4 pg (ref 26.0–34.0)
MCHC: 32.8 g/dL (ref 30.0–36.0)
MCV: 96 fL (ref 80.0–100.0)
Platelets: 207 10*3/uL (ref 150–400)
RBC: 2.99 MIL/uL — ABNORMAL LOW (ref 3.87–5.11)
RDW: 15.7 % — ABNORMAL HIGH (ref 11.5–15.5)
WBC: 4.7 10*3/uL (ref 4.0–10.5)
nRBC: 0 % (ref 0.0–0.2)

## 2022-11-17 LAB — BASIC METABOLIC PANEL
Anion gap: 8 (ref 5–15)
BUN: 41 mg/dL — ABNORMAL HIGH (ref 8–23)
CO2: 21 mmol/L — ABNORMAL LOW (ref 22–32)
Calcium: 8.5 mg/dL — ABNORMAL LOW (ref 8.9–10.3)
Chloride: 104 mmol/L (ref 98–111)
Creatinine, Ser: 2.45 mg/dL — ABNORMAL HIGH (ref 0.44–1.00)
GFR, Estimated: 18 mL/min — ABNORMAL LOW (ref >60)
Glucose, Bld: 181 mg/dL — ABNORMAL HIGH (ref 70–99)
Potassium: 3.6 mmol/L (ref 3.5–5.1)
Sodium: 133 mmol/L — ABNORMAL LOW (ref 135–145)

## 2022-11-17 LAB — UREA NITROGEN, URINE: Urea Nitrogen, Ur: 305 mg/dL

## 2022-11-17 MED ORDER — HYDRALAZINE HCL 50 MG PO TABS
50.0000 mg | ORAL_TABLET | Freq: Three times a day (TID) | ORAL | Status: DC
  Administered 2022-11-17 – 2022-11-21 (×11): 50 mg via ORAL
  Filled 2022-11-17 (×11): qty 1

## 2022-11-17 MED ORDER — DOCUSATE SODIUM 100 MG PO CAPS: 100.0000 mg | ORAL_CAPSULE | Freq: Two times a day (BID) | ORAL | Status: DC | PRN

## 2022-11-17 MED ORDER — DOCUSATE SODIUM 100 MG PO CAPS
100.0000 mg | ORAL_CAPSULE | Freq: Two times a day (BID) | ORAL | Status: DC
  Administered 2022-11-17: 100 mg via ORAL
  Filled 2022-11-17: qty 1

## 2022-11-17 NOTE — Progress Notes (Signed)
Mobility Specialist Progress Note    11/17/22 1340  Mobility  Activity Ambulated with assistance in room  Level of Assistance Minimal assist, patient does 75% or more  Assistive Device Front wheel walker  Distance Ambulated (ft) 15 ft  Activity Response Tolerated well  Mobility Referral Yes  $Mobility charge 1 Mobility   Pre-Mobility: 69 HR  Pt received in bed. Pt stated she was not in the mood to get up but agreeable to get to the chair. C/o R shoulder pain. Left with RN present.   [place] Mobility Specialist  Please Neurosurgeon or Rehab Office at [phone]

## 2022-11-17 NOTE — Progress Notes (Signed)
[facility] Progress Note Patient: [name] ZOX:[id number] DOB: 1929-01-27 DOA: 11/15/2022  DOS: the patient was seen and examined on 11/17/2022  Brief hospital course:  OZZIE REMMERS is a 89 y.o. female with PMH significant of chronic diastolic CHF, CKD 4, emphysematous cystitis, uncontrolled hypertension, chronic pain, seizure disorder, gout. Patient was brought in by daughter secondary to complaints of poor p.o. intake as well as swelling in the leg. CXR with atelectasis vs infiltrates.  BNP >1900.  ECG old RBBB.  SpO2 88%. treated with IV diuresis. Assessment and Plan: Acute on chronic diastolic CHF Chest x-ray shows possible congestion versus atelectasis. Currently does not appear to have any infection. Was saturating 88% but currently 93% on room air. Treated with IV Lasix.  Now mild renal dysfunction therefore diuresis currently on hold. Per daughter her weight was 141 pounds 14 days ago and is currently 154 Pounds. Echocardiogram shows 6 to 65% EF with grade 1 diastolic dysfunction with RV overload and severe pulmonary hypertension.  Also has TR moderate.  Acute hypoxia. Per daughter patient does not use oxygen at baseline. Currently requiring 2 LPM. Most likely will require oxygen on discharge.   Chest tightness.  Resolved. EKG unremarkable.  Symptoms have been present for last few days.  Less likely cardiac in nature. Minimally elevated troponin not consistent with ACS. Continue Imdur.   Acute renal failure superimposed on stage 4 chronic kidney disease Baseline serum creatinine around 1.7. On admission serum creatinine 2.3. Ultrasound renal unremarkable for any hydronephrosis or obstruction. Urine studies indeterminant.  Possible prerenal versus intrinsic issues.  Urine urea pending. Initially serum creatinine improved but now worsening again.  Hold diuresis.   Epigastric tenderness. Etiology not clear.  X-ray abdomen unremarkable.  Lipase normal.  LFT  normal. Resolved on 4/19.  Type 2 diabetes mellitus with complication, without long-term current use of insulin Not on any medications at home.  Hemoglobin A1c 6.1. Will monitor for now.   Hypertensive urgency Blood pressure significantly elevated. Currently blood pressure significantly better with Norvasc 10 mg, Toprol at 100 mg, hydralazine 75 mg as well as Imdur.   GERD (gastroesophageal reflux disease) Continue PPI daily.   Dementia Mentation appears to be stable.  Does not have any behavioral issues. Appears to be on Seroquel at night which I will continue.   History of seizures. On Keppra. Which I will continue.   Chronic pain. On gabapentin 100 mg twice daily. Patient does have some obstruction at the time of examination likely from renal accumulation from AKI. Will reduce gabapentin dose to 100 nightly.   Recurrent UTI. On Macrobid started recently. Given her worsening renal function not a good candidate to continue this medication for now. Especially in the setting of her abdominal pain and poor p.o. intake.   History of femur fracture Severe pulmonary hypertension   Subjective: Reports severe headache.  No nausea no vomiting.  Ate well.  No constipation.  Physical Exam: Appears in moderate distress. S1-S2 present.  Aortic systolic murmur heard. Basal crackles still present. Bilateral lower extremity edema still present.  Unchanged from yesterday. Alert.  Able to follow commands.  No new focal deficit. No tenderness.  Bowel sound present.  Data Reviewed: I have Reviewed nursing notes, Vitals, and Lab results. Reviewed CBC and BMP.  Reordered CBC and BMP.  Disposition: Status is: Inpatient Remains inpatient appropriate because: Need improvement in renal function.  heparin injection 5,000 Units Start: 11/15/22 2200   Family Communication: Discussed with daughter at bedside. Level  of care: Telemetry Cardiac continue due to CHF Vitals:   11/17/22 0006  11/17/22 0418 11/17/22 0731 11/17/22 1234  BP: (!) 132/52 (!) 133/54 (!) 127/51 (!) 114/47  Pulse: 69 65 68 67  Resp: Temp: 97.6 F (36.4 C) 97.7 F (36.5 C)  98.4 F (36.9 C)  TempSrc: Oral Axillary  Oral  SpO2: 94% 96% 95% 98%  Weight:  69.9 kg    Height:         Author: Lynden Oxford, MD 11/17/2022 5:29 PM  Please look on [email or web address] to find out who is on call.

## 2022-11-18 DIAGNOSIS — I5033 Acute on chronic diastolic (congestive) heart failure: Secondary | ICD-10-CM | POA: Diagnosis not present

## 2022-11-18 LAB — CBC
HCT: 27.3 % — ABNORMAL LOW (ref 36.0–46.0)
Hemoglobin: 9.2 g/dL — ABNORMAL LOW (ref 12.0–15.0)
MCH: 32.2 pg (ref 26.0–34.0)
MCHC: 33.7 g/dL (ref 30.0–36.0)
MCV: 95.5 fL (ref 80.0–100.0)
Platelets: 197 10*3/uL (ref 150–400)
RBC: 2.86 MIL/uL — ABNORMAL LOW (ref 3.87–5.11)
RDW: 15.7 % — ABNORMAL HIGH (ref 11.5–15.5)
WBC: 3.7 10*3/uL — ABNORMAL LOW (ref 4.0–10.5)
nRBC: 0 % (ref 0.0–0.2)

## 2022-11-18 LAB — BASIC METABOLIC PANEL
Anion gap: 8 (ref 5–15)
BUN: 43 mg/dL — ABNORMAL HIGH (ref 8–23)
CO2: 22 mmol/L (ref 22–32)
Calcium: 8.6 mg/dL — ABNORMAL LOW (ref 8.9–10.3)
Chloride: 105 mmol/L (ref 98–111)
Creatinine, Ser: 2.75 mg/dL — ABNORMAL HIGH (ref 0.44–1.00)
GFR, Estimated: 16 mL/min — ABNORMAL LOW (ref >60)
Glucose, Bld: 104 mg/dL — ABNORMAL HIGH (ref 70–99)
Potassium: 3.6 mmol/L (ref 3.5–5.1)
Sodium: 135 mmol/L (ref 135–145)

## 2022-11-18 LAB — MAGNESIUM: Magnesium: 2 mg/dL (ref 1.7–2.4)

## 2022-11-18 MED ORDER — QUETIAPINE FUMARATE 25 MG PO TABS: 12.5000 mg | ORAL_TABLET | Freq: Every day | ORAL | Status: DC

## 2022-11-18 MED ORDER — ALBUMIN HUMAN 5 % IV SOLN
12.5000 g | Freq: Once | INTRAVENOUS | Status: AC
  Administered 2022-11-18: 12.5 g via INTRAVENOUS
  Filled 2022-11-18: qty 250

## 2022-11-18 MED ORDER — QUETIAPINE FUMARATE 25 MG PO TABS
12.5000 mg | ORAL_TABLET | Freq: Every day | ORAL | Status: DC
  Administered 2022-11-19 – 2022-11-24 (×6): 12.5 mg via ORAL
  Filled 2022-11-18 (×6): qty 1

## 2022-11-18 MED ORDER — ACETAMINOPHEN 500 MG PO TABS
500.0000 mg | ORAL_TABLET | Freq: Two times a day (BID) | ORAL | Status: DC
  Administered 2022-11-18 – 2022-11-25 (×16): 500 mg via ORAL
  Filled 2022-11-18 (×16): qty 1

## 2022-11-18 MED ORDER — ALBUMIN HUMAN 25 % IV SOLN
INTRAVENOUS | Status: AC
  Filled 2022-11-18: qty 50

## 2022-11-18 NOTE — Progress Notes (Signed)
[facility] Progress Note Patient: [name] ZOX:[id number] DOB: March 25, 1929 DOA: 11/15/2022  DOS: the patient was seen and examined on 11/18/2022  Brief hospital course:  [name] is a 89 y.o. female with PMH significant of chronic diastolic CHF, CKD 4, emphysematous cystitis, uncontrolled hypertension, chronic pain, seizure disorder, gout. Patient was brought in by daughter secondary to complaints of poor p.o. intake as well as swelling in the leg. CXR with atelectasis vs infiltrates.  BNP >1900.  ECG old RBBB.  SpO2 88%. treated with IV diuresis. Developed AKI with serum creatinine trending from 1-2.3 at the time of admission to 2.75 right now.  Receiving IV albumin. Assessment and Plan: Acute on chronic diastolic CHF Chest x-ray shows possible congestion versus atelectasis. Currently does not appear to have any infection. Was saturating 88% but currently 93% on room air. Treated with IV Lasix.  Now mild renal dysfunction therefore diuresis currently on hold. Per daughter her weight was 141 pounds 14 days ago and is currently 154 Pounds. Echocardiogram shows 60 to 65% EF with grade 1 diastolic dysfunction with RV overload and severe pulmonary hypertension.  Also has TR moderate.   Acute hypoxia. Per daughter patient does not use oxygen at baseline. On 4/21 9394% on room air at rest. Most likely will require oxygen on discharge.   Chest tightness.  Resolved. EKG unremarkable.  Symptoms have been present for last few days.  Less likely cardiac in nature. Minimally elevated troponin not consistent with ACS. Continue Imdur.   Acute renal failure superimposed on stage 4 chronic kidney disease Baseline serum creatinine around 1.7. On admission serum creatinine 2.3. Ultrasound renal unremarkable for any hydronephrosis or obstruction. Urine studies indeterminant.  Possible prerenal versus intrinsic issues.  Urine urea pending. Initially serum creatinine improved but now worsening  again.  Treated with IV albumin.   Epigastric tenderness. Etiology not clear.  X-ray abdomen unremarkable.  Lipase normal.  LFT normal. Resolved on 4/19.   Type 2 diabetes mellitus with complication, without long-term current use of insulin Not on any medications at home.  Hemoglobin A1c 6.1. Will monitor for now.   Hypertensive urgency Blood pressure significantly elevated. Currently blood pressure significantly better with Norvasc 10 mg, Toprol at 100 mg, hydralazine 75 mg as well as Imdur.   GERD (gastroesophageal reflux disease) Continue PPI daily.   Dementia Mentation appears to be stable.  Does not have any behavioral issues. Appears to be on Seroquel at night which I will continue.   History of seizures. On Keppra. Which I will continue.   Chronic pain. On gabapentin 100 mg twice daily. Patient does have some obstruction at the time of examination likely from renal accumulation from AKI. Will reduce gabapentin dose to 100 nightly.   Recurrent UTI. On Macrobid started recently. Given her worsening renal function not a good candidate to continue this medication for now. Especially in the setting of her abdominal pain and poor p.o. intake.   Severe pulmonary hypertension Patient has not seen a cardiologist no regular basis. Present plan for this condition is pretty difficult. At her age prognosis is very poor. Patient is already in the process of establishing with palliative care outpatient.  History of femur fracture   Subjective: Patient is sleepy.  Denies any acute complaint.  No nausea or vomiting.  Physical Exam: General: in Mild distress, No Rash Cardiovascular: S1 and S2 Present, No Murmur Respiratory: Good respiratory effort, Bilateral Air entry present. No Crackles, No wheezes Abdomen: Bowel Sound present, No  tenderness Extremities: Bilateral improving edema Neuro: Alert and oriented x3, no new focal deficit  Data Reviewed: I have Reviewed nursing  notes, Vitals, and Lab results. Since last encounter, pertinent lab results CBC and BMP   . I have ordered test including BMP and magnesium  .   Disposition: Status is: Inpatient Remains inpatient appropriate because: Monitor for improvement in serum creatinine  heparin injection 5,000 Units Start: 11/15/22 2200   Family Communication: Daughter at bedside Level of care: Telemetry Cardiac   Vitals:   11/18/22 0449 11/18/22 0742 11/18/22 1159 11/18/22 1523  BP: (!) 122/51 129/61 (!) 139/55 (!) 141/60  Pulse: 67 67 60 61  Resp: Temp: (!) 97.3 F (36.3 C)   (!) 97.3 F (36.3 C)  TempSrc: Axillary   Axillary  SpO2: 97% 97% 100% 92%  Weight: 70.4 kg     Height:         Author: Lynden Oxford, MD 11/18/2022 5:32 PM  Please look on [email or web address] to find out who is on call.

## 2022-11-19 DIAGNOSIS — I5033 Acute on chronic diastolic (congestive) heart failure: Secondary | ICD-10-CM | POA: Diagnosis not present

## 2022-11-19 LAB — BASIC METABOLIC PANEL
Anion gap: 8 (ref 5–15)
BUN: 45 mg/dL — ABNORMAL HIGH (ref 8–23)
CO2: 22 mmol/L (ref 22–32)
Calcium: 8.8 mg/dL — ABNORMAL LOW (ref 8.9–10.3)
Chloride: 103 mmol/L (ref 98–111)
Creatinine, Ser: 2.84 mg/dL — ABNORMAL HIGH (ref 0.44–1.00)
GFR, Estimated: 15 mL/min — ABNORMAL LOW (ref >60)
Glucose, Bld: 120 mg/dL — ABNORMAL HIGH (ref 70–99)
Potassium: 3.9 mmol/L (ref 3.5–5.1)
Sodium: 133 mmol/L — ABNORMAL LOW (ref 135–145)

## 2022-11-19 LAB — CBC
HCT: 29.1 % — ABNORMAL LOW (ref 36.0–46.0)
Hemoglobin: 9.2 g/dL — ABNORMAL LOW (ref 12.0–15.0)
MCH: 31 pg (ref 26.0–34.0)
MCHC: 31.6 g/dL (ref 30.0–36.0)
MCV: 98 fL (ref 80.0–100.0)
Platelets: 198 10*3/uL (ref 150–400)
RBC: 2.97 MIL/uL — ABNORMAL LOW (ref 3.87–5.11)
RDW: 15.6 % — ABNORMAL HIGH (ref 11.5–15.5)
WBC: 3.9 10*3/uL — ABNORMAL LOW (ref 4.0–10.5)
nRBC: 0 % (ref 0.0–0.2)

## 2022-11-19 LAB — MAGNESIUM: Magnesium: 2.1 mg/dL (ref 1.7–2.4)

## 2022-11-19 LAB — NA AND K (SODIUM & POTASSIUM), RAND UR
Potassium Urine: 36 mmol/L
Sodium, Ur: 16 mmol/L

## 2022-11-19 LAB — CREATININE, URINE, RANDOM: Creatinine, Urine: 142 mg/dL

## 2022-11-19 LAB — LEVETIRACETAM LEVEL: Levetiracetam Lvl: 55.9 ug/mL — ABNORMAL HIGH (ref 10.0–40.0)

## 2022-11-19 MED ORDER — LACTATED RINGERS IV SOLN: INTRAVENOUS | Status: DC

## 2022-11-19 MED ORDER — DRONABINOL 2.5 MG PO CAPS
2.5000 mg | ORAL_CAPSULE | Freq: Two times a day (BID) | ORAL | Status: DC
  Administered 2022-11-19 – 2022-11-25 (×12): 2.5 mg via ORAL
  Filled 2022-11-19 (×12): qty 1

## 2022-11-19 NOTE — Progress Notes (Addendum)
Physical Therapy Treatment Patient Details Name: [name] MRN: [id number] DOB: 12-05-1928 Today's Date: 11/19/2022   History of Present Illness Pt is a 89 y.o. female admitted 4/18 with acute on chronic diastolic heart failure. PMH: dementia, DM, GERD, glaucoma, CHF, HTN, h/o fall with distal femur fx s/o ORIF (Dec 2023)    PT Comments    Pt making slow, steady progress with mobility. Will continue to work toward incr mobility and independence for return home.    Recommendations for follow up therapy are one component of a multi-disciplinary discharge planning process, led by the attending physician.  Recommendations may be updated based on patient status, additional functional criteria and insurance authorization.  Follow Up Recommendations       Assistance Recommended at Discharge Frequent or constant Supervision/Assistance  Patient can return home with the following A little help with walking and/or transfers;A lot of help with bathing/dressing/bathroom;Assistance with cooking/housework;Assist for transportation;Help with stairs or ramp for entrance   Equipment Recommendations  None recommended by PT    Recommendations for Other Services       Precautions / Restrictions Precautions Precautions: Fall;Other (comment) Precaution Comments: watch sats     Mobility  Bed Mobility Overal bed mobility: Needs Assistance Bed Mobility: Sit to Supine       Sit to supine: Mod assist   General bed mobility comments: Assist to lower trunk and bring legs back up into bed    Transfers Overall transfer level: Needs assistance Equipment used: Rolling walker (2 wheels) Transfers: Sit to/from Stand, Bed to chair/wheelchair/BSC Sit to Stand: Min assist   Step pivot transfers: Min assist       General transfer comment: Assist to power up and stabilize. Chair to bed with min assist using walker and assist for balance and verbal cues for technique.     Ambulation/Gait Ambulation/Gait assistance: Min assist Gait Distance (Feet): 65 Feet Assistive device: Rolling walker (2 wheels) Gait Pattern/deviations: Step-through pattern, Decreased step length - right, Decreased step length - left, Trendelenburg, Trunk flexed Gait velocity: decr Gait velocity interpretation: <1.31 ft/sec, indicative of household ambulator   General Gait Details: Assist for balance and verbal cues to stand more erect   Stairs             Wheelchair Mobility    Modified Rankin (Stroke Patients Only)       Balance Overall balance assessment: Needs assistance Sitting-balance support: No upper extremity supported, Feet supported Sitting balance-Leahy Scale: Fair     Standing balance support: Single extremity supported, Bilateral upper extremity supported, No upper extremity supported Standing balance-Leahy Scale: Poor Standing balance comment: Min assist if no UE support. Min guard with UE support on walker                            Cognition Arousal/Alertness: Awake/alert (sleepy but awakens to stimulus) Behavior During Therapy: WFL for tasks assessed/performed Overall Cognitive Status: History of cognitive impairments - at baseline                                 General Comments: dementia at baseline        Exercises Other Exercises Other Exercises: Standing and reaching with unilateral support on walker    General Comments General comments (skin integrity, edema, etc.): VSS on 2L      Pertinent Vitals/Pain Pain Assessment Pain Assessment: Faces Faces Pain Scale:  No hurt    Home Living                   Home Equipment: Rollator (4 wheels);Cane - single point;Shower seat;BSC/3in1;Transport Restaurant manager, fast food (2 wheels)      Prior Function            PT Goals (current goals can now be found in the care plan section) Progress towards PT goals: Progressing toward goals    Frequency     Min 1X/week      PT Plan Current plan remains appropriate    Co-evaluation              AM-PAC PT "6 Clicks" Mobility   Outcome Measure  Help needed turning from your back to your side while in a flat bed without using bedrails?: A Little Help needed moving from lying on your back to sitting on the side of a flat bed without using bedrails?: A Lot Help needed moving to and from a bed to a chair (including a wheelchair)?: A Little Help needed standing up from a chair using your arms (e.g., wheelchair or bedside chair)?: A Little Help needed to walk in hospital room?: A Little Help needed climbing 3-5 steps with a railing? : A Lot 6 Click Score: 16    End of Session Equipment Utilized During Treatment: Gait belt;Oxygen Activity Tolerance: Patient tolerated treatment well Patient left: in bed;with call bell/phone within reach;with bed alarm set;with family/visitor present   PT Visit Diagnosis: Muscle weakness (generalized) (M62.81)     Time: 1610-9604 PT Time Calculation (min) (ACUTE ONLY): 30 min  Charges:  $Gait Training: 23-37 mins                     [facility] PT Acute Rehabilitation Services Office [phone]    [name] [facility] 11/19/2022, 3:39 PM

## 2022-11-19 NOTE — Care Management Important Message (Signed)
Important Message  Patient Details  Name: [name] MRN: [id number] Date of Birth: 04/20/1929   Medicare Important Message Given:  Yes     Renie Ora 11/19/2022, 11:11 AM

## 2022-11-19 NOTE — Progress Notes (Signed)
[facility] Progress Note Patient: [name] RUE:[id number] DOB: 11-14-1928 DOA: 11/15/2022  DOS: the patient was seen and examined on 11/19/2022  Brief hospital course:  NAKEITA Clements is a 89 y.o. female with PMH significant of chronic diastolic CHF, CKD 4, emphysematous cystitis, uncontrolled hypertension, chronic pain, seizure disorder, gout. Patient was brought in by daughter secondary to complaints of poor p.o. intake as well as swelling in the leg. CXR with atelectasis vs infiltrates.  BNP >1900.  ECG old RBBB.  SpO2 88%. treated with IV diuresis. Developed AKI with serum creatinine trending from 1-2.3 at the time of admission to 2.75 right now.  Receiving IV albumin. Assessment and Plan: Acute on chronic diastolic CHF Chest x-ray shows possible congestion versus atelectasis. Currently does not appear to have any infection. Was saturating 88% but currently 93% on room air. Treated with IV Lasix.  Now mild renal dysfunction therefore diuresis currently on hold. Per daughter her weight was 141 pounds 14 days ago and is currently 154 Pounds. Echocardiogram shows 60 to 65% EF with grade 1 diastolic dysfunction with RV overload and severe pulmonary hypertension.  Also has TR moderate.   Acute hypoxia. Per daughter patient does not use oxygen at baseline. On 4/21 9394% on room air at rest. Most likely will require oxygen on discharge.   Chest tightness.  Resolved. EKG unremarkable.  Symptoms have been present for last few days.  Less likely cardiac in nature. Minimally elevated troponin not consistent with ACS. Continue Imdur.   Acute renal failure superimposed on stage 4 chronic kidney disease Baseline serum creatinine around 1.7. On admission serum creatinine 2.3. Ultrasound renal unremarkable for any hydronephrosis or obstruction. Urine studies indeterminant.  Possible prerenal versus intrinsic issues.  Urine urea pending. Initially serum creatinine improved but now worsening  again.  Treated with IV albumin.  Now back on IV fluid.  Will monitor response.   Epigastric tenderness. Etiology not clear.  X-ray abdomen unremarkable.  Lipase normal.  LFT normal. Resolved on 4/19.   Type 2 diabetes mellitus with complication, without long-term current use of insulin Not on any medications at home.  Hemoglobin A1c 6.1. Will monitor for now.   Hypertensive urgency Blood pressure significantly elevated. Currently blood pressure significantly better with Norvasc 10 mg, Toprol at 100 mg, hydralazine 75 mg as well as Imdur.   GERD (gastroesophageal reflux disease) Continue PPI daily.   Dementia Mentation appears to be stable.  Does not have any behavioral issues. Appears to be on Seroquel at night which I will continue.   History of seizures. On Keppra. Which I will continue.   Chronic pain. On gabapentin 100 mg twice daily. Patient does have some obstruction at the time of examination likely from renal accumulation from AKI. Will reduce gabapentin dose to 100 nightly.   Recurrent UTI. On Macrobid started recently. Given her worsening renal function not a good candidate to continue this medication for now. Especially in the setting of her abdominal pain and poor p.o. intake.   Severe pulmonary hypertension Patient has not seen a cardiologist no regular basis. Present plan for this condition is pretty difficult. At her age prognosis is very poor. Patient is already in the process of establishing with palliative care outpatient.  Goals of care conversation. Palliative care consulted. I recommendation with the daughter earlier.  Currently remaining full code.  History of femur fracture   Subjective: No nausea no vomiting.  No fever no chills.  In good spirit today as he has  multiple visitation.  Still minimal oral intake.  Physical Exam: S1 and S2 present. Bilateral lower extremity edema unchanged. Bilateral basal crackles. Bowel sound present. No  tenderness. Alert and awake and oriented to place and person.  Data Reviewed: I have Reviewed nursing notes, Vitals, and Lab results. Reviewed CBC and BMP.  Reordered CBC and BMP.  Disposition: Status is: Inpatient Remains inpatient appropriate because: Monitor for improvement in serum creatinine  heparin injection 5,000 Units Start: 11/15/22 2200   Family Communication: Daughter at on the phone Level of care: Telemetry Cardiac   Vitals:   11/19/22 0427 11/19/22 0723 11/19/22 1057 11/19/22 1553  BP: 114/70 (!) 129/51 (!) 122/53 (!) 122/56  Pulse: 61 62 64 (!) 54  Resp: Temp: (!) 97.4 F (36.3 C) 97.9 F (36.6 C) 97.8 F (36.6 C) 97.9 F (36.6 C)  TempSrc: Oral     SpO2: 99%     Weight: 70.8 kg     Height:         Author: Lynden Oxford, MD 11/19/2022 7:42 PM  Please look on [email or web address] to find out who is on call.

## 2022-11-20 ENCOUNTER — Ambulatory Visit: Payer: PPO | Admitting: Physical Therapy

## 2022-11-20 DIAGNOSIS — I5033 Acute on chronic diastolic (congestive) heart failure: Secondary | ICD-10-CM | POA: Diagnosis not present

## 2022-11-20 DIAGNOSIS — Z515 Encounter for palliative care: Secondary | ICD-10-CM | POA: Diagnosis not present

## 2022-11-20 DIAGNOSIS — Z7189 Other specified counseling: Secondary | ICD-10-CM | POA: Diagnosis not present

## 2022-11-20 DIAGNOSIS — Z8781 Personal history of (healed) traumatic fracture: Secondary | ICD-10-CM

## 2022-11-20 DIAGNOSIS — Z66 Do not resuscitate: Secondary | ICD-10-CM | POA: Diagnosis not present

## 2022-11-20 LAB — RENAL FUNCTION PANEL
Albumin: 2.9 g/dL — ABNORMAL LOW (ref 3.5–5.0)
Anion gap: 10 (ref 5–15)
BUN: 48 mg/dL — ABNORMAL HIGH (ref 8–23)
CO2: 21 mmol/L — ABNORMAL LOW (ref 22–32)
Calcium: 9 mg/dL (ref 8.9–10.3)
Chloride: 99 mmol/L (ref 98–111)
Creatinine, Ser: 3.03 mg/dL — ABNORMAL HIGH (ref 0.44–1.00)
GFR, Estimated: 14 mL/min — ABNORMAL LOW (ref >60)
Glucose, Bld: 99 mg/dL (ref 70–99)
Phosphorus: 4.8 mg/dL — ABNORMAL HIGH (ref 2.5–4.6)
Potassium: 4 mmol/L (ref 3.5–5.1)
Sodium: 130 mmol/L — ABNORMAL LOW (ref 135–145)

## 2022-11-20 LAB — UREA NITROGEN, URINE: Urea Nitrogen, Ur: 350 mg/dL

## 2022-11-20 LAB — MAGNESIUM: Magnesium: 1.9 mg/dL (ref 1.7–2.4)

## 2022-11-20 MED ORDER — SODIUM CHLORIDE 0.9 % IV SOLN: INTRAVENOUS | Status: DC

## 2022-11-20 NOTE — Progress Notes (Signed)
   Brief Palliative Medicine Progress Note:  PMT consult received and chart reviewed. Goals of care completed, full note to follow.  Recommendations: Change to DNR Continue current scope of care otherwise ("Limited interventions"; no intubation) - see DNR order Time for outcomes PMT will continue to follow   Thank you for allowing [place] to participate in the care of [name]  Signed by: Wynne Dust, NP Palliative Medicine Team NO CHARGE  Team Phone # [phone] (Nights/Weekends)  11/20/2022, 4:57 PM

## 2022-11-20 NOTE — Progress Notes (Signed)
[facility] Progress Note Patient: [name] MVH:[id number] DOB: 18-Jul-1929 DOA: 11/15/2022  DOS: the patient was seen and examined on 11/20/2022  Brief hospital course:  [name] is a 89 y.o. female with PMH significant of chronic diastolic CHF, CKD 4, emphysematous cystitis, uncontrolled hypertension, chronic pain, seizure disorder, gout. Patient was brought in by daughter secondary to complaints of poor p.o. intake as well as swelling in the leg. CXR with atelectasis vs infiltrates.  BNP >1900.  ECG old RBBB.  SpO2 88%. treated with IV diuresis. Developed AKI with serum creatinine trending from 1-2.3 at the time of admission to 2.75 right now.  Receiving IV albumin. Assessment and Plan: Acute on chronic diastolic CHF Chest x-ray shows possible congestion versus atelectasis. Currently does not appear to have any infection. Was saturating 88% but currently 93% on room air. Treated with IV Lasix.  Now mild renal dysfunction therefore diuresis currently on hold. Per daughter her weight was 141 pounds 14 days ago and on admission 154 pounds.  Currently actually trending up to 161 pound. Echocardiogram shows 60 to 65% EF with grade 1 diastolic dysfunction with RV overload and severe pulmonary hypertension.  Also has TR moderate.   Acute hypoxia. Per daughter patient does not use oxygen at baseline. Currently requiring 2 L.  Will require oxygen at discharge.   Chest tightness.  Resolved. EKG unremarkable.  Symptoms have been present for last few days.  Less likely cardiac in nature. Minimally elevated troponin not consistent with ACS. Continue Imdur.   Acute renal failure superimposed on stage 4 chronic kidney disease Baseline serum creatinine around 1.7. On admission serum creatinine 2.3. Ultrasound renal unremarkable for any hydronephrosis or obstruction. FENa consistent with prerenal etiology. Initially serum creatinine improved with diuresis but then worsened Treated with IV  albumin.  Now back on IV fluid.  Will monitor response.   Epigastric tenderness. Resolved.  X-ray abdomen unremarkable.  Lipase normal.  LFT normal.   Type 2 diabetes mellitus with complication, without long-term current use of insulin Not on any medications at home.  Hemoglobin A1c 6.1. Will monitor for now.   Hypertensive urgency Blood pressure significantly elevated. Currently blood pressure significantly better with Norvasc 10 mg, Toprol at 100 mg, hydralazine 75 mg as well as Imdur.   GERD (gastroesophageal reflux disease) Continue PPI daily.   Dementia Mentation appears to be stable.  Does not have any behavioral issues. Appears to be on Seroquel at night which I will continue. Daughter holds the medicine once the patient is more sleepy and only uses it for to help sleep the patient. We will reduce the Seroquel to 12.5 mg and taper it off over next 3 to 4 days. Continue Remeron. Marinol added to stimulate the appetite.   History of seizures. On Keppra. Which I will continue.   Chronic pain. On gabapentin 100 mg twice daily. Patient does have some obstruction at the time of examination likely from renal accumulation from AKI. Will reduce gabapentin dose to 100 nightly.   Recurrent UTI. On Macrobid started recently. Given her worsening renal function not a good candidate to continue this medication for now. Especially in the setting of her abdominal pain and poor p.o. intake.   Severe pulmonary hypertension Patient has not seen a cardiologist no regular basis. Present plan for this condition is pretty difficult. At her age prognosis is very poor.   Goals of care conversation. Palliative care consulted. Appreciate assistance.  Patient currently DNR. Discussed with daughter if the  renal function improves and the shortness of breath does not worsens then patient will be stable to be discharged home soon.  But if any of this parameters are not improving goal should be to  focus on comfort.   History of femur fracture   Subjective: No nausea no vomiting.  No shortness of breath.  No new complaints.  Minimal oral intake.  Later in the afternoon had good lunch.  Physical Exam: General: in Mild distress, No Rash Cardiovascular: S1 and S2 Present, No Murmur Respiratory: Good respiratory effort, Bilateral Air entry present. No Crackles, No wheezes Abdomen: Bowel Sound present, No tenderness Extremities: No edema Neuro: Alert and oriented x3, no new focal deficit  Data Reviewed: I have Reviewed nursing notes, Vitals, and Lab results. Since last encounter, pertinent lab results CBC and BMP   . I have ordered test including CBC and BMP  . I have discussed pt's care plan and test results with palliative care  .   Disposition: Status is: Inpatient Remains inpatient appropriate because: Awaiting improvement in serum creatinine  heparin injection 5,000 Units Start: 11/15/22 2200   Family Communication: Discussed with daughter at bedside Level of care: Telemetry Cardiac   Vitals:   11/20/22 0722 11/20/22 1108 11/20/22 1259 11/20/22 1300  BP: (!) 145/53 (!) 137/54 (!) 117/47 (!) 119/50  Pulse: (!) 46 (!) 57    Resp: 17 17    Temp: 97.9 F (36.6 C) 97.9 F (36.6 C)    TempSrc:      SpO2: 98% 100% (!) 88% 96%  Weight:      Height:         Author: Lynden Oxford, MD 11/20/2022 6:10 PM  Please look on [email or web address] to find out who is on call.

## 2022-11-20 NOTE — Progress Notes (Signed)
Mobility Specialist Progress Note:   11/20/22 0940  Mobility  Activity Ambulated with assistance in room;Transferred from bed to chair  Level of Assistance Minimal assist, patient does 75% or more  Assistive Device Front wheel walker  Distance Ambulated (ft) 10 ft  Activity Response Tolerated fair  Mobility Referral Yes  $Mobility charge 1 Mobility   Pt agreeable to mobility session with encouragement. Required minA to stand, and minG during. Verbal cues required for posture. Pt left sitting in chair with all needs met.  Addison Lank Mobility Specialist Please contact via SecureChat or  Rehab office at [phone]

## 2022-11-20 NOTE — Progress Notes (Signed)
SATURATION QUALIFICATIONS: (This note is used to comply with regulatory documentation for home oxygen)  Patient Saturations on Room Air at Rest = 88%  Patient Saturations on Room Air while Ambulating = 96%  Patient Saturations on 2 Liters of oxygen while at rest = 94%  Please briefly explain why patient needs home oxygen: Pt hypoxic on room air at rest in recliner.

## 2022-11-20 NOTE — Consult Note (Signed)
Palliative Care Consult Note                                  Date: 11/20/2022   Patient Name: [name]  DOB: 1929/03/31  MRN: [id number]  Age / Sex: 89 y.o., female  PCP: Renford Dills, MD Referring Physician: Rolly Salter, MD  Reason for Consultation: {Reason for Consult:23484}  HPI/Patient Profile: 89 y.o. female  with past medical history of *** admitted on 11/15/2022 with ***.   Past Medical History:  Diagnosis Date   Dementia    Diabetes mellitus    GERD (gastroesophageal reflux disease)    Glaucoma    Heart disease    Hypertension    Tinnitus     Subjective:   This NP Wynne Dust reviewed medical records, received report from team, assessed the patient and then meet at the patient's bedside to discuss diagnosis, prognosis, GOC, EOL wishes disposition and options.  I met with ***.   Concept of Palliative Care was introduced as specialized medical care for people and their families living with serious illness.  If focuses on providing relief from the symptoms and stress of a serious illness.  The goal is to improve quality of life for both the patient and the family. Values and goals of care important to patient and family were attempted to be elicited.  Created space and opportunity for patient  and family to explore thoughts and feelings regarding current medical situation   Natural trajectory and current clinical status were discussed. Questions and concerns addressed. Patient  encouraged to call with questions or concerns.    Patient/Family Understanding of Illness: ***  Life Review: ***  Patient Values: ***  Goals: ***  Today's Discussion: ***  Review of Systems  Objective:   Primary Diagnoses: Present on Admission:  Acute on chronic diastolic CHF (congestive heart failure)  Acute renal failure superimposed on stage 4 chronic kidney disease  Type 2 diabetes mellitus with complication, without  long-term current use of insulin  Hypertension  GERD (gastroesophageal reflux disease)  Dementia  Severe pulmonary hypertension  Acute on chronic diastolic (congestive) heart failure   Physical Exam  Vital Signs:  BP (!) 119/50 (BP Location: Right Arm)   Pulse (!) 57   Temp 97.9 F (36.6 C)   Resp 17   Ht  (1.575 m)   Wt 73.3 kg   SpO2 96%   BMI 29.56 kg/m   Palliative Assessment/Data: ***    Advanced Care Planning:   Existing Vynca/ACP Documentation: ***  Primary Decision Maker: {Primary Decision WUJWJ:19147}  Code Status/Advance Care Planning: {Palliative Code status:23503}  A discussion was had today regarding advanced directives. Concepts specific to code status, artifical feeding and hydration, continued IV antibiotics and rehospitalization was had.  The difference between a aggressive medical intervention path and a palliative comfort care path for this patient at this time was had. ***The MOST form was introduced and discussed.***  Decisions/Changes to ACP: ***  Assessment & Plan:   Impression: ***  SUMMARY OF RECOMMENDATIONS   ***  Symptom Management:  ***  Prognosis:  {Palliative Care Prognosis:23504}  Discharge Planning:  {Palliative dispostion:23505}   Discussed with: ***    Thank you for allowing [place] to participate in the care of NAKKIA MACKIEWICZ PMT will continue to support holistically.  Time Total: ***  Greater than 50%  of this time was spent counseling and coordinating  care related to the above assessment and plan.  Signed by: Wynne Dust, NP Palliative Medicine Team  Team Phone # [phone] (Nights/Weekends)  11/20/2022, 5:00 PM

## 2022-11-20 NOTE — Progress Notes (Signed)
Physical Therapy Treatment Patient Details Name: [name] MRN: [id number] DOB: May 29, 1929 Today's Date: 11/20/2022   History of Present Illness Pt is a 89 y.o. female admitted 4/18 with acute on chronic diastolic heart failure. PMH: dementia, DM, GERD, glaucoma, CHF, HTN, h/o fall with distal femur fx s/o ORIF (Dec 2023).    PT Comments    Pt received in recliner, agreeable to therapy session with encouragement, pt's PCA present and also encouraging her. Pt needing up to minA with multimodal cues to perform gait with RW and min guard for transfers. Pt with slow processing and tangential, and with decreased safe use of assistive device as she fatigues. Pt continues to benefit from PT services to progress toward functional mobility goals.    Recommendations for follow up therapy are one component of a multi-disciplinary discharge planning process, led by the attending physician.  Recommendations may be updated based on patient status, additional functional criteria and insurance authorization.  Follow Up Recommendations       Assistance Recommended at Discharge Frequent or constant Supervision/Assistance  Patient can return home with the following A little help with walking and/or transfers;A lot of help with bathing/dressing/bathroom;Assistance with cooking/housework;Assist for transportation;Help with stairs or ramp for entrance   Equipment Recommendations  None recommended by PT    Recommendations for Other Services       Precautions / Restrictions Precautions Precautions: Fall;Other (comment) Precaution Comments: watch sats (tends to desat when resting) Restrictions Weight Bearing Restrictions: No     Mobility  Bed Mobility Overal bed mobility: Needs Assistance             General bed mobility comments: pt received in recliner and wanting to remain in chair at end of session    Transfers Overall transfer level: Needs assistance Equipment used: Rolling walker (2  wheels) Transfers: Sit to/from Stand Sit to Stand: Min assist           General transfer comment: Assist to power up and stabilize, cues for safe UE placement needed with stand>sit as pt attempting to sit prior reaching proximity to chair. Pt needs hand over hand assist to reach back for arm rests.    Ambulation/Gait Ambulation/Gait assistance: Min assist Gait Distance (Feet): 45 Feet Assistive device: Rolling walker (2 wheels) Gait Pattern/deviations: Step-through pattern, Decreased step length - right, Decreased step length - left, Trendelenburg, Trunk flexed Gait velocity: decr     General Gait Details: Assist for balance and verbal cues to stand more erect, pt with increased downward gaze and posterior bias with fatigue, tending to stand too far outside RW. Nearly modA for backward stepping/turning due to c/o fatigue. Per RN, pt has ambulated to bathroom multiple times today prior to session and may be related to increased fatigue.   Stairs             Wheelchair Mobility    Modified Rankin (Stroke Patients Only)       Balance Overall balance assessment: Needs assistance Sitting-balance support: No upper extremity supported, Feet supported Sitting balance-Leahy Scale: Fair     Standing balance support: Bilateral upper extremity supported, During functional activity Standing balance-Leahy Scale: Poor Standing balance comment: Min assist for some dynamic standing tasks with RW (backward stepping/turning)                            Cognition Arousal/Alertness: Awake/alert (sleepy but awakens to stimulus) Behavior During Therapy: WFL for tasks assessed/performed Overall Cognitive Status:  History of cognitive impairments - at baseline                                 General Comments: dementia at baseline, pt following simple commands with increased time ~75% of the time. Pt needs multimodal cues for some commands.        Exercises       General Comments General comments (skin integrity, edema, etc.): BP 117/47 (66) seated HR 52 bpm prior to standing; BP 119/50 (72) seated in chair post-exertion. No dizziness reported during gait trial. SpO2 WFL 97% on RA with gait trial, however once pt falling asleep in recliner chair, SpO2 dropped to 88-91% which is below O2 goal of 94% and greater, so pt placed back on 2L O2 [place] and sats WFL.      Pertinent Vitals/Pain Pain Assessment Pain Assessment: No/denies pain Faces Pain Scale: No hurt Pain Intervention(s): Monitored during session, Repositioned     PT Goals (current goals can now be found in the care plan section) Acute Rehab PT Goals Patient Stated Goal: home with assist from daughter and PCA PT Goal Formulation: With patient Time For Goal Achievement: 11/30/22 Progress towards PT goals: Progressing toward goals    Frequency    Min 1X/week      PT Plan Current plan remains appropriate       AM-PAC PT "6 Clicks" Mobility   Outcome Measure  Help needed turning from your back to your side while in a flat bed without using bedrails?: A Little Help needed moving from lying on your back to sitting on the side of a flat bed without using bedrails?: A Lot Help needed moving to and from a bed to a chair (including a wheelchair)?: A Little Help needed standing up from a chair using your arms (e.g., wheelchair or bedside chair)?: A Little Help needed to walk in hospital room?: A Little Help needed climbing 3-5 steps with a railing? : A Lot 6 Click Score: 16    End of Session Equipment Utilized During Treatment: Gait belt;Oxygen;Other (comment) (no O2 with gait as she was [facility], however needed O2 when resting due to desat when sleeping.) Activity Tolerance: Patient tolerated treatment well Patient left: with call bell/phone within reach;with family/visitor present;in chair;Other (comment) (PCA present, per PCA when she leaves pt's daughter will be present, so she does  not need chair alarm)   PT Visit Diagnosis: Muscle weakness (generalized) (M62.81)     Time: 1610-9604 PT Time Calculation (min) (ACUTE ONLY): 24 min  Charges:  $Gait Training: 8-22 mins $Therapeutic Activity: 8-22 mins                     Laura Nobel P., PTA Acute Rehabilitation Services Secure Chat Preferred 9a-5:30pm Office: [phone]    [name] 11/20/2022, 1:57 PM

## 2022-11-21 DIAGNOSIS — I5033 Acute on chronic diastolic (congestive) heart failure: Secondary | ICD-10-CM | POA: Diagnosis not present

## 2022-11-21 DIAGNOSIS — Z7189 Other specified counseling: Secondary | ICD-10-CM | POA: Diagnosis not present

## 2022-11-21 DIAGNOSIS — Z515 Encounter for palliative care: Secondary | ICD-10-CM | POA: Diagnosis not present

## 2022-11-21 DIAGNOSIS — N184 Chronic kidney disease, stage 4 (severe): Secondary | ICD-10-CM

## 2022-11-21 DIAGNOSIS — K219 Gastro-esophageal reflux disease without esophagitis: Secondary | ICD-10-CM

## 2022-11-21 DIAGNOSIS — N179 Acute kidney failure, unspecified: Secondary | ICD-10-CM

## 2022-11-21 DIAGNOSIS — F0390 Unspecified dementia without behavioral disturbance: Secondary | ICD-10-CM

## 2022-11-21 DIAGNOSIS — N390 Urinary tract infection, site not specified: Secondary | ICD-10-CM

## 2022-11-21 DIAGNOSIS — E118 Type 2 diabetes mellitus with unspecified complications: Secondary | ICD-10-CM

## 2022-11-21 DIAGNOSIS — R0902 Hypoxemia: Secondary | ICD-10-CM

## 2022-11-21 DIAGNOSIS — I272 Pulmonary hypertension, unspecified: Secondary | ICD-10-CM

## 2022-11-21 DIAGNOSIS — Z66 Do not resuscitate: Secondary | ICD-10-CM | POA: Diagnosis not present

## 2022-11-21 LAB — GLUCOSE, CAPILLARY: Glucose-Capillary: 157 mg/dL — ABNORMAL HIGH (ref 70–99)

## 2022-11-21 LAB — CBC WITH DIFFERENTIAL/PLATELET
Abs Immature Granulocytes: 0.01 10*3/uL (ref 0.00–0.07)
Basophils Absolute: 0 10*3/uL (ref 0.0–0.1)
Basophils Relative: 1 %
Eosinophils Absolute: 0.2 10*3/uL (ref 0.0–0.5)
Eosinophils Relative: 7 %
HCT: 28.4 % — ABNORMAL LOW (ref 36.0–46.0)
Hemoglobin: 9.3 g/dL — ABNORMAL LOW (ref 12.0–15.0)
Immature Granulocytes: 0 %
Lymphocytes Relative: 22 %
Lymphs Abs: 0.8 10*3/uL (ref 0.7–4.0)
MCH: 31.7 pg (ref 26.0–34.0)
MCHC: 32.7 g/dL (ref 30.0–36.0)
MCV: 96.9 fL (ref 80.0–100.0)
Monocytes Absolute: 0.4 10*3/uL (ref 0.1–1.0)
Monocytes Relative: 12 %
Neutro Abs: 2.1 10*3/uL (ref 1.7–7.7)
Neutrophils Relative %: 58 %
Platelets: 172 10*3/uL (ref 150–400)
RBC: 2.93 MIL/uL — ABNORMAL LOW (ref 3.87–5.11)
RDW: 15.5 % (ref 11.5–15.5)
WBC: 3.5 10*3/uL — ABNORMAL LOW (ref 4.0–10.5)
nRBC: 0 % (ref 0.0–0.2)

## 2022-11-21 LAB — SODIUM, URINE, RANDOM: Sodium, Ur: 10 mmol/L

## 2022-11-21 LAB — RENAL FUNCTION PANEL
Albumin: 2.8 g/dL — ABNORMAL LOW (ref 3.5–5.0)
Anion gap: 7 (ref 5–15)
BUN: 50 mg/dL — ABNORMAL HIGH (ref 8–23)
CO2: 21 mmol/L — ABNORMAL LOW (ref 22–32)
Calcium: 8.7 mg/dL — ABNORMAL LOW (ref 8.9–10.3)
Chloride: 106 mmol/L (ref 98–111)
Creatinine, Ser: 2.94 mg/dL — ABNORMAL HIGH (ref 0.44–1.00)
GFR, Estimated: 15 mL/min — ABNORMAL LOW (ref >60)
Glucose, Bld: 114 mg/dL — ABNORMAL HIGH (ref 70–99)
Phosphorus: 4.9 mg/dL — ABNORMAL HIGH (ref 2.5–4.6)
Potassium: 4 mmol/L (ref 3.5–5.1)
Sodium: 134 mmol/L — ABNORMAL LOW (ref 135–145)

## 2022-11-21 LAB — CREATININE, URINE, RANDOM: Creatinine, Urine: 141 mg/dL

## 2022-11-21 LAB — URINALYSIS, ROUTINE W REFLEX MICROSCOPIC
Bilirubin Urine: NEGATIVE
Glucose, UA: NEGATIVE mg/dL
Hgb urine dipstick: NEGATIVE
Ketones, ur: NEGATIVE mg/dL
Nitrite: NEGATIVE
Protein, ur: 100 mg/dL — AB
Specific Gravity, Urine: 1.014 (ref 1.005–1.030)
pH: 5 (ref 5.0–8.0)

## 2022-11-21 LAB — IRON AND TIBC
Iron: 49 ug/dL (ref 28–170)
Saturation Ratios: 21 % (ref 10.4–31.8)
TIBC: 237 ug/dL — ABNORMAL LOW (ref 250–450)
UIBC: 188 ug/dL

## 2022-11-21 LAB — MAGNESIUM: Magnesium: 1.8 mg/dL (ref 1.7–2.4)

## 2022-11-21 LAB — FERRITIN: Ferritin: 195 ng/mL (ref 11–307)

## 2022-11-21 MED ORDER — METOPROLOL TARTRATE 50 MG PO TABS
50.0000 mg | ORAL_TABLET | Freq: Two times a day (BID) | ORAL | Status: DC
  Administered 2022-11-21 – 2022-11-25 (×9): 50 mg via ORAL
  Filled 2022-11-21 (×9): qty 1

## 2022-11-21 MED ORDER — ISOSORBIDE MONONITRATE ER 30 MG PO TB24
15.0000 mg | ORAL_TABLET | Freq: Every day | ORAL | Status: DC
  Administered 2022-11-21 – 2022-11-25 (×5): 15 mg via ORAL
  Filled 2022-11-21 (×5): qty 1

## 2022-11-21 MED ORDER — SODIUM CHLORIDE 0.9 % IV SOLN
2.0000 g | INTRAVENOUS | Status: DC
  Administered 2022-11-21 – 2022-11-22 (×2): 2 g via INTRAVENOUS
  Filled 2022-11-21 (×2): qty 20

## 2022-11-21 MED ORDER — AMLODIPINE BESYLATE 2.5 MG PO TABS
2.5000 mg | ORAL_TABLET | Freq: Every day | ORAL | Status: DC
  Administered 2022-11-21 – 2022-11-25 (×5): 2.5 mg via ORAL
  Filled 2022-11-21 (×5): qty 1

## 2022-11-21 MED ORDER — HYDRALAZINE HCL 25 MG PO TABS
25.0000 mg | ORAL_TABLET | Freq: Three times a day (TID) | ORAL | Status: DC
  Administered 2022-11-21 – 2022-11-25 (×13): 25 mg via ORAL
  Filled 2022-11-21 (×14): qty 1

## 2022-11-21 NOTE — Progress Notes (Signed)
PROGRESS NOTE    [name]  ZOX:[id number] DOB: 08-Sep-1928 DOA: 11/15/2022 PCP: Renford Dills, MD    Chief Complaint  Patient presents with   Foot Swelling    Brief Narrative:  [name] is a 89 y.o. female with PMH significant of chronic diastolic CHF, CKD 4, emphysematous cystitis, uncontrolled hypertension, chronic pain, seizure disorder, gout. Patient was brought in by daughter secondary to complaints of poor p.o. intake as well as swelling in the leg. CXR with atelectasis vs infiltrates.  BNP >1900.  ECG old RBBB.  SpO2 88%. treated with IV diuresis. Developed AKI with serum creatinine trending from 1-2.3 at the time of admission to 3.03.  IV albumin ordered, patient placed on IV fluids and Lasix discontinued.    Assessment & Plan:   Principal Problem:   Acute on chronic diastolic CHF (congestive heart failure) Active Problems:   Acute renal failure superimposed on stage 4 chronic kidney disease   Type 2 diabetes mellitus with complication, without long-term current use of insulin   Hypertension   GERD (gastroesophageal reflux disease)   Dementia   History of femur fracture   Severe pulmonary hypertension   Acute on chronic diastolic (congestive) heart failure  #1 acute on chronic diastolic CHF -Patient had presented with lower extremity swelling, poor oral intake, chest x-ray concerning for congestion versus atelectasis. -Patient currently afebrile.if infectious etiology. -Noted initially on presentation with sats of 88% however currently with sats of 95% on 2 L nasal cannula. -Patient noted initially on presentation to have been placed on IV Lasix however due to worsening renal function diuresis held and patient placed on gentle hydration. -2D echo obtained with a EF of 60 to 65%, grade 1 diastolic dysfunction, right ventricular overload with severe pulmonary hypertension and moderate tricuspid regurgitation. -It is noted that per family patient with 141 pounds 2  weeks prior to admission and on day of admission was noted at 154 pounds. -Titrate O2. -Continue to hold diuretics. -Supportive care.  2.  Chest tightness -Resolved. -Likely secondary to problem #1. -EKG with no ischemic changes noted. -Minimally elevated troponin not consistent with ACS. -Decrease dose of Imdur to 15 mg daily.  3.  Acute renal failure on CKD stage IV -Baseline fattening noted approximately 1.7. -On admission patient noted to have a serum creatinine of 2.3 felt likely secondary to prerenal etiology secondary to acute CHF exacerbation. -Renal ultrasound negative for hydronephrosis. -Creatinine initially improved with diuresis however starting to trend back up and as such diuretics discontinued and patient placed on gentle hydration and treated with IV albumin. -Creatinine currently at 3.03 (11/20/2022). -A.m. labs pending. -Repeat UA with cultures and sensitivities, check a urine culture, check a urine sodium, urine creatinine. -Due to worsening renal function consulted nephrology for further evaluation and management.  4.  Epigastric tenderness -Resolved. -Lipase within normal limits.  LFTs within normal limits.  Abdominal films unremarkable.  5.  Type 2 diabetes mellitus without long-term insulin use -Hemoglobin A1c 6.1 (11/15/2022). -Glucose levels on labs this morning not noted. -Check CBGs before meals and at bedtime.  6.  Hypertensive urgency -Patient noted to have had significantly elevated blood pressures per prior physician and blood pressure better controlled on regimen of Norvasc 10 mg daily, metoprolol 100 mg twice daily, hydralazine 50 mg every 8 hours, Imdur 30 mg daily. -Systolic blood pressures in the 120s, patient 89 years of age with history of CKD stage IV. -Will change Norvasc to 2.5 mg daily, decrease hydralazine to 25  mg p.o. every 8 hours, decrease Imdur to 15 mg daily, decrease Lopressor to 50 mg twice daily. -Follow-up.  7.   GERD -PPI.  8.  Dementia -Mental status seems somewhat stable.  No behavioral issues. -Continue Seroquel. -It is noted per prior physician that daughter holds medicine once patient is more sleepy and only uses it to help sleep at night. -Currently on Seroquel 12.5 mg which we will continue for now and change to as needed in the next 1 to 2 days. -Continue Remeron. -Patient started on Marinol as appetite stimulant per primary physician.  9.  History of seizures -Keppra.  10.  Chronic pain -Continue current regimen of gabapentin dose adjusted due to AKI.  11.  Recurrent UTI -Patient noted to have started on Macrobid recently, due to worsening renal function felt not a good candidate to continue Macrobid and it was discontinued. -Urinalysis done on admission with moderate leukocytes, nitrite negative, many bacteria, WBCs > 50.  -Repeat UA with cultures and sensitivities. -Placed empirically on IV Rocephin pending urine culture results.  12.  Severe pulmonary hypertension -Patient has not seen a cardiologist on a regular basis. -Due to patient's age prognosis very poor. -Supportive care. -Palliative care consulted.  13.  Goals of care discussion -Palliative care consulted who has seen patient patient currently made a DNR. -Dr. Allena Katz discussed with patient's daughter that if renal function improves and shortness of breath improves and does not worsen patient may be discharged home soon however if condition worsens may need to consider transitioning to comfort measures. -    DVT prophylaxis: Heparin Code Status: DNR Family Communication: Updated patient and friend at bedside. Disposition: TBD  Status is: Inpatient Remains inpatient appropriate because: Severity of illness   Consultants:  Palliative care: Dr. Patterson Hammersmith 11/20/2022 Nephrology pending  Procedures:  Chest x-ray 11/15/2022 Plain films of the abdomen 11/15/2022 Renal ultrasound 11/15/2022 2D echo  11/16/2022   Antimicrobials:  Anti-infectives (From admission, onward)    Start     Dose/Rate Route Frequency Ordered Stop   11/15/22 1900  nitrofurantoin (macrocrystal-monohydrate) (MACROBID) capsule 100 mg  Status:  Discontinued        100 mg Oral Daily 11/15/22 1802 11/15/22 1832         Subjective: Lying in bed.  Easily arousable.  States shortness of breath has improved since admission.  Feels lower extremity edema has improved significantly.  No chest pain.  No abdominal pain.  Poor oral intake per friend at bedside.  Objective: Vitals:   11/21/22 0000 11/21/22 0425 11/21/22 0600 11/21/22 0720  BP: (!) 124/51 (!) 111/54 (!) 137/58 (!) 124/48  Pulse: 65 64 60 (!) 57  Resp: Temp: 97.8 F (36.6 C) (!) 97.4 F (36.3 C)  (!) 97.4 F (36.3 C)  TempSrc: Oral Oral    SpO2: 98% 99% 99% 95%  Weight:  75.7 kg    Height:        Intake/Output Summary (Last 24 hours) at 11/21/2022 1251 Last data filed at 11/21/2022 1100 Gross per 24 hour  Intake 1339.56 ml  Output 400 ml  Net 939.56 ml   Filed Weights   11/20/22 0038 11/20/22 0500 11/21/22 0425  Weight: 73.3 kg 73.3 kg 75.7 kg    Examination:  General exam: NAD. Respiratory system: Bibasilar crackles.  No wheezing.  Fair air movement.  Speaking in full sentences.  Cardiovascular system: Regular rate and rhythm no murmurs rubs or gallops.  Trace to 1+ bilateral lower extremity  edema.   Gastrointestinal system: Abdomen is nondistended, soft and nontender. No organomegaly or masses felt. Normal bowel sounds heard. Central nervous system: Alert and oriented. No focal neurological deficits. Extremities: Trace to 1+ bilateral lower extremity edema.  Symmetric 5 x 5 power. Skin: No rashes, lesions or ulcers Psychiatry: Judgement and insight appear normal. Mood & affect appropriate.     Data Reviewed: I have personally reviewed following labs and imaging studies  CBC: Recent Labs  Lab 11/16/22 0103  11/17/22 0058 11/18/22 0100 11/19/22 0053 11/21/22 0855  WBC 4.6 4.7 3.7* 3.9* 3.5*  NEUTROABS  --   --   --   --  2.1  HGB 9.5* 9.4* 9.2* 9.2* 9.3*  HCT 29.7* 28.7* 27.3* 29.1* 28.4*  MCV 97.1 96.0 95.5 98.0 96.9  PLT 217 207 197 198 172    Basic Metabolic Panel: Recent Labs  Lab 11/17/22 0058 11/18/22 0100 11/19/22 0053 11/20/22 0745 11/21/22 0855  NA 133* 135 133* 130* 134*  K 3.6 3.6 3.9 4.0 4.0  CL 104 105 103 99 106  CO2 21* 22 22 21* 21*  GLUCOSE 181* 104* 120* 99 114*  BUN 41* 43* 45* 48* 50*  CREATININE 2.45* 2.75* 2.84* 3.03* 2.94*  CALCIUM 8.5* 8.6* 8.8* 9.0 8.7*  MG 2.1 2.0 2.1 1.9 1.8  PHOS  --   --   --  4.8* 4.9*    GFR: Estimated Creatinine Clearance: 11.9 mL/min (A) (by C-G formula based on SCr of 2.94 mg/dL (H)).  Liver Function Tests: Recent Labs  Lab 11/15/22 2039 11/16/22 0103 11/20/22 0745 11/21/22 0855  AST 36 38  --   --   ALT 37 33  --   --   ALKPHOS 178* 167*  --   --   BILITOT 0.4 0.4  --   --   PROT 6.4* 6.0*  --   --   ALBUMIN 3.1* 2.8* 2.9* 2.8*    CBG: Recent Labs  Lab 11/15/22 1801 11/15/22 2105 11/16/22 0616 11/16/22 2021  GLUCAP 105* 133* 93 113*     No results found for this or any previous visit (from the past 240 hour(s)).       Radiology Studies: No results found.      Scheduled Meds:  acetaminophen  500 mg Oral BID   amLODipine  2.5 mg Oral Daily   dronabinol  2.5 mg Oral BID AC   gabapentin  100 mg Oral QHS   heparin  5,000 Units Subcutaneous Q8H   hydrALAZINE  25 mg Oral Q8H   isosorbide mononitrate  15 mg Oral Daily   levETIRAcetam  500 mg Oral BID   metoprolol tartrate  50 mg Oral BID   mirtazapine  15 mg Oral QHS   pantoprazole  40 mg Oral Daily   QUEtiapine  12.5 mg Oral QHS   sodium chloride flush  3 mL Intravenous Q12H   Continuous Infusions:   LOS: 5 days    Time spent: 40 minutes    Ramiro Harvest, MD [facility]   To contact the attending provider between  7A-7P or the covering provider during after hours 7P-7A, please log into the web site [email or web address] and access using universal [place] password for that web site. If you do not have the password, please call the hospital operator.  11/21/2022, 12:51 PM

## 2022-11-21 NOTE — Consult Note (Signed)
[facility] Renal Consultation Note  Requesting MD: Ramiro Harvest, MD Indication for Consultation:  AKI   Chief complaint: leg swelling and poor oral intake   HPI:  [name] is a 89 y.o. female with a history of chronic diastolic CHF, dementia, CKD stage IV, diabetes mellitus, and hypertension who presented to the hospital with poor intake and leg swelling.  Her daughter is her caregiver and reported compliance with lasix 40 mg daily.  She was found to be hypoxic to 88% and got lasix IV on 4/18 and 4/19.  This was then held and she was hydrated with normal saline after noted Cr rise.  Creatinine trends are below.  Cr 2.31 on 4/18 and peak of 3.03 on 4/23.  Repeat Creatinine 2.94 today.  Today, her blood pressure medications were reduced.  Nephrology is consulted for assistance with management of AKI on CKD stage IV.  Note AKI event in 06/2022 as below peak Cr 5.93. Past baseline Cr appears to be near 2.  She had 400 mL uop over 4/23 charted and uop thus far today.  She has been on 2 liters oxygen.  She had a renal US earlier this admission with normal echogenicity and no hydro.   I spoke with the patient and her daughter at bedside.  Her daughter is an excellent historian.  She has compression devices at home that they use on her legs to help with fluid.  She hasn't been eating great and doesn't like much of the food on her plate tonight.  She does like salt in her food.  She has had AKI events in the past year as noted and some health setbacks.  However, prior to November, this patient was walking without an assist device and would sometimes wear high heels.  She is still fairly independent with many ADL's at home per her daughter.  Her blood pressure is normally not well-controlled.  Her daughter states that after a prior admission she required a foley.  She is now continent and she reports that they did a bladder scan that was acceptable a few days ago.  (Per charting 39 mL on  4/21).  Her daughter states that she is a little more confused than normal - normally she would be able to look at the calendar on the wall and reorient herself and tell me the year.   Creatinine, Ser  Date/Time Value Ref Range Status  11/21/2022 08:55 AM 2.94 (H) 0.44 - 1.00 mg/dL Final  91/47/8295 62:13 AM 3.03 (H) 0.44 - 1.00 mg/dL Final  08/65/7846 96:29 AM 2.84 (H) 0.44 - 1.00 mg/dL Final  52/84/1324 40:10 AM 2.75 (H) 0.44 - 1.00 mg/dL Final  27/25/3664 40:34 AM 2.45 (H) 0.44 - 1.00 mg/dL Final  74/25/9563 87:56 AM 2.23 (H) 0.44 - 1.00 mg/dL Final  43/32/9518 84:16 PM 2.24 (H) 0.44 - 1.00 mg/dL Final  60/63/0160 10:93 AM 2.31 (H) 0.44 - 1.00 mg/dL Final  23/55/7322 02:54 AM 1.91 (H) 0.44 - 1.00 mg/dL Final  27/12/2374 28:31 AM 2.46 (H) 0.44 - 1.00 mg/dL Final  51/76/1607 37:10 PM 2.76 (H) 0.44 - 1.00 mg/dL Final  62/69/4854 62:70 AM 1.62 (H) 0.44 - 1.00 mg/dL Final  35/00/9381 82:99 AM 1.57 (H) 0.44 - 1.00 mg/dL Final  37/16/9678 93:81 AM 1.54 (H) 0.44 - 1.00 mg/dL Final  01/75/1025 85:27 AM 1.66 (H) 0.44 - 1.00 mg/dL Final  78/24/2353 61:44 AM 1.90 (H) 0.44 - 1.00 mg/dL Final  31/54/0086 76:19 AM 2.56 (H) 0.44 -  1.00 mg/dL Final  88/41/6606 30:16 AM 3.24 (H) 0.44 - 1.00 mg/dL Final  08/07/3233 57:32 AM 4.09 (H) 0.44 - 1.00 mg/dL Final  20/25/4270 62:37 AM 5.00 (H) 0.44 - 1.00 mg/dL Final  62/83/1517 61:60 AM 5.59 (H) 0.44 - 1.00 mg/dL Final  73/71/0626 94:85 PM 5.93 (H) 0.44 - 1.00 mg/dL Final  46/27/0350 09:38 AM 1.81 (H) 0.44 - 1.00 mg/dL Final  18/29/9371 69:67 AM 1.78 (H) 0.44 - 1.00 mg/dL Final  89/38/1017 51:02 AM 1.97 (H) 0.44 - 1.00 mg/dL Final  58/52/7782 42:35 AM 1.88 (H) 0.44 - 1.00 mg/dL Final  36/14/4315 40:08 AM 1.89 (H) 0.44 - 1.00 mg/dL Final  67/61/9509 32:67 AM 1.79 (H) 0.44 - 1.00 mg/dL Final  12/45/8099 83:38 AM 2.52 (H) 0.44 - 1.00 mg/dL Final  25/11/3974 73:41 AM 3.76 (H) 0.44 - 1.00 mg/dL Final  93/79/0240 97:35 AM 4.69 (H) 0.44 - 1.00 mg/dL Final   32/99/2426 83:41 PM 4.45 (H) 0.44 - 1.00 mg/dL Final  96/22/2979 89:21 AM 1.47 (H) 0.44 - 1.00 mg/dL Final  19/41/7408 14:48 AM 1.60 (H) 0.44 - 1.00 mg/dL Final  18/56/3149 70:26 AM 1.80 (H) 0.44 - 1.00 mg/dL Final  37/85/8850 27:74 AM 2.03 (H) 0.44 - 1.00 mg/dL Final  12/87/8676 72:09 AM 2.17 (H) 0.44 - 1.00 mg/dL Final  47/03/6282 66:29 PM 1.95 (H) 0.44 - 1.00 mg/dL Final  47/65/4650 35:46 AM 1.97 (H) 0.44 - 1.00 mg/dL Final  56/81/2751 70:01 AM 1.39 (H) 0.44 - 1.00 mg/dL Final  74/94/4967 59:16 AM 1.56 (H) 0.44 - 1.00 mg/dL Final  38/46/6599 35:70 AM 1.90 (H) 0.44 - 1.00 mg/dL Final  17/79/3903 00:92 AM 1.81 (H) 0.44 - 1.00 mg/dL Final  33/00/7622 63:33 PM 2.03 (H) 0.44 - 1.00 mg/dL Final  54/56/2563 89:37 PM 2.04 (H) 0.44 - 1.00 mg/dL Final  34/28/7681 15:72 PM 2.24 (H) 0.44 - 1.00 mg/dL Final  62/09/5595 41:63 PM 2.33 (H) 0.44 - 1.00 mg/dL Final  84/53/6468 03:21 PM 1.57 (H) 0.44 - 1.00 mg/dL Final  22/48/2500 37:04 PM 1.28 (H) 0.50 - 1.10 mg/dL Final  88/89/1694 50:38 AM 0.98 0.4 - 1.2 mg/dL Final  88/28/0034 91:79 AM 0.79 0.4 - 1.2 mg/dL Final  15/11/6977 48:01 AM 0.83 0.4 - 1.2 mg/dL Final     PMHx:   Past Medical History:  Diagnosis Date   Dementia    Diabetes mellitus    GERD (gastroesophageal reflux disease)    Glaucoma    Heart disease    Hypertension    Tinnitus     Past Surgical History:  Procedure Laterality Date   BREAST LUMPECTOMY     bil for breast ca   ORIF FEMUR FRACTURE Right 07/03/2022   Procedure: OPEN REDUCTION INTERNAL FIXATION (ORIF) DISTAL FEMUR FRACTURE;  Surgeon: Myrene Galas, MD;  Location: [facility] OR;  Service: Orthopedics;  Laterality: Right;    Family Hx:  Family History  Problem Relation Age of Onset   Diabetes Sister     Social History:  reports that she has quit smoking. She has never used smokeless tobacco. She reports that she does not drink alcohol and does not use drugs.  Allergies:  Allergies  Allergen Reactions   Ace  Inhibitors Swelling, Rash and Cough   Prandin [Repaglinide] Other (See Comments)    Caused significant peripheral edema   Dilaudid [Hydromorphone Hcl] Itching and Other (See Comments)    Hallucinations (auditory and visual) also   Other Nausea And Vomiting    "Tussionex Pennkinetic ER"   Latex  Rash   Tape Rash    Prefers PAPER TAPE, PLEASE!!    Medications: Prior to Admission medications   Medication Sig Start Date End Date Taking? Authorizing Provider  acetaminophen (TYLENOL) 500 MG tablet Take 2 tablets (1,000 mg total) by mouth every 8 (eight) hours as needed for mild pain or moderate pain. Patient taking differently: Take 1,000 mg by mouth 2 (two) times daily as needed for mild pain or moderate pain. 07/25/22  Yes Ghimire, Werner Lean, MD  allopurinol (ZYLOPRIM) 100 MG tablet Take 100 mg by mouth 2 (two) times daily.   Yes [provider]  amLODipine (NORVASC) 10 MG tablet Take 10 mg by mouth daily.   Yes [provider]  furosemide (LASIX) 40 MG tablet Take 40 mg by mouth daily. 08/31/22  Yes [provider]  gabapentin (NEURONTIN) 100 MG capsule Take 100 mg by mouth 2 (two) times daily.   Yes [provider]  HYDRALAZINE HCL PO Take 1 tablet by mouth 3 (three) times daily.   Yes [provider]  levETIRAcetam (KEPPRA) 500 MG tablet Take 500 mg by mouth 2 (two) times daily.   Yes [provider]  metoprolol tartrate (LOPRESSOR) 100 MG tablet Take 100 mg by mouth 2 (two) times daily. 08/03/20  Yes [provider]  mirtazapine (REMERON) 15 MG tablet Take 15 mg by mouth at bedtime.    Yes [provider]  Multiple Vitamins-Minerals (CENTRUM SILVER 50+WOMEN) TABS Take 1 tablet by mouth daily with breakfast.   Yes [provider]  nitrofurantoin, macrocrystal-monohydrate, (MACROBID) 100 MG capsule Take 100 mg by mouth daily.   Yes [provider]  omeprazole (PRILOSEC) 20 MG capsule Take 20 mg by mouth  every Monday, Wednesday, and Friday.   Yes [provider]  QUEtiapine (SEROQUEL) 25 MG tablet Take 25 mg by mouth daily. 08/31/22  Yes [provider]  blood glucose meter kit and supplies KIT Dispense based on patient and insurance preference. Use up to four times daily as directed. (FOR ICD-9 250.00, 250.01). For QAC - HS accuchecks. 11/27/20   Leroy Sea, MD  Continuous Blood Gluc Sensor (FREESTYLE LIBRE 2 SENSOR) MISC USE AS DIRECTED. REAPPLY EVERY 14 DAYS Patient taking differently: Inject 1 Device into the skin every 14 (fourteen) days. 11/20/21   Romero Belling, MD    I have reviewed the patient's current and reported prior to admission medications.  Labs:     Latest Ref Rng & Units 11/21/2022    8:55 AM 11/20/2022    7:45 AM 11/19/2022   12:53 AM  BMP  Glucose 70 - 99 mg/dL 161  99  096   BUN 8 - 23 mg/dL 50  48  45   Creatinine 0.44 - 1.00 mg/dL 0.45  4.09  8.11   Sodium 135 - 145 mmol/L 134  130  133   Potassium 3.5 - 5.1 mmol/L 4.0  4.0  3.9   Chloride 98 - 111 mmol/L 106  99  103   CO2 22 - 32 mmol/L Calcium 8.9 - 10.3 mg/dL 8.7  9.0  8.8     Urinalysis    Component Value Date/Time   COLORURINE YELLOW 11/21/2022 1250   APPEARANCEUR CLOUDY (A) 11/21/2022 1250   LABSPEC 1.014 11/21/2022 1250   PHURINE 5.0 11/21/2022 1250   GLUCOSEU NEGATIVE 11/21/2022 1250   HGBUR NEGATIVE 11/21/2022 1250   BILIRUBINUR NEGATIVE 11/21/2022 1250   KETONESUR NEGATIVE 11/21/2022 1250  PROTEINUR 100 (A) 11/21/2022 1250   UROBILINOGEN 0.2 11/21/2008 1820   NITRITE NEGATIVE 11/21/2022 1250   LEUKOCYTESUR LARGE (A) 11/21/2022 1250     ROS:  Pertinent items noted in HPI and remainder of comprehensive ROS otherwise negative. However note limitation of dementia/cognitive impairment  Physical Exam: Vitals:   11/21/22 0720 11/21/22 1310  BP: (!) 124/48 (!) 120/50  Pulse: (!) 57 61  Resp: 17 18  Temp: (!) 97.4 F (36.3 C) 97.6 F (36.4 C)  SpO2: 95%  96%     General: elderly female in bed in NAD  HEENT: NCAT Eyes: EOMI sclera anicteric Neck: supple trachea midline  Heart: S1S2 no rub Lungs: clear to auscultation; normal work of breathing on 1.5 liters  Abdomen: soft/nt/nd Extremities: 1+ edema bilateral lower extremities; legs somewhat tender  Skin: no rash on extremities exposed  Neuro: provides her name and location of hospital.  She does not answer year.   GU no foley   Assessment/Plan:  # AKI  - Pre-renal insults.  Team has reduced her anti-hypertensives to avoid exacerbating any normotensive ATN.  Her daughter states that her blood pressure is not normally well-controlled.  She has a history of prior retention but wasn't retaining on the last check.   - Cr appears to have plateaued  - continue supportive care  - check post-void bladder scan and in/out cath if over 300 ml urine retained   # CKD stage IV  - Baseline Cr near 2   # Chronic diastolic CHF  - apply SCD's and TED hose to assist with compression and fluid removal  - defer further fluids - she has a bit of a cough  - defer lasix for now but reassess daily   # HTN  - Note reduction in her prior regimen  - Controlled at present   # Dementia  - appreciate staff and family supplementing her history   # Anemia of CKD  - add on iron panel   # Possible UTI  - note urine culture was sent per team   Disposition - would continue inpatient monitoring    Estanislado Emms 11/21/2022, 5:51 PM

## 2022-11-21 NOTE — Progress Notes (Signed)
Daily Progress Note   Patient Name: [name]       Date: 11/21/2022 DOB: Mar 22, 1929  Age: 89 y.o. MRN#: [id number] Attending Physician: Rodolph Bong, MD Primary Care Physician: Renford Dills, MD Admit Date: 11/15/2022 Length of Stay: 5 days  Reason for Consultation/Follow-up: Establishing goals of care  HPI/Patient Profile:  89 y.o. female  with past medical history of chronic diastolic CHF, CKD 4, emphysematous cystitis, uncontrolled hypertension, chronic pain, seizure disorder, gout. Patient was brought in by daughter secondary to complaints of poor p.o. intake as well as swelling in the leg. admitted on 11/15/2022 with acute on chronic diastolic CHF, acute hypoxia, acute renal failure superimposed on stage IV chronic kidney disease, dementia, severe pulmonary hypertension, and others.    PMT was consulted for GOC conversations.  Subjective:   Subjective: Chart Reviewed. Updates received. Patient Assessed. Created space and opportunity for patient  and family to explore thoughts and feelings regarding current medical situation.  Today's Discussion: Today saw the patient at bedside, her caregiver was present.  The patient was in bed.  She knows her name and that she is in [place] (thinks she is at [facility] but easily directed to [facility]).  She seems a bit more oriented today.  However, she does seem a bit more lethargic as well.  Caregiver states that she did not eat much breakfast.  The patient initially describes pain "all over" but then later localizes it to the new IV insertion site in her left wrist.  We discussed that we are waiting for labs to come back.  We also discussed the plan to allow some time for outcomes to see how her kidneys do and how she does clinically.  We discussed our plan to continue palliative involvement and to follow-up with the patient and her daughter.  All seem to be in agreement.  I provided emotional and general support through  therapeutic listening, empathy, sharing of stories, and other techniques. I answered all questions and addressed all concerns to the best of my ability.  Review of Systems  Constitutional:  Positive for fatigue.  Respiratory:  Negative for shortness of breath.   Gastrointestinal:  Negative for abdominal pain, nausea and vomiting.  Musculoskeletal:        Pain left wrist at IV site    Objective:   Vital Signs:  BP (!) 120/50 (BP Location: Left Arm)   Pulse 61   Temp 97.6 F (36.4 C)   Resp 18   Ht  (1.575 m)   Wt 75.7 kg   SpO2 96%   BMI 30.52 kg/m   Physical Exam: Physical Exam Vitals and nursing note reviewed.  Constitutional:      General: She is sleeping. She is not in acute distress. HENT:     Head: Normocephalic and atraumatic.  Pulmonary:     Effort: Pulmonary effort is normal. No respiratory distress.  Abdominal:     General: Abdomen is flat.  Skin:    General: Skin is warm and dry.  Neurological:     Mental Status: She is easily aroused. She is disoriented.  Psychiatric:        Mood and Affect: Mood normal.        Behavior: Behavior normal.     Palliative Assessment/Data: 20%    Existing Vynca/ACP Documentation: None  Assessment & Plan:   Impression: Present on Admission:  Acute on chronic diastolic CHF (congestive heart failure)  Acute renal failure superimposed on stage  4 chronic kidney disease  Type 2 diabetes mellitus with complication, without long-term current use of insulin  Hypertension  GERD (gastroesophageal reflux disease)  Dementia  Severe pulmonary hypertension  Acute on chronic diastolic (congestive) heart failure  89 year old female with chronic comorbidities and acute presentation as described above. The patient has had a significant decline over the past 6 months or so. While in the hospital she has had worsening kidney function in the setting of stage IV chronic kidney disease and CHF. Her creatinine continues to  increase, diuretics have been held due to this. We have discussed ultimate goal of no suffering. We will allow time for outcomes to see how she does in the coming 1 to 2 days. Depending on her clinical progress we can have further discussions on comfort care and/or hospice if this becomes necessary. Daughter is in agreement. Overall prognosis is poor.   SUMMARY OF RECOMMENDATIONS   Remain DNR Continue time for outcomes PMT will follow-up tomorrow  Symptom Management:  Per primary team PMT is available to assist as needed  Code Status: DNR  Prognosis: Unable to determine  Discharge Planning: To Be Determined  Discussed with: Patient, patient's caregiver, medical team, nursing team  Thank you for allowing [place] to participate in the care of CHAYIL GANTT PMT will continue to support holistically.  Billing based on MDM: Moderate  Wynne Dust, NP Palliative Medicine Team  Team Phone # [phone] (Nights/Weekends)  03/28/2021, 8:17 AM

## 2022-11-21 NOTE — TOC Progression Note (Signed)
Transition of Care [facility]) - Progression Note    Patient Details  Name: [name] MRN: [id number] Date of Birth: October 10, 1928  Transition of Care [facility] CM/SW Contact  Leone Haven, RN Phone Number: 11/21/2022, 3:27 PM  Clinical Narrative:    MD continue to monitor kidney function. TOC following.    Expected Discharge Plan: Home w Home Health Services Barriers to Discharge: Continued Medical Work up  Expected Discharge Plan and Services In-house Referral: NA Discharge Planning Services: CM Consult Post Acute Care Choice: Home Health Living arrangements for the past 2 months: Single Family Home                 DME Arranged: N/A DME Agency: NA       HH Arranged: PT HH AgencyHotel manager Home Health Care Date HH Agency Contacted: 11/16/22 Time HH Agency Contacted: 1712 Representative spoke with at [facility] Agency: Kandee Keen   Social Determinants of Health (SDOH) Interventions SDOH Screenings   Food Insecurity: No Food Insecurity (07/08/2022)  Housing: Low Risk  (07/08/2022)  Transportation Needs: No Transportation Needs (07/08/2022)  Utilities: Not At Risk (07/08/2022)  Tobacco Use: Medium Risk (11/15/2022)    Readmission Risk Interventions    11/16/2022    5:11 PM 07/25/2022    1:23 PM  Readmission Risk Prevention Plan  Transportation Screening Complete Complete  HRI or Home Care Consult Complete   Palliative Care Screening Not Applicable   Medication Review (RN Care Manager) Complete Complete  PCP or Specialist appointment within 3-5 days of discharge  Complete  HRI or Home Care Consult  Complete  SW Recovery Care/Counseling Consult  Complete  Palliative Care Screening  Not Applicable  Skilled Nursing Facility  Patient Refused

## 2022-11-21 NOTE — Plan of Care (Signed)
  Problem: Education: Goal: Knowledge of General Education information will improve Description: Including pain rating scale, medication(s)/side effects and non-pharmacologic comfort measures 11/21/2022 0019 by Rockney Ghee, RN Outcome: Progressing 11/21/2022 0015 by Rockney Ghee, RN Outcome: Progressing   Problem: Health Behavior/Discharge Planning: Goal: Ability to manage health-related needs will improve Outcome: Progressing   Problem: Clinical Measurements: Goal: Ability to maintain clinical measurements within normal limits will improve Outcome: Progressing Goal: Will remain free from infection Outcome: Progressing Goal: Diagnostic test results will improve Outcome: Progressing Goal: Respiratory complications will improve Outcome: Progressing Goal: Cardiovascular complication will be avoided Outcome: Progressing   Problem: Activity: Goal: Risk for activity intolerance will decrease Outcome: Progressing   Problem: Coping: Goal: Level of anxiety will decrease Outcome: Progressing   Problem: Elimination: Goal: Will not experience complications related to urinary retention Outcome: Progressing   Problem: Safety: Goal: Ability to remain free from injury will improve Outcome: Progressing   Problem: Skin Integrity: Goal: Risk for impaired skin integrity will decrease Outcome: Progressing   Problem: Education: Goal: Ability to demonstrate management of disease process will improve Outcome: Progressing Goal: Ability to verbalize understanding of medication therapies will improve Outcome: Progressing Goal: Individualized Educational Video(s) Outcome: Progressing   Problem: Activity: Goal: Capacity to carry out activities will improve Outcome: Progressing   Problem: Cardiac: Goal: Ability to achieve and maintain adequate cardiopulmonary perfusion will improve Outcome: Progressing

## 2022-11-21 NOTE — Progress Notes (Signed)
2045 patient alert to self and place able to make needs known family at bedside stay ATC with patient. Patient ambulated to bathroom with walker bladder scanned after 3cc noted. Education on UT I to patient and patients daughter to encourage patient to drink water

## 2022-11-22 ENCOUNTER — Ambulatory Visit: Payer: PPO | Admitting: Physical Therapy

## 2022-11-22 DIAGNOSIS — Z7189 Other specified counseling: Secondary | ICD-10-CM | POA: Diagnosis not present

## 2022-11-22 DIAGNOSIS — E118 Type 2 diabetes mellitus with unspecified complications: Secondary | ICD-10-CM | POA: Diagnosis not present

## 2022-11-22 DIAGNOSIS — Z515 Encounter for palliative care: Secondary | ICD-10-CM | POA: Diagnosis not present

## 2022-11-22 DIAGNOSIS — I10 Essential (primary) hypertension: Secondary | ICD-10-CM | POA: Diagnosis not present

## 2022-11-22 DIAGNOSIS — I5033 Acute on chronic diastolic (congestive) heart failure: Secondary | ICD-10-CM | POA: Diagnosis not present

## 2022-11-22 DIAGNOSIS — Z66 Do not resuscitate: Secondary | ICD-10-CM | POA: Diagnosis not present

## 2022-11-22 DIAGNOSIS — N179 Acute kidney failure, unspecified: Secondary | ICD-10-CM | POA: Diagnosis not present

## 2022-11-22 LAB — CBC WITH DIFFERENTIAL/PLATELET
Abs Immature Granulocytes: 0.01 10*3/uL (ref 0.00–0.07)
Basophils Absolute: 0 10*3/uL (ref 0.0–0.1)
Basophils Relative: 1 %
Eosinophils Absolute: 0.3 10*3/uL (ref 0.0–0.5)
Eosinophils Relative: 8 %
HCT: 29.5 % — ABNORMAL LOW (ref 36.0–46.0)
Hemoglobin: 9.4 g/dL — ABNORMAL LOW (ref 12.0–15.0)
Immature Granulocytes: 0 %
Lymphocytes Relative: 25 %
Lymphs Abs: 0.9 10*3/uL (ref 0.7–4.0)
MCH: 31.4 pg (ref 26.0–34.0)
MCHC: 31.9 g/dL (ref 30.0–36.0)
MCV: 98.7 fL (ref 80.0–100.0)
Monocytes Absolute: 0.4 10*3/uL (ref 0.1–1.0)
Monocytes Relative: 12 %
Neutro Abs: 2 10*3/uL (ref 1.7–7.7)
Neutrophils Relative %: 54 %
Platelets: 171 10*3/uL (ref 150–400)
RBC: 2.99 MIL/uL — ABNORMAL LOW (ref 3.87–5.11)
RDW: 15.2 % (ref 11.5–15.5)
WBC: 3.7 10*3/uL — ABNORMAL LOW (ref 4.0–10.5)
nRBC: 0 % (ref 0.0–0.2)

## 2022-11-22 LAB — GLUCOSE, CAPILLARY
Glucose-Capillary: 118 mg/dL — ABNORMAL HIGH (ref 70–99)
Glucose-Capillary: 113 mg/dL — ABNORMAL HIGH (ref 70–99)
Glucose-Capillary: 138 mg/dL — ABNORMAL HIGH (ref 70–99)
Glucose-Capillary: 143 mg/dL — ABNORMAL HIGH (ref 70–99)

## 2022-11-22 LAB — URINE CULTURE: Culture: >=100000 — AB

## 2022-11-22 LAB — RENAL FUNCTION PANEL
Albumin: 2.8 g/dL — ABNORMAL LOW (ref 3.5–5.0)
Anion gap: 8 (ref 5–15)
BUN: 52 mg/dL — ABNORMAL HIGH (ref 8–23)
CO2: 20 mmol/L — ABNORMAL LOW (ref 22–32)
Calcium: 8.8 mg/dL — ABNORMAL LOW (ref 8.9–10.3)
Chloride: 105 mmol/L (ref 98–111)
Creatinine, Ser: 2.89 mg/dL — ABNORMAL HIGH (ref 0.44–1.00)
GFR, Estimated: 15 mL/min — ABNORMAL LOW (ref >60)
Glucose, Bld: 132 mg/dL — ABNORMAL HIGH (ref 70–99)
Phosphorus: 4.7 mg/dL — ABNORMAL HIGH (ref 2.5–4.6)
Potassium: 4 mmol/L (ref 3.5–5.1)
Sodium: 133 mmol/L — ABNORMAL LOW (ref 135–145)

## 2022-11-22 MED ORDER — HYDROCORTISONE 1 % EX CREA
1.0000 | TOPICAL_CREAM | Freq: Three times a day (TID) | CUTANEOUS | Status: DC | PRN
  Administered 2022-11-22 – 2022-11-25 (×2): 1 via TOPICAL
  Filled 2022-11-22: qty 28

## 2022-11-22 MED ORDER — FUROSEMIDE 10 MG/ML IJ SOLN
40.0000 mg | Freq: Once | INTRAMUSCULAR | Status: AC
  Administered 2022-11-22: 40 mg via INTRAVENOUS
  Filled 2022-11-22: qty 4

## 2022-11-22 MED ORDER — POLYSACCHARIDE IRON COMPLEX 150 MG PO CAPS
150.0000 mg | ORAL_CAPSULE | Freq: Every day | ORAL | Status: DC
  Administered 2022-11-22 – 2022-11-25 (×4): 150 mg via ORAL
  Filled 2022-11-22 (×4): qty 1

## 2022-11-22 NOTE — Progress Notes (Signed)
[facility] Progress Note  Name: [name] MRN: [id number] DOB: 23-Nov-1928  Chief Complaint:  Lower extremity swelling and not eating well   Subjective:  She had 250 mL uop charted over 4/24.  Suspect may not all be charted.  She just had a bath and is getting some lotion.  She has an aide with her now who is surprised at how swollen her thighs are.  She is not oriented to year so history is a little unreliable.   Review of systems:  She denies overt shortness of breath Denies n/v Denies chest pain    -------------- Background: [name] is a 89 y.o. female with a history of chronic diastolic CHF, dementia, CKD stage IV, diabetes mellitus, and hypertension who presented to the hospital with poor intake and leg swelling.  Her daughter is her caregiver and reported compliance with lasix 40 mg daily.  She was found to be hypoxic to 88% and got lasix IV on 4/18 and 4/19.  This was then held and she was hydrated with normal saline after noted Cr rise.  Creatinine trends are below.  Cr 2.31 on 4/18 and peak of 3.03 on 4/23.  Repeat Creatinine 2.94 today.  Today, her blood pressure medications were reduced.  Nephrology is consulted for assistance with management of AKI on CKD stage IV.  Note AKI event in 06/2022 as below peak Cr 5.93. Past baseline Cr appears to be near 2.  She had 400 mL uop over 4/23 charted and uop thus far today.  She has been on 2 liters oxygen.  She had a renal US earlier this admission with normal echogenicity and no hydro.  I spoke with the patient and her daughter at bedside.  Her daughter is an excellent historian.  She has compression devices at home that they use on her legs to help with fluid.  She hasn't been eating great and doesn't like much of the food on her plate tonight.  She does like salt in her food.  She has had AKI events in the past year as noted and some health setbacks.  However, prior to November, this patient was walking without an  assist device and would sometimes wear high heels.  She is still fairly independent with many ADL's at home per her daughter.  Her blood pressure is normally not well-controlled.  Her daughter states that after a prior admission she required a foley.  She is now continent and she reports that they did a bladder scan that was acceptable a few days ago.  (Per charting 39 mL on 4/21).  Her daughter states that she is a little more confused than normal - normally she would be able to look at the calendar on the wall and reorient herself and tell me the year.    Intake/Output Summary (Last 24 hours) at 11/22/2022 1105 Last data filed at 11/21/2022 2200 Gross per 24 hour  Intake 220 ml  Output 50 ml  Net 170 ml    Vitals:  Vitals:   11/22/22 0029 11/22/22 0416 11/22/22 0713 11/22/22 0735  BP: (!) 137/51 (!) 146/57 (!) 134/55   Pulse:   64   Resp: Temp: 97.6 F (36.4 C) 98.1 F (36.7 C) (!) 97.4 F (36.3 C)   TempSrc: Oral Oral    SpO2: 96% 96% 99% 95%  Weight:  76.5 kg    Height:         Physical Exam:  General elderly female in bed in no acute distress HEENT normocephalic atraumatic extraocular movements intact sclera anicteric Neck supple trachea midline Lungs clear to auscultation bilaterally normal work of breathing at rest  Heart s1S2 no rub Abdomen soft nontender nondistended Extremities 1+ edema lower legs and edema of her thighs  Psych normal mood and affect Neuro alert and oriented to person and location not to year  Medications reviewed   Labs:     Latest Ref Rng & Units 11/22/2022   12:52 AM 11/21/2022    8:55 AM 11/20/2022    7:45 AM  BMP  Glucose 70 - 99 mg/dL 782  956  99   BUN 8 - 23 mg/dL 52  50  48   Creatinine 0.44 - 1.00 mg/dL 2.13  0.86  5.78   Sodium 135 - 145 mmol/L 133  134  130   Potassium 3.5 - 5.1 mmol/L 4.0  4.0  4.0   Chloride 98 - 111 mmol/L 105  106  99   CO2 22 - 32 mmol/L Calcium 8.9 - 10.3 mg/dL 8.8  8.7  9.0       Assessment/Plan:   # AKI  - Pre-renal insults.  Team has reduced her anti-hypertensives to avoid exacerbating any normotensive ATN.  Her daughter states that her blood pressure is not normally well-controlled.  She has a history of prior retention but wasn't retaining on the last check.   - Cr appears to have plateaued  - continue supportive care  - Please check post-void bladder scan and in/out cath if over 300 ml urine retained    # CKD stage IV  - Baseline Cr near 2    # Chronic diastolic CHF  - apply SCD's and TED hose to assist with compression and fluid removal - lasix 40 mg IV once now  - per charting home med is lasix 40 mg PO daily   # HTN  - Note reduction in her prior regimen and agree - Controlled at present    # Dementia  - she is typically fairly functional  - appreciate staff and family supplementing her history    # Anemia of CKD  - will add oral iron    # Possible UTI  - note urine culture was sent per team and is pending - she is on ceftriaxone   Disposition - would continue inpatient monitoring for now but hopeful to be ready for discharge in the next 1-2 days from a strictly renal standpoint    Estanislado Emms, MD 11/22/2022 11:34 AM

## 2022-11-22 NOTE — Progress Notes (Signed)
Mobility Specialist: Progress Note   11/22/22 1426  Mobility  Activity Ambulated with assistance to bathroom  Level of Assistance Minimal assist, patient does 75% or more  Assistive Device Front wheel walker  Distance Ambulated (ft) 24 ft (12'x2)  Activity Response Tolerated well  Mobility Referral Yes  $Mobility charge 1 Mobility   Post-Mobility: 64 HR  Pt received in the chair and agreeable to mobility. MinA to stand and contact guard during ambulation. Verbal cues for upright posture and RW proximity. Physical assist for RW management when getting in/out of the BR. Pt back to the chair after session with aid present in the room.   Audery Wassenaar Mobility Specialist Please contact via SecureChat or Rehab office at [phone]

## 2022-11-22 NOTE — Progress Notes (Signed)
Daily Progress Note   Patient Name: [name]       Date: 11/22/2022 DOB: [birth year]  Age: 89 y.o. MRN#: [id number] Attending Physician: Rodolph Bong, MD Primary Care Physician: Renford Dills, MD Admit Date: 11/15/2022 Length of Stay: 6 days  Reason for Consultation/Follow-up: Establishing goals of care  HPI/Patient Profile:  89 y.o. female  with past medical history of chronic diastolic CHF, CKD 4, emphysematous cystitis, uncontrolled hypertension, chronic pain, seizure disorder, gout. Patient was brought in by daughter secondary to complaints of poor p.o. intake as well as swelling in the leg. admitted on 11/15/2022 with acute on chronic diastolic CHF, acute hypoxia, acute renal failure superimposed on stage IV chronic kidney disease, dementia, severe pulmonary hypertension, and others.    PMT was consulted for GOC conversations.  Subjective:   Subjective: Chart Reviewed. Updates received. Patient Assessed. Created space and opportunity for patient  and family to explore thoughts and feelings regarding current medical situation.  Today's Discussion: Today met with the patient at the bedside, she was sitting in a bedside chair.  The patient's caregiver was at the bedside.  During my visit the patient appeared sleepy but interactive and appropriate.  She states she is doing okay today.  Denies any pain, nausea, vomiting.  We discussed the plan for continued time for outcomes.  Appears creatinine has plateaued around 3 and may be slightly decreasing.  The patient's daughter remains hopeful for some improvement to able to take the patient home.  Patient's daughter is not sure if she is ready for hospice at this point but the plan has been for rediscussing hospice if the patient has any significant decline or begins to suffer at any point.  We will continue with watchful waiting at this time.  I provided emotional and general support through therapeutic listening, empathy, sharing of stories,  therapeutic touch, and other techniques. I answered all questions and addressed all concerns to the best of my ability.  Review of Systems  Constitutional:  Positive for fatigue.       Denies pain in general  Respiratory:  Negative for shortness of breath.   Cardiovascular:  Negative for chest pain.  Gastrointestinal:  Negative for abdominal pain, nausea and vomiting.    Objective:   Vital Signs:  BP (!) 144/61 (BP Location: Left Arm)   Pulse 65   Temp (!) 97.4 F (36.3 C)   Resp 19   Ht 5\' 2"  (1.575 m)   Wt 76.5 kg   SpO2 94%   BMI 30.84 kg/m   Physical Exam: Physical Exam Vitals and nursing note reviewed.  Constitutional:      General: She is sleeping. She is not in acute distress. HENT:     Head: Normocephalic and atraumatic.  Cardiovascular:     Rate and Rhythm: Normal rate.  Pulmonary:     Effort: Pulmonary effort is normal. No respiratory distress.  Abdominal:     General: Abdomen is flat.     Palpations: Abdomen is soft.  Skin:    General: Skin is warm and dry.  Neurological:     Mental Status: She is easily aroused.  Psychiatric:        Mood and Affect: Mood normal.        Behavior: Behavior normal.     Palliative Assessment/Data: 60%    Existing Vynca/ACP Documentation: None  Assessment & Plan:   Impression: Present on Admission:  Acute on chronic diastolic CHF (congestive heart failure)  Acute renal failure  superimposed on stage 4 chronic kidney disease  Type 2 diabetes mellitus with complication, without long-term current use of insulin  Hypertension  GERD (gastroesophageal reflux disease)  Dementia  Severe pulmonary hypertension  Acute on chronic diastolic (congestive) heart failure  SUMMARY OF RECOMMENDATIONS   DNR Watchful waiting Time for outcomes PMT will continue to follow due to high risk for decompensation  Symptom Management:  Per primary team PMT is available to assist as needed  Code Status: DNR  Prognosis: Unable  to determine  Discharge Planning: To Be Determined  Discussed with: Patient, patient's caregiver, medical team, nursing team  Thank you for allowing [place] to participate in the care of BRYANNAH BOSTON PMT will continue to support holistically.  Billing based on MDM: High  Problems Addressed: One acute or chronic illness or injury that poses a threat to life or bodily function  Amount and/or Complexity of Data: Category 3:Discussion of management or test interpretation with external physician/other qualified health care professional/appropriate source (not separately reported)  Risks: N/A    Wynne Dust, NP Palliative Medicine Team  Team Phone # [phone] (Nights/Weekends)  03/28/2021, 8:17 AM

## 2022-11-22 NOTE — Progress Notes (Signed)
PROGRESS NOTE    [name]  ZOX:[id number] DOB: 1929-03-05 DOA: 11/15/2022 PCP: Renford Dills, MD    Chief Complaint  Patient presents with   Foot Swelling    Brief Narrative:  [name] is a 89 y.o. female with PMH significant of chronic diastolic CHF, CKD 4, emphysematous cystitis, uncontrolled hypertension, chronic pain, seizure disorder, gout. Patient was brought in by daughter secondary to complaints of poor p.o. intake as well as swelling in the leg. CXR with atelectasis vs infiltrates.  BNP >1900.  ECG old RBBB.  SpO2 88%. treated with IV diuresis. Developed AKI with serum creatinine trending from 1-2.3 at the time of admission to 3.03.  IV albumin ordered, patient placed on IV fluids and Lasix discontinued.    Assessment & Plan:   Principal Problem:   Acute on chronic diastolic CHF (congestive heart failure) Active Problems:   Acute renal failure superimposed on stage 4 chronic kidney disease   Type 2 diabetes mellitus with complication, without long-term current use of insulin   Hypertension   GERD (gastroesophageal reflux disease)   Dementia   History of femur fracture   Severe pulmonary hypertension   Acute on chronic diastolic (congestive) heart failure   AKI (acute kidney injury)  #1 acute on chronic diastolic CHF -Patient had presented with lower extremity swelling, poor oral intake, chest x-ray concerning for congestion versus atelectasis. -Patient currently afebrile.if infectious etiology. -Noted initially on presentation with sats of 88% however currently with sats of 95% on 2 L nasal cannula. -Patient noted initially on presentation to have been placed on IV Lasix however due to worsening renal function diuresis held and patient placed on gentle hydration. -2D echo obtained with a EF of 60 to 65%, grade 1 diastolic dysfunction, right ventricular overload with severe pulmonary hypertension and moderate tricuspid regurgitation. -It is noted that per family  patient with 141 pounds 2 weeks prior to admission and on day of admission was noted at 154 pounds. -Titrate O2. -Patient with concerns for volume overload and as such Lasix 40 mg IV x 1 ordered per nephrology. -Patient noted to be on Lasix 40 mg orally daily prior to admission. -Follow.  2.  Chest tightness -Resolved. -Likely secondary to problem #1. -EKG with no ischemic changes noted. -Minimally elevated troponin not consistent with ACS. -Continue Imdur at current dose.   3.  Acute renal failure on CKD stage IV -Baseline fattening noted approximately 1.7. -On admission patient noted to have a serum creatinine of 2.3 felt likely secondary to prerenal etiology secondary to acute CHF exacerbation. -Renal ultrasound negative for hydronephrosis. -Creatinine initially improved with diuresis however starting to trend back up and as such diuretics discontinued and patient placed on gentle hydration and treated with IV albumin. -Creatinine slowly trending back down currently at 2.89 this morning from as high as 3.03 (11/20/2022). -Urine sodium noted at 10, urine creatinine at 141. -Due to worsening renal function consulted nephrology consulted, diuretics and fluids held yesterday, patient with concerns for volume overload today and as such patient to receive a dose of IV Lasix per nephrology.   -Nephrology following and appreciate input and recommendations.    4.  Epigastric tenderness -Resolved. -Lipase within normal limits.  LFTs within normal limits.  Abdominal films unremarkable.  5.  Type 2 diabetes mellitus without long-term insulin use -Hemoglobin A1c 6.1 (11/15/2022). -CBG noted at 118 this morning.   -Follow.  6.  Hypertensive urgency -Patient noted to have had significantly elevated blood pressures per  prior physician and blood pressure better controlled on regimen of Norvasc 10 mg daily, metoprolol 100 mg twice daily, hydralazine 50 mg every 8 hours, Imdur 30 mg daily. -Systolic  blood pressures in the 120s on 11/21/2022, patient 89 years of age with history of CKD stage IV. -Norvasc has been decreased to 2.5 mg daily, hydralazine decreased to 25 mg p.o. every 8 hours, Imdur decreased to 15 mg daily, Lopressor decreased to 50 mg twice daily.   -BP currently stable.   -Follow-up.  7.  GERD -Continue PPI.    8.  Dementia -Mental status seems somewhat stable.  No behavioral issues. -Continue Seroquel. -It is noted per prior physician that daughter holds medicine once patient is more sleepy and only uses it to help sleep at night. -Currently on Seroquel 12.5 mg nightly which we will change to as needed.   -Continue Remeron. -Patient started on Marinol as appetite stimulant per primary physician.  9.  History of seizures -Stable.   -Continue Keppra.   10.  Chronic pain -Continue current regimen of gabapentin dose adjusted due to AKI.  11.  Recurrent UTI -Patient noted to have started on Macrobid recently, due to worsening renal function felt not a good candidate to continue Macrobid and it was discontinued. -Urinalysis done on admission with moderate leukocytes, nitrite negative, many bacteria, WBCs > 50.  -Repeat UA with large leukocytes, nitrite negative, many bacteria, WBC 21-50.   -Urine cultures pending.   -Continue IV Rocephin pending culture results.  12.  Severe pulmonary hypertension -Patient has not seen a cardiologist on a regular basis. -Due to patient's age prognosis very poor. -Supportive care. -Palliative care consulted and following.  13.  Goals of care discussion -Palliative care consulted who has seen patient patient currently made a DNR. -Dr. Allena Katz discussed with patient's daughter that if renal function improves and shortness of breath improves and does not worsen patient may be discharged home soon however if condition worsens may need to consider transitioning to comfort measures. -    DVT prophylaxis: Heparin Code Status:  DNR Family Communication: Updated patient and friend/caretaker at bedside. Disposition: TBD  Status is: Inpatient Remains inpatient appropriate because: Severity of illness   Consultants:  Palliative care: Dr. Patterson Hammersmith 11/20/2022 Nephrology: Dr. Malen Gauze 11/21/2022  Procedures:  Chest x-ray 11/15/2022 Plain films of the abdomen 11/15/2022 Renal ultrasound 11/15/2022 2D echo 11/16/2022   Antimicrobials:  Anti-infectives (From admission, onward)    Start     Dose/Rate Route Frequency Ordered Stop   11/21/22 2000  cefTRIAXone (ROCEPHIN) 2 g in sodium chloride 0.9 % 100 mL IVPB        2 g 200 mL/hr over 30 Minutes Intravenous Every 24 hours 11/21/22 1920     11/15/22 1900  nitrofurantoin (macrocrystal-monohydrate) (MACROBID) capsule 100 mg  Status:  Discontinued        100 mg Oral Daily 11/15/22 1802 11/15/22 1832         Subjective: Patient sitting up in chair.  Family at bedside.  States some improvement with shortness of breath.  Denies any chest pain.  No abdominal pain.  Poor oral intake.  Caretaker/friend at bedside.    Objective: Vitals:   11/22/22 0416 11/22/22 0713 11/22/22 0735 11/22/22 1053  BP: (!) 146/57 (!) 134/55  (!) 144/61  Pulse:  64  65  Resp: Temp: 98.1 F (36.7 C) (!) 97.4 F (36.3 C)  (!) 97.4 F (36.3 C)  TempSrc: Oral  SpO2: 96% 99% 95% 94%  Weight: 76.5 kg     Height:        Intake/Output Summary (Last 24 hours) at 11/22/2022 1200 Last data filed at 11/21/2022 2200 Gross per 24 hour  Intake 220 ml  Output 50 ml  Net 170 ml    Filed Weights   11/20/22 0500 11/21/22 0425 11/22/22 0416  Weight: 73.3 kg 75.7 kg 76.5 kg    Examination:  General exam: NAD. Respiratory system: Shallow breaths.  Some diffuse crackles.  No wheezing.  Fair air movement.  Cardiovascular system: RRR no murmurs rubs or gallops.  1+ bilateral lower extremity edema.  Gastrointestinal system: Abdomen soft, nontender, nondistended, positive bowel sounds.  No  rebound.  No guarding. Central nervous system: Alert and oriented. No focal neurological deficits. Extremities: 1+ bilateral lower extremity edema.   Skin: No rashes, lesions or ulcers Psychiatry: Judgement and insight appear fair. . Mood & affect appropriate.     Data Reviewed: I have personally reviewed following labs and imaging studies  CBC: Recent Labs  Lab 11/17/22 0058 11/18/22 0100 11/19/22 0053 11/21/22 0855 11/22/22 0052  WBC 4.7 3.7* 3.9* 3.5* 3.7*  NEUTROABS  --   --   --  2.1 2.0  HGB 9.4* 9.2* 9.2* 9.3* 9.4*  HCT 28.7* 27.3* 29.1* 28.4* 29.5*  MCV 96.0 95.5 98.0 96.9 98.7  PLT 207 197 198 172 171     Basic Metabolic Panel: Recent Labs  Lab 11/17/22 0058 11/18/22 0100 11/19/22 0053 11/20/22 0745 11/21/22 0855 11/22/22 0052  NA 133* 135 133* 130* 134* 133*  K 3.6 3.6 3.9 4.0 4.0 4.0  CL 104 105 103 99 106 105  CO2 21* 22 22 21* 21* 20*  GLUCOSE 181* 104* 120* 99 114* 132*  BUN 41* 43* 45* 48* 50* 52*  CREATININE 2.45* 2.75* 2.84* 3.03* 2.94* 2.89*  CALCIUM 8.5* 8.6* 8.8* 9.0 8.7* 8.8*  MG 2.1 2.0 2.1 1.9 1.8  --   PHOS  --   --   --  4.8* 4.9* 4.7*     GFR: Estimated Creatinine Clearance: 12.1 mL/min (A) (by C-G formula based on SCr of 2.89 mg/dL (H)).  Liver Function Tests: Recent Labs  Lab 11/15/22 2039 11/16/22 0103 11/20/22 0745 11/21/22 0855 11/22/22 0052  AST 36 38  --   --   --   ALT 37 33  --   --   --   ALKPHOS 178* 167*  --   --   --   BILITOT 0.4 0.4  --   --   --   PROT 6.4* 6.0*  --   --   --   ALBUMIN 3.1* 2.8* 2.9* 2.8* 2.8*     CBG: Recent Labs  Lab 11/15/22 2105 11/16/22 0616 11/16/22 2021 11/21/22 2256 11/22/22 0603  GLUCAP 133* 93 113* 157* 118*      No results found for this or any previous visit (from the past 240 hour(s)).       Radiology Studies: No results found.      Scheduled Meds:  acetaminophen  500 mg Oral BID   amLODipine  2.5 mg Oral Daily   dronabinol  2.5 mg Oral BID AC    gabapentin  100 mg Oral QHS   heparin  5,000 Units Subcutaneous Q8H   hydrALAZINE  25 mg Oral Q8H   iron polysaccharides  150 mg Oral Daily   isosorbide mononitrate  15 mg Oral Daily   levETIRAcetam  500 mg Oral  BID   metoprolol tartrate  50 mg Oral BID   mirtazapine  15 mg Oral QHS   pantoprazole  40 mg Oral Daily   QUEtiapine  12.5 mg Oral QHS   sodium chloride flush  3 mL Intravenous Q12H   Continuous Infusions:  cefTRIAXone (ROCEPHIN)  IV Stopped (11/21/22 2122)     LOS: 6 days    Time spent: 35 minutes    Ramiro Harvest, MD [facility]   To contact the attending provider between 7A-7P or the covering provider during after hours 7P-7A, please log into the web site [email or web address] and access using universal [place] password for that web site. If you do not have the password, please call the hospital operator.  11/22/2022, 12:00 PM

## 2022-11-22 NOTE — Progress Notes (Addendum)
Concurred with prior assessment.  Offered morning hydralazine to patient; patient is confused, states "I don't take medicine like that". Suggest reattempting under family supervision.

## 2022-11-23 DIAGNOSIS — I5033 Acute on chronic diastolic (congestive) heart failure: Secondary | ICD-10-CM | POA: Diagnosis not present

## 2022-11-23 LAB — CBC
HCT: 30.2 % — ABNORMAL LOW (ref 36.0–46.0)
Hemoglobin: 10 g/dL — ABNORMAL LOW (ref 12.0–15.0)
MCH: 31.6 pg (ref 26.0–34.0)
MCHC: 33.1 g/dL (ref 30.0–36.0)
MCV: 95.6 fL (ref 80.0–100.0)
Platelets: 182 10*3/uL (ref 150–400)
RBC: 3.16 MIL/uL — ABNORMAL LOW (ref 3.87–5.11)
RDW: 15.2 % (ref 11.5–15.5)
WBC: 4 10*3/uL (ref 4.0–10.5)
nRBC: 0 % (ref 0.0–0.2)

## 2022-11-23 LAB — RENAL FUNCTION PANEL
Albumin: 2.8 g/dL — ABNORMAL LOW (ref 3.5–5.0)
Anion gap: 11 (ref 5–15)
BUN: 50 mg/dL — ABNORMAL HIGH (ref 8–23)
CO2: 20 mmol/L — ABNORMAL LOW (ref 22–32)
Calcium: 9.2 mg/dL (ref 8.9–10.3)
Chloride: 105 mmol/L (ref 98–111)
Creatinine, Ser: 2.55 mg/dL — ABNORMAL HIGH (ref 0.44–1.00)
GFR, Estimated: 17 mL/min — ABNORMAL LOW (ref >60)
Glucose, Bld: 141 mg/dL — ABNORMAL HIGH (ref 70–99)
Phosphorus: 3.9 mg/dL (ref 2.5–4.6)
Potassium: 4 mmol/L (ref 3.5–5.1)
Sodium: 136 mmol/L (ref 135–145)

## 2022-11-23 LAB — GLUCOSE, CAPILLARY
Glucose-Capillary: 116 mg/dL — ABNORMAL HIGH (ref 70–99)
Glucose-Capillary: 115 mg/dL — ABNORMAL HIGH (ref 70–99)
Glucose-Capillary: 131 mg/dL — ABNORMAL HIGH (ref 70–99)
Glucose-Capillary: 192 mg/dL — ABNORMAL HIGH (ref 70–99)

## 2022-11-23 MED ORDER — SODIUM CHLORIDE 0.9 % IV SOLN
2.0000 g | INTRAVENOUS | Status: AC
  Administered 2022-11-23: 2 g via INTRAVENOUS
  Filled 2022-11-23: qty 20

## 2022-11-23 MED ORDER — FUROSEMIDE 40 MG PO TABS
40.0000 mg | ORAL_TABLET | Freq: Every day | ORAL | Status: DC
  Administered 2022-11-24 – 2022-11-25 (×2): 40 mg via ORAL
  Filled 2022-11-23 (×2): qty 1

## 2022-11-23 MED ORDER — FUROSEMIDE 10 MG/ML IJ SOLN
40.0000 mg | Freq: Once | INTRAMUSCULAR | Status: AC
  Administered 2022-11-23: 40 mg via INTRAVENOUS
  Filled 2022-11-23: qty 4

## 2022-11-23 NOTE — Progress Notes (Signed)
PROGRESS NOTE    [name]  ZOX:[id number] DOB: 02/19/1929 DOA: 11/15/2022 PCP: Renford Dills, MD    Chief Complaint  Patient presents with   Foot Swelling    Brief Narrative:  [name] is a 89 y.o. female with PMH significant of chronic diastolic CHF, RV failure, severe pulmonary hypertension, dementia, CKD 4, emphysematous cystitis, uncontrolled hypertension, chronic pain, seizure disorder, gout. Patient was brought in by daughter secondary to complaints of poor p.o. intake as well as swelling in the leg. CXR with atelectasis vs infiltrates.  BNP >1900.  ECG old RBBB.  SpO2 88%. treated with IV diuresis. Developed AKI with serum creatinine trending from 1-2.3 at the time of admission to 3.03.  IV albumin ordered, patient placed on IV fluids and Lasix discontinued.    Assessment & Plan:   Acute on chronic diastolic CHF RV failure, severe pulmonary hypertension -2D echo  EF of 60 to 65%, grade 1 diastolic dysfunction, right ventricular overload, moderate RV failure with severe pulmonary hypertension and moderate tricuspid regurgitation. -13 pound weight gain to 154 at the time of admission  -Volume overload complicated by AKI on CKD 4 -Has been diuresed with IV Lasix intermittently, nephrology following, transition to oral Lasix from tomorrow -Conservative management of pulmonary hypertension on account of advanced age and frailty, -Palliative team following   Acute renal failure on CKD stage IV -Baseline fattening noted approximately 1.7. -Creatinine 2.3 on admission, peaked at 3, nephrology following, diuretics were held temporarily, treated with IV albumin as well  -Back on IV Lasix, resume oral diuretics tomorrow  Epigastric tenderness -Resolved. -Lipase within normal limits.  LFTs within normal limits.  Abdominal films unremarkable.  Type 2 diabetes mellitus without long-term insulin use -Hemoglobin A1c 6.1 (11/15/2022). -Stable  Hypertensive urgency -Now stable,  continue Norvasc, metoprolol, Imdur and hydralazine   Dementia -Stable, continue Seroquel, Remeron   History of seizures -Stable.   -Continue Keppra.   Chronic pain -Continue current regimen of gabapentin dose adjusted due to AKI.  Recurrent UTI -Patient noted to have started on Macrobid recently, due to worsening renal function this was discontinued -Urinalysis done on admission with moderate leukocytes, nitrite negative, many bacteria, WBCs > 50.  -Repeat UA with large leukocytes, nitrite negative, many bacteria, WBC 21-50.   -Has been on IV ceftriaxone, day 3 today, urine culture with lactobacillus, could be stool contaminant, DC antibiotics after today's dose  Goals of care discussion -Palliative care consulted, DNR, further discussions ongoing   DVT prophylaxis: Heparin Code Status: DNR Family Communication: Updated patient and friend/caretaker at bedside. Disposition: TBD  Status is: Inpatient Remains inpatient appropriate because: Severity of illness   Consultants:  Palliative care: Dr. Patterson Hammersmith 11/20/2022 Nephrology: Dr. Malen Gauze 11/21/2022  Procedures:  Chest x-ray 11/15/2022 Plain films of the abdomen 11/15/2022 Renal ultrasound 11/15/2022 2D echo 11/16/2022   Antimicrobials:  Anti-infectives (From admission, onward)    Start     Dose/Rate Route Frequency Ordered Stop   11/21/22 2000  cefTRIAXone (ROCEPHIN) 2 g in sodium chloride 0.9 % 100 mL IVPB        2 g 200 mL/hr over 30 Minutes Intravenous Every 24 hours 11/21/22 1920     11/15/22 1900  nitrofurantoin (macrocrystal-monohydrate) (MACROBID) capsule 100 mg  Status:  Discontinued        100 mg Oral Daily 11/15/22 1802 11/15/22 1832         Subjective: -Sitting up in bed this morning, denies any complaints, caretaker at bedside   Objective: Vitals:  11/23/22 0452 11/23/22 0659 11/23/22 0756 11/23/22 1122  BP: (!) 160/86 (!) 166/67 (!) 171/67 (!) 156/54  Pulse: 71  69 64  Resp:   18 18  Temp: 97.6 F  (36.4 C)  97.6 F (36.4 C) (!) 97.5 F (36.4 C)  TempSrc: Oral   Oral  SpO2: 94%  91%   Weight: 70.4 kg     Height:        Intake/Output Summary (Last 24 hours) at 11/23/2022 1134 Last data filed at 11/23/2022 0455 Gross per 24 hour  Intake --  Output 1500 ml  Net -1500 ml   Filed Weights   11/21/22 0425 11/22/22 0416 11/23/22 0452  Weight: 75.7 kg 76.5 kg 70.4 kg    Examination:  Frail elderly chronically ill female laying in bed, awake alert, oriented to self and place, mild cognitive deficits CVS: S1-S2, regular rhythm Lungs: Poor air movement bilaterally, decreased in the bases Abdomen: Soft, nontender, bowel sounds present Extremities: 1+ edema  Psychiatry: Mood & affect appropriate.     Data Reviewed: I have personally reviewed following labs and imaging studies  CBC: Recent Labs  Lab 11/18/22 0100 11/19/22 0053 11/21/22 0855 11/22/22 0052 11/23/22 0058  WBC 3.7* 3.9* 3.5* 3.7* 4.0  NEUTROABS  --   --  2.1 2.0  --   HGB 9.2* 9.2* 9.3* 9.4* 10.0*  HCT 27.3* 29.1* 28.4* 29.5* 30.2*  MCV 95.5 98.0 96.9 98.7 95.6  PLT 197 198 172 171 182    Basic Metabolic Panel: Recent Labs  Lab 11/17/22 0058 11/18/22 0100 11/19/22 0053 11/20/22 0745 11/21/22 0855 11/22/22 0052 11/23/22 0058  NA 133* 135 133* 130* 134* 133* 136  K 3.6 3.6 3.9 4.0 4.0 4.0 4.0  CL 104 105 103 99 106 105 105  CO2 21* 22 22 21* 21* 20* 20*  GLUCOSE 181* 104* 120* 99 114* 132* 141*  BUN 41* 43* 45* 48* 50* 52* 50*  CREATININE 2.45* 2.75* 2.84* 3.03* 2.94* 2.89* 2.55*  CALCIUM 8.5* 8.6* 8.8* 9.0 8.7* 8.8* 9.2  MG 2.1 2.0 2.1 1.9 1.8  --   --   PHOS  --   --   --  4.8* 4.9* 4.7* 3.9    GFR: Estimated Creatinine Clearance: 13.2 mL/min (A) (by C-G formula based on SCr of 2.55 mg/dL (H)).  Liver Function Tests: Recent Labs  Lab 11/20/22 0745 11/21/22 0855 11/22/22 0052 11/23/22 0058  ALBUMIN 2.9* 2.8* 2.8* 2.8*    CBG: Recent Labs  Lab 11/22/22 1116 11/22/22 1552  11/22/22 2101 11/23/22 0609 11/23/22 1126  GLUCAP 113* 138* 143* 116* 115*     Recent Results (from the past 240 hour(s))  Urine Culture (for pregnant, neutropenic or urologic patients or patients with an indwelling urinary catheter)     Status: Abnormal   Collection Time: 11/21/22 12:50 PM   Specimen: Urine, Catheterized  Result Value Ref Range Status   Specimen Description URINE, CATHETERIZED  Final   Special Requests NONE  Final   Culture (A)  Final    >=100,000 COLONIES/mL LACTOBACILLUS SPECIES Standardized susceptibility testing for this organism is not available. Performed at [facility], [address], [place], [place] [address]    Report Status 11/22/2022 FINAL  Final    Scheduled Meds:  acetaminophen  500 mg Oral BID   amLODipine  2.5 mg Oral Daily   dronabinol  2.5 mg Oral BID AC   furosemide  40 mg Intravenous Once   [START ON 11/24/2022] furosemide  40 mg Oral Daily   gabapentin  100 mg Oral QHS   heparin  5,000 Units Subcutaneous Q8H   hydrALAZINE  25 mg Oral Q8H   iron polysaccharides  150 mg Oral Daily   isosorbide mononitrate  15 mg Oral Daily   levETIRAcetam  500 mg Oral BID   metoprolol tartrate  50 mg Oral BID   mirtazapine  15 mg Oral QHS   pantoprazole  40 mg Oral Daily   QUEtiapine  12.5 mg Oral QHS   sodium chloride flush  3 mL Intravenous Q12H   Continuous Infusions:  cefTRIAXone (ROCEPHIN)  IV 2 g (11/22/22 2049)     LOS: 7 days    Time spent: 35 minutes    Zannie Cove, MD [facility]   11/23/2022, 11:34 AM

## 2022-11-23 NOTE — Progress Notes (Signed)
Physical Therapy Treatment Patient Details Name: [name] MRN: [id number] DOB: 1928-10-14 Today's Date: 11/23/2022   History of Present Illness Pt is a 89 y.o. female admitted 4/18 with acute on chronic diastolic heart failure. PMH: dementia, DM, GERD, glaucoma, CHF, HTN, h/o fall with distal femur fx s/o ORIF (Dec 2023).    PT Comments    Pt with fair tolerance to treatment today. Pt initially refused mobility but was agreeable to seated therex in recliner. After seated therex, pt was able to ambulate short distance in room with RW Min A and max encouragement provided by caregiver. No change in DC/DME recs at this time. PT will continue to follow.    Recommendations for follow up therapy are one component of a multi-disciplinary discharge planning process, led by the attending physician.  Recommendations may be updated based on patient status, additional functional criteria and insurance authorization.  Follow Up Recommendations       Assistance Recommended at Discharge Frequent or constant Supervision/Assistance  Patient can return home with the following A little help with walking and/or transfers;A lot of help with bathing/dressing/bathroom;Assistance with cooking/housework;Assist for transportation;Help with stairs or ramp for entrance   Equipment Recommendations  None recommended by PT    Recommendations for Other Services       Precautions / Restrictions Precautions Precautions: Fall;Other (comment) Precaution Comments: watch sats (tends to desat when resting) Restrictions Weight Bearing Restrictions: No     Mobility  Bed Mobility               General bed mobility comments: pt received in recliner and wanting to remain in chair at end of session    Transfers Overall transfer level: Needs assistance Equipment used: Rolling walker (2 wheels) Transfers: Sit to/from Stand Sit to Stand: Min assist           General transfer comment: Cues for hand  placement. Caregiver present and provided encouragement. Pt able to stand after rocking    Ambulation/Gait Ambulation/Gait assistance: Min assist Gait Distance (Feet): 15 Feet Assistive device: Rolling walker (2 wheels) Gait Pattern/deviations: Step-through pattern, Decreased step length - right, Decreased step length - left, Trendelenburg, Trunk flexed Gait velocity: decr     General Gait Details: Cues for upright posture and obstacle navigation.   Stairs             Wheelchair Mobility    Modified Rankin (Stroke Patients Only)       Balance Overall balance assessment: Needs assistance Sitting-balance support: No upper extremity supported, Feet supported Sitting balance-Leahy Scale: Fair Sitting balance - Comments: Pt seated in recliner   Standing balance support: Bilateral upper extremity supported, During functional activity Standing balance-Leahy Scale: Poor Standing balance comment: Reliant on RW                            Cognition Arousal/Alertness: Lethargic Behavior During Therapy: Flat affect Overall Cognitive Status: History of cognitive impairments - at baseline                                 General Comments: dementia at baseline, pt following simple commands with increased time ~75% of the time. Pt needs multimodal cues for some commands.        Exercises General Exercises - Lower Extremity Ankle Circles/Pumps: AROM, Both, 10 reps, Seated Long Arc Quad: AROM, Both, 10 reps, Seated Hip Flexion/Marching: AROM,  Both, 5 reps, Seated    General Comments General comments (skin integrity, edema, etc.): VSS on RA. Caregiver present and provided max encouragement.      Pertinent Vitals/Pain Pain Assessment Pain Assessment: No/denies pain    Home Living                          Prior Function            PT Goals (current goals can now be found in the care plan section) Progress towards PT goals:  Progressing toward goals    Frequency    Min 1X/week      PT Plan Current plan remains appropriate    Co-evaluation              AM-PAC PT "6 Clicks" Mobility   Outcome Measure  Help needed turning from your back to your side while in a flat bed without using bedrails?: A Little Help needed moving from lying on your back to sitting on the side of a flat bed without using bedrails?: A Lot Help needed moving to and from a bed to a chair (including a wheelchair)?: A Little Help needed standing up from a chair using your arms (e.g., wheelchair or bedside chair)?: A Little Help needed to walk in hospital room?: A Little Help needed climbing 3-5 steps with a railing? : A Lot 6 Click Score: 16    End of Session Equipment Utilized During Treatment: Gait belt Activity Tolerance: Patient limited by lethargy Patient left: in chair;with call bell/phone within reach;with family/visitor present Nurse Communication: Mobility status PT Visit Diagnosis: Muscle weakness (generalized) (M62.81)     Time: 4098-1191 PT Time Calculation (min) (ACUTE ONLY): 18 min  Charges:  $Therapeutic Exercise: 8-22 mins                     Shela Nevin, PT, DPT Acute Rehab Services [phone]    [name] 11/23/2022, 3:45 PM

## 2022-11-23 NOTE — Progress Notes (Signed)
     Referral previously received for [name] for goals of care discussion. Chart reviewed and updates received from RN.    I received a call from the patient's daughter [name].  We discussed the current plan earlier discussion with hospitalist.  They feel that she will be ready to discharge home in 1 to 2 days.  We discussed her creatinine is stabilized and is actually continuing to improve.  Her daughter states that she will bring her home.  We discussed plan for outpatient palliative care although she is in agreement.  Order placed with [facility] for outpatient palliative care referral.  Palliative medicine will back off for now. Please let us know if there are any significant clinical changes or new palliative needs.  Thank you for your referral and allowing PMT to assist in [name] care.   Wynne Dust, NP Palliative Medicine Team Phone: [phone]  NO CHARGE

## 2022-11-23 NOTE — Progress Notes (Signed)
[facility] Progress Note  Name: [name] MRN: [id number] DOB: [birth year]  Chief Complaint:  Lower extremity swelling and not eating well   Subjective:  She had 1.5 L uop charted over 4/25.  She got lasix 40 mg IV once on 4/25.  Post void residual bladder scan was checked 4/25 and only 3mL.   I spoke with her daughter via phone outside of the patient's room at her request.  She does not have a kidney doctor and would be happy for [name] to follow her as an outpatient.  She has a caretaker at bedside.    Review of systems:  She denies overt shortness of breath Denies n/v Denies chest pain    -------------- Background: [name] is a 89 y.o. female with a history of chronic diastolic CHF, dementia, CKD stage IV, diabetes mellitus, and hypertension who presented to the hospital with poor intake and leg swelling.  Her daughter is her caregiver and reported compliance with lasix 40 mg daily.  She was found to be hypoxic to 88% and got lasix IV on 4/18 and 4/19.  This was then held and she was hydrated with normal saline after noted Cr rise.  Creatinine trends are below.  Cr 2.31 on 4/18 and peak of 3.03 on 4/23.  Repeat Creatinine 2.94 today.  Today, her blood pressure medications were reduced.  Nephrology is consulted for assistance with management of AKI on CKD stage IV.  Note AKI event in 06/2022 as below peak Cr 5.93. Past baseline Cr appears to be near 2.  She had 400 mL uop over 4/23 charted and uop thus far today.  She has been on 2 liters oxygen.  She had a renal [name] earlier this admission with normal echogenicity and no hydro.  I spoke with the patient and her daughter at bedside.  Her daughter is an excellent historian.  She has compression devices at home that they use on her legs to help with fluid.  She hasn't been eating great and doesn't like much of the food on her plate tonight.  She does like salt in her food.  She has had AKI events in the past year as noted and  some health setbacks.  However, prior to November, this patient was walking without an assist device and would sometimes wear high heels.  She is still fairly independent with many ADL's at home per her daughter.  Her blood pressure is normally not well-controlled.  Her daughter states that after a prior admission she required a foley.  She is now continent and she reports that they did a bladder scan that was acceptable a few days ago.  (Per charting 39 mL on 4/21).  Her daughter states that she is a little more confused than normal - normally she would be able to look at the calendar on the wall and reorient herself and tell me the year.    Intake/Output Summary (Last 24 hours) at 11/23/2022 1037 Last data filed at 11/23/2022 0455 Gross per 24 hour  Intake --  Output 1500 ml  Net -1500 ml    Vitals:  Vitals:   11/22/22 2347 11/23/22 0452 11/23/22 0659 11/23/22 0756  BP: (!) 148/76 (!) 160/86 (!) 166/67 (!) 171/67  Pulse: 69 71  69  Resp: 17   18  Temp: 98.2 F (36.8 C) 97.6 F (36.4 C)  97.6 F (36.4 C)  TempSrc: Oral Oral    SpO2: 94% 94%  91%  Weight:  70.4 kg    Height:         Physical Exam:  General elderly female in bed in no acute distress HEENT normocephalic atraumatic extraocular movements intact sclera anicteric Neck supple trachea midline Lungs basilar crackles on auscultation bilaterally normal work of breathing at rest on room air  Heart s1S2 no rub Abdomen soft nontender nondistended Extremities 1+ edema lower legs  Psych normal mood and affect Neuro alert and oriented to person and location; eyes closed but opens them when answering questions; conversant and pleasant   Medications reviewed   Labs:     Latest Ref Rng & Units 11/23/2022   12:58 AM 11/22/2022   12:52 AM 11/21/2022    8:55 AM  BMP  Glucose 70 - 99 mg/dL 604  540  981   BUN 8 - 23 mg/dL 50  52  50   Creatinine 0.44 - 1.00 mg/dL 1.91  4.78  2.95   Sodium 135 - 145 mmol/L 136  133  134    Potassium 3.5 - 5.1 mmol/L 4.0  4.0  4.0   Chloride 98 - 111 mmol/L 105  105  106   CO2 22 - 32 mmol/L 20  20  21    Calcium 8.9 - 10.3 mg/dL 9.2  8.8  8.7      Assessment/Plan:   # AKI  - Pre-renal insults and compounded by normotensive ATN.  Team has reduced her anti-hypertensives.  Her daughter states that her blood pressure is not normally well-controlled.  She has a history of prior retention but wasn't retaining on most recent check 4/25 - Cr is now downtrending - continue supportive care    # CKD stage IV  - Baseline Cr near 2    # Chronic diastolic CHF  - apply SCD's and TED hose to assist with compression and fluid removal - Lasix 40 mg IV once today  - per charting home med is lasix 40 mg PO daily.  Ok to resume this dose tomorrow - I will go ahead and write orders   # HTN  - Note reduction in her prior regimen and agree - she is not usually aggressively controlled so normotensive ATN may be etiology of her renal failure    # Dementia  - she is typically fairly functional  - appreciate staff and family supplementing her history    # Anemia of CKD  - added oral iron.  Can continue if tolerated    # Possible UTI  - she is on ceftriaxone per primary    Disposition - She is stable from a strictly renal standpoint.  Nephrology will sign off.  I will set up a follow-up appointment with me or an extender in 1-2 weeks after discharge  Thank you for the consult.  Please do not hesitate to contact me with any questions regarding our patient   Estanislado Emms, MD 11/23/2022 11:09 AM]

## 2022-11-24 DIAGNOSIS — I5033 Acute on chronic diastolic (congestive) heart failure: Secondary | ICD-10-CM | POA: Diagnosis not present

## 2022-11-24 LAB — CBC
HCT: 29 % — ABNORMAL LOW (ref 36.0–46.0)
Hemoglobin: 9.3 g/dL — ABNORMAL LOW (ref 12.0–15.0)
MCH: 30.9 pg (ref 26.0–34.0)
MCHC: 32.1 g/dL (ref 30.0–36.0)
MCV: 96.3 fL (ref 80.0–100.0)
Platelets: 180 10*3/uL (ref 150–400)
RBC: 3.01 MIL/uL — ABNORMAL LOW (ref 3.87–5.11)
RDW: 15.1 % (ref 11.5–15.5)
WBC: 4.5 10*3/uL (ref 4.0–10.5)
nRBC: 0 % (ref 0.0–0.2)

## 2022-11-24 LAB — GLUCOSE, CAPILLARY
Glucose-Capillary: 102 mg/dL — ABNORMAL HIGH (ref 70–99)
Glucose-Capillary: 122 mg/dL — ABNORMAL HIGH (ref 70–99)
Glucose-Capillary: 134 mg/dL — ABNORMAL HIGH (ref 70–99)
Glucose-Capillary: 113 mg/dL — ABNORMAL HIGH (ref 70–99)

## 2022-11-24 LAB — BASIC METABOLIC PANEL
Anion gap: 7 (ref 5–15)
BUN: 42 mg/dL — ABNORMAL HIGH (ref 8–23)
CO2: 23 mmol/L (ref 22–32)
Calcium: 9.2 mg/dL (ref 8.9–10.3)
Chloride: 108 mmol/L (ref 98–111)
Creatinine, Ser: 1.99 mg/dL — ABNORMAL HIGH (ref 0.44–1.00)
GFR, Estimated: 23 mL/min — ABNORMAL LOW (ref >60)
Glucose, Bld: 136 mg/dL — ABNORMAL HIGH (ref 70–99)
Potassium: 3.7 mmol/L (ref 3.5–5.1)
Sodium: 138 mmol/L (ref 135–145)

## 2022-11-24 MED ORDER — DIPHENHYDRAMINE HCL 25 MG PO CAPS: 25.0000 mg | ORAL_CAPSULE | Freq: Four times a day (QID) | ORAL | Status: DC | PRN

## 2022-11-24 NOTE — Progress Notes (Signed)
PROGRESS NOTE    [name]  OZH:[id number] DOB: 21-May-1929 DOA: 11/15/2022 PCP: Renford Dills, MD    Chief Complaint  Patient presents with   Foot Swelling    Brief Narrative:  [name] is a 89 y.o. female with PMH significant of chronic diastolic CHF, RV failure, severe pulmonary hypertension, dementia, CKD 4, emphysematous cystitis, uncontrolled hypertension, chronic pain, seizure disorder, gout. Patient was brought in by daughter secondary to complaints of poor p.o. intake as well as swelling in the leg. CXR with atelectasis vs infiltrates.  BNP >1900.  ECG old RBBB.  SpO2 88%. treated with IV diuresis. Developed AKI with serum creatinine trending from 1-2.3 at the time of admission to 3.03.   Subjective -Does not feel well enough to go home today, reports that she just does not feel good, cannot explain exactly why, reports that her breathing is better, denies any nausea vomiting or abdominal issues  Assessment & Plan:   Acute on chronic diastolic CHF RV failure, severe pulmonary hypertension -2D echo  EF of 60 to 65%, grade 1 diastolic dysfunction, right ventricular overload, moderate RV failure with severe pulmonary hypertension and moderate tricuspid regurgitation. -13 pound weight gain to 154 at the time of admission  -Volume overload complicated by AKI on CKD 4 -Has been diuresed with IV Lasix intermittently, seen by the allergy this admission, now transitioned to oral Lasix -Conservative management of pulmonary hypertension on account of advanced age and frailty, -Palliative team following, plan for discharge home with outpatient palliative care   Acute renal failure on CKD stage IV -Baseline fattening noted approximately 1.7. -Creatinine 2.3 on admission, peaked at 3, nephrology following, diuretics were held temporarily, treated with IV albumin as well  -Creatinine improving, continue oral Lasix  Epigastric tenderness -Resolved. -Lipase within normal limits.   LFTs within normal limits.  Abdominal films unremarkable.  Type 2 diabetes mellitus without long-term insulin use -Hemoglobin A1c 6.1 (11/15/2022). -Stable  Hypertensive urgency -Now stable, continue Norvasc, metoprolol, Imdur and hydralazine   Dementia -Stable, continue Seroquel, Remeron   History of seizures -Stable.   -Continue Keppra.   Chronic pain -Continue current regimen of gabapentin dose adjusted due to AKI.  Recurrent UTI -Patient noted to have started on Macrobid recently, due to worsening renal function this was discontinued -Urinalysis done on admission with moderate leukocytes, nitrite negative, many bacteria, WBCs > 50.  -Repeat UA with large leukocytes, nitrite negative, many bacteria, WBC 21-50.   -Has been on IV ceftriaxone, completed 3 days of IV set Raxone, urine culture with lactobacillus, could be stool contaminant, antibiotics discontinued  Goals of care discussion -Palliative care consulted, DNR, further discussions ongoing   DVT prophylaxis: Heparin Code Status: DNR Family Communication: Updated patient and friend/caretaker at bedside. Disposition: TBD  Status is: Inpatient Remains inpatient appropriate because: Severity of illness   Consultants:  Palliative care: Dr. Patterson Hammersmith 11/20/2022 Nephrology: Dr. Malen Gauze 11/21/2022  Procedures:  Chest x-ray 11/15/2022 Plain films of the abdomen 11/15/2022 Renal ultrasound 11/15/2022 2D echo 11/16/2022   Antimicrobials:  Anti-infectives (From admission, onward)    Start     Dose/Rate Route Frequency Ordered Stop   11/23/22 2000  cefTRIAXone (ROCEPHIN) 2 g in sodium chloride 0.9 % 100 mL IVPB        2 g 200 mL/hr over 30 Minutes Intravenous Every 24 hours 11/23/22 1143 11/23/22 2136   11/21/22 2000  cefTRIAXone (ROCEPHIN) 2 g in sodium chloride 0.9 % 100 mL IVPB  Status:  Discontinued  2 g 200 mL/hr over 30 Minutes Intravenous Every 24 hours 11/21/22 1920 11/23/22 1143   11/15/22 1900  nitrofurantoin  (macrocrystal-monohydrate) (MACROBID) capsule 100 mg  Status:  Discontinued        100 mg Oral Daily 11/15/22 1802 11/15/22 1832       Objective: Vitals:   11/24/22 0047 11/24/22 0622 11/24/22 0750 11/24/22 1114  BP: (!) 140/56 (!) 153/61 (!) 157/59 (!) 141/63  Pulse: 69 72 72 73  Resp: 16 17 18 18   Temp: 97.8 F (36.6 C) 97.6 F (36.4 C) 97.7 F (36.5 C) 97.8 F (36.6 C)  TempSrc: Axillary Oral Oral Oral  SpO2: 94% 93% 94% 94%  Weight:  80.3 kg    Height:        Intake/Output Summary (Last 24 hours) at 11/24/2022 1222 Last data filed at 11/24/2022 0440 Gross per 24 hour  Intake 480 ml  Output 1250 ml  Net -770 ml   Filed Weights   11/22/22 0416 11/23/22 0452 11/24/22 0622  Weight: 76.5 kg 70.4 kg 80.3 kg    Examination:  Frail elderly female sitting up in bed, AAOx2, mild to moderate cognitive deficits CVS: S1-S2, regular rhythm Lungs: Poor air movement bilaterally, decreased at the bases Abdomen: Soft, nontender, bowel sounds present Extremities: No edema  Psychiatry: Flat affect    Data Reviewed: I have personally reviewed following labs and imaging studies  CBC: Recent Labs  Lab 11/19/22 0053 11/21/22 0855 11/22/22 0052 11/23/22 0058 11/24/22 0048  WBC 3.9* 3.5* 3.7* 4.0 4.5  NEUTROABS  --  2.1 2.0  --   --   HGB 9.2* 9.3* 9.4* 10.0* 9.3*  HCT 29.1* 28.4* 29.5* 30.2* 29.0*  MCV 98.0 96.9 98.7 95.6 96.3  PLT 198 172 171 182 180    Basic Metabolic Panel: Recent Labs  Lab 11/18/22 0100 11/19/22 0053 11/20/22 0745 11/21/22 0855 11/22/22 0052 11/23/22 0058 11/24/22 0048  NA 135 133* 130* 134* 133* 136 138  K 3.6 3.9 4.0 4.0 4.0 4.0 3.7  CL 105 103 99 106 105 105 108  CO2 22 22 21* 21* 20* 20* 23  GLUCOSE 104* 120* 99 114* 132* 141* 136*  BUN 43* 45* 48* 50* 52* 50* 42*  CREATININE 2.75* 2.84* 3.03* 2.94* 2.89* 2.55* 1.99*  CALCIUM 8.6* 8.8* 9.0 8.7* 8.8* 9.2 9.2  MG 2.0 2.1 1.9 1.8  --   --   --   PHOS  --   --  4.8* 4.9* 4.7* 3.9  --      GFR: Estimated Creatinine Clearance: 18.1 mL/min (A) (by C-G formula based on SCr of 1.99 mg/dL (H)).  Liver Function Tests: Recent Labs  Lab 11/20/22 0745 11/21/22 0855 11/22/22 0052 11/23/22 0058  ALBUMIN 2.9* 2.8* 2.8* 2.8*    CBG: Recent Labs  Lab 11/23/22 1126 11/23/22 1649 11/23/22 2131 11/24/22 0628 11/24/22 1112  GLUCAP 115* 131* 192* 102* 122*     Recent Results (from the past 240 hour(s))  Urine Culture (for pregnant, neutropenic or urologic patients or patients with an indwelling urinary catheter)     Status: Abnormal   Collection Time: 11/21/22 12:50 PM   Specimen: Urine, Catheterized  Result Value Ref Range Status   Specimen Description URINE, CATHETERIZED  Final   Special Requests NONE  Final   Culture (A)  Final    >=100,000 COLONIES/mL LACTOBACILLUS SPECIES Standardized susceptibility testing for this organism is not available. Performed at [facility], [address], [place], [place] [address]  Report Status 11/22/2022 FINAL  Final    Scheduled Meds:  acetaminophen  500 mg Oral BID   amLODipine  2.5 mg Oral Daily   dronabinol  2.5 mg Oral BID AC   furosemide  40 mg Oral Daily   gabapentin  100 mg Oral QHS   heparin  5,000 Units Subcutaneous Q8H   hydrALAZINE  25 mg Oral Q8H   iron polysaccharides  150 mg Oral Daily   isosorbide mononitrate  15 mg Oral Daily   levETIRAcetam  500 mg Oral BID   metoprolol tartrate  50 mg Oral BID   mirtazapine  15 mg Oral QHS   pantoprazole  40 mg Oral Daily   QUEtiapine  12.5 mg Oral QHS   sodium chloride flush  3 mL Intravenous Q12H   Continuous Infusions:     LOS: 8 days    Time spent: 35 minutes    Zannie Cove, MD [facility]   11/24/2022, 12:22 PM

## 2022-11-25 DIAGNOSIS — I5033 Acute on chronic diastolic (congestive) heart failure: Secondary | ICD-10-CM | POA: Diagnosis not present

## 2022-11-25 LAB — COMPREHENSIVE METABOLIC PANEL
ALT: 18 U/L (ref 0–44)
AST: 19 U/L (ref 15–41)
Albumin: 2.9 g/dL — ABNORMAL LOW (ref 3.5–5.0)
Alkaline Phosphatase: 128 U/L — ABNORMAL HIGH (ref 38–126)
Anion gap: 10 (ref 5–15)
BUN: 39 mg/dL — ABNORMAL HIGH (ref 8–23)
CO2: 23 mmol/L (ref 22–32)
Calcium: 9.4 mg/dL (ref 8.9–10.3)
Chloride: 105 mmol/L (ref 98–111)
Creatinine, Ser: 2.25 mg/dL — ABNORMAL HIGH (ref 0.44–1.00)
GFR, Estimated: 20 mL/min — ABNORMAL LOW (ref >60)
Glucose, Bld: 100 mg/dL — ABNORMAL HIGH (ref 70–99)
Potassium: 3.9 mmol/L (ref 3.5–5.1)
Sodium: 138 mmol/L (ref 135–145)
Total Bilirubin: 0.4 mg/dL (ref 0.3–1.2)
Total Protein: 6.2 g/dL — ABNORMAL LOW (ref 6.5–8.1)

## 2022-11-25 LAB — CBC
HCT: 28.5 % — ABNORMAL LOW (ref 36.0–46.0)
Hemoglobin: 9.4 g/dL — ABNORMAL LOW (ref 12.0–15.0)
MCH: 32 pg (ref 26.0–34.0)
MCHC: 33 g/dL (ref 30.0–36.0)
MCV: 96.9 fL (ref 80.0–100.0)
Platelets: 169 10*3/uL (ref 150–400)
RBC: 2.94 MIL/uL — ABNORMAL LOW (ref 3.87–5.11)
RDW: 15.4 % (ref 11.5–15.5)
WBC: 3.7 10*3/uL — ABNORMAL LOW (ref 4.0–10.5)
nRBC: 0 % (ref 0.0–0.2)

## 2022-11-25 LAB — GLUCOSE, CAPILLARY
Glucose-Capillary: 85 mg/dL (ref 70–99)
Glucose-Capillary: 112 mg/dL — ABNORMAL HIGH (ref 70–99)

## 2022-11-25 MED ORDER — METOPROLOL TARTRATE 100 MG PO TABS: 50.0000 mg | ORAL_TABLET | Freq: Two times a day (BID) | ORAL | Status: DC

## 2022-11-25 MED ORDER — DRONABINOL 2.5 MG PO CAPS: 2.5000 mg | ORAL_CAPSULE | Freq: Two times a day (BID) | ORAL | 0 refills | Status: AC

## 2022-11-25 MED ORDER — ISOSORBIDE MONONITRATE ER 30 MG PO TB24
15.0000 mg | ORAL_TABLET | Freq: Every day | ORAL | 0 refills | Status: AC
  Filled 2022-11-26: qty 30, 60d supply, fill #0

## 2022-11-25 NOTE — TOC Transition Note (Signed)
Transition of Care [facility]) - CM/SW Discharge Note   Patient Details  Name: MELBA ARAKI MRN: [id number] Date of Birth: 1929-01-27  Transition of Care [facility] CM/SW Contact:  Lawerance Sabal, RN Phone Number: 11/25/2022, 11:41 AM   Clinical Narrative:     Sherron Monday w patients daughter over the phone.  Confirmed plan to DC to home with [facility] services through [place], liaison notified of DC.  Confirmed desire for outpatient palliative, referral made to [facility] who will review Monday.  Daughter to transport home. No other TOC needs identified for DC    Final next level of care: Home w Home Health Services Barriers to Discharge: No Barriers Identified   Patient Goals and CMS Choice CMS Medicare.gov Compare Post Acute Care list provided to:: Patient Represenative (must comment) Choice offered to / list presented to : Adult Children  Discharge Placement                         Discharge Plan and Services Additional resources added to the After Visit Summary for   In-house Referral: NA Discharge Planning Services: CM Consult Post Acute Care Choice: Home Health          DME Arranged: N/A DME Agency: NA       HH Arranged: PT HH Agency: [facility] Date [facility] Agency Contacted: 11/25/22 Time HH Agency Contacted: 1140 Representative spoke with at [facility] Agency: Kandee Keen  Social Determinants of Health (SDOH) Interventions SDOH Screenings   Food Insecurity: No Food Insecurity (07/08/2022)  Housing: Low Risk  (07/08/2022)  Transportation Needs: No Transportation Needs (07/08/2022)  Utilities: Not At Risk (07/08/2022)  Tobacco Use: Medium Risk (11/15/2022)     Readmission Risk Interventions    11/16/2022    5:11 PM 07/25/2022    1:23 PM  Readmission Risk Prevention Plan  Transportation Screening Complete Complete  HRI or Home Care Consult Complete   Palliative Care Screening Not Applicable   Medication Review (RN Care Manager) Complete Complete  PCP or Specialist  appointment within 3-5 days of discharge  Complete  HRI or Home Care Consult  Complete  SW Recovery Care/Counseling Consult  Complete  Palliative Care Screening  Not Applicable  Skilled Nursing Facility  Patient Refused

## 2022-11-25 NOTE — Discharge Summary (Signed)
Physician Discharge Summary  [name] ZOX:[id number] DOB: 1929/06/30 DOA: 11/15/2022  PCP: Renford Dills, MD  Admit date: 11/15/2022 Discharge date: 11/25/2022  Time spent: 45 minutes  Recommendations for Outpatient Follow-up:  PCP in 1 week Outpatient palliative care, continue goals of care discussions   Discharge Diagnoses:  Principal Problem:   Acute on chronic diastolic CHF (congestive heart failure) (HCC) Active Problems:   Acute renal failure superimposed on stage 4 chronic kidney disease (HCC)   Type 2 diabetes mellitus with complication, without long-term current use of insulin (HCC)   Hypertension   GERD (gastroesophageal reflux disease)   Dementia (HCC)   History of femur fracture   Severe pulmonary hypertension (HCC)   Acute on chronic diastolic (congestive) heart failure (HCC)   AKI (acute kidney injury) (HCC)   Discharge Condition: Improved  Diet recommendation: Low-sodium, heart healthy  Filed Weights   11/23/22 0452 11/24/22 0622 11/25/22 0630  Weight: 70.4 kg 80.3 kg 67.6 kg    History of present illness:  [name] is a 89 y.o. female with PMH significant of chronic diastolic CHF, RV failure, severe pulmonary hypertension, dementia, CKD 4, emphysematous cystitis, uncontrolled hypertension, chronic pain, seizure disorder, gout. Patient was brought in by daughter secondary to complaints of poor p.o. intake as well as swelling in the leg. CXR with atelectasis vs infiltrates.  BNP >1900.  ECG old RBBB.  SpO2 88%. treated with IV diuresis. Developed AKI with serum creatinine trending from 1-2.3 at the time of admission to 3.03.  Hospital Course:   Acute on chronic diastolic CHF RV failure, severe pulmonary hypertension -2D echo  EF of 60 to 65%, grade 1 diastolic dysfunction, right ventricular overload, moderate RV failure with severe pulmonary hypertension and moderate tricuspid regurgitation. -13 pound weight gain to 154 at the time of admission   -Volume overload complicated by AKI on CKD 4 -Has been diuresed with IV Lasix intermittently, seen by the allergy this admission, now transitioned to oral Lasix, GDMT limited by CKD 4 -Conservative management of pulmonary hypertension on account of advanced age and frailty, -Palliative team following, plan for discharge home with outpatient palliative care    Acute renal failure on CKD stage IV -Baseline creatinine around 1.8 -Creatinine 2.3 on admission, peaked at 3, nephrology following, diuretics were held temporarily, treated with IV albumin as well  -Now stable in the 1.8-2 range, oral Lasix resumed   Epigastric tenderness -Resolved. -Lipase within normal limits.  LFTs within normal limits.  Abdominal films unremarkable.   Type 2 diabetes mellitus without long-term insulin use -Hemoglobin A1c 6.1 (11/15/2022). -Stable   Hypertensive urgency -Now stable, continue Norvasc, metoprolol, Imdur and hydralazine    Dementia -Stable, continue Seroquel, Remeron    History of seizures -Stable.   -Continue Keppra.    Chronic pain -Continue to low-dose gabapentin   Recurrent UTI -Patient noted to have started on Macrobid recently, due to worsening renal function this was discontinued -Urinalysis done on admission with moderate leukocytes, nitrite negative, many bacteria, WBCs > 50.  -Has been on IV ceftriaxone, completed 3 days of IV set Raxone, urine culture with lactobacillus, could be stool contaminant, antibiotics discontinued   Goals of care discussion -Palliative care consulted in the background of advanced age, dementia, severe pulmonary hypertension, CHF, now DNR, goals of care meeting completed, family declines hospice at this time, plan for outpatient palliative care follow-up     Discharge Exam: Vitals:   11/24/22 2346 11/25/22 0502  BP: (!) 129/49 (!) 153/69  Pulse: 74 70  Resp: 19 17  Temp: 98.3 F (36.8 C) 98.7 F (37.1 C)  SpO2: 93% 94%   Gen: Awake, Alert,  Oriented X 2, moderate cognitive deficits HEENT: no JVD Lungs: Poor air movement bilaterally CVS: S1S2/RRR Abd: soft, Non tender, non distended, BS present Extremities: No edema Skin: no new rashes on exposed skin   Discharge Instructions   Discharge Instructions     Amb Referral to Palliative Care   Complete by: As directed    Diet - low sodium heart healthy   Complete by: As directed    Diet Carb Modified   Complete by: As directed    Increase activity slowly   Complete by: As directed       Allergies as of 11/25/2022       Reactions   Ace Inhibitors Swelling, Rash, Cough   Prandin [repaglinide] Other (See Comments)   Caused significant peripheral edema   Dilaudid [hydromorphone Hcl] Itching, Other (See Comments)   Hallucinations (auditory and visual) also   Other Nausea And Vomiting   "Tussionex Pennkinetic ER"   Latex Rash   Tape Rash   Prefers PAPER TAPE, PLEASE!!        Medication List     STOP taking these medications    nitrofurantoin (macrocrystal-monohydrate) 100 MG capsule Commonly known as: MACROBID       TAKE these medications    acetaminophen 500 MG tablet Commonly known as: TYLENOL Take 2 tablets (1,000 mg total) by mouth every 8 (eight) hours as needed for mild pain or moderate pain. What changed: when to take this   allopurinol 100 MG tablet Commonly known as: ZYLOPRIM Take 100 mg by mouth 2 (two) times daily.   amLODipine 10 MG tablet Commonly known as: NORVASC Take 10 mg by mouth daily.   blood glucose meter kit and supplies Kit Dispense based on patient and insurance preference. Use up to four times daily as directed. (FOR ICD-9 250.00, 250.01). For QAC - HS accuchecks.   Centrum Silver 50+Women Tabs Take 1 tablet by mouth daily with breakfast.   dronabinol 2.5 MG capsule Commonly known as: MARINOL Take 1 capsule (2.5 mg total) by mouth 2 (two) times daily before lunch and supper.   FreeStyle Libre 2 Sensor Misc USE AS  DIRECTED. REAPPLY EVERY 14 DAYS What changed: See the new instructions.   furosemide 40 MG tablet Commonly known as: LASIX Take 40 mg by mouth daily.   gabapentin 100 MG capsule Commonly known as: NEURONTIN Take 100 mg by mouth 2 (two) times daily.   HYDRALAZINE HCL PO Take 1 tablet by mouth 3 (three) times daily.   isosorbide mononitrate 30 MG 24 hr tablet Commonly known as: IMDUR Take 0.5 tablets (15 mg total) by mouth daily.   levETIRAcetam 500 MG tablet Commonly known as: KEPPRA Take 500 mg by mouth 2 (two) times daily.   metoprolol tartrate 100 MG tablet Commonly known as: LOPRESSOR Take 0.5 tablets (50 mg total) by mouth 2 (two) times daily. What changed: how much to take   mirtazapine 15 MG tablet Commonly known as: REMERON Take 15 mg by mouth at bedtime.   omeprazole 20 MG capsule Commonly known as: PRILOSEC Take 20 mg by mouth every Monday, Wednesday, and Friday.   QUEtiapine 25 MG tablet Commonly known as: SEROQUEL Take 25 mg by mouth daily.               Durable Medical Equipment  (From admission, onward)  Start     Ordered   11/16/22 1434  For home use only DME oxygen  Once       Question Answer Comment  Length of Need 6 Months   Mode or (Route) Nasal cannula   Liters per Minute 2   Frequency Continuous (stationary and portable oxygen unit needed)   Oxygen delivery system Gas      11/16/22 1433           Allergies  Allergen Reactions   Ace Inhibitors Swelling, Rash and Cough   Prandin [Repaglinide] Other (See Comments)    Caused significant peripheral edema   Dilaudid [Hydromorphone Hcl] Itching and Other (See Comments)    Hallucinations (auditory and visual) also   Other Nausea And Vomiting    "Tussionex Pennkinetic ER"   Latex Rash   Tape Rash    Prefers PAPER TAPE, PLEASE!!    Follow-up Information     [facility] Follow up.   Specialty: Home Health Services Why: Agency will call you to set up  apt times Contact information: [address] [place] [address] [phone]                  The results of significant diagnostics from this hospitalization (including imaging, microbiology, ancillary and laboratory) are listed below for reference.    Significant Diagnostic Studies: ECHOCARDIOGRAM COMPLETE  Result Date: 11/16/2022    ECHOCARDIOGRAM REPORT   Patient Name:   CATY TESSLER Date of Exam: 11/16/2022 Medical Rec #:  [id number]  Height:       62.0 in Accession #:    [id number] Weight:       153.9 lb Date of Birth:  Jun 05, 1929  BSA:          1.710 m Patient Age:    89 years   BP:           120/55 mmHg Patient Gender: F          HR:           64 bpm. Exam Location:  Inpatient Procedure: 2D Echo, Cardiac Doppler and Color Doppler Indications:    CHF  History:        Patient has prior history of Echocardiogram examinations, most                 recent 03/11/2022. CHF, Pulmonary HTN; Risk Factors:Hypertension                 and Diabetes.  Sonographer:    Wallie Char Referring Phys: [id number] PRANAV M PATEL IMPRESSIONS  1. Left ventricular ejection fraction, by estimation, is 60 to 65%. The left ventricle has normal function. The left ventricle has no regional wall motion abnormalities. There is mild concentric left ventricular hypertrophy. Left ventricular diastolic parameters are consistent with Grade I diastolic dysfunction (impaired relaxation).  2. D-shaped interventricular septum suggestive of RV pressure/volume overload. Right ventricular systolic function is moderately reduced. The right ventricular size is moderately enlarged. There is severely elevated pulmonary artery systolic pressure. The estimated right ventricular systolic pressure is 68.9 mmHg.  3. Right atrial size was mildly dilated.  4. The mitral valve is normal in structure. No evidence of mitral valve regurgitation. No evidence of mitral stenosis.  5. Tricuspid valve regurgitation is moderate.  6. The  aortic valve is tricuspid. There is mild calcification of the aortic valve. Aortic valve regurgitation is not visualized. No aortic stenosis is present.  7. The inferior vena cava  is dilated in size with <50% respiratory variability, suggesting right atrial pressure of 15 mmHg. FINDINGS  Left Ventricle: Left ventricular ejection fraction, by estimation, is 60 to 65%. The left ventricle has normal function. The left ventricle has no regional wall motion abnormalities. The left ventricular internal cavity size was normal in size. There is  mild concentric left ventricular hypertrophy. Left ventricular diastolic parameters are consistent with Grade I diastolic dysfunction (impaired relaxation). Right Ventricle: D-shaped interventricular septum suggestive of RV pressure/volume overload. The right ventricular size is moderately enlarged. No increase in right ventricular wall thickness. Right ventricular systolic function is moderately reduced. There is severely elevated pulmonary artery systolic pressure. The tricuspid regurgitant velocity is 3.67 m/s, and with an assumed right atrial pressure of 15 mmHg, the estimated right ventricular systolic pressure is 68.9 mmHg. Left Atrium: Left atrial size was normal in size. Right Atrium: Right atrial size was mildly dilated. Pericardium: There is no evidence of pericardial effusion. Mitral Valve: The mitral valve is normal in structure. Mild mitral annular calcification. No evidence of mitral valve regurgitation. No evidence of mitral valve stenosis. MV peak gradient, 7.2 mmHg. The mean mitral valve gradient is 2.0 mmHg. Tricuspid Valve: The tricuspid valve is normal in structure. Tricuspid valve regurgitation is moderate. Aortic Valve: The aortic valve is tricuspid. There is mild calcification of the aortic valve. Aortic valve regurgitation is not visualized. No aortic stenosis is present. Aortic valve mean gradient measures 3.5 mmHg. Aortic valve peak gradient measures 7.2  mmHg. Aortic valve area, by VTI measures 2.15 cm. Pulmonic Valve: The pulmonic valve was normal in structure. Pulmonic valve regurgitation is trivial. Aorta: The aortic root is normal in size and structure. Venous: The inferior vena cava is dilated in size with less than 50% respiratory variability, suggesting right atrial pressure of 15 mmHg. IAS/Shunts: No atrial level shunt detected by color flow Doppler.  LEFT VENTRICLE PLAX 2D LVIDd:         3.60 cm     Diastology LVIDs:         2.30 cm     LV e' medial:    4.87 cm/s LV PW:         1.20 cm     LV E/e' medial:  16.9 LV IVS:        1.30 cm     LV e' lateral:   3.48 cm/s LVOT diam:     1.80 cm     LV E/e' lateral: 23.7 LV SV:         65 LV SV Index:   38 LVOT Area:     2.54 cm  LV Volumes (MOD) LV vol d, MOD A2C: 64.0 ml LV vol d, MOD A4C: 57.2 ml LV vol s, MOD A2C: 20.4 ml LV vol s, MOD A4C: 17.5 ml LV SV MOD A2C:     43.6 ml LV SV MOD A4C:     57.2 ml LV SV MOD BP:      41.7 ml RIGHT VENTRICLE             IVC RV Basal diam:  4.40 cm     IVC diam: 2.30 cm RV S prime:     12.00 cm/s TAPSE (M-mode): 2.0 cm LEFT ATRIUM             Index        RIGHT ATRIUM           Index LA diam:        3.30  cm 1.93 cm/m   RA Area:     21.20 cm LA Vol (A2C):   44.3 ml 25.90 ml/m  RA Volume:   63.80 ml  37.30 ml/m LA Vol (A4C):   52.4 ml 30.64 ml/m LA Biplane Vol: 50.4 ml 29.47 ml/m  AORTIC VALVE                    PULMONIC VALVE AV Area (Vmax):    2.16 cm     PR End Diast Vel: 5.66 msec AV Area (Vmean):   2.19 cm AV Area (VTI):     2.15 cm AV Vmax:           134.50 cm/s AV Vmean:          92.650 cm/s AV VTI:            0.304 m AV Peak Grad:      7.2 mmHg AV Mean Grad:      3.5 mmHg LVOT Vmax:         114.00 cm/s LVOT Vmean:        79.700 cm/s LVOT VTI:          0.256 m LVOT/AV VTI ratio: 0.84  AORTA Ao Root diam: 3.30 cm Ao Asc diam:  3.60 cm MITRAL VALVE                TRICUSPID VALVE MV Area (PHT): 1.90 cm     TR Peak grad:   53.9 mmHg MV Area VTI:   1.84 cm     TR  Mean grad:   28.0 mmHg MV Peak grad:  7.2 mmHg     TR Vmax:        367.00 cm/s MV Mean grad:  2.0 mmHg     TR Vmean:       242.0 cm/s MV Vmax:       1.34 m/s MV Vmean:      69.7 cm/s    SHUNTS MV Decel Time: 399 msec     Systemic VTI:  0.26 m MV E velocity: 82.40 cm/s   Systemic Diam: 1.80 cm MV A velocity: 139.00 cm/s MV E/A ratio:  0.59 Dalton McleanMD Electronically signed by Wilfred Lacy Signature Date/Time: 11/16/2022/4:47:11 PM    Final    US RENAL  Result Date: 11/15/2022 CLINICAL DATA:  [id number] AKI (acute kidney injury) [id number] EXAM: RENAL / URINARY TRACT ULTRASOUND COMPLETE COMPARISON:  CT abdomen pelvis 07/31/2022, ultrasound renal 08/01/2022 FINDINGS: Right Kidney: Renal measurements: 9.9 x 3.5 x 4 cm = volume: 74 mL. Stable simple renal cysts measuring up to 1.6 cm. Echogenicity within normal limits. No solid mass or hydronephrosis visualized. Left Kidney: Renal measurements: 10.1 x 4.6 x 4.1 cm = volume: 101 mL. Stable simple renal cyst measuring up to 5.4 cm along the inferior pole. Echogenicity within normal limits. No solid mass or hydronephrosis visualized. Bladder: Appears normal for degree of bladder distention. Other: None. IMPRESSION: 1. Stable simple renal cysts-no further follow-up indicated. 2. Otherwise unremarkable renal ultrasound. Electronically Signed   By: Tish Frederickson M.D.   On: 11/15/2022 20:23   DG Abd Portable 1V  Result Date: 11/15/2022 CLINICAL DATA:  Pain EXAM: PORTABLE ABDOMEN - 1 VIEW COMPARISON:  None Available. FINDINGS: The bowel gas pattern is normal. No radio-opaque calculi or other significant radiographic abnormality are seen. IMPRESSION: Negative. Electronically Signed   By: Layla Maw M.D.   On: 11/15/2022 19:36   DG Chest Portable 1 View  Addendum Date: 11/15/2022  ADDENDUM REPORT: 11/15/2022 12:52 Electronically Signed   By: Lupita Raider M.D.   On: 11/15/2022 12:52   Result Date: 11/15/2022 CLINICAL DATA:  Shortness of breath. EXAM:  PORTABLE CHEST 1 VIEW COMPARISON:  July 31, 2022. FINDINGS: Stable cardiomegaly. Minimal right basilar subsegmental atelectasis or possibly infiltrate is noted. Minimal left lingular subsegmental atelectasis or scarring is noted. Degenerative changes are seen involving the right glenohumeral joint. IMPRESSION: Minimal right basilar subsegmental atelectasis or possibly infiltrate. Minimal left lingular subsegmental atelectasis or scarring. Aortic Atherosclerosis (ICD10-I70.0). Electronically Signed: By: Lupita Raider M.D. On: 11/15/2022 12:25    Microbiology: Recent Results (from the past 240 hour(s))  Urine Culture (for pregnant, neutropenic or urologic patients or patients with an indwelling urinary catheter)     Status: Abnormal   Collection Time: 11/21/22 12:50 PM   Specimen: Urine, Catheterized  Result Value Ref Range Status   Specimen Description URINE, CATHETERIZED  Final   Special Requests NONE  Final   Culture (A)  Final    >=100,000 COLONIES/mL LACTOBACILLUS SPECIES Standardized susceptibility testing for this organism is not available. Performed at [facility], [address] [place], [place] [address]    Report Status 11/22/2022 FINAL  Final     Labs: Basic Metabolic Panel: Recent Labs  Lab 11/19/22 0053 11/20/22 0745 11/21/22 0855 11/22/22 0052 11/23/22 0058 11/24/22 0048 11/25/22 0939  NA 133* 130* 134* 133* 136 138 138  K 3.9 4.0 4.0 4.0 4.0 3.7 3.9  CL 103 99 106 105 105 108 105  CO2 22 21* 21* 20* 20* 23 23  GLUCOSE 120* 99 114* 132* 141* 136* 100*  BUN 45* 48* 50* 52* 50* 42* 39*  CREATININE 2.84* 3.03* 2.94* 2.89* 2.55* 1.99* 2.25*  CALCIUM 8.8* 9.0 8.7* 8.8* 9.2 9.2 9.4  MG 2.1 1.9 1.8  --   --   --   --   PHOS  --  4.8* 4.9* 4.7* 3.9  --   --    Liver Function Tests: Recent Labs  Lab 11/20/22 0745 11/21/22 0855 11/22/22 0052 11/23/22 0058 11/25/22 0939  AST  --   --   --   --  19  ALT  --   --   --   --  18  ALKPHOS  --   --   --   --   128*  BILITOT  --   --   --   --  0.4  PROT  --   --   --   --  6.2*  ALBUMIN 2.9* 2.8* 2.8* 2.8* 2.9*   No results for input(s): "LIPASE", "AMYLASE" in the last 168 hours. No results for input(s): "AMMONIA" in the last 168 hours. CBC: Recent Labs  Lab 11/21/22 0855 11/22/22 0052 11/23/22 0058 11/24/22 0048 11/25/22 0939  WBC 3.5* 3.7* 4.0 4.5 3.7*  NEUTROABS 2.1 2.0  --   --   --   HGB 9.3* 9.4* 10.0* 9.3* 9.4*  HCT 28.4* 29.5* 30.2* 29.0* 28.5*  MCV 96.9 98.7 95.6 96.3 96.9  PLT 172 171 182 180 169   Cardiac Enzymes: No results for input(s): "CKTOTAL", "CKMB", "CKMBINDEX", "TROPONINI" in the last 168 hours. BNP: BNP (last 3 results) Recent Labs    06/30/22 1906 07/03/22 0225 11/15/22 1130  BNP 103.8* 309.9* 1,969.7*    ProBNP (last 3 results) No results for input(s): "PROBNP" in the last 8760 hours.  CBG: Recent Labs  Lab 11/24/22 0628 11/24/22 1112 11/24/22 1612 11/24/22 2050 11/25/22  0650  GLUCAP 102* 122* 134* 113* 85       Signed:  Zannie Cove MD.  [facility] 11/25/2022, 10:29 AM

## 2022-11-25 NOTE — Progress Notes (Signed)
Mobility Specialist Progress Note:   11/25/22 1215  Mobility  Activity Ambulated with assistance to bathroom  Level of Assistance Minimal assist, patient does 75% or more  Assistive Device Front wheel walker  Distance Ambulated (ft) 15 ft  Activity Response Tolerated fair  Mobility Referral Yes  $Mobility charge 1 Mobility   Pt received in BR, family member states she has been in there for an hour. With max encouragement and incr time, pt eventually agreeable to walk back to chair. Required minA to stand, minG during ambulation. Pt left sitting in chair with all needs met, visitor in room.  Addison Lank Mobility Specialist Please contact via SecureChat or  Rehab office at [phone]

## 2022-11-25 NOTE — Plan of Care (Signed)
  Problem: Education: Goal: Knowledge of General Education information will improve Description: Including pain rating scale, medication(s)/side effects and non-pharmacologic comfort measures Outcome: Adequate for Discharge   Problem: Health Behavior/Discharge Planning: Goal: Ability to manage health-related needs will improve Outcome: Adequate for Discharge   Problem: Clinical Measurements: Goal: Ability to maintain clinical measurements within normal limits will improve Outcome: Adequate for Discharge Goal: Will remain free from infection Outcome: Adequate for Discharge Goal: Diagnostic test results will improve Outcome: Adequate for Discharge Goal: Respiratory complications will improve Outcome: Adequate for Discharge Goal: Cardiovascular complication will be avoided Outcome: Adequate for Discharge   Problem: Activity: Goal: Risk for activity intolerance will decrease Outcome: Adequate for Discharge   Problem: Coping: Goal: Level of anxiety will decrease Outcome: Adequate for Discharge   Problem: Elimination: Goal: Will not experience complications related to urinary retention Outcome: Adequate for Discharge   Problem: Safety: Goal: Ability to remain free from injury will improve Outcome: Adequate for Discharge   Problem: Skin Integrity: Goal: Risk for impaired skin integrity will decrease Outcome: Adequate for Discharge   Problem: Education: Goal: Ability to demonstrate management of disease process will improve Outcome: Adequate for Discharge Goal: Ability to verbalize understanding of medication therapies will improve Outcome: Adequate for Discharge Goal: Individualized Educational Video(s) Outcome: Adequate for Discharge   Problem: Activity: Goal: Capacity to carry out activities will improve Outcome: Adequate for Discharge   Problem: Cardiac: Goal: Ability to achieve and maintain adequate cardiopulmonary perfusion will improve Outcome: Adequate for  Discharge

## 2022-11-26 ENCOUNTER — Other Ambulatory Visit (HOSPITAL_BASED_OUTPATIENT_CLINIC_OR_DEPARTMENT_OTHER): Payer: Self-pay

## 2022-11-27 ENCOUNTER — Encounter: Payer: PPO | Admitting: Physical Therapy

## 2022-11-29 ENCOUNTER — Telehealth: Payer: Self-pay

## 2022-11-29 ENCOUNTER — Other Ambulatory Visit: Payer: Self-pay

## 2022-11-29 ENCOUNTER — Encounter: Payer: PPO | Admitting: Physical Therapy

## 2022-11-29 DIAGNOSIS — Z515 Encounter for palliative care: Secondary | ICD-10-CM

## 2022-11-29 NOTE — Progress Notes (Signed)
COMMUNITY PALLIATIVE CARE SW NOTE  PATIENT NAME: [name] DOB: 1929/05/04 MRN: [id number]  PRIMARY CARE PROVIDER: Renford Dills, MD  RESPONSIBLE PARTY:  Acct ID - Guarantor Home Phone Work Phone Relationship Acct Type  [id number] AYANNI, TUN I [phone]  Self P/F     [address], [place], [place] [address]   Initial Palliative Care Telephonic Encounter/Clinical Social Work   PC SW telephoned patient's daughter-Sabrina regarding patient the palliative care referral. SW provided education regarding the services, role in patient's care and contact information. Martie Lee provided an initial verbal consent to services. She also provided a status update on patient.   Martie Lee report that patient's CHF issues are managed. Her BP was high this morning, but came down by midday. Patient is sleeping more.overall. She is able to ambulate with her walker. Her intake is poor and it is difficult to get her to eat. Patient is only taking a bites of food. She was taking marinol in the hospital that help improved her appetite. However, she has not been able to get it filled since she has been home. She has awaken at night reporting she was having difficulty catching her breath. She has gain at least 1 pound since yesterday that [place] attributes to fluid retention. She wears Ted hose. Patient manages her own personal care. Some days she is weaker than others and require assistance. Patient is able to ambulate with a walker.  Patient has been complaining about having a headache, which is managed with Tylenol Rapid Release. She does not have any o2.  Daughter's goal is for patient to be comfortable, pain free and get as much joy out of life as possible. They have several family events that are coming up and would like to attend.  Patient is a DNR-need form POA/HCPAO is daughter-Sabrina   Patient was born in [place], [place]. She has lived with daughter since 2016. She has a MA in education from [employer]. 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 Martie Lee is an only child. She will often have 24 caregiving at the home, as she continues work full-time. She is very close to her mother. Social History   Tobacco Use   Smoking status: Former   Smokeless tobacco: Never  Substance Use Topics   Alcohol use: No    Comment: drinks caffeine daily    CODE STATUS: To be further assessed ADVANCED DIRECTIVES: To be further assessed MOST FORM COMPLETE:  To be further assessed  HOSPICE EDUCATION PROVIDED: Yes, education provided  Duration of encounter and documentation: 30 minutes  [employer], LCSW

## 2022-11-29 NOTE — Telephone Encounter (Signed)
(  10: 53 am) PC SW contacted patient's daughter-Sabrina regarding palliative care referral received for patient. Martie Lee advised that she was on a conference call would call SW back in 30 minutes.

## 2022-12-03 ENCOUNTER — Other Ambulatory Visit: Payer: Self-pay

## 2022-12-03 DIAGNOSIS — Z515 Encounter for palliative care: Secondary | ICD-10-CM

## 2022-12-04 ENCOUNTER — Encounter: Payer: PPO | Admitting: Physical Therapy

## 2022-12-05 NOTE — Progress Notes (Signed)
PATIENT NAME: [name] DOB: [birth year] MRN: [id number]  PRIMARY CARE PROVIDER: Renford Dills, MD  RESPONSIBLE PARTY:  Acct ID - Guarantor Home Phone Work Phone Relationship Acct Type  [id number] SHAQUOIA, COHAN I [phone]  Self P/F     [address], [place], [place] [address]   [phone] Palliative Care Initial Encounter Note   RN arrived for initial in person palliative care visit to find that pt had travelled to [place] for the week. Daughter met RN at door and was very apologetic. States, " I have a business trip for the week out of state, and mom usually has gone to stay with my cousin in the past. I made this appt today because I didn't think she would be strong enough to travel this soon after hospitalization. She told me yesterday she wanted to go and stay with my cousin so I took her this morning."  Next scheduled visit: Monica to call next week to schedule.    Barbette Merino, RN

## 2022-12-11 ENCOUNTER — Encounter: Payer: PPO | Admitting: Physical Therapy

## 2022-12-13 ENCOUNTER — Encounter: Payer: PPO | Admitting: Physical Therapy

## 2022-12-13 DIAGNOSIS — F0390 Unspecified dementia without behavioral disturbance: Secondary | ICD-10-CM | POA: Diagnosis not present

## 2022-12-13 DIAGNOSIS — D631 Anemia in chronic kidney disease: Secondary | ICD-10-CM | POA: Diagnosis not present

## 2022-12-13 DIAGNOSIS — N179 Acute kidney failure, unspecified: Secondary | ICD-10-CM | POA: Diagnosis not present

## 2022-12-13 DIAGNOSIS — I5032 Chronic diastolic (congestive) heart failure: Secondary | ICD-10-CM | POA: Diagnosis not present

## 2022-12-13 DIAGNOSIS — I129 Hypertensive chronic kidney disease with stage 1 through stage 4 chronic kidney disease, or unspecified chronic kidney disease: Secondary | ICD-10-CM | POA: Diagnosis not present

## 2022-12-13 DIAGNOSIS — N390 Urinary tract infection, site not specified: Secondary | ICD-10-CM | POA: Diagnosis not present

## 2022-12-13 DIAGNOSIS — N184 Chronic kidney disease, stage 4 (severe): Secondary | ICD-10-CM | POA: Diagnosis not present

## 2022-12-18 ENCOUNTER — Telehealth: Payer: Self-pay

## 2022-12-18 NOTE — Telephone Encounter (Signed)
(  1:44 pm) PC SW rescheduled this appointment to 12/20/22 at 10:30 am. The daughter had to cancel previous appointment due to family emergency.

## 2022-12-20 ENCOUNTER — Other Ambulatory Visit: Payer: Self-pay

## 2022-12-20 DIAGNOSIS — Z515 Encounter for palliative care: Secondary | ICD-10-CM

## 2022-12-20 NOTE — Progress Notes (Signed)
PATIENT NAME: [name] DOB: Sep 08, 1928 MRN: [id number]  PRIMARY CARE PROVIDER: Renford Dills, MD  RESPONSIBLE PARTY:  Acct ID - Guarantor Home Phone Work Phone Relationship Acct Type  [id number] IRIHANNA, PRESSNELL I [phone]  Self P/F     [address], [place], [place] [address]    Palliative Care New Encounter Note   Completed home visit. Daughter Dava Najjar also present.     HISTORY OF PRESENT ILLNESS:  CKD 4, DM II, CHF, Dementia, GERD, Pulmonary HTN   TODAY'S VISIT:  Respiratory: denies SOB at this time; sometimes gets SOB with exertion  Cardiac: pitting edema to BLE; pt is wearing compression socks; gave Cardiac booklet and discussed diet, fluid retention and correct way to wear compression socks so the extremity does not have partial swelling  Cognitive: Dementia; forgetful; gave Dementia booklet and discussed pt's sleep pattern that is switched   Appetite: 1-2 meals daily; usually breakfast and dinner; drinks a supplement for lunch; snacks; has a fluid limit of under 50 oz  GI/GU: has a daily BM; no urinary issues at this time; has chronic UTIs   Mobility: ambulates with walker  ADLs: supervision with ADLs except dressing, toileting   Sleeping Pattern: sleeps during the day and is restless at night  Pain: denies pain at this time  Wt: weighed during visit - 154.5 lbs  Palliative Care/ Hospice: LPN explained role and purpose of palliative care including visit frequency. Also discussed benefits of hospice care as well as the differences between the two with patient.   Goals of Care: To stay in the home with her daughter     Next Appt Scheduled For: none scheduled as pt does not qualify for additional visits      CODE STATUS: DNR ADVANCED DIRECTIVES: N MOST FORM: N PPS: 50%   PHYSICAL EXAM:   T- 97.2 P- 66 O2- 93% on room air B/P- 126/72 Wt- 154.5  LUNGS: clear to auscultation  CARDIAC: Cor RRR EXTREMITIES: AROM x4 but limited SKIN: warm, dry, some  scabbed areason both forearms NEURO: negative except for gait problems, memory problems, and weakness     Sarajean Dessert Clementeen Graham, LPN

## 2023-01-01 ENCOUNTER — Ambulatory Visit (INDEPENDENT_AMBULATORY_CARE_PROVIDER_SITE_OTHER): Payer: PPO | Admitting: Podiatry

## 2023-01-01 ENCOUNTER — Other Ambulatory Visit (HOSPITAL_BASED_OUTPATIENT_CLINIC_OR_DEPARTMENT_OTHER): Payer: Self-pay

## 2023-01-01 DIAGNOSIS — E118 Type 2 diabetes mellitus with unspecified complications: Secondary | ICD-10-CM | POA: Diagnosis not present

## 2023-01-01 DIAGNOSIS — M79675 Pain in left toe(s): Secondary | ICD-10-CM | POA: Diagnosis not present

## 2023-01-01 DIAGNOSIS — B351 Tinea unguium: Secondary | ICD-10-CM

## 2023-01-01 DIAGNOSIS — M79674 Pain in right toe(s): Secondary | ICD-10-CM | POA: Diagnosis not present

## 2023-01-02 ENCOUNTER — Telehealth: Payer: Self-pay

## 2023-01-02 NOTE — Telephone Encounter (Signed)
1640 Palliative Care Note  Volunteer attempted to contact pt yesterday 01/01/23 for palliative check in. No answer, voicemail left.  Barbette Merino, RN

## 2023-01-06 ENCOUNTER — Encounter: Payer: Self-pay | Admitting: Podiatry

## 2023-01-06 NOTE — Progress Notes (Signed)
  Subjective:  Patient ID: [name], female    DOB: 1929/07/30,  MRN: [id number]  [name] presents to clinic today for preventative diabetic foot care and painful thick toenails that are difficult to trim. Pain interferes with ambulation. Aggravating factors include wearing enclosed shoe gear. Pain is relieved with periodic professional debridement. Chief Complaint  Patient presents with   Nail Problem    DFC,Referring Provider Renford Dills, MD,LOV:03/24,A1C:6.1,BS today:101      New problem(s): None.   PCP is Renford Dills, MD.  Allergies  Allergen Reactions   Ace Inhibitors Swelling, Rash and Cough   Prandin [Repaglinide] Other (See Comments)    Caused significant peripheral edema   Hydromorphone Itching, Other (See Comments) and Rash    Other Reaction: pruritis, Hallucinations (auditory and visual)   Dilaudid [Hydromorphone Hcl] Itching and Other (See Comments)    Hallucinations (auditory and visual) also   Latex Rash   Other Nausea And Vomiting and Rash    "Tussionex Pennkinetic ER"  Prefers PAPER TAPE, PLEASE!!   Tape Rash    Prefers PAPER TAPE, PLEASE!!    Review of Systems: Negative except as noted in the HPI. Objective:   Constitutional [name] is a pleasant 89 y.o. female, in NAD. AAO x 3.   Vascular Vascular Examination: Capillary refill time immediate b/l. Vascular status intact b/l with palpable pedal pulses. Pedal hair absent b/l. No edema. No pain with calf compression b/l. Skin temperature gradient WNL b/l. No cyanosis or clubbing b/l.   Neurological Examination: Sensation grossly intact b/l with 10 gram monofilament. Vibratory sensation intact b/l.   Dermatological Examination: Pedal skin with normal turgor, texture and tone b/l.  No open wounds. No interdigital macerations.   Toenails 1-5 right; 1, 3-5 left thick, discolored, elongated with subungual debris and pain on dorsal palpation.   Anonychia noted L 2nd toe. Nailbed(s) epithelialized.    Musculoskeletal Examination: Muscle strength 5/5 to all lower extremity muscle groups bilaterally. No pain, crepitus or joint limitation noted with ROM bilateral LE. No gross bony deformities bilaterally.  Radiographs: None  Last A1c:      Latest Ref Rng & Units 11/15/2022    8:39 PM 03/10/2022    4:52 PM  Hemoglobin A1C  Hemoglobin-A1c 4.8 - 5.6 % 6.1  6.4      Assessment:   1. Pain due to onychomycosis of toenails of both feet   2. Type 2 diabetes mellitus with complication, without long-term current use of insulin (HCC)    Plan:  Patient was evaluated and treated and all questions answered. Consent given for treatment as described below: -Patient's family member present. All questions/concerns addressed on today's visit. -Patient to continue soft, supportive shoe gear daily. -Toenails were debrided in length and girth 1-5 right foot, left great toe, L 3rd toe, L 4th toe, and L 5th toe with sterile nail nippers and dremel without iatrogenic bleeding.  -Patient/POA to call should there be question/concern in the interim.  Return in about 3 months (around 04/03/2023).  Freddie Breech, DPM

## 2023-01-09 DIAGNOSIS — I509 Heart failure, unspecified: Secondary | ICD-10-CM | POA: Diagnosis not present

## 2023-01-09 DIAGNOSIS — R300 Dysuria: Secondary | ICD-10-CM | POA: Diagnosis not present

## 2023-01-09 DIAGNOSIS — I130 Hypertensive heart and chronic kidney disease with heart failure and stage 1 through stage 4 chronic kidney disease, or unspecified chronic kidney disease: Secondary | ICD-10-CM | POA: Diagnosis not present

## 2023-01-09 DIAGNOSIS — N184 Chronic kidney disease, stage 4 (severe): Secondary | ICD-10-CM | POA: Diagnosis not present

## 2023-01-09 DIAGNOSIS — E1122 Type 2 diabetes mellitus with diabetic chronic kidney disease: Secondary | ICD-10-CM | POA: Diagnosis not present

## 2023-01-09 DIAGNOSIS — F03911 Unspecified dementia, unspecified severity, with agitation: Secondary | ICD-10-CM | POA: Diagnosis not present

## 2023-01-09 DIAGNOSIS — N189 Chronic kidney disease, unspecified: Secondary | ICD-10-CM | POA: Diagnosis not present

## 2023-01-09 DIAGNOSIS — G40909 Epilepsy, unspecified, not intractable, without status epilepticus: Secondary | ICD-10-CM | POA: Diagnosis not present

## 2023-01-09 DIAGNOSIS — I5189 Other ill-defined heart diseases: Secondary | ICD-10-CM | POA: Diagnosis not present

## 2023-01-09 DIAGNOSIS — M129 Arthropathy, unspecified: Secondary | ICD-10-CM | POA: Diagnosis not present

## 2023-01-21 ENCOUNTER — Other Ambulatory Visit (HOSPITAL_BASED_OUTPATIENT_CLINIC_OR_DEPARTMENT_OTHER): Payer: Self-pay

## 2023-02-09 ENCOUNTER — Other Ambulatory Visit (HOSPITAL_BASED_OUTPATIENT_CLINIC_OR_DEPARTMENT_OTHER): Payer: Self-pay

## 2023-03-04 ENCOUNTER — Other Ambulatory Visit (HOSPITAL_BASED_OUTPATIENT_CLINIC_OR_DEPARTMENT_OTHER): Payer: Self-pay

## 2023-03-06 DIAGNOSIS — D631 Anemia in chronic kidney disease: Secondary | ICD-10-CM | POA: Diagnosis not present

## 2023-03-06 DIAGNOSIS — I129 Hypertensive chronic kidney disease with stage 1 through stage 4 chronic kidney disease, or unspecified chronic kidney disease: Secondary | ICD-10-CM | POA: Diagnosis not present

## 2023-03-06 DIAGNOSIS — I5032 Chronic diastolic (congestive) heart failure: Secondary | ICD-10-CM | POA: Diagnosis not present

## 2023-03-06 DIAGNOSIS — F0390 Unspecified dementia without behavioral disturbance: Secondary | ICD-10-CM | POA: Diagnosis not present

## 2023-03-06 DIAGNOSIS — N184 Chronic kidney disease, stage 4 (severe): Secondary | ICD-10-CM | POA: Diagnosis not present

## 2023-04-05 ENCOUNTER — Other Ambulatory Visit: Payer: Self-pay

## 2023-04-05 ENCOUNTER — Emergency Department (HOSPITAL_BASED_OUTPATIENT_CLINIC_OR_DEPARTMENT_OTHER): Payer: PPO

## 2023-04-05 ENCOUNTER — Encounter (HOSPITAL_BASED_OUTPATIENT_CLINIC_OR_DEPARTMENT_OTHER): Payer: Self-pay | Admitting: Emergency Medicine

## 2023-04-05 ENCOUNTER — Emergency Department (HOSPITAL_BASED_OUTPATIENT_CLINIC_OR_DEPARTMENT_OTHER)
Admission: EM | Admit: 2023-04-05 | Discharge: 2023-04-05 | Disposition: A | Discharge status: Home / Self Care | Payer: PPO | Attending: Emergency Medicine | Admitting: Emergency Medicine

## 2023-04-05 DIAGNOSIS — I6782 Cerebral ischemia: Secondary | ICD-10-CM | POA: Diagnosis not present

## 2023-04-05 DIAGNOSIS — R410 Disorientation, unspecified: Secondary | ICD-10-CM | POA: Diagnosis not present

## 2023-04-05 DIAGNOSIS — N3000 Acute cystitis without hematuria: Secondary | ICD-10-CM | POA: Insufficient documentation

## 2023-04-05 DIAGNOSIS — I517 Cardiomegaly: Secondary | ICD-10-CM | POA: Diagnosis not present

## 2023-04-05 DIAGNOSIS — R4182 Altered mental status, unspecified: Secondary | ICD-10-CM | POA: Diagnosis not present

## 2023-04-05 DIAGNOSIS — Z79899 Other long term (current) drug therapy: Secondary | ICD-10-CM | POA: Insufficient documentation

## 2023-04-05 DIAGNOSIS — I700 Atherosclerosis of aorta: Secondary | ICD-10-CM | POA: Diagnosis not present

## 2023-04-05 DIAGNOSIS — Z91040 Latex allergy status: Secondary | ICD-10-CM | POA: Diagnosis not present

## 2023-04-05 LAB — COMPREHENSIVE METABOLIC PANEL
ALT: 23 U/L (ref 0–44)
AST: 30 U/L (ref 15–41)
Albumin: 3.7 g/dL (ref 3.5–5.0)
Alkaline Phosphatase: 217 U/L — ABNORMAL HIGH (ref 38–126)
Anion gap: 6 (ref 5–15)
BUN: 40 mg/dL — ABNORMAL HIGH (ref 8–23)
CO2: 29 mmol/L (ref 22–32)
Calcium: 9.6 mg/dL (ref 8.9–10.3)
Chloride: 101 mmol/L (ref 98–111)
Creatinine, Ser: 1.88 mg/dL — ABNORMAL HIGH (ref 0.44–1.00)
GFR, Estimated: 25 mL/min — ABNORMAL LOW (ref >60)
Glucose, Bld: 136 mg/dL — ABNORMAL HIGH (ref 70–99)
Potassium: 4.4 mmol/L (ref 3.5–5.1)
Sodium: 136 mmol/L (ref 135–145)
Total Bilirubin: 0.3 mg/dL (ref 0.3–1.2)
Total Protein: 7 g/dL (ref 6.5–8.1)

## 2023-04-05 LAB — CBC
HCT: 33.7 % — ABNORMAL LOW (ref 36.0–46.0)
Hemoglobin: 11.1 g/dL — ABNORMAL LOW (ref 12.0–15.0)
MCH: 31.2 pg (ref 26.0–34.0)
MCHC: 32.9 g/dL (ref 30.0–36.0)
MCV: 94.7 fL (ref 80.0–100.0)
Platelets: 208 10*3/uL (ref 150–400)
RBC: 3.56 MIL/uL — ABNORMAL LOW (ref 3.87–5.11)
RDW: 15 % (ref 11.5–15.5)
WBC: 4.6 10*3/uL (ref 4.0–10.5)
nRBC: 0 % (ref 0.0–0.2)

## 2023-04-05 LAB — URINALYSIS, W/ REFLEX TO CULTURE (INFECTION SUSPECTED)
Bilirubin Urine: NEGATIVE
Glucose, UA: NEGATIVE mg/dL
Hgb urine dipstick: NEGATIVE
Ketones, ur: NEGATIVE mg/dL
Nitrite: POSITIVE — AB
Protein, ur: 100 mg/dL — AB
Specific Gravity, Urine: 1.011 (ref 1.005–1.030)
WBC, UA: >50 WBC/hpf (ref 0–5)
pH: 6 (ref 5.0–8.0)

## 2023-04-05 LAB — AMMONIA: Ammonia: 18 umol/L (ref 9–35)

## 2023-04-05 LAB — TSH: TSH: 0.3 u[IU]/mL — ABNORMAL LOW (ref 0.350–4.500)

## 2023-04-05 LAB — LACTIC ACID, PLASMA: Lactic Acid, Venous: 1 mmol/L (ref 0.5–1.9)

## 2023-04-05 MED ORDER — SODIUM CHLORIDE 0.9 % IV BOLUS
500.0000 mL | Freq: Once | INTRAVENOUS | Status: AC
  Administered 2023-04-05: 500 mL via INTRAVENOUS

## 2023-04-05 MED ORDER — CEFPODOXIME PROXETIL 200 MG PO TABS: 200.0000 mg | ORAL_TABLET | Freq: Every day | ORAL | 0 refills | Status: AC

## 2023-04-05 NOTE — ED Triage Notes (Signed)
Daughter brought in for concern change in behavior. Last night was up, screaming. Some confusion, delirium. "I dont feel good"  Denies falls.\ Decreases intake, per daughter. On macrobid maintenance for UTIS

## 2023-04-05 NOTE — Discharge Instructions (Signed)
Your history, exam, and evaluation are consistent with urinary tract infection likely leading to your confusion and intermittent delirium.  We had a shared decision-making conversation offering admission for further management you and family would like to go home.  Please take the new antibiotics which I confirmed is the appropriate one per pharmacy.  Please take the medications, rest and stay hydrated.  Please follow-up with your primary doctor.  If any symptoms change or worsen acutely, please return to the nearest emergency department.

## 2023-04-05 NOTE — ED Provider Notes (Signed)
[place] EMERGENCY DEPARTMENT AT [facility] Provider Note   CSN: [id number] Arrival date & time: 04/05/23  1101     History  Chief Complaint  Patient presents with   Altered Mental Status    [name] is a 89 y.o. female.  The history is provided by the patient, medical records and a relative. No language interpreter was used.  Altered Mental Status Presenting symptoms: confusion and disorientation   Severity:  Unable to specify Most recent episode:  More than 2 days ago Episode history:  Unable to specify Duration:  1 week Timing:  Sporadic Progression:  Waxing and waning Chronicity:  Recurrent Associated symptoms: no abdominal pain, no agitation, no depression, no fever, no headaches, no light-headedness, no nausea, no palpitations, no rash, no seizures, no visual change and no vomiting        Home Medications Prior to Admission medications   Medication Sig Start Date End Date Taking? Authorizing Provider  acetaminophen (TYLENOL) 500 MG tablet Take 2 tablets (1,000 mg total) by mouth every 8 (eight) hours as needed for mild pain or moderate pain. Patient taking differently: Take 1,000 mg by mouth 2 (two) times daily as needed for mild pain or moderate pain. 07/25/22   Ghimire, Werner Lean, MD  allopurinol (ZYLOPRIM) 100 MG tablet Take 100 mg by mouth 2 (two) times daily.    [provider]  amLODipine (NORVASC) 10 MG tablet Take 10 mg by mouth daily.    [provider]  blood glucose meter kit and supplies KIT Dispense based on patient and insurance preference. Use up to four times daily as directed. (FOR ICD-9 250.00, 250.01). For QAC - HS accuchecks. 11/27/20   Leroy Sea, MD  Continuous Blood Gluc Sensor (FREESTYLE LIBRE 2 SENSOR) MISC USE AS DIRECTED. REAPPLY EVERY 14 DAYS Patient taking differently: Inject 1 Device into the skin every 14 (fourteen) days. 11/20/21   Romero Belling, MD  dronabinol (MARINOL) 2.5 MG capsule Take 1 capsule  (2.5 mg total) by mouth 2 (two) times daily before lunch and supper. 11/25/22   Zannie Cove, MD  furosemide (LASIX) 40 MG tablet Take 40 mg by mouth daily. 08/31/22   [provider]  gabapentin (NEURONTIN) 100 MG capsule Take 100 mg by mouth 2 (two) times daily.    [provider]  HYDRALAZINE HCL PO Take 1 tablet by mouth 3 (three) times daily.    [provider]  isosorbide mononitrate (IMDUR) 30 MG 24 hr tablet Take 0.5 tablets (15 mg total) by mouth daily. 11/25/22   Zannie Cove, MD  levETIRAcetam (KEPPRA) 500 MG tablet Take 500 mg by mouth 2 (two) times daily.    [provider]  metoprolol tartrate (LOPRESSOR) 100 MG tablet Take 0.5 tablets (50 mg total) by mouth 2 (two) times daily. 11/25/22   Zannie Cove, MD  mirtazapine (REMERON) 15 MG tablet Take 15 mg by mouth at bedtime.     [provider]  Multiple Vitamins-Minerals (CENTRUM SILVER 50+WOMEN) TABS Take 1 tablet by mouth daily with breakfast.    [provider]  omeprazole (PRILOSEC) 20 MG capsule Take 20 mg by mouth every Monday, Wednesday, and Friday.    [provider]  QUEtiapine (SEROQUEL) 25 MG tablet Take 25 mg by mouth daily. 08/31/22   [provider]      Allergies    Ace inhibitors, Prandin [repaglinide], Hydromorphone, Dilaudid [hydromorphone hcl], Latex, Other, and Tape    Review of Systems   Review  of Systems  Constitutional:  Positive for fatigue. Negative for chills, diaphoresis and fever.  HENT:  Negative for congestion.   Eyes:  Negative for visual disturbance.  Respiratory:  Negative for cough, chest tightness, shortness of breath and wheezing.   Cardiovascular:  Negative for chest pain and palpitations.  Gastrointestinal:  Negative for abdominal pain, constipation, diarrhea, nausea and vomiting.  Genitourinary:  Positive for frequency. Negative for dysuria and flank pain.       Increased urine  Musculoskeletal:  Negative for back  pain, neck pain and neck stiffness.  Skin:  Negative for rash and wound.  Neurological:  Negative for dizziness, seizures, light-headedness and headaches.  Psychiatric/Behavioral:  Positive for confusion. Negative for agitation.   All other systems reviewed and are negative.   Physical Exam Updated Vital Signs BP (!) 157/73 (BP Location: Right Arm)   Pulse 71   Temp 98.1 F (36.7 C) (Oral)   Resp 20   SpO2 98%  Physical Exam Vitals and nursing note reviewed.  Constitutional:      General: She is not in acute distress.    Appearance: She is well-developed. She is not ill-appearing, toxic-appearing or diaphoretic.  HENT:     Head: Normocephalic and atraumatic.     Right Ear: There is no impacted cerumen.     Nose: No congestion or rhinorrhea.     Mouth/Throat:     Mouth: Mucous membranes are dry.     Pharynx: No oropharyngeal exudate or posterior oropharyngeal erythema.  Eyes:     Extraocular Movements: Extraocular movements intact.     Conjunctiva/sclera: Conjunctivae normal.     Pupils: Pupils are equal, round, and reactive to light.  Cardiovascular:     Rate and Rhythm: Normal rate and regular rhythm.     Heart sounds: No murmur heard. Pulmonary:     Effort: Pulmonary effort is normal. No respiratory distress.     Breath sounds: Normal breath sounds. No wheezing, rhonchi or rales.  Chest:     Chest wall: No tenderness.  Abdominal:     General: Abdomen is flat. There is no distension.     Palpations: Abdomen is soft.     Tenderness: There is no abdominal tenderness. There is no right CVA tenderness, left CVA tenderness, guarding or rebound.  Musculoskeletal:        General: No swelling or tenderness.     Cervical back: Neck supple. No tenderness.     Right lower leg: No edema.     Left lower leg: No edema.  Skin:    General: Skin is warm and dry.     Capillary Refill: Capillary refill takes less than 2 seconds.     Findings: No erythema or rash.  Neurological:      General: No focal deficit present.     Mental Status: She is alert. She is disoriented.     Sensory: No sensory deficit.     Motor: No weakness.  Psychiatric:        Mood and Affect: Mood normal.     ED Results / Procedures / Treatments   Labs (all labs ordered are listed, but only abnormal results are displayed) Labs Reviewed  COMPREHENSIVE METABOLIC PANEL - Abnormal; Notable for the following components:      Result Value   Glucose, Bld 136 (*)    BUN 40 (*)    Creatinine, Ser 1.88 (*)    Alkaline Phosphatase 217 (*)    GFR, Estimated 25 (*)  All other components within normal limits  CBC - Abnormal; Notable for the following components:   RBC 3.56 (*)    Hemoglobin 11.1 (*)    HCT 33.7 (*)    All other components within normal limits  TSH - Abnormal; Notable for the following components:   TSH 0.300 (*)    All other components within normal limits  URINALYSIS, W/ REFLEX TO CULTURE (INFECTION SUSPECTED) - Abnormal; Notable for the following components:   APPearance HAZY (*)    Protein, ur 100 (*)    Nitrite POSITIVE (*)    Leukocytes,Ua LARGE (*)    Bacteria, UA MANY (*)    All other components within normal limits  URINE CULTURE  AMMONIA  LACTIC ACID, PLASMA  LACTIC ACID, PLASMA    EKG EKG Interpretation Date/Time:  Friday April 05 2023 12:46:44 EDT Ventricular Rate:  69 PR Interval:  253 QRS Duration:  174 QT Interval:  452 QTC Calculation: 485 R Axis:   -80  Text Interpretation: Sinus rhythm Prolonged PR interval RBBB and LAFB when compared to prior, some new t wave inversions present. No STEMI Confirmed by Theda Belfast ([id number]) on 04/05/2023 1:31:09 PM  Radiology CT Head Wo Contrast  Result Date: 04/05/2023 CLINICAL DATA:  Delirium EXAM: CT HEAD WITHOUT CONTRAST TECHNIQUE: Contiguous axial images were obtained from the base of the skull through the vertex without intravenous contrast. RADIATION DOSE REDUCTION: This exam was performed according to the  departmental dose-optimization program which includes automated exposure control, adjustment of the mA and/or kV according to patient size and/or use of iterative reconstruction technique. COMPARISON:  07/31/2022 FINDINGS: Brain: No evidence of acute infarction, hemorrhage, hydrocephalus, extra-axial collection or mass lesion/mass effect. Scattered low-density changes within the periventricular and subcortical white matter most compatible with chronic microvascular ischemic change. Mild diffuse cerebral volume loss. Vascular: Atherosclerotic calcifications involving the large vessels of the skull base. No unexpected hyperdense vessel. Skull: Normal. Negative for fracture or focal lesion. Sinuses/Orbits: Opacification of the left sphenoid sinus and posterior left ethmoid air cells. Other: None. IMPRESSION: 1. No acute intracranial findings. 2. Mild chronic microvascular ischemic change and cerebral volume loss. 3. Left sphenoid and ethmoid sinus disease. Electronically Signed   By: Duanne Guess D.O.   On: 04/05/2023 15:08   DG Chest Portable 1 View  Result Date: 04/05/2023 CLINICAL DATA:  Altered mental status EXAM: PORTABLE CHEST 1 VIEW COMPARISON:  X-ray 11/15/2022 FINDINGS: Enlarged cardiopericardial silhouette with calcified aorta. No consolidation, pneumothorax or effusion. No edema. Stable interstitial prominence. Possibly chronic. Overlapping cardiac leads. Surgical clips in the left axillary region. Osteopenia and degenerative changes. Prominent calcifications along the tracheobronchial tree. IMPRESSION: Enlarged heart with a calcified aorta.  Chronic changes. Electronically Signed   By: Karen Kays M.D.   On: 04/05/2023 14:35    Procedures Procedures    Medications Ordered in ED Medications  sodium chloride 0.9 % bolus 500 mL (0 mLs Intravenous Stopped 04/05/23 1405)    ED Course/ Medical Decision Making/ A&P                                 Medical Decision Making Amount and/or  Complexity of Data Reviewed Labs: ordered. Radiology: ordered.  Risk Prescription drug management.    NORINA ABBOT is a 89 y.o. female with a past medical history significant for diabetes, hypertension, GERD, dementia, recurrent urinary tract infections on maintenance nitrofurantoin, heart failure, previous encephalopathy, and seizures  who presents with altered mental status, intermittent delirium, fatigue, increased urination, diarrhea, decreased oral intake.  According to daughter who is here with patient, her last week or so patient has had some mental status changes.  Patient has been having less appetite and has seen more fatigue.  She has not had fevers or chills but they are concerned about recurrent UTI despite her being on maintenance antibiotics.  She has had some increase in urination and they are worried about that.  She has had some diarrhea and families concerned it could be COVID as well.  There is no reported congestion, cough, chest pain, shortness of breath.  Patient is not complaining of any pain in her head, neck, chest, abdomen, or back.  Patient reportedly was crying out and calling for her mother and this seems different than her normal dementia.  Family is concerned she could have a UTI causing this delirium.  They think she is dehydrated and think she needs some fluids.  Otherwise they report no recent medication changes, no trauma, and patient is otherwise resting without complaints.  On exam, lungs clear.  She does have a murmur.  Chest and abdomen nontender.  Good pulses in extremities.  Legs nontender and not critical edematous.  No focal neurologic deficits initially with intact sensation strength and pulses in extremities.  Normal finger-nose-finger testing.  Symmetric smile.  Clear speech.  Pupils are symmetric and reactive with normal extract movements.  Patient had no neck tenderness.  She had a bandage on her chin that she picked at a scrape but otherwise there was no  reported trauma.  No evidence of acute trauma on exam.  Clinically I am concerned about some dehydration with her dry mouth and her appearance.  Will give some fluids and check labs.  Will look for occult infection causing delirium with a chest x-ray and urinalysis.  Will get a urine culture given her history of UTIs.  Will get a CT head given the altered mental status and delirium and will see what workup shows.  If workup reassuring, dissipate discharge home for outpatient follow-up.  Given lack of focal neurologic deficits initially, will hold on MRI at this time.  Family agrees with workup.  3:07 PM Urinalysis does show evidence of UTI.  Rest of labs similar to prior and reassuring.  Family was offered admission for the patient due to the intermittent delirium but they would rather get the antibiotics and go home.  I spoke to pharmacy who recommended Vantin daily for the next 10 days.  She is still waiting on the CT head results but if this is reassuring, she will be discharged home for outpatient follow-up with good return precautions.          Final Clinical Impression(s) / ED Diagnoses Final diagnoses:  Acute cystitis without hematuria  Altered mental status, unspecified altered mental status type    Rx / DC Orders ED Discharge Orders          Ordered    cefpodoxime (VANTIN) 200 MG tablet  Daily        04/05/23 1504           Clinical Impression: 1. Acute cystitis without hematuria   2. Altered mental status, unspecified altered mental status type     Disposition: Discharge  Condition: Good  I have discussed the results, Dx and Tx plan with the pt(& family if present). He/she/they expressed understanding and agree(s) with the plan. Discharge instructions discussed at great length.  Strict return precautions discussed and pt &/or family have verbalized understanding of the instructions. No further questions at time of discharge.    New Prescriptions    CEFPODOXIME (VANTIN) 200 MG TABLET    Take 1 tablet (200 mg total) by mouth daily for 10 days.    Follow Up: Renford Dills, MD [address] [place] [address] [phone]     [facility] Emergency Department at [facility] [address] [place] [address] [address] [phone]       Mickel Schreur, Canary Brim, MD 04/05/23 1520

## 2023-04-06 DIAGNOSIS — R4182 Altered mental status, unspecified: Secondary | ICD-10-CM | POA: Diagnosis not present

## 2023-04-07 LAB — URINE CULTURE: Culture: >=100000 — AB

## 2023-04-08 ENCOUNTER — Ambulatory Visit: Payer: PPO | Admitting: Podiatry

## 2023-04-08 ENCOUNTER — Telehealth (HOSPITAL_BASED_OUTPATIENT_CLINIC_OR_DEPARTMENT_OTHER): Payer: Self-pay | Admitting: *Deleted

## 2023-04-08 NOTE — Telephone Encounter (Signed)
Post ED Visit - Positive Culture Follow-up  Culture report reviewed by antimicrobial stewardship pharmacist: [facility] []  Enzo Bi, Pharm.D. []  Celedonio Miyamoto, Pharm.D., BCPS AQ-ID []  Garvin Fila, Pharm.D., BCPS []  Georgina Pillion, Pharm.D., BCPS []  [place], [address].D., BCPS, AAHIVP []  Estella Husk, Pharm.D., BCPS, AAHIVP []  Lysle Pearl, PharmD, BCPS []  Phillips Climes, PharmD, BCPS []  Agapito Games, PharmD, BCPS []  Verlan Friends, PharmD []  Mervyn Gay, PharmD, BCPS [x]  Verdene Rio, PharmD  [facility] []  Len Childs, PharmD []  Greer Pickerel, PharmD []  Adalberto Cole, PharmD []  Perlie Gold, Rph []  Lonell Face) Jean Rosenthal, PharmD []  Earl Many, PharmD []  Junita Push, PharmD []  Dorna Leitz, PharmD []  Terrilee Files, PharmD []  Lynann Beaver, PharmD []  Keturah Barre, PharmD []  Loralee Pacas, PharmD []  Bernadene Person, PharmD   Positive urine culture Treated with Cefpodoxime Proxetil, organism sensitive to the same and no further patient follow-up is required at this time.  Patsey Berthold 04/08/2023, 9:10 AM

## 2023-04-10 ENCOUNTER — Emergency Department (HOSPITAL_COMMUNITY): Payer: PPO

## 2023-04-10 ENCOUNTER — Inpatient Hospital Stay (HOSPITAL_COMMUNITY)
Admission: EM | Admit: 2023-04-10 | Discharge: 2023-04-12 | DRG: 389 | Disposition: A | Discharge status: Home / Self Care | Payer: PPO | Attending: Internal Medicine | Admitting: Internal Medicine

## 2023-04-10 ENCOUNTER — Encounter (HOSPITAL_COMMUNITY): Payer: Self-pay

## 2023-04-10 ENCOUNTER — Inpatient Hospital Stay (HOSPITAL_COMMUNITY): Payer: PPO

## 2023-04-10 ENCOUNTER — Other Ambulatory Visit: Payer: Self-pay

## 2023-04-10 DIAGNOSIS — Z79899 Other long term (current) drug therapy: Secondary | ICD-10-CM

## 2023-04-10 DIAGNOSIS — I272 Pulmonary hypertension, unspecified: Secondary | ICD-10-CM | POA: Diagnosis not present

## 2023-04-10 DIAGNOSIS — R4182 Altered mental status, unspecified: Secondary | ICD-10-CM | POA: Diagnosis not present

## 2023-04-10 DIAGNOSIS — G40909 Epilepsy, unspecified, not intractable, without status epilepticus: Secondary | ICD-10-CM | POA: Diagnosis not present

## 2023-04-10 DIAGNOSIS — N390 Urinary tract infection, site not specified: Secondary | ICD-10-CM | POA: Diagnosis not present

## 2023-04-10 DIAGNOSIS — Z91048 Other nonmedicinal substance allergy status: Secondary | ICD-10-CM | POA: Diagnosis not present

## 2023-04-10 DIAGNOSIS — Z8781 Personal history of (healed) traumatic fracture: Secondary | ICD-10-CM

## 2023-04-10 DIAGNOSIS — Z66 Do not resuscitate: Secondary | ICD-10-CM | POA: Diagnosis not present

## 2023-04-10 DIAGNOSIS — K8689 Other specified diseases of pancreas: Secondary | ICD-10-CM | POA: Diagnosis not present

## 2023-04-10 DIAGNOSIS — F0390 Unspecified dementia without behavioral disturbance: Secondary | ICD-10-CM | POA: Diagnosis present

## 2023-04-10 DIAGNOSIS — I10 Essential (primary) hypertension: Secondary | ICD-10-CM | POA: Diagnosis not present

## 2023-04-10 DIAGNOSIS — N184 Chronic kidney disease, stage 4 (severe): Secondary | ICD-10-CM | POA: Diagnosis present

## 2023-04-10 DIAGNOSIS — Z833 Family history of diabetes mellitus: Secondary | ICD-10-CM

## 2023-04-10 DIAGNOSIS — K56609 Unspecified intestinal obstruction, unspecified as to partial versus complete obstruction: Secondary | ICD-10-CM

## 2023-04-10 DIAGNOSIS — K5651 Intestinal adhesions [bands], with partial obstruction: Secondary | ICD-10-CM | POA: Diagnosis not present

## 2023-04-10 DIAGNOSIS — E876 Hypokalemia: Secondary | ICD-10-CM | POA: Diagnosis not present

## 2023-04-10 DIAGNOSIS — Z90710 Acquired absence of both cervix and uterus: Secondary | ICD-10-CM

## 2023-04-10 DIAGNOSIS — I130 Hypertensive heart and chronic kidney disease with heart failure and stage 1 through stage 4 chronic kidney disease, or unspecified chronic kidney disease: Secondary | ICD-10-CM | POA: Diagnosis present

## 2023-04-10 DIAGNOSIS — Z888 Allergy status to other drugs, medicaments and biological substances status: Secondary | ICD-10-CM | POA: Diagnosis not present

## 2023-04-10 DIAGNOSIS — G4489 Other headache syndrome: Secondary | ICD-10-CM | POA: Diagnosis not present

## 2023-04-10 DIAGNOSIS — I4510 Unspecified right bundle-branch block: Secondary | ICD-10-CM | POA: Diagnosis not present

## 2023-04-10 DIAGNOSIS — H409 Unspecified glaucoma: Secondary | ICD-10-CM | POA: Diagnosis not present

## 2023-04-10 DIAGNOSIS — N3000 Acute cystitis without hematuria: Principal | ICD-10-CM

## 2023-04-10 DIAGNOSIS — Z91040 Latex allergy status: Secondary | ICD-10-CM

## 2023-04-10 DIAGNOSIS — I5032 Chronic diastolic (congestive) heart failure: Secondary | ICD-10-CM | POA: Diagnosis present

## 2023-04-10 DIAGNOSIS — Z87891 Personal history of nicotine dependence: Secondary | ICD-10-CM

## 2023-04-10 DIAGNOSIS — E1122 Type 2 diabetes mellitus with diabetic chronic kidney disease: Secondary | ICD-10-CM | POA: Diagnosis present

## 2023-04-10 DIAGNOSIS — K219 Gastro-esophageal reflux disease without esophagitis: Secondary | ICD-10-CM | POA: Diagnosis not present

## 2023-04-10 DIAGNOSIS — Z9049 Acquired absence of other specified parts of digestive tract: Secondary | ICD-10-CM

## 2023-04-10 DIAGNOSIS — B961 Klebsiella pneumoniae [K. pneumoniae] as the cause of diseases classified elsewhere: Secondary | ICD-10-CM | POA: Diagnosis not present

## 2023-04-10 DIAGNOSIS — Z87440 Personal history of urinary (tract) infections: Secondary | ICD-10-CM

## 2023-04-10 DIAGNOSIS — Z853 Personal history of malignant neoplasm of breast: Secondary | ICD-10-CM

## 2023-04-10 DIAGNOSIS — E118 Type 2 diabetes mellitus with unspecified complications: Secondary | ICD-10-CM | POA: Diagnosis present

## 2023-04-10 DIAGNOSIS — Z923 Personal history of irradiation: Secondary | ICD-10-CM

## 2023-04-10 DIAGNOSIS — Z885 Allergy status to narcotic agent status: Secondary | ICD-10-CM | POA: Diagnosis not present

## 2023-04-10 DIAGNOSIS — R109 Unspecified abdominal pain: Secondary | ICD-10-CM | POA: Diagnosis present

## 2023-04-10 DIAGNOSIS — R1084 Generalized abdominal pain: Secondary | ICD-10-CM | POA: Diagnosis not present

## 2023-04-10 DIAGNOSIS — K5669 Other partial intestinal obstruction: Secondary | ICD-10-CM | POA: Diagnosis not present

## 2023-04-10 DIAGNOSIS — R0689 Other abnormalities of breathing: Secondary | ICD-10-CM | POA: Diagnosis not present

## 2023-04-10 LAB — CBC WITH DIFFERENTIAL/PLATELET
Abs Immature Granulocytes: 0.01 10*3/uL (ref 0.00–0.07)
Basophils Absolute: 0 10*3/uL (ref 0.0–0.1)
Basophils Relative: 0 %
Eosinophils Absolute: 0.1 10*3/uL (ref 0.0–0.5)
Eosinophils Relative: 2 %
HCT: 39.6 % (ref 36.0–46.0)
Hemoglobin: 12.8 g/dL (ref 12.0–15.0)
Immature Granulocytes: 0 %
Lymphocytes Relative: 16 %
Lymphs Abs: 0.9 10*3/uL (ref 0.7–4.0)
MCH: 30 pg (ref 26.0–34.0)
MCHC: 32.3 g/dL (ref 30.0–36.0)
MCV: 93 fL (ref 80.0–100.0)
Monocytes Absolute: 0.4 10*3/uL (ref 0.1–1.0)
Monocytes Relative: 7 %
Neutro Abs: 4.5 10*3/uL (ref 1.7–7.7)
Neutrophils Relative %: 75 %
Platelets: 249 10*3/uL (ref 150–400)
RBC: 4.26 MIL/uL (ref 3.87–5.11)
RDW: 15.2 % (ref 11.5–15.5)
WBC: 5.9 10*3/uL (ref 4.0–10.5)
nRBC: 0 % (ref 0.0–0.2)

## 2023-04-10 LAB — COMPREHENSIVE METABOLIC PANEL
ALT: 26 U/L (ref 0–44)
AST: 43 U/L — ABNORMAL HIGH (ref 15–41)
Albumin: 3.6 g/dL (ref 3.5–5.0)
Alkaline Phosphatase: 223 U/L — ABNORMAL HIGH (ref 38–126)
Anion gap: 13 (ref 5–15)
BUN: 31 mg/dL — ABNORMAL HIGH (ref 8–23)
CO2: 27 mmol/L (ref 22–32)
Calcium: 10.1 mg/dL (ref 8.9–10.3)
Chloride: 96 mmol/L — ABNORMAL LOW (ref 98–111)
Creatinine, Ser: 1.73 mg/dL — ABNORMAL HIGH (ref 0.44–1.00)
GFR, Estimated: 27 mL/min — ABNORMAL LOW (ref >60)
Glucose, Bld: 139 mg/dL — ABNORMAL HIGH (ref 70–99)
Potassium: 4.1 mmol/L (ref 3.5–5.1)
Sodium: 136 mmol/L (ref 135–145)
Total Bilirubin: 0.9 mg/dL (ref 0.3–1.2)
Total Protein: 8.1 g/dL (ref 6.5–8.1)

## 2023-04-10 LAB — GLUCOSE, CAPILLARY
Glucose-Capillary: 103 mg/dL — ABNORMAL HIGH (ref 70–99)
Glucose-Capillary: 98 mg/dL (ref 70–99)
Glucose-Capillary: 97 mg/dL (ref 70–99)

## 2023-04-10 LAB — URINALYSIS, W/ REFLEX TO CULTURE (INFECTION SUSPECTED)
Bacteria, UA: NONE SEEN
Bilirubin Urine: NEGATIVE
Glucose, UA: NEGATIVE mg/dL
Hgb urine dipstick: NEGATIVE
Ketones, ur: NEGATIVE mg/dL
Leukocytes,Ua: NEGATIVE
Nitrite: NEGATIVE
Protein, ur: 100 mg/dL — AB
Specific Gravity, Urine: 1.016 (ref 1.005–1.030)
pH: 5 (ref 5.0–8.0)

## 2023-04-10 LAB — MAGNESIUM: Magnesium: 2.2 mg/dL (ref 1.7–2.4)

## 2023-04-10 LAB — LIPASE, BLOOD: Lipase: 31 U/L (ref 11–51)

## 2023-04-10 MED ORDER — ALBUTEROL SULFATE (2.5 MG/3ML) 0.083% IN NEBU
2.5000 mg | INHALATION_SOLUTION | Freq: Four times a day (QID) | RESPIRATORY_TRACT | Status: DC
  Administered 2023-04-11 (×3): 2.5 mg via RESPIRATORY_TRACT
  Filled 2023-04-10 (×3): qty 3

## 2023-04-10 MED ORDER — ONDANSETRON HCL 4 MG/2ML IJ SOLN: 4.0000 mg | Freq: Four times a day (QID) | INTRAMUSCULAR | Status: DC | PRN

## 2023-04-10 MED ORDER — HYDRALAZINE HCL 20 MG/ML IJ SOLN
10.0000 mg | Freq: Four times a day (QID) | INTRAMUSCULAR | Status: DC | PRN
  Administered 2023-04-10: 10 mg via INTRAVENOUS
  Filled 2023-04-10: qty 1

## 2023-04-10 MED ORDER — PANTOPRAZOLE SODIUM 40 MG IV SOLR
40.0000 mg | INTRAVENOUS | Status: DC
  Administered 2023-04-10 – 2023-04-11 (×2): 40 mg via INTRAVENOUS
  Filled 2023-04-10 (×3): qty 10

## 2023-04-10 MED ORDER — ENOXAPARIN SODIUM 30 MG/0.3ML IJ SOSY
30.0000 mg | PREFILLED_SYRINGE | INTRAMUSCULAR | Status: DC
  Administered 2023-04-10 – 2023-04-12 (×3): 30 mg via SUBCUTANEOUS
  Filled 2023-04-10 (×3): qty 0.3

## 2023-04-10 MED ORDER — SODIUM CHLORIDE 0.9 % IV BOLUS
500.0000 mL | Freq: Once | INTRAVENOUS | Status: AC
  Administered 2023-04-10: 500 mL via INTRAVENOUS

## 2023-04-10 MED ORDER — LEVETIRACETAM IN NACL 500 MG/100ML IV SOLN
500.0000 mg | Freq: Two times a day (BID) | INTRAVENOUS | Status: DC
  Administered 2023-04-10 – 2023-04-11 (×3): 500 mg via INTRAVENOUS
  Filled 2023-04-10 (×3): qty 100

## 2023-04-10 MED ORDER — ALBUTEROL SULFATE (2.5 MG/3ML) 0.083% IN NEBU
2.5000 mg | INHALATION_SOLUTION | Freq: Four times a day (QID) | RESPIRATORY_TRACT | Status: DC
  Administered 2023-04-10: 2.5 mg via RESPIRATORY_TRACT
  Filled 2023-04-10: qty 3

## 2023-04-10 MED ORDER — ACETAMINOPHEN 325 MG PO TABS: 650.0000 mg | ORAL_TABLET | Freq: Four times a day (QID) | ORAL | Status: DC | PRN

## 2023-04-10 MED ORDER — ACETAMINOPHEN 650 MG RE SUPP: 650.0000 mg | Freq: Four times a day (QID) | RECTAL | Status: DC | PRN

## 2023-04-10 MED ORDER — HYDRALAZINE HCL 20 MG/ML IJ SOLN
10.0000 mg | INTRAMUSCULAR | Status: DC | PRN
  Administered 2023-04-11: 10 mg via INTRAVENOUS
  Filled 2023-04-10: qty 1

## 2023-04-10 MED ORDER — DIATRIZOATE MEGLUMINE & SODIUM 66-10 % PO SOLN
90.0000 mL | Freq: Once | ORAL | Status: AC
  Administered 2023-04-10: 90 mL via NASOGASTRIC
  Filled 2023-04-10 (×2): qty 90

## 2023-04-10 MED ORDER — PROCHLORPERAZINE EDISYLATE 10 MG/2ML IJ SOLN
5.0000 mg | Freq: Once | INTRAMUSCULAR | Status: AC
  Administered 2023-04-10: 5 mg via INTRAVENOUS
  Filled 2023-04-10: qty 2

## 2023-04-10 MED ORDER — SODIUM CHLORIDE 0.9 % IV SOLN: INTRAVENOUS | Status: DC

## 2023-04-10 MED ORDER — METOPROLOL TARTRATE 5 MG/5ML IV SOLN: 2.5000 mg | Freq: Four times a day (QID) | INTRAVENOUS | Status: DC | PRN

## 2023-04-10 MED ORDER — LORAZEPAM 2 MG/ML IJ SOLN
0.5000 mg | Freq: Once | INTRAMUSCULAR | Status: AC
  Administered 2023-04-10: 0.5 mg via INTRAVENOUS
  Filled 2023-04-10: qty 1

## 2023-04-10 MED ORDER — SODIUM CHLORIDE 0.9 % IV SOLN
1.0000 g | Freq: Every day | INTRAVENOUS | Status: AC
  Administered 2023-04-10 – 2023-04-12 (×3): 1 g via INTRAVENOUS
  Filled 2023-04-10 (×3): qty 10

## 2023-04-10 MED ORDER — MORPHINE SULFATE (PF) 2 MG/ML IV SOLN: 1.0000 mg | INTRAVENOUS | Status: DC | PRN

## 2023-04-10 MED ORDER — INSULIN ASPART 100 UNIT/ML IJ SOLN: 0.0000 [IU] | INTRAMUSCULAR | Status: DC

## 2023-04-10 NOTE — ED Notes (Signed)
Attempted to placed NG tube multiple times without success

## 2023-04-10 NOTE — Consult Note (Addendum)
[name] 11/04/1928  [id number].    Requesting MD: Laura Corn, MD Chief Complaint/Reason for Consult: SBO  HPI:  [name] is a 89 y/o F  who presents with abdominal pain. The patients daughter, whom she lives with, is at bedside assisting with history. Daughter reports that the patient started complaining of abdominal pain last night and later developed bilious vomiting around 10 PM. The patient told her daughter that she had a BM yesterday morning, but is is unclear if she did have a BM yesterday. Patient and daughter both remember a substantial bowel movement on Monday 9/9. Patient denies abdominal pain or nausea at present.   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 Daughter reports that after her femur fracture she had a steep decline in her medical condition, she was discharged to SNF and then admitted for sepsis related to bacteremia due to obstructive uropathy.  She then had CHF exacerbations and severe pulmonary HTN, and ultimately went home with palliative services in April of this this year, however since April of this year her daughter reports she has shown significant improvement. She tells me that the patient walks with a walker and in independent of ADLs, even wore high heels to church on Sunday.   At baseline she lives with her daughter here in [place] and has resided with her permanently since 2018.  She is not on blood thinners.  She does not have a cardiologist currently.    ROS: Review of Systems  All other systems reviewed  and are negative.   Family History  Problem Relation Age of Onset   Diabetes Sister     Past Medical History:  Diagnosis Date   Dementia (HCC)    Diabetes mellitus    GERD (gastroesophageal reflux disease)    Glaucoma    Heart disease    Hypertension    Tinnitus     Past Surgical History:  Procedure Laterality Date   BREAST LUMPECTOMY     bil for breast ca   ORIF FEMUR FRACTURE Right 07/03/2022   Procedure: OPEN REDUCTION INTERNAL FIXATION (ORIF) DISTAL FEMUR FRACTURE;  Surgeon: Myrene Galas, MD;  Location: [facility] OR;  Service: Orthopedics;  Laterality: Right;    Social History:  reports that she has quit smoking. She has never used smokeless tobacco. She reports that she does not drink alcohol and does not use drugs.  Allergies:  Allergies  Allergen Reactions   Ace Inhibitors Swelling, Rash and Cough   Prandin [Repaglinide] Other (See Comments)    Caused significant peripheral edema   Hydromorphone Itching, Other (See Comments) and Rash    Other Reaction: pruritis, Hallucinations (auditory and visual)   Dilaudid [Hydromorphone Hcl] Itching and Other (See Comments)    Hallucinations (auditory and visual) also   Latex Rash   Other Nausea And Vomiting and Rash    "Tussionex Pennkinetic ER"  Prefers PAPER TAPE, PLEASE!!   Tape Rash    Prefers PAPER TAPE, PLEASE!!    (Not in a hospital admission)    Physical Exam: Blood pressure (!) 174/87, pulse (!) 110, temperature 99.5 F (37.5 C),  temperature source Oral, resp. rate (!) 25, SpO2 98%. General: Pleasant elderly female laying on hospital bed, appears younger than stated age, NAD. HEENT: head -normocephalic, atraumatic; Eyes: PERRLA, no conjunctival injection, anicteric sclerae Neck- Trachea is midline  CV- RRR on the monitor, palpable DP pulses, compression socks in place with no lower extremity edema  Pulm- breathing is non-labored ORA Abd- soft, mild TTP without rebound or guarding, previous lower midline  laparotomy scar appears well healing, R subcostal scar from open chole appears well healing, RLQ scar appears well healing, No palpable hernias or masses. No HSM.  GU- deferred  MSK- UE/LE symmetrical, no cyanosis, clubbing, or edema. Neuro- non-focal exam Psych- Alert and Oriented x3 with appropriate affect Skin: warm and dry, no rashes or lesions   Results for orders placed or performed during the hospital encounter of 04/10/23 (from the past 48 hour(s))  CBC with Differential     Status: None   Collection Time: 04/10/23  6:45 AM  Result Value Ref Range   WBC 5.9 4.0 - 10.5 K/uL   RBC 4.26 3.87 - 5.11 MIL/uL   Hemoglobin 12.8 12.0 - 15.0 g/dL   HCT 78.4 69.6 - 29.5 %   MCV 93.0 80.0 - 100.0 fL   MCH 30.0 26.0 - 34.0 pg   MCHC 32.3 30.0 - 36.0 g/dL   RDW 28.4 13.2 - 44.0 %   Platelets 249 150 - 400 K/uL   nRBC 0.0 0.0 - 0.2 %   Neutrophils Relative % 75 %   Neutro Abs 4.5 1.7 - 7.7 K/uL   Lymphocytes Relative 16 %   Lymphs Abs 0.9 0.7 - 4.0 K/uL   Monocytes Relative 7 %   Monocytes Absolute 0.4 0.1 - 1.0 K/uL   Eosinophils Relative 2 %   Eosinophils Absolute 0.1 0.0 - 0.5 K/uL   Basophils Relative 0 %   Basophils Absolute 0.0 0.0 - 0.1 K/uL   Immature Granulocytes 0 %   Abs Immature Granulocytes 0.01 0.00 - 0.07 K/uL    Comment: Performed at [facility], [address], [place], [place] [address]  Comprehensive metabolic panel     Status: Abnormal   Collection Time: 04/10/23  6:45 AM  Result Value Ref Range   Sodium 136 135 - 145 mmol/L   Potassium 4.1 3.5 - 5.1 mmol/L    Comment: HEMOLYSIS AT THIS LEVEL MAY AFFECT RESULT   Chloride 96 (L) 98 - 111 mmol/L   CO2 27 22 - 32 mmol/L   Glucose, Bld 139 (H) 70 - 99 mg/dL    Comment: Glucose reference range applies only to samples taken after fasting for at least 8 hours.   BUN 31 (H) 8 - 23 mg/dL   Creatinine, Ser 5.36 (H) 0.44 - 1.00 mg/dL   Calcium 64.4 8.9 - 03.4 mg/dL   Total Protein 8.1 6.5 - 8.1 g/dL   Albumin  3.6 3.5 - 5.0 g/dL   AST 43 (H) 15 - 41 U/L    Comment: HEMOLYSIS AT THIS LEVEL MAY AFFECT RESULT   ALT 26 0 - 44 U/L    Comment: HEMOLYSIS AT THIS LEVEL MAY AFFECT RESULT   Alkaline Phosphatase 223 (H) 38 - 126 U/L   Total Bilirubin 0.9 0.3 - 1.2 mg/dL    Comment: HEMOLYSIS AT THIS LEVEL MAY AFFECT RESULT   GFR, Estimated 27 (L) >60 mL/min    Comment: (NOTE) Calculated using the CKD-EPI Creatinine Equation (2021)    Anion gap 13 5 -  15    Comment: Performed at [facility], [address], [place], [place] [address]  Lipase, blood     Status: None   Collection Time: 04/10/23  6:45 AM  Result Value Ref Range   Lipase 31 11 - 51 U/L    Comment: Performed at [facility], [address] [place], [place] [address]  Urinalysis, w/ Reflex to Culture (Infection Suspected) -Urine, Clean Catch     Status: Abnormal   Collection Time: 04/10/23  7:52 AM  Result Value Ref Range   Specimen Source URINE, CLEAN CATCH    Color, Urine YELLOW YELLOW   APPearance CLEAR CLEAR   Specific Gravity, Urine 1.016 1.005 - 1.030   pH 5.0 5.0 - 8.0   Glucose, UA NEGATIVE NEGATIVE mg/dL   Hgb urine dipstick NEGATIVE NEGATIVE   Bilirubin Urine NEGATIVE NEGATIVE   Ketones, ur NEGATIVE NEGATIVE mg/dL   Protein, ur 841 (A) NEGATIVE mg/dL   Nitrite NEGATIVE NEGATIVE   Leukocytes,Ua NEGATIVE NEGATIVE   RBC / HPF 0-5 0 - 5 RBC/hpf   WBC, UA 0-5 0 - 5 WBC/hpf    Comment:        Reflex urine culture not performed if WBC <=10, OR if Squamous epithelial cells >5. If Squamous epithelial cells >5 suggest recollection.    Bacteria, UA NONE SEEN NONE SEEN   Squamous Epithelial / HPF 0-5 0 - 5 /HPF   Mucus PRESENT     Comment: Performed at [facility], [address] [place], [place] [address]   CT ABDOMEN PELVIS WO CONTRAST  Result Date: 04/10/2023 CLINICAL DATA:  89 year old female with left lower quadrant abdominal pain. Vomiting. Recent UTI. EXAM: CT ABDOMEN AND PELVIS WITHOUT CONTRAST  TECHNIQUE: Multidetector CT imaging of the abdomen and pelvis was performed following the standard protocol without IV contrast. RADIATION DOSE REDUCTION: This exam was performed according to the departmental dose-optimization program which includes automated exposure control, adjustment of the mA and/or kV according to patient size and/or use of iterative reconstruction technique. COMPARISON:  Noncontrast CT Abdomen and Pelvis 07/31/2022 and earlier. FINDINGS: Lower chest: Borderline to mild cardiomegaly. Calcified coronary artery atherosclerosis or stents. No pericardial effusion. Calcified aortic atherosclerosis. Negative lung bases. Hepatobiliary: Negative noncontrast liver. Diminutive or absent gallbladder. Pancreas: Pancreatic atrophy. Stable mild pancreatic ductal enlargement. Spleen: Diminutive, negative. Adrenals/Urinary Tract: Negative adrenal glands. Nonobstructed kidneys and ureters. Diminutive bladder although prominent gas within the urinary bladder non dependently on sagittal image 63. Chronic exophytic left renal cyst appears stable with mild rim calcification and otherwise internal simple fluid density (no follow-up imaging recommended). Stomach/Bowel: Mostly decompressed large bowel throughout the abdomen. Mild retained stool in the right colon and the rectum. No large bowel inflammation. Distal small bowel loops in the right lower quadrant are decompressed including the terminal ileum. Proximal small bowel loops and the stomach are fluid distended. Similar distension of the proximal duodenum. Fluid distended chronic small bowel anastomosis in the midline near the pelvic inlet. And there is a fairly abrupt transition point which seems to be just upstream of the anastomosis best demonstrated on coronal image 56. Upstream fluid-filled small bowel loops are dilated up to 37 mm. No free air. No free fluid. Vascular/Lymphatic: Aortoiliac calcified atherosclerosis. Normal caliber abdominal aorta.  Vascular patency is not evaluated in the absence of IV contrast. No lymphadenopathy. Reproductive: Surgically absent uterus. Diminutive or absent ovaries. Other: No pelvis free fluid. Musculoskeletal: Stable visualized osseous structures. Occasional mild spinal compression fractures  appear stable, most notably L4. 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 No acute osseous abnormality identified. IMPRESSION: 1. Small Bowel Obstruction, and fluid dilated stomach. Transition point appears to be near a chronic small bowel anastomosis in the midline abdomen, coronal image 56. Favor postoperative adhesions. No evidence of perforation. 2. 3. Gas within the urinary bladder, suspicious for gas-forming UTI unless explained by recent catheterization. 4.  Aortic Atherosclerosis (ICD10-I70.0). Electronically Signed   By: Odessa Fleming M.D.   On: 04/10/2023 07:30      Assessment/Plan SBO with history of multiple abdominal surgeries. Transition zone appears to be at the level of a small bowel anastomosis. Based on the fecalization of the proximal small bowel loops I suspect this is somewhat chronic, and hopefully a partial obstruction. Agree with NG tube placement and initiation of SBO protocol. There is no emergent need for surgery today. She is hemodynamically stable without peritonitis or signs of intestinal ischemia. If the patient fails to improve with non-operative measures then she would need cardiac evaluation/optimization prior to surgery and we would need to have another GOC discussion. Patients daughter tells me that their philosophy is that there are some things that you cannot fix, you just have to live with, but if something is fixable, they would want to fix it, even if that means abdominal surgery.   FEN - NPO, IVF per primary, NG to LIWS VTE - SCD's, lovenox ID - none curently Admit - TRH service  Code status - DNR/DNI   Below per primary team --   ?UTI/cystitis Diastolic CHF, pulmonary HTN GERD DM2 HTN Seizure disorder Dementia    I reviewed nursing notes, ED provider notes, hospitalist notes, last 24 h vitals and pain scores, last 48 h intake and output, last 24 h labs and trends, and last 24 h imaging results.  Adam Phenix, [facility] 04/10/2023, 9:22 AM Please see Amion for pager number during day hours 7:00am-4:30pm or 7:00am -11:30am on weekends

## 2023-04-10 NOTE — ED Notes (Signed)
ED TO INPATIENT HANDOFF REPORT  ED Nurse Name and Phone #: [place] [phone]  S Name/Age/Gender [name] 89 y.o. female Room/Bed: RESUSC/RESUSC  Code Status   Code Status: Limited: Do not attempt resuscitation (DNR) -DNR-LIMITED -Do Not Intubate/DNI   Home/SNF/Other Home Patient oriented to: self, dementia at baseline Is this baseline? Yes   Triage Complete: Triage complete  Chief Complaint SBO (small bowel obstruction) (HCC) [K56.609]  Triage Note Pt BIB [facility] from home with c/o stomach pain and vomiting since 2300 last night. Around 0400 this morning, pt woke up with a headache. Recently at [facility] for UTI. GCS 14 at baseline.    EMS Vitals: Temp 98.84F BP 180/90 HR 110 RR 30 O2 94% room air ETCO2 30 CBG 153   Allergies Allergies  Allergen Reactions   Ace Inhibitors Swelling, Rash and Cough   Prandin [Repaglinide] Other (See Comments)    Caused significant peripheral edema   Hydromorphone Itching, Other (See Comments) and Rash    Other Reaction: pruritis, Hallucinations (auditory and visual)   Dilaudid [Hydromorphone Hcl] Itching and Other (See Comments)    Hallucinations (auditory and visual) also   Latex Rash   Other Nausea And Vomiting and Rash    "Tussionex Pennkinetic ER"  Prefers PAPER TAPE, PLEASE!!   Tape Rash    Prefers PAPER TAPE, PLEASE!!    Level of Care/Admitting Diagnosis ED Disposition     ED Disposition  Admit   Condition  --   Comment  Hospital Area: [facility] [[id number]]  Level of Care: Telemetry Medical [104]  May admit patient to [facility] or [facility] if equivalent level of care is available:: Yes  Covid Evaluation: Asymptomatic - no recent exposure (last 10 days) testing not required  Diagnosis: SBO (small bowel obstruction) [facility]) [[id number]]  Admitting Physician: Lorin Glass [[id number]]  Attending Physician: Lorin Glass [[id number]]  Certification:: I certify this patient will need inpatient services for at  least 2 midnights  Expected Medical Readiness: 04/13/2023          B Medical/Surgery History Past Medical History:  Diagnosis Date   Dementia (HCC)    Diabetes mellitus    GERD (gastroesophageal reflux disease)    Glaucoma    Heart disease    Hypertension    Tinnitus    Past Surgical History:  Procedure Laterality Date   BREAST LUMPECTOMY     bil for breast ca   ORIF FEMUR FRACTURE Right 07/03/2022   Procedure: OPEN REDUCTION INTERNAL FIXATION (ORIF) DISTAL FEMUR FRACTURE;  Surgeon: Myrene Galas, MD;  Location: [facility] OR;  Service: Orthopedics;  Laterality: Right;     A IV Location/Drains/Wounds Patient Lines/Drains/Airways Status     Active Line/Drains/Airways     Name Placement date Placement time Site Days   Peripheral IV 04/10/23 20 G Anterior;Distal;Left;Upper Arm 04/10/23  0815  Arm  less than 1            Intake/Output Last 24 hours No intake or output data in the 24 hours ending 04/10/23 0920  Labs/Imaging Results for orders placed or performed during the hospital encounter of 04/10/23 (from the past 48 hour(s))  CBC with Differential     Status: None   Collection Time: 04/10/23  6:45 AM  Result Value Ref Range   WBC 5.9 4.0 - 10.5 K/uL   RBC 4.26 3.87 - 5.11 MIL/uL   Hemoglobin 12.8 12.0 - 15.0 g/dL   HCT 27.2 53.6 - 64.4 %   MCV 93.0 80.0 -  100.0 fL   MCH 30.0 26.0 - 34.0 pg   MCHC 32.3 30.0 - 36.0 g/dL   RDW 86.5 78.4 - 69.6 %   Platelets 249 150 - 400 K/uL   nRBC 0.0 0.0 - 0.2 %   Neutrophils Relative % 75 %   Neutro Abs 4.5 1.7 - 7.7 K/uL   Lymphocytes Relative 16 %   Lymphs Abs 0.9 0.7 - 4.0 K/uL   Monocytes Relative 7 %   Monocytes Absolute 0.4 0.1 - 1.0 K/uL   Eosinophils Relative 2 %   Eosinophils Absolute 0.1 0.0 - 0.5 K/uL   Basophils Relative 0 %   Basophils Absolute 0.0 0.0 - 0.1 K/uL   Immature Granulocytes 0 %   Abs Immature Granulocytes 0.01 0.00 - 0.07 K/uL    Comment: Performed at [facility], [address]  [place], [place] [address]  Comprehensive metabolic panel     Status: Abnormal   Collection Time: 04/10/23  6:45 AM  Result Value Ref Range   Sodium 136 135 - 145 mmol/L   Potassium 4.1 3.5 - 5.1 mmol/L    Comment: HEMOLYSIS AT THIS LEVEL MAY AFFECT RESULT   Chloride 96 (L) 98 - 111 mmol/L   CO2 27 22 - 32 mmol/L   Glucose, Bld 139 (H) 70 - 99 mg/dL    Comment: Glucose reference range applies only to samples taken after fasting for at least 8 hours.   BUN 31 (H) 8 - 23 mg/dL   Creatinine, Ser 4.13 (H) 0.44 - 1.00 mg/dL   Calcium 24.4 8.9 - 01.0 mg/dL   Total Protein 8.1 6.5 - 8.1 g/dL   Albumin 3.6 3.5 - 5.0 g/dL   AST 43 (H) 15 - 41 U/L    Comment: HEMOLYSIS AT THIS LEVEL MAY AFFECT RESULT   ALT 26 0 - 44 U/L    Comment: HEMOLYSIS AT THIS LEVEL MAY AFFECT RESULT   Alkaline Phosphatase 223 (H) 38 - 126 U/L   Total Bilirubin 0.9 0.3 - 1.2 mg/dL    Comment: HEMOLYSIS AT THIS LEVEL MAY AFFECT RESULT   GFR, Estimated 27 (L) >60 mL/min    Comment: (NOTE) Calculated using the CKD-EPI Creatinine Equation (2021)    Anion gap 13 5 - 15    Comment: Performed at [facility], [address] [place], [place] [address]  Lipase, blood     Status: None   Collection Time: 04/10/23  6:45 AM  Result Value Ref Range   Lipase 31 11 - 51 U/L    Comment: Performed at [facility], [address], [place], [place] [address]  Urinalysis, w/ Reflex to Culture (Infection Suspected) -Urine, Clean Catch     Status: Abnormal   Collection Time: 04/10/23  7:52 AM  Result Value Ref Range   Specimen Source URINE, CLEAN CATCH    Color, Urine YELLOW YELLOW   APPearance CLEAR CLEAR   Specific Gravity, Urine 1.016 1.005 - 1.030   pH 5.0 5.0 - 8.0   Glucose, UA NEGATIVE NEGATIVE mg/dL   Hgb urine dipstick NEGATIVE NEGATIVE   Bilirubin Urine NEGATIVE NEGATIVE   Ketones, ur NEGATIVE NEGATIVE mg/dL   Protein, ur 347 (A) NEGATIVE mg/dL   Nitrite NEGATIVE NEGATIVE   Leukocytes,Ua NEGATIVE NEGATIVE   RBC  / HPF 0-5 0 - 5 RBC/hpf   WBC, UA 0-5 0 - 5 WBC/hpf    Comment:        Reflex urine culture not performed  if WBC <=10, OR if Squamous epithelial cells >5. If Squamous epithelial cells >5 suggest recollection.    Bacteria, UA NONE SEEN NONE SEEN   Squamous Epithelial / HPF 0-5 0 - 5 /HPF   Mucus PRESENT     Comment: Performed at [facility], [address] [place], [place] [address]   CT ABDOMEN PELVIS WO CONTRAST  Result Date: 04/10/2023 CLINICAL DATA:  89 year old female with left lower quadrant abdominal pain. Vomiting. Recent UTI. EXAM: CT ABDOMEN AND PELVIS WITHOUT CONTRAST TECHNIQUE: Multidetector CT imaging of the abdomen and pelvis was performed following the standard protocol without IV contrast. RADIATION DOSE REDUCTION: This exam was performed according to the departmental dose-optimization program which includes automated exposure control, adjustment of the mA and/or kV according to patient size and/or use of iterative reconstruction technique. COMPARISON:  Noncontrast CT Abdomen and Pelvis 07/31/2022 and earlier. FINDINGS: Lower chest: Borderline to mild cardiomegaly. Calcified coronary artery atherosclerosis or stents. No pericardial effusion. Calcified aortic atherosclerosis. Negative lung bases. Hepatobiliary: Negative noncontrast liver. Diminutive or absent gallbladder. Pancreas: Pancreatic atrophy. Stable mild pancreatic ductal enlargement. Spleen: Diminutive, negative. Adrenals/Urinary Tract: Negative adrenal glands. Nonobstructed kidneys and ureters. Diminutive bladder although prominent gas within the urinary bladder non dependently on sagittal image 63. Chronic exophytic left renal cyst appears stable with mild rim calcification and otherwise internal simple fluid density (no follow-up imaging recommended). Stomach/Bowel: Mostly decompressed large bowel throughout the abdomen. Mild retained stool in the right colon and the rectum. No large bowel inflammation. Distal small  bowel loops in the right lower quadrant are decompressed including the terminal ileum. Proximal small bowel loops and the stomach are fluid distended. Similar distension of the proximal duodenum. Fluid distended chronic small bowel anastomosis in the midline near the pelvic inlet. And there is a fairly abrupt transition point which seems to be just upstream of the anastomosis best demonstrated on coronal image 56. Upstream fluid-filled small bowel loops are dilated up to 37 mm. No free air. No free fluid. Vascular/Lymphatic: Aortoiliac calcified atherosclerosis. Normal caliber abdominal aorta. Vascular patency is not evaluated in the absence of IV contrast. No lymphadenopathy. Reproductive: Surgically absent uterus. Diminutive or absent ovaries. Other: No pelvis free fluid. Musculoskeletal: Stable visualized osseous structures. Occasional mild spinal compression fractures appear stable, most notably L4. 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 No acute osseous abnormality identified. IMPRESSION: 1. Small Bowel Obstruction, and fluid dilated stomach. Transition point appears to be near a chronic small bowel anastomosis in the midline abdomen, coronal image 56. Favor postoperative adhesions. No evidence of perforation. 2. 3. Gas within the urinary bladder, suspicious for gas-forming UTI unless explained by recent catheterization. 4.  Aortic Atherosclerosis (ICD10-I70.0). Electronically Signed   By: Odessa Fleming M.D.   On: 04/10/2023 07:30    Pending Labs Unresulted Labs (From admission, onward)     Start     Ordered   04/11/23 0500  Basic metabolic panel  Tomorrow morning,   R        04/10/23 0840   04/11/23 0500  CBC  Tomorrow morning,   R        04/10/23 0840   04/10/23 0839  Hemoglobin A1c  Add-on,   AD       Comments: To assess prior glycemic control    04/10/23 0840   04/10/23 0745  Magnesium  Once,   STAT        04/10/23 0744  Vitals/Pain Today's Vitals   04/10/23 0555 04/10/23 0557 04/10/23 0748  BP:  (!) 193/96 (!) 174/87  Pulse:  100 (!) 110  Resp:  (!) 36 (!) 25  Temp:  99.5 F (37.5 C)   TempSrc:  Oral   SpO2:  96% 98%  PainSc: 0-No pain      Isolation Precautions No active isolations  Medications Medications  diatrizoate meglumine-sodium (GASTROGRAFIN) 66-10 % solution 90 mL (has no administration in time range)  insulin aspart (novoLOG) injection 0-9 Units (has no administration in time range)  acetaminophen (TYLENOL) tablet 650 mg (has no administration in time range)    Or  acetaminophen (TYLENOL) suppository 650 mg (has no administration in time range)  albuterol (PROVENTIL) (2.5 MG/3ML) 0.083% nebulizer solution 2.5 mg (has no administration in time range)  hydrALAZINE (APRESOLINE) injection 10 mg (has no administration in time range)  enoxaparin (LOVENOX) injection 30 mg (has no administration in time range)  0.9 %  sodium chloride infusion (has no administration in time range)  morphine (PF) 2 MG/ML injection 1 mg (has no administration in time range)  levETIRAcetam (KEPPRA) IVPB 500 mg/100 mL premix (has no administration in time range)  pantoprazole (PROTONIX) injection 40 mg (has no administration in time range)  ondansetron (ZOFRAN) injection 4 mg (has no administration in time range)  prochlorperazine (COMPAZINE) injection 5 mg (5 mg Intravenous Given 04/10/23 0815)  sodium chloride 0.9 % bolus 500 mL (0 mLs Intravenous Stopped 04/10/23 0915)  LORazepam (ATIVAN) injection 0.5 mg (0.5 mg Intravenous Given 04/10/23 0901)    Mobility walks with device     Focused Assessments GI   R Recommendations: See Admitting Provider Note  Report given to:   Additional Notes: SBO, NG tube

## 2023-04-10 NOTE — ED Provider Triage Note (Signed)
Emergency Medicine Provider Triage Evaluation Note  [name] , a 89 y.o. female  was evaluated in triage.  Pt complains of abdominal pain.  Recently diagnosed with UTI and has been taking antibiotics.  Began complaining of abdominal pain yesterday evening after dinner, vomited around 10 PM.  Transiently better until this morning when she began complaining of worsening pain and vomited again after trying to drink some ginger ale.  Daughter denies any fever.  She is at baseline mental status.  Patient denies any pain during exam, states she feels better.  Review of Systems  Positive: Abdominal pain, vomiting Negative: fever  Physical Exam  BP (!) 193/96 (BP Location: Right Arm)   Pulse 100   Temp 99.5 F (37.5 C) (Oral)   Resp (!) 36   SpO2 96%  Gen:   Awake, no distress   Resp:  Normal effort  MSK:   Moves extremities without difficulty  Other:  Very mildly tender along lower abdomen with very deep palpation, no peritoneal signs, no apparent distention  Medical Decision Making  Medically screening exam initiated at 6:08 AM.  Appropriate orders placed.  [name] was informed that the remainder of the evaluation will be completed by another provider, this initial triage assessment does not replace that evaluation, and the importance of remaining in the ED until their evaluation is complete.  Abdominal pain, vomiting.  Patient denies any active pain, states she feels better.  She does have very mild tenderness with deep palpation along the lower abdomen.  Vitals are stable.  Not actively vomiting.  Will obtain labs, CT w/out contrast (last GFR ~25).     Garlon Hatchet, PA-C 04/10/23 [phone]

## 2023-04-10 NOTE — ED Provider Notes (Signed)
[facility] AT [facility] Provider Note   CSN: [id number] Arrival date & time: 04/10/23  0546     History  Chief Complaint  Patient presents with   Abdominal Pain    [name] is a 89 y.o. female with a past medical history significant for hypertension, history of dementia, diabetes, CHF, pulmonary hypertension who presents to the ED due to abdominal pain associated with 2 episodes of nonbloody emesis.  Per daughter, emesis light green in color. Daughter at bedside provided history.  Daughter states she has had 2 episodes of vomiting so far.  Patient was in her normal state of health yesterday until last night. Currently being treated for UTI (on day 4 of antibiotics). No fever or chills. No diarrhea. Last BM yesterday morning.  Denies chest pain and shortness of breath.  Previous cholecystectomy, hysterectomy, and C-section. Last ate early this morning between 2-4AM (apple sauce and ginger ale). Daughter confirmed DNR status.   History obtained from patient and past medical records. No interpreter used during encounter.       Home Medications Prior to Admission medications   Medication Sig Start Date End Date Taking? Authorizing Provider  acetaminophen (TYLENOL) 500 MG tablet Take 2 tablets (1,000 mg total) by mouth every 8 (eight) hours as needed for mild pain or moderate pain. Patient taking differently: Take 1,000 mg by mouth 2 (two) times daily as needed for mild pain or moderate pain. 07/25/22   Ghimire, Werner Lean, MD  allopurinol (ZYLOPRIM) 100 MG tablet Take 100 mg by mouth 2 (two) times daily.    [provider]  amLODipine (NORVASC) 10 MG tablet Take 10 mg by mouth daily.    [provider]  blood glucose meter kit and supplies KIT Dispense based on patient and insurance preference. Use up to four times daily as directed. (FOR ICD-9 250.00, 250.01). For QAC - HS accuchecks. 11/27/20   Leroy Sea, MD  cefpodoxime (VANTIN)  200 MG tablet Take 1 tablet (200 mg total) by mouth daily for 10 days. 04/05/23 04/15/23  Tegeler, Canary Brim, MD  Continuous Blood Gluc Sensor (FREESTYLE LIBRE 2 SENSOR) MISC USE AS DIRECTED. REAPPLY EVERY 14 DAYS Patient taking differently: Inject 1 Device into the skin every 14 (fourteen) days. 11/20/21   Romero Belling, MD  dronabinol (MARINOL) 2.5 MG capsule Take 1 capsule (2.5 mg total) by mouth 2 (two) times daily before lunch and supper. 11/25/22   Zannie Cove, MD  furosemide (LASIX) 40 MG tablet Take 40 mg by mouth daily. 08/31/22   [provider]  gabapentin (NEURONTIN) 100 MG capsule Take 100 mg by mouth 2 (two) times daily.    [provider]  HYDRALAZINE HCL PO Take 1 tablet by mouth 3 (three) times daily.    [provider]  isosorbide mononitrate (IMDUR) 30 MG 24 hr tablet Take 0.5 tablets (15 mg total) by mouth daily. 11/25/22   Zannie Cove, MD  levETIRAcetam (KEPPRA) 500 MG tablet Take 500 mg by mouth 2 (two) times daily.    [provider]  metoprolol tartrate (LOPRESSOR) 100 MG tablet Take 0.5 tablets (50 mg total) by mouth 2 (two) times daily. 11/25/22   Zannie Cove, MD  mirtazapine (REMERON) 15 MG tablet Take 15 mg by mouth at bedtime.     [provider]  Multiple Vitamins-Minerals (CENTRUM SILVER 50+WOMEN) TABS Take 1 tablet by mouth daily with breakfast.    [provider]  omeprazole (PRILOSEC) 20 MG  capsule Take 20 mg by mouth every Monday, Wednesday, and Friday.    [provider]  QUEtiapine (SEROQUEL) 25 MG tablet Take 25 mg by mouth daily. 08/31/22   [provider]      Allergies    Ace inhibitors, Prandin [repaglinide], Hydromorphone, Dilaudid [hydromorphone hcl], Latex, Other, and Tape    Review of Systems   Review of Systems  Constitutional:  Negative for fever.  Respiratory:  Negative for shortness of breath.   Cardiovascular:  Negative for chest pain.  Gastrointestinal:  Positive  for abdominal pain, nausea and vomiting. Negative for diarrhea.  Genitourinary:  Negative for dysuria.    Physical Exam Updated Vital Signs BP (!) 174/87 (BP Location: Right Arm)   Pulse (!) 110   Temp 99.5 F (37.5 C) (Oral)   Resp (!) 25   SpO2 98%  Physical Exam Vitals and nursing note reviewed.  Constitutional:      General: She is not in acute distress.    Appearance: She is not ill-appearing.  HENT:     Head: Normocephalic.  Eyes:     Pupils: Pupils are equal, round, and reactive to light.  Cardiovascular:     Rate and Rhythm: Normal rate and regular rhythm.     Pulses: Normal pulses.     Heart sounds: Normal heart sounds. No murmur heard.    No friction rub. No gallop.  Pulmonary:     Effort: Pulmonary effort is normal.     Breath sounds: Normal breath sounds.  Abdominal:     General: Abdomen is flat. There is no distension.     Palpations: Abdomen is soft.     Tenderness: There is abdominal tenderness. There is no guarding or rebound.     Comments: Diffuse tenderness.   Musculoskeletal:        General: Normal range of motion.     Cervical back: Neck supple.  Skin:    General: Skin is warm and dry.  Neurological:     General: No focal deficit present.     Mental Status: She is alert.  Psychiatric:        Mood and Affect: Mood normal.        Behavior: Behavior normal.     ED Results / Procedures / Treatments   Labs (all labs ordered are listed, but only abnormal results are displayed) Labs Reviewed  COMPREHENSIVE METABOLIC PANEL - Abnormal; Notable for the following components:      Result Value   Chloride 96 (*)    Glucose, Bld 139 (*)    BUN 31 (*)    Creatinine, Ser 1.73 (*)    AST 43 (*)    Alkaline Phosphatase 223 (*)    GFR, Estimated 27 (*)    All other components within normal limits  URINALYSIS, W/ REFLEX TO CULTURE (INFECTION SUSPECTED) - Abnormal; Notable for the following components:   Protein, ur 100 (*)    All other components within  normal limits  CBC WITH DIFFERENTIAL/PLATELET  LIPASE, BLOOD  MAGNESIUM  HEMOGLOBIN A1C    EKG None  Radiology CT ABDOMEN PELVIS WO CONTRAST  Result Date: 04/10/2023 CLINICAL DATA:  89 year old female with left lower quadrant abdominal pain. Vomiting. Recent UTI. EXAM: CT ABDOMEN AND PELVIS WITHOUT CONTRAST TECHNIQUE: Multidetector CT imaging of the abdomen and pelvis was performed following the standard protocol without IV contrast. RADIATION DOSE REDUCTION: This exam was performed according to the departmental dose-optimization program which includes automated exposure control, adjustment of the mA  and/or kV according to patient size and/or use of iterative reconstruction technique. COMPARISON:  Noncontrast CT Abdomen and Pelvis 07/31/2022 and earlier. FINDINGS: Lower chest: Borderline to mild cardiomegaly. Calcified coronary artery atherosclerosis or stents. No pericardial effusion. Calcified aortic atherosclerosis. Negative lung bases. Hepatobiliary: Negative noncontrast liver. Diminutive or absent gallbladder. Pancreas: Pancreatic atrophy. Stable mild pancreatic ductal enlargement. Spleen: Diminutive, negative. Adrenals/Urinary Tract: Negative adrenal glands. Nonobstructed kidneys and ureters. Diminutive bladder although prominent gas within the urinary bladder non dependently on sagittal image 63. Chronic exophytic left renal cyst appears stable with mild rim calcification and otherwise internal simple fluid density (no follow-up imaging recommended). Stomach/Bowel: Mostly decompressed large bowel throughout the abdomen. Mild retained stool in the right colon and the rectum. No large bowel inflammation. Distal small bowel loops in the right lower quadrant are decompressed including the terminal ileum. Proximal small bowel loops and the stomach are fluid distended. Similar distension of the proximal duodenum. Fluid distended chronic small bowel anastomosis in the midline near the pelvic inlet. And  there is a fairly abrupt transition point which seems to be just upstream of the anastomosis best demonstrated on coronal image 56. Upstream fluid-filled small bowel loops are dilated up to 37 mm. No free air. No free fluid. Vascular/Lymphatic: Aortoiliac calcified atherosclerosis. Normal caliber abdominal aorta. Vascular patency is not evaluated in the absence of IV contrast. No lymphadenopathy. Reproductive: Surgically absent uterus. Diminutive or absent ovaries. Other: No pelvis free fluid. Musculoskeletal: Stable visualized osseous structures. Occasional mild spinal compression fractures appear stable, most notably L4. 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 No acute osseous abnormality identified. IMPRESSION: 1. Small Bowel Obstruction, and fluid dilated stomach. Transition point appears to be near a chronic small bowel anastomosis in the midline abdomen, coronal image 56. Favor postoperative adhesions. No evidence of perforation. 2. 3. Gas within the urinary bladder, suspicious for gas-forming UTI unless explained by recent catheterization. 4.  Aortic Atherosclerosis (ICD10-I70.0). Electronically Signed   By: Odessa Fleming M.D.   On: 04/10/2023 07:30    Procedures .Critical Care  Performed by: Mannie Stabile, PA-C Authorized by: Mannie Stabile, PA-C   Critical care provider statement:    Critical care time (minutes):  31   Critical care was necessary to treat or prevent imminent or life-threatening deterioration of the following conditions:  Metabolic crisis   Critical care was time spent personally by me on the following activities:  Development of treatment plan with patient or surrogate, discussions with consultants, evaluation of patient's response to treatment, examination of patient, ordering and review of laboratory studies, ordering and review of radiographic studies, ordering and performing treatments and interventions, pulse oximetry,  re-evaluation of patient's condition and review of old charts   I assumed direction of critical care for this patient from another provider in my specialty: no     Care discussed with: admitting provider       Medications Ordered in ED Medications  diatrizoate meglumine-sodium (GASTROGRAFIN) 66-10 % solution 90 mL (has no administration in time range)  insulin aspart (novoLOG) injection 0-9 Units (has no administration in time range)  acetaminophen (TYLENOL) tablet 650 mg (has no administration in time range)    Or  acetaminophen (TYLENOL) suppository 650 mg (has no administration in time range)  albuterol (PROVENTIL) (2.5 MG/3ML) 0.083% nebulizer solution 2.5 mg (has no administration in time range)  hydrALAZINE (APRESOLINE) injection 10 mg (has no administration in time range)  enoxaparin (LOVENOX) injection 40 mg (has  no administration in time range)  0.9 %  sodium chloride infusion (has no administration in time range)  morphine (PF) 2 MG/ML injection 1 mg (has no administration in time range)  prochlorperazine (COMPAZINE) injection 5 mg (5 mg Intravenous Given 04/10/23 0815)  sodium chloride 0.9 % bolus 500 mL (500 mLs Intravenous New Bag/Given 04/10/23 0815)    ED Course/ Medical Decision Making/ A&P Clinical Course as of 04/10/23 0841  Wed Apr 10, 2023  0801 WBC: 5.9 [CA]    Clinical Course User Index [CA] Mannie Stabile, PA-C                                 Medical Decision Making Amount and/or Complexity of Data Reviewed Independent Historian: caregiver External Data Reviewed: notes. Labs: ordered. Decision-making details documented in ED Course. Radiology: ordered and independent interpretation performed. Decision-making details documented in ED Course. ECG/medicine tests: ordered and independent interpretation performed. Decision-making details documented in ED Course.  Risk Prescription drug management.   This patient presents to the ED for concern of  abdominal pain + N/V, this involves an extensive number of treatment options, and is a complaint that carries with it a high risk of complications and morbidity.  The differential diagnosis includes gastroenteritis, bowel obstruction, pancreatitis, constipation, infection, etc  89 year old female presents to the ED due to abdominal pain associated with nausea and vomiting that started last night around 10 PM.  Currently being treated for UTI on day 4 of antibiotics (Vantin).  Lives with daughter.  Last bowel movement yesterday morning.  No fever or chills.  No chest pain or shortness of breath.  Upon arrival, patient afebrile, not tachycardic or hypoxic.  Patient tachypneic.  During initial evaluation patient slightly tachycardic which daughter attributes to anxiety.  No recent fever or chills.  Abdomen soft, nondistended with diffuse tenderness.  Routine labs ordered.  CT abdomen. IVFs and compazine given.  CBC unremarkable.  No leukocytosis.  Normal hemoglobin.  Lipase normal.  Low suspicion for pancreatitis.  CMP significant for elevated creatinine 1.73 and BUN at 31 which appears to be around patient's baseline.  Alk phos elevated to 23.  AST at 43.  CT abdomen personally reviewed and interpreted which demonstrates a small bowel obstruction.  NG tube placed.  Also demonstrates a gas-forming UTI.  Patient currently being treated with Vantin.  Urine culture positive for Klebsiella  Spoke to Tresa Endo, [place] with general surgery who will come see patient. Recommends admission. Bowel obstruction orders placed by Tresa Endo.   8:40 AM Discussed with Dr. Pola Corn with [facility] who agrees to admit patient.   Lives at home with daughter Hx CHF Has PCP  Discussed with Dr. Eloise Harman who evaluated patient at bedside and agrees with assessment and plan.       Final Clinical Impression(s) / ED Diagnoses Final diagnoses:  Small bowel obstruction (HCC)  Acute cystitis without hematuria    Rx / DC Orders ED Discharge  Orders     None         Jesusita Oka 04/10/23 0843    Rondel Baton, MD 04/11/23 [phone]

## 2023-04-10 NOTE — ED Triage Notes (Signed)
Pt BIB [facility] from home with c/o stomach pain and vomiting since 2300 last night. Around 0400 this morning, pt woke up with a headache. Recently at [facility] for UTI. GCS 14 at baseline.    EMS Vitals: Temp 98.48F BP 180/90 HR 110 RR 30 O2 94% room air ETCO2 30 CBG 153

## 2023-04-10 NOTE — H&P (Signed)
[facility] History and Physical  LEGACIE DETORE WJX:[id number] DOB: Apr 19, 1929 DOA: 04/10/2023 PCP: Renford Dills, MD  Admitted from: Home Chief Complaint: Abdominal pain  History of Present Illness: [name] is a 89 y.o. female with PMH significant for DM2, HTN, CHF, CKD, GERD, seizure disorder on Keppra, breast cancer status post lumpectomy, radiation, tamoxifen. Also has surgical history of C-section, hysterectomy, open cholecystectomy and appendicectomy. Patient was brought to the ED this morning by EMS with complaint of abdominal pain and multiple episodes of vomiting Patient started complaining of abdominal pain since last night and later developed bilious vomiting around 10 PM.  Last reported bowel movement was yesterday morning.  Of note, patient has had decline in her physical and mental health since her hip fracture in 2023.  She underwent ORIF and was discharged to SNF.  She subsequently had more hospitalizations after that with sepsis secondary to bacteremia and obstructive uropathy and later with CHF exacerbations.  April 2024, she was discharged home with palliative services.  She has been gradually improving at home.  Currently at baseline, she is independent and able to walk with a walker. She was recently seen in the ED on 9/6 for UTI and was discharged on Vantin.   In the ED, patient was afebrile, heart rate 110, blood pressure 193/96, breathing on room air Labs with unremarkable CBC, BMP with BUN/creatinine 31/1.73, alk phos elevated to 223 with mild AST elevation of 43 and normal ALT and bilirubin Urinalysis unremarkable CT abdomen and pelvis showed evidence of small bowel obstruction with fluid dilated stomach.  Transition point appears to be near a chronic small bowel anastomosis in the midline abdomen likely due to postoperative adhesions. No evidence of perforation.  General surgery was consulted Hospitalist service consulted for inpatient admission and  management.  At the time of my evaluation, patient was sitting up in bed.  She was slightly distressed because NG tube insertion was tried multiple times and failed. Daughter was at bedside. History confirmed and revised as above Her daughter is a Ship broker and has realistic expectations.  She confirmed DNR/DNI status.   Review of Systems:  All systems were reviewed and were negative unless otherwise mentioned in the HPI   Past medical history: Past Medical History:  Diagnosis Date   Dementia (HCC)    Diabetes mellitus    GERD (gastroesophageal reflux disease)    Glaucoma    Heart disease    Hypertension    Tinnitus     Past surgical history: Past Surgical History:  Procedure Laterality Date   BREAST LUMPECTOMY     bil for breast ca   ORIF FEMUR FRACTURE Right 07/03/2022   Procedure: OPEN REDUCTION INTERNAL FIXATION (ORIF) DISTAL FEMUR FRACTURE;  Surgeon: Myrene Galas, MD;  Location: [facility] OR;  Service: Orthopedics;  Laterality: Right;    Social History:  reports that she has quit smoking. She has never used smokeless tobacco. She reports that she does not drink alcohol and does not use drugs.  Allergies:  Allergies  Allergen Reactions   Ace Inhibitors Swelling, Rash and Cough   Prandin [Repaglinide] Other (See Comments)    Caused significant peripheral edema   Hydromorphone Itching, Other (See Comments) and Rash    Other Reaction: pruritis, Hallucinations (auditory and visual)   Dilaudid [Hydromorphone Hcl] Itching and Other (See Comments)    Hallucinations (auditory and visual) also   Latex Rash   Other Nausea And Vomiting and Rash    "Tussionex Pennkinetic ER"  Prefers  PAPER TAPE, PLEASE!!   Tape Rash    Prefers PAPER TAPE, PLEASE!!   Ace inhibitors, Prandin [repaglinide], Hydromorphone, Dilaudid [hydromorphone hcl], Latex, Other, and Tape   Family history:  Family History  Problem Relation Age of Onset   Diabetes Sister      Physical Exam: Vitals:    04/10/23 0748 04/10/23 0948 04/10/23 1004 04/10/23 1008  BP: (!) 174/87   (!) 178/81  Pulse: (!) 110   98  Resp: (!) 25   16  Temp:  99.5 F (37.5 C)  98.9 F (37.2 C)  TempSrc:  Oral  Oral  SpO2: 98%   95%  Weight:   62.9 kg   Height:   5\' 4"  (1.626 m)    Wt Readings from Last 3 Encounters:  04/10/23 62.9 kg  04/05/23 68 kg  11/25/22 67.6 kg   Body mass index is 23.8 kg/m.  General exam: Pleasant elderly African-American female.  Skin: No rashes, lesions or ulcers. HEENT: Atraumatic, normocephalic, no obvious bleeding Lungs: Clear to auscultation bilaterally CVS: Regular rate and rhythm, no murmur GI/Abd soft, mild generalized tenderness, bowel sound sluggish CNS: Alert, awake, able to answer simple questions Psychiatry: Mood appropriate Extremities: No pedal edema, no calf tenderness   ------------------------------------------------------------------------------------------------------ Assessment/Plan: Principal Problem:   SBO (small bowel obstruction) (HCC) Active Problems:   Chronic diastolic CHF (congestive heart failure) (HCC)   Type 2 diabetes mellitus with complication, without long-term current use of insulin (HCC)   Hypertension  Small bowel obstruction H/o multiple abdominal surgeries Present with abdomen pain, multiple episodes of vomiting since last night CT abdomen as above showing small bowel obstruction likely due to adhesions. General surgery was consulted. Conservative management recommended with NG decompression, IV fluid, IV analgesics, IV antiemetics NG tube insertion failed despite multiple attempts in the ED.  Defer to general surgery for further planning on this. Start normal saline at 100 mL/h, IV Protonix, IV Zofran, IV morphine as needed  Recent Klebsiella UTI 9/6, seen at [facility] for UTI and was discharged on Vantin.  Urine culture subsequently grew Klebsiella.   CT abdomen today showed gas within the urinary bladder, suspicious  for gas-forming UTI I would continue antibiotics.  Switch to IV Rocephin based on sensitivity.  Type 2 diabetes mellitus A1c 6.1 on April 2024 PTA meds-I do not see any antidiabetic meds at home list Start for now SSI/Accu-Cheks Lab Results  Component Value Date   HGBA1C 6.1 (H) 11/15/2022   No results for input(s): "GLUCAP" in the last 168 hours.  H/o severe pulmonary hypertension HTN Blood pressure elevated significantly this morning probably from the stress of symptoms as well as NG tube insertion Most recent echo from April 2025 with EF 60 to 65%, severely elevated PASP to 69 PTA meds- med list not obtained yet.  It seems patient was not metoprolol, Lasix 40 mg daily, Imdur 30 mg daily, hydralazine 3 times daily, amlodipine I would keep oral meds on hold and order for PRN IV metoprolol for heart rate and PRN IV hydralazine for blood pressure  CKD Creatinine remains at baseline.  Continue to monitor Recent Labs    11/18/22 0100 11/19/22 0053 11/20/22 0745 11/21/22 0855 11/22/22 0052 11/23/22 0058 11/24/22 0048 11/25/22 0939 04/05/23 1235 04/10/23 0645  BUN 43* 45* 48* 50* 52* 50* 42* 39* 40* 31*  CREATININE 2.75* 2.84* 3.03* 2.94* 2.89* 2.55* 1.99* 2.25* 1.88* 1.73*  CO2 22 22 21* 21* 20* 20* 23 23 29 27     H/o seizure  disorder Patient daughter states that she had 1 episode of seizure 30 years ago and has been on Keppra since then.  She has had thoughts of discontinuing Keppra but not discussed that with PCP or neurologist.   I ordered Keppra to be given IV for now.  H/o breast cancer  status post lumpectomy, radiation, tamoxifen.  Mobility: Encourage ambulation.  May benefit from PT eval once medically improved  Goals of care   Code Status: Limited: Do not attempt resuscitation (DNR) -DNR-LIMITED -Do Not Intubate/DNI .  I discussed this with patient and her daughter at bedside   DVT prophylaxis:  enoxaparin (LOVENOX) injection 30 mg Start: 04/10/23 1000    Antimicrobials: IV Ancef Fluid: NS at 100 mL/h Consultants: Surgery Family Communication: Daughter at bedside  Dispo: The patient is from: Home              Anticipated d/c is to: Pending clinical course  Diet: Diet Order             Diet NPO time specified  Diet effective now                    ------------------------------------------------------------------------------------- Severity of Illness: The appropriate patient status for this patient is INPATIENT. Inpatient status is judged to be reasonable and necessary in order to provide the required intensity of service to ensure the patient's safety. The patient's presenting symptoms, physical exam findings, and initial radiographic and laboratory data in the context of their chronic comorbidities is felt to place them at high risk for further clinical deterioration. Furthermore, it is not anticipated that the patient will be medically stable for discharge from the hospital within 2 midnights of admission.   * I certify that at the point of admission it is my clinical judgment that the patient will require inpatient hospital care spanning beyond 2 midnights from the point of admission due to high intensity of service, high risk for further deterioration and high frequency of surveillance required.*   ------------------------------------------------------------------------------------- Home Meds: Prior to Admission medications   Medication Sig Start Date End Date Taking? Authorizing Provider  acetaminophen (TYLENOL) 500 MG tablet Take 2 tablets (1,000 mg total) by mouth every 8 (eight) hours as needed for mild pain or moderate pain. Patient taking differently: Take 1,000 mg by mouth 2 (two) times daily as needed for mild pain or moderate pain. 07/25/22   Ghimire, Werner Lean, MD  allopurinol (ZYLOPRIM) 100 MG tablet Take 100 mg by mouth 2 (two) times daily.    [provider]  amLODipine (NORVASC) 10 MG tablet Take 10 mg  by mouth daily.    [provider]  blood glucose meter kit and supplies KIT Dispense based on patient and insurance preference. Use up to four times daily as directed. (FOR ICD-9 250.00, 250.01). For QAC - HS accuchecks. 11/27/20   Leroy Sea, MD  cefpodoxime (VANTIN) 200 MG tablet Take 1 tablet (200 mg total) by mouth daily for 10 days. 04/05/23 04/15/23  Tegeler, Canary Brim, MD  Continuous Blood Gluc Sensor (FREESTYLE LIBRE 2 SENSOR) MISC USE AS DIRECTED. REAPPLY EVERY 14 DAYS Patient taking differently: Inject 1 Device into the skin every 14 (fourteen) days. 11/20/21   Romero Belling, MD  dronabinol (MARINOL) 2.5 MG capsule Take 1 capsule (2.5 mg total) by mouth 2 (two) times daily before lunch and supper. 11/25/22   Zannie Cove, MD  furosemide (LASIX) 40 MG tablet Take 40 mg by mouth daily. 08/31/22   [provider]  gabapentin (NEURONTIN) 100 MG capsule Take 100 mg by mouth 2 (two) times daily.    [provider]  HYDRALAZINE HCL PO Take 1 tablet by mouth 3 (three) times daily.    [provider]  isosorbide mononitrate (IMDUR) 30 MG 24 hr tablet Take 0.5 tablets (15 mg total) by mouth daily. 11/25/22   Zannie Cove, MD  levETIRAcetam (KEPPRA) 500 MG tablet Take 500 mg by mouth 2 (two) times daily.    [provider]  metoprolol tartrate (LOPRESSOR) 100 MG tablet Take 0.5 tablets (50 mg total) by mouth 2 (two) times daily. 11/25/22   Zannie Cove, MD  mirtazapine (REMERON) 15 MG tablet Take 15 mg by mouth at bedtime.     [provider]  Multiple Vitamins-Minerals (CENTRUM SILVER 50+WOMEN) TABS Take 1 tablet by mouth daily with breakfast.    [provider]  omeprazole (PRILOSEC) 20 MG capsule Take 20 mg by mouth every Monday, Wednesday, and Friday.    [provider]  QUEtiapine (SEROQUEL) 25 MG tablet Take 25 mg by mouth daily. 08/31/22   [provider]    Labs on Admission:   CBC: Recent Labs  Lab  04/05/23 1235 04/10/23 0645  WBC 4.6 5.9  NEUTROABS  --  4.5  HGB 11.1* 12.8  HCT 33.7* 39.6  MCV 94.7 93.0  PLT 208 249    Basic Metabolic Panel: Recent Labs  Lab 04/05/23 1235 04/10/23 0645  NA 136 136  K 4.4 4.1  CL 101 96*  CO2 29 27  GLUCOSE 136* 139*  BUN 40* 31*  CREATININE 1.88* 1.73*  CALCIUM 9.6 10.1    Liver Function Tests: Recent Labs  Lab 04/05/23 1235 04/10/23 0645  AST 30 43*  ALT 23 26  ALKPHOS 217* 223*  BILITOT 0.3 0.9  PROT 7.0 8.1  ALBUMIN 3.7 3.6   Recent Labs  Lab 04/10/23 0645  LIPASE 31   Recent Labs  Lab 04/05/23 1235  AMMONIA 18    Cardiac Enzymes: No results for input(s): "CKTOTAL", "CKMB", "CKMBINDEX", "TROPONINI" in the last 168 hours.  BNP (last 3 results) Recent Labs    06/30/22 1906 07/03/22 0225 11/15/22 1130  BNP 103.8* 309.9* 1,969.7*    ProBNP (last 3 results) No results for input(s): "PROBNP" in the last 8760 hours.  CBG: No results for input(s): "GLUCAP" in the last 168 hours.  Lipase     Component Value Date/Time   LIPASE 31 04/10/2023 0645     Urinalysis    Component Value Date/Time   COLORURINE YELLOW 04/10/2023 0752   APPEARANCEUR CLEAR 04/10/2023 0752   LABSPEC 1.016 04/10/2023 0752   PHURINE 5.0 04/10/2023 0752   GLUCOSEU NEGATIVE 04/10/2023 0752   HGBUR NEGATIVE 04/10/2023 0752   BILIRUBINUR NEGATIVE 04/10/2023 0752   KETONESUR NEGATIVE 04/10/2023 0752   PROTEINUR 100 (A) 04/10/2023 0752   UROBILINOGEN 0.2 11/21/2008 1820   NITRITE NEGATIVE 04/10/2023 0752   LEUKOCYTESUR NEGATIVE 04/10/2023 0752     Drugs of Abuse     Component Value Date/Time   LABOPIA NONE DETECTED 11/24/2020 1715   COCAINSCRNUR NONE DETECTED 11/24/2020 1715   LABBENZ NONE DETECTED 11/24/2020 1715   AMPHETMU NONE DETECTED 11/24/2020 1715   THCU NONE DETECTED 11/24/2020 1715   LABBARB NONE DETECTED 11/24/2020 1715      Radiological Exams on Admission: CT ABDOMEN PELVIS WO CONTRAST  Result Date:  04/10/2023 CLINICAL DATA:  89 year old female with left lower quadrant abdominal pain. Vomiting. Recent UTI. EXAM: CT  ABDOMEN AND PELVIS WITHOUT CONTRAST TECHNIQUE: Multidetector CT imaging of the abdomen and pelvis was performed following the standard protocol without IV contrast. RADIATION DOSE REDUCTION: This exam was performed according to the departmental dose-optimization program which includes automated exposure control, adjustment of the mA and/or kV according to patient size and/or use of iterative reconstruction technique. COMPARISON:  Noncontrast CT Abdomen and Pelvis 07/31/2022 and earlier. FINDINGS: Lower chest: Borderline to mild cardiomegaly. Calcified coronary artery atherosclerosis or stents. No pericardial effusion. Calcified aortic atherosclerosis. Negative lung bases. Hepatobiliary: Negative noncontrast liver. Diminutive or absent gallbladder. Pancreas: Pancreatic atrophy. Stable mild pancreatic ductal enlargement. Spleen: Diminutive, negative. Adrenals/Urinary Tract: Negative adrenal glands. Nonobstructed kidneys and ureters. Diminutive bladder although prominent gas within the urinary bladder non dependently on sagittal image 63. Chronic exophytic left renal cyst appears stable with mild rim calcification and otherwise internal simple fluid density (no follow-up imaging recommended). Stomach/Bowel: Mostly decompressed large bowel throughout the abdomen. Mild retained stool in the right colon and the rectum. No large bowel inflammation. Distal small bowel loops in the right lower quadrant are decompressed including the terminal ileum. Proximal small bowel loops and the stomach are fluid distended. Similar distension of the proximal duodenum. Fluid distended chronic small bowel anastomosis in the midline near the pelvic inlet. And there is a fairly abrupt transition point which seems to be just upstream of the anastomosis best demonstrated on coronal image 56. Upstream fluid-filled small bowel  loops are dilated up to 37 mm. No free air. No free fluid. Vascular/Lymphatic: Aortoiliac calcified atherosclerosis. Normal caliber abdominal aorta. Vascular patency is not evaluated in the absence of IV contrast. No lymphadenopathy. Reproductive: Surgically absent uterus. Diminutive or absent ovaries. Other: No pelvis free fluid. Musculoskeletal: Stable visualized osseous structures. Occasional mild spinal compression fractures appear stable, most notably L4. 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 No acute osseous abnormality identified. IMPRESSION: 1. Small Bowel Obstruction, and fluid dilated stomach. Transition point appears to be near a chronic small bowel anastomosis in the midline abdomen, coronal image 56. Favor postoperative adhesions. No evidence of perforation. 2. 3. Gas within the urinary bladder, suspicious for gas-forming UTI unless explained by recent catheterization. 4.  Aortic Atherosclerosis (ICD10-I70.0). Electronically Signed   By: Odessa Fleming M.D.   On: 04/10/2023 07:30     Signed, Lorin Glass, MD [facility] 04/10/2023

## 2023-04-10 NOTE — Progress Notes (Signed)
[facility] [id number] [facility] Liaison note:  This patient is currently enrolled in [facility] outpatient-based palliative care. Hospital Liaison will continue to follow for discharge disposition. Please call for any outpatient based palliative care related questions or concerns. Thank you, Thea Gist BSN, [facility] liaison [phone]

## 2023-04-11 DIAGNOSIS — K56609 Unspecified intestinal obstruction, unspecified as to partial versus complete obstruction: Secondary | ICD-10-CM | POA: Diagnosis not present

## 2023-04-11 LAB — GLUCOSE, CAPILLARY
Glucose-Capillary: 81 mg/dL (ref 70–99)
Glucose-Capillary: 83 mg/dL (ref 70–99)
Glucose-Capillary: 81 mg/dL (ref 70–99)
Glucose-Capillary: 97 mg/dL (ref 70–99)
Glucose-Capillary: 123 mg/dL — ABNORMAL HIGH (ref 70–99)
Glucose-Capillary: 105 mg/dL — ABNORMAL HIGH (ref 70–99)
Glucose-Capillary: 88 mg/dL (ref 70–99)

## 2023-04-11 LAB — CBC
HCT: 31.3 % — ABNORMAL LOW (ref 36.0–46.0)
Hemoglobin: 10.1 g/dL — ABNORMAL LOW (ref 12.0–15.0)
MCH: 30.2 pg (ref 26.0–34.0)
MCHC: 32.3 g/dL (ref 30.0–36.0)
MCV: 93.7 fL (ref 80.0–100.0)
Platelets: 189 10*3/uL (ref 150–400)
RBC: 3.34 MIL/uL — ABNORMAL LOW (ref 3.87–5.11)
RDW: 15.2 % (ref 11.5–15.5)
WBC: 4.3 10*3/uL (ref 4.0–10.5)
nRBC: 0 % (ref 0.0–0.2)

## 2023-04-11 LAB — BASIC METABOLIC PANEL
Anion gap: 10 (ref 5–15)
BUN: 25 mg/dL — ABNORMAL HIGH (ref 8–23)
CO2: 23 mmol/L (ref 22–32)
Calcium: 9.1 mg/dL (ref 8.9–10.3)
Chloride: 108 mmol/L (ref 98–111)
Creatinine, Ser: 1.64 mg/dL — ABNORMAL HIGH (ref 0.44–1.00)
GFR, Estimated: 29 mL/min — ABNORMAL LOW (ref >60)
Glucose, Bld: 92 mg/dL (ref 70–99)
Potassium: 3.2 mmol/L — ABNORMAL LOW (ref 3.5–5.1)
Sodium: 141 mmol/L (ref 135–145)

## 2023-04-11 LAB — HEMOGLOBIN A1C
Hgb A1c MFr Bld: 6 % — ABNORMAL HIGH (ref 4.8–5.6)
Mean Plasma Glucose: 126 mg/dL

## 2023-04-11 MED ORDER — PANTOPRAZOLE SODIUM 40 MG PO TBEC
40.0000 mg | DELAYED_RELEASE_TABLET | Freq: Every day | ORAL | Status: DC
  Administered 2023-04-12: 40 mg via ORAL
  Filled 2023-04-11: qty 1

## 2023-04-11 MED ORDER — LEVETIRACETAM 500 MG PO TABS
500.0000 mg | ORAL_TABLET | Freq: Every day | ORAL | Status: DC
  Administered 2023-04-12: 500 mg via ORAL
  Filled 2023-04-11: qty 1

## 2023-04-11 MED ORDER — HYDRALAZINE HCL 50 MG PO TABS
50.0000 mg | ORAL_TABLET | Freq: Three times a day (TID) | ORAL | Status: DC
  Administered 2023-04-11 – 2023-04-12 (×4): 50 mg via ORAL
  Filled 2023-04-11 (×5): qty 1

## 2023-04-11 MED ORDER — AMLODIPINE BESYLATE 10 MG PO TABS
10.0000 mg | ORAL_TABLET | Freq: Every day | ORAL | Status: DC
  Administered 2023-04-11 – 2023-04-12 (×2): 10 mg via ORAL
  Filled 2023-04-11 (×2): qty 1

## 2023-04-11 MED ORDER — BOOST PLUS PO LIQD
237.0000 mL | Freq: Three times a day (TID) | ORAL | Status: DC
  Administered 2023-04-11 – 2023-04-12 (×4): 237 mL via ORAL
  Filled 2023-04-11 (×5): qty 237

## 2023-04-11 MED ORDER — METOPROLOL TARTRATE 50 MG PO TABS
50.0000 mg | ORAL_TABLET | Freq: Two times a day (BID) | ORAL | Status: DC
  Administered 2023-04-11 – 2023-04-12 (×3): 50 mg via ORAL
  Filled 2023-04-11 (×4): qty 1

## 2023-04-11 MED ORDER — POTASSIUM CHLORIDE 10 MEQ/100ML IV SOLN
10.0000 meq | INTRAVENOUS | Status: AC
  Administered 2023-04-11 (×6): 10 meq via INTRAVENOUS
  Filled 2023-04-11 (×6): qty 100

## 2023-04-11 MED ORDER — ALBUTEROL SULFATE (2.5 MG/3ML) 0.083% IN NEBU: 2.5000 mg | INHALATION_SOLUTION | RESPIRATORY_TRACT | Status: DC | PRN

## 2023-04-11 NOTE — Evaluation (Signed)
Occupational Therapy Evaluation Patient Details Name: [name] MRN: [id number] DOB: 1928/11/11 Today's Date: 04/11/2023   History of Present Illness 89 yo female admitted 9/11 with abdominal pain and SBO. PMhx:Sz disorder, GERD, T2DM, CKD, dementia, HTN, CHF, recurrent UTI's and ORIF femur 2023, breast CA, hysterectomy, open cholecystectomy and appendectomy   Clinical Impression   PTA, pt lived with daughter in split level home and per chart, daughter/PCA assisted with IADL; pt reporting completely independent; unsure accuracy of report given history. Upon eval, pt performing UB ADL with set-up and LB ADL with supervision. Pt benefiting from CGA during functional mobility and progressing to supervision with RW. Will follow acutely. Suspect cognition is likely near baseline. Recommending discharge home with daughter and PCA to assist as needed.       If plan is discharge home, recommend the following: A little help with walking and/or transfers;A little help with bathing/dressing/bathroom;Assistance with cooking/housework;Help with stairs or ramp for entrance;Assist for transportation;Direct supervision/assist for medications management;Direct supervision/assist for financial management    Functional Status Assessment  Patient has had a recent decline in their functional status and demonstrates the ability to make significant improvements in function in a reasonable and predictable amount of time.  Equipment Recommendations  None recommended by OT    Recommendations for Other Services       Precautions / Restrictions Precautions Precautions: Fall Restrictions Weight Bearing Restrictions: No      Mobility Bed Mobility               General bed mobility comments: up in chiar on arrival and departure    Transfers Overall transfer level: Needs assistance   Transfers: Sit to/from Stand Sit to Stand: Supervision           General transfer comment: for safety       Balance Overall balance assessment: Needs assistance Sitting-balance support: No upper extremity supported, Feet supported Sitting balance-Leahy Scale: Good     Standing balance support: Bilateral upper extremity supported Standing balance-Leahy Scale: Poor Standing balance comment: Rw in standing                           ADL either performed or assessed with clinical judgement   ADL Overall ADL's : Needs assistance/impaired Eating/Feeding: Modified independent;Sitting   Grooming: Supervision/safety;Standing;Wash/dry hands   Upper Body Bathing: Set up;Sitting   Lower Body Bathing: Supervison/ safety;Sit to/from stand   Upper Body Dressing : Set up;Sitting   Lower Body Dressing: Supervision/safety;Sit to/from stand Lower Body Dressing Details (indicate cue type and reason): use of figure 4 position for donning socks this session Toilet Transfer: Contact guard assist;Ambulation   Toileting- Clothing Manipulation and Hygiene: Set up;Sitting/lateral lean       Functional mobility during ADLs: Contact guard assist General ADL Comments: supervision with RW; CGA without RW     Vision Baseline Vision/History: 3 Glaucoma Ability to See in Adequate Light: 1 Impaired Patient Visual Report: No change from baseline Additional Comments: glaucoma at baseline     Perception Perception: Not tested       Praxis Praxis: Not tested       Pertinent Vitals/Pain Pain Assessment Pain Assessment: No/denies pain     Extremity/Trunk Assessment Upper Extremity Assessment Upper Extremity Assessment: Generalized weakness   Lower Extremity Assessment Lower Extremity Assessment: Defer to PT evaluation   Cervical / Trunk Assessment Cervical / Trunk Assessment: Kyphotic   Communication Communication Communication: Hearing impairment Cueing Techniques: Verbal cues;Visual  cues   Cognition Arousal: Alert Behavior During Therapy: Flat affect Overall Cognitive Status:  History of cognitive impairments - at baseline                                 General Comments: Pt not oriented to year. Reporting that she still drives and that she frequently goes to ball games and [place]. Per chart, daughter/PCA assists with IADLs. Pt pleasantly confused but overall conversational and pleasant.     General Comments       Exercises     Shoulder Instructions      Home Living Family/patient expects to be discharged to:: Private residence Living Arrangements: Children Available Help at Discharge: Family;Personal care attendant;Available 24 hours/day Type of Home: House Home Access: Stairs to enter [employer] of Steps: 5 Entrance Stairs-Rails: Left Home Layout: Two level;Able to live on main level with bedroom/bathroom     Bathroom Shower/Tub: Producer, television/film/video: Handicapped height Bathroom Accessibility: Yes How Accessible: Accessible via walker Home Equipment: Rollator (4 wheels);Cane - single point;Shower seat;BSC/3in1;Transport Restaurant manager, fast food (2 wheels)          Prior Functioning/Environment Prior Level of Function : Needs assist;History of Falls (last six months)             Mobility Comments: walks with RW, cane and rail for stairs with CGA, transport chair for community ADLs Comments: independent with ADLs, daughter performs IADLs        OT Problem List: Decreased strength;Decreased activity tolerance;Impaired balance (sitting and/or standing);Decreased cognition;Decreased safety awareness;Decreased knowledge of use of DME or AE      OT Treatment/Interventions: Self-care/ADL training;Therapeutic exercise;DME and/or AE instruction;Balance training;Patient/family education;Therapeutic activities;Cognitive remediation/compensation    OT Goals(Current goals can be found in the care plan section) Acute Rehab OT Goals Patient Stated Goal: go home OT Goal Formulation: With  patient Time For Goal Achievement: 04/25/23 Potential to Achieve Goals: Good  OT Frequency: Min 1X/week    Co-evaluation              AM-PAC OT "6 Clicks" Daily Activity     Outcome Measure Help from another person eating meals?: None Help from another person taking care of personal grooming?: A Little Help from another person toileting, which includes using toliet, bedpan, or urinal?: A Little Help from another person bathing (including washing, rinsing, drying)?: A Little Help from another person to put on and taking off regular upper body clothing?: A Little Help from another person to put on and taking off regular lower body clothing?: A Little 6 Click Score: 19   End of Session Equipment Utilized During Treatment: Gait belt;Rolling walker (2 wheels) Nurse Communication: Mobility status  Activity Tolerance: Patient tolerated treatment well Patient left: in chair;with call bell/phone within reach;with chair alarm set  OT Visit Diagnosis: Unsteadiness on feet (R26.81);Muscle weakness (generalized) (M62.81);Other symptoms and signs involving cognitive function                Time: 0939-1000 OT Time Calculation (min): 21 min Charges:  OT General Charges $OT Visit: 1 Visit OT Evaluation $OT Eval Low Complexity: 1 Low  [name], OTR/L [facility] Acute Rehabilitation Office: [phone]   [name] 04/11/2023, 11:00 AM

## 2023-04-11 NOTE — Progress Notes (Signed)
PROGRESS NOTE    [name]  ZOX:[id number] DOB: 10-02-1928 DOA: 04/10/2023 PCP: Renford Dills, MD    Brief Narrative:   [name] is a 89 y.o. female with past medical history significant for type 2 diabetes mellitus, essential hypertension, chronic diastolic congestive heart failure, CKD stage IV, seizure disorder, breast cancer s/p lumpectomy/radiation, tamoxifen with surgical history significant for C-section, hysterectomy, open cholecystectomy and appendectomy who presented to [facility] ED on 9/11 with complaint of abdominal pain and nausea/vomiting.  Onset night prior to admission.  Patient with decline in her physical/mental health since hip fracture 2023; underwent ORIF and discharged to SNF.  Subsequently had more hospitalizations with sepsis secondary to bacteremia, obstructive uropathy and CHF exacerbations.  April 2024 discharge home with palliative services and has been gradually improving at home.  Currently at baseline independent and able to walk with a walker.  Recently seen in the ED on 9/6 for UTI and discharge home on Vantin.  In the ED, temperature 99.5 F, HR 100, RR 36, BP 193/96, SpO2 96% on room air.  WBC 10.3, hemoglobin 8.3, platelet count 204.  Sodium 137, potassium 4.6, chloride 106, CO2 21, glucose 169, BUN 46, creatinine 4.45.  Lipase 31.  Urinalysis unrevealing.  CT abdomen/pelvis with small bowel obstruction, fluid-filled dilated stomach with transition point appears to be near a chronic small bowel anastomosis in the midline abdomen favored postoperative adhesions with no evidence of perforation, gas within the urinary bladder.  General surgery was consulted.  Attempted NG tube placement unsuccessful.  TRH consulted for admission for further evaluation and management of small bowel obstruction.  Assessment & Plan:   Small bowel obstruction Patient presenting to ED with abdominal pain associate with nausea and vomiting.  Patient is afebrile without leukocytosis.  CT  abdomen/pelvis with small bowel obstruction, fluid-filled dilated stomach with transition point appears to be near a chronic small bowel anastomosis in the midline abdomen favored postoperative adhesions with no evidence of perforation.  -- General Surgery following, appreciate assistance -- Patient with multiple bowel movements, contrast on abdominal x-ray within the rectum -- Full liquid diet, will advance to soft as tolerates -- Strict I's and O's, monitor bowel movements  Hypokalemia Potassium 3.2, will replete. -- Repeat electrolytes in a.m. to include magnesium  Recent Klebsiella UTI Seen at [facility] on 9/6 for UTI and discharged on Vantin.  Urine culture with Klebsiella.  CT abdomen on admission shows gas within the urinary bladder, suspicious for gas-forming UTI. -- Continue ceftriaxone  Type 2 diabetes mellitus Diet controlled at baseline.  Hemoglobin A1c 6.0, well-controlled. -- SSI for coverage -- CBGs qAC/HS  Essential hypertension -- Restart amlodipine 10 mg p.o. daily, metoprolol tartrate 50 mg p.o. twice daily, hydralazine 50 mg p.o. 3 times daily  CKD stage IV -- Cr 1.73>1.64, at baseline -- BMP in a.m.  GERD --Continue PPI  History of seizure disorder -- Keppra 500 mg p.o. twice daily  History of breast cancer s/p lumpectomy, radiation, tamoxifen   DVT prophylaxis: enoxaparin (LOVENOX) injection 30 mg Start: 04/10/23 1000    Code Status: Limited: Do not attempt resuscitation (DNR) -DNR-LIMITED -Do Not Intubate/DNI  Family Communication: Updated daughter present at bedside this morning  Disposition Plan:  Level of care: Telemetry Medical Status is: Inpatient Remains inpatient appropriate because: Needs further advancement of diet with toleration before stable for discharge home, anticipate likely tomorrow    Consultants:  General surgery  Procedures:  None  Antimicrobials:  Ceftriaxone 9/11>>   Subjective:  Patient seen examined at bedside,  sitting in bedside chair.  Daughter present.  Now having bowel movements, abdominal x-ray this morning notes contrast within the rectum.  Starting on full liquid diet and further advancement as tolerates.  Discussed with general surgery PA this morning.  Patient with no other complaints or concerns at this time.  Denies headache, no chest pain, no shortness of breath, no abdominal pain, no fever, no focal weakness, no paresthesias.  No acute events overnight per nurse staff.  Objective: Vitals:   04/11/23 0614 04/11/23 0723 04/11/23 0738 04/11/23 0839  BP: (!) 157/60 (!) 173/62    Pulse: 78 83    Resp:  17    Temp:  98.3 F (36.8 C)    TempSrc:  Oral    SpO2:  99% 98% 96%  Weight:      Height:        Intake/Output Summary (Last 24 hours) at 04/11/2023 1046 Last data filed at 04/11/2023 1308 Gross per 24 hour  Intake 1357.08 ml  Output --  Net 1357.08 ml   Filed Weights   04/10/23 1004  Weight: 62.9 kg    Examination:  Physical Exam: GEN: NAD, alert and oriented x 3, elderly in appearance HEENT: NCAT, PERRL, EOMI, sclera clear, MMM PULM: CTAB w/o wheezes/crackles, normal respiratory effort CV: RRR w/o M/G/R GI: abd soft, NTND, + BS MSK: no peripheral edema, muscle strength globally intact 5/5 bilateral upper/lower extremities NEURO: CN II-XII intact, no focal deficits, sensation to light touch intact PSYCH: normal mood/affect Integumentary: dry/intact, no rashes or wounds    Data Reviewed: I have personally reviewed following labs and imaging studies  CBC: Recent Labs  Lab 04/05/23 1235 04/10/23 0645 04/11/23 0607  WBC 4.6 5.9 4.3  NEUTROABS  --  4.5  --   HGB 11.1* 12.8 10.1*  HCT 33.7* 39.6 31.3*  MCV 94.7 93.0 93.7  PLT 208 249 189   Basic Metabolic Panel: Recent Labs  Lab 04/05/23 1235 04/10/23 0645 04/11/23 0607  NA 136 136 141  K 4.4 4.1 3.2*  CL 101 96* 108  CO2 29 27 23   GLUCOSE 136* 139* 92  BUN 40* 31* 25*  CREATININE 1.88* 1.73* 1.64*   CALCIUM 9.6 10.1 9.1  MG  --  2.2  --    GFR: Estimated Creatinine Clearance: 18.9 mL/min (A) (by C-G formula based on SCr of 1.64 mg/dL (H)). Liver Function Tests: Recent Labs  Lab 04/05/23 1235 04/10/23 0645  AST 30 43*  ALT 23 26  ALKPHOS 217* 223*  BILITOT 0.3 0.9  PROT 7.0 8.1  ALBUMIN 3.7 3.6   Recent Labs  Lab 04/10/23 0645  LIPASE 31   Recent Labs  Lab 04/05/23 1235  AMMONIA 18   Coagulation Profile: No results for input(s): "INR", "PROTIME" in the last 168 hours. Cardiac Enzymes: No results for input(s): "CKTOTAL", "CKMB", "CKMBINDEX", "TROPONINI" in the last 168 hours. BNP (last 3 results) No results for input(s): "PROBNP" in the last 8760 hours. HbA1C: Recent Labs    04/10/23 0645  HGBA1C 6.0*   CBG: Recent Labs  Lab 04/10/23 1604 04/10/23 2146 04/11/23 0100 04/11/23 0424 04/11/23 0946  GLUCAP 98 97 81 83 81   Lipid Profile: No results for input(s): "CHOL", "HDL", "LDLCALC", "TRIG", "CHOLHDL", "LDLDIRECT" in the last 72 hours. Thyroid Function Tests: No results for input(s): "TSH", "T4TOTAL", "FREET4", "T3FREE", "THYROIDAB" in the last 72 hours. Anemia Panel: No results for input(s): "VITAMINB12", "FOLATE", "FERRITIN", "TIBC", "IRON", "RETICCTPCT" in the last  72 hours. Sepsis Labs: Recent Labs  Lab 04/05/23 1235  LATICACIDVEN 1.0    Recent Results (from the past 240 hour(s))  Urine Culture     Status: Abnormal   Collection Time: 04/05/23  2:28 PM   Specimen: Urine, Clean Catch  Result Value Ref Range Status   Specimen Description   Final    URINE, CLEAN CATCH Performed at [facility], [address], [place], [place] [address]    Special Requests   Final    NONE Performed at [facility], [address] [place], [place] [address]    Culture >=100,000 COLONIES/mL KLEBSIELLA PNEUMONIAE (A)  Final   Report Status 04/07/2023 FINAL  Final   Organism ID, Bacteria KLEBSIELLA PNEUMONIAE (A)   Final      Susceptibility   Klebsiella pneumoniae - MIC*    AMPICILLIN >=32 RESISTANT Resistant     CEFAZOLIN <=4 SENSITIVE Sensitive     CEFEPIME <=0.12 SENSITIVE Sensitive     CEFTRIAXONE <=0.25 SENSITIVE Sensitive     CIPROFLOXACIN 0.5 INTERMEDIATE Intermediate     GENTAMICIN <=1 SENSITIVE Sensitive     IMIPENEM <=0.25 SENSITIVE Sensitive     NITROFURANTOIN >=512 RESISTANT Resistant     TRIMETH/SULFA 80 RESISTANT Resistant     AMPICILLIN/SULBACTAM 4 SENSITIVE Sensitive     PIP/TAZO 8 SENSITIVE Sensitive     * >=100,000 COLONIES/mL KLEBSIELLA PNEUMONIAE         Radiology Studies: DG Abd Portable 1V-Small Bowel Obstruction Protocol-initial, 8 hr delay  Result Date: 04/11/2023 CLINICAL DATA:  8 hour delay for small bowel obstruction protocol. EXAM: PORTABLE ABDOMEN - 1 VIEW COMPARISON:  CT abdomen and pelvis 04/10/2023. FINDINGS: There are prominent air-filled small bowel loops in the left upper quadrant. No dilatation identified. Oral contrast is seen throughout nondilated colon to the level the rectum. There is a surgical clip in the right lower quadrant. No acute fractures are seen. IMPRESSION: No evidence for bowel obstruction. Oral contrast is seen throughout the colon to the level the rectum. Electronically Signed   By: Darliss Cheney M.D.   On: 04/11/2023 00:05   CT ABDOMEN PELVIS WO CONTRAST  Result Date: 04/10/2023 CLINICAL DATA:  89 year old female with left lower quadrant abdominal pain. Vomiting. Recent UTI. EXAM: CT ABDOMEN AND PELVIS WITHOUT CONTRAST TECHNIQUE: Multidetector CT imaging of the abdomen and pelvis was performed following the standard protocol without IV contrast. RADIATION DOSE REDUCTION: This exam was performed according to the departmental dose-optimization program which includes automated exposure control, adjustment of the mA and/or kV according to patient size and/or use of iterative reconstruction technique. COMPARISON:  Noncontrast CT Abdomen and Pelvis  07/31/2022 and earlier. FINDINGS: Lower chest: Borderline to mild cardiomegaly. Calcified coronary artery atherosclerosis or stents. No pericardial effusion. Calcified aortic atherosclerosis. Negative lung bases. Hepatobiliary: Negative noncontrast liver. Diminutive or absent gallbladder. Pancreas: Pancreatic atrophy. Stable mild pancreatic ductal enlargement. Spleen: Diminutive, negative. Adrenals/Urinary Tract: Negative adrenal glands. Nonobstructed kidneys and ureters. Diminutive bladder although prominent gas within the urinary bladder non dependently on sagittal image 63. Chronic exophytic left renal cyst appears stable with mild rim calcification and otherwise internal simple fluid density (no follow-up imaging recommended). Stomach/Bowel: Mostly decompressed large bowel throughout the abdomen. Mild retained stool in the right colon and the rectum. No large bowel inflammation. Distal small bowel loops in the right lower quadrant are decompressed including the terminal ileum. Proximal small bowel loops and the stomach are fluid distended. Similar distension of the proximal duodenum. Fluid distended chronic small  bowel anastomosis in the midline near the pelvic inlet. And there is a fairly abrupt transition point which seems to be just upstream of the anastomosis best demonstrated on coronal image 56. Upstream fluid-filled small bowel loops are dilated up to 37 mm. No free air. No free fluid. Vascular/Lymphatic: Aortoiliac calcified atherosclerosis. Normal caliber abdominal aorta. Vascular patency is not evaluated in the absence of IV contrast. No lymphadenopathy. Reproductive: Surgically absent uterus. Diminutive or absent ovaries. Other: No pelvis free fluid. Musculoskeletal: Stable visualized osseous structures. Occasional mild spinal compression fractures appear stable, most notably L4. 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 No acute osseous abnormality  identified. IMPRESSION: 1. Small Bowel Obstruction, and fluid dilated stomach. Transition point appears to be near a chronic small bowel anastomosis in the midline abdomen, coronal image 56. Favor postoperative adhesions. No evidence of perforation. 2. 3. Gas within the urinary bladder, suspicious for gas-forming UTI unless explained by recent catheterization. 4.  Aortic Atherosclerosis (ICD10-I70.0). Electronically Signed   By: Odessa Fleming M.D.   On: 04/10/2023 07:30        Scheduled Meds:  albuterol  2.5 mg Nebulization QID   enoxaparin (LOVENOX) injection  30 mg Subcutaneous Q24H   insulin aspart  0-9 Units Subcutaneous Q4H   lactose free nutrition  237 mL Oral TID WC   pantoprazole (PROTONIX) IV  40 mg Intravenous Q24H   Continuous Infusions:  sodium chloride 50 mL/hr at 04/11/23 0512   cefTRIAXone (ROCEPHIN)  IV 1 g (04/11/23 0929)   levETIRAcetam 500 mg (04/11/23 1018)   potassium chloride 10 mEq (04/11/23 0943)     LOS: 1 day    Time spent: 52 minutes spent on chart review, discussion with nursing staff, consultants, updating family and interview/physical exam; more than 50% of that time was spent in counseling and/or coordination of care.    Alvira Philips [place], DO [facility] Available via Epic secure chat 7am-7pm After these hours, please refer to coverage provider listed on [email or web address] 04/11/2023, 10:47 AM

## 2023-04-11 NOTE — Progress Notes (Signed)
[facility] Progress Note     Subjective: CC:  Just walked in the hallway using a walker. Sitting up in the chair now. Denies abdominal pain. Patient and daughter report multiple BMs yesterday, no further nausea or emesis.   Objective: Vital signs in last 24 hours: Temp:  [97.5 F (36.4 C)-99.5 F (37.5 C)] 98.3 F (36.8 C) (09/12 0723) Pulse Rate:  [78-98] 83 (09/12 0723) Resp:  [15-19] 17 (09/12 0723) BP: (143-201)/(60-98) 173/62 (09/12 0723) SpO2:  [94 %-99 %] 96 % (09/12 0839) Weight:  [62.9 kg] 62.9 kg (09/11 1004) Last BM Date : 04/10/23  Intake/Output from previous day: 09/11 0701 - 09/12 0700 In: 1357.1 [I.V.:1057.1; IV Piggyback:300] Out: -  Intake/Output this shift: No intake/output data recorded.  PE: Gen:  Alert, NAD, pleasant Card:  Regular rate and rhythm, no lower extremity edema  Pulm:  Normal effort Abd: Soft, non-tender, non-distended, no palpable hernias or masses Skin: warm and dry, no rashes  Psych: A&Ox3   Lab Results:  Recent Labs    04/10/23 0645 04/11/23 0607  WBC 5.9 4.3  HGB 12.8 10.1*  HCT 39.6 31.3*  PLT 249 189   BMET Recent Labs    04/10/23 0645 04/11/23 0607  NA 136 141  K 4.1 3.2*  CL 96* 108  CO2 27 23  GLUCOSE 139* 92  BUN 31* 25*  CREATININE 1.73* 1.64*  CALCIUM 10.1 9.1   PT/INR No results for input(s): "LABPROT", "INR" in the last 72 hours. CMP     Component Value Date/Time   NA 141 04/11/2023 0607   K 3.2 (L) 04/11/2023 0607   CL 108 04/11/2023 0607   CO2 23 04/11/2023 0607   GLUCOSE 92 04/11/2023 0607   BUN 25 (H) 04/11/2023 0607   CREATININE 1.64 (H) 04/11/2023 0607   CALCIUM 9.1 04/11/2023 0607   PROT 8.1 04/10/2023 0645   ALBUMIN 3.6 04/10/2023 0645   AST 43 (H) 04/10/2023 0645   ALT 26 04/10/2023 0645   ALKPHOS 223 (H) 04/10/2023 0645   BILITOT 0.9 04/10/2023 0645   GFRNONAA 29 (L) 04/11/2023 0607   GFRAA 33 (L) 07/29/2017 1758   Lipase     Component Value Date/Time   LIPASE  31 04/10/2023 0645       Studies/Results: DG Abd Portable 1V-Small Bowel Obstruction Protocol-initial, 8 hr delay  Result Date: 04/11/2023 CLINICAL DATA:  8 hour delay for small bowel obstruction protocol. EXAM: PORTABLE ABDOMEN - 1 VIEW COMPARISON:  CT abdomen and pelvis 04/10/2023. FINDINGS: There are prominent air-filled small bowel loops in the left upper quadrant. No dilatation identified. Oral contrast is seen throughout nondilated colon to the level the rectum. There is a surgical clip in the right lower quadrant. No acute fractures are seen. IMPRESSION: No evidence for bowel obstruction. Oral contrast is seen throughout the colon to the level the rectum. Electronically Signed   By: Darliss Cheney M.D.   On: 04/11/2023 00:05   CT ABDOMEN PELVIS WO CONTRAST  Result Date: 04/10/2023 CLINICAL DATA:  89 year old female with left lower quadrant abdominal pain. Vomiting. Recent UTI. EXAM: CT ABDOMEN AND PELVIS WITHOUT CONTRAST TECHNIQUE: Multidetector CT imaging of the abdomen and pelvis was performed following the standard protocol without IV contrast. RADIATION DOSE REDUCTION: This exam was performed according to the departmental dose-optimization program which includes automated exposure control, adjustment of the mA and/or kV according to patient size and/or use of iterative reconstruction technique. COMPARISON:  Noncontrast CT Abdomen and Pelvis 07/31/2022 and earlier.  FINDINGS: Lower chest: Borderline to mild cardiomegaly. Calcified coronary artery atherosclerosis or stents. No pericardial effusion. Calcified aortic atherosclerosis. Negative lung bases. Hepatobiliary: Negative noncontrast liver. Diminutive or absent gallbladder. Pancreas: Pancreatic atrophy. Stable mild pancreatic ductal enlargement. Spleen: Diminutive, negative. Adrenals/Urinary Tract: Negative adrenal glands. Nonobstructed kidneys and ureters. Diminutive bladder although prominent gas within the urinary bladder non dependently  on sagittal image 63. Chronic exophytic left renal cyst appears stable with mild rim calcification and otherwise internal simple fluid density (no follow-up imaging recommended). Stomach/Bowel: Mostly decompressed large bowel throughout the abdomen. Mild retained stool in the right colon and the rectum. No large bowel inflammation. Distal small bowel loops in the right lower quadrant are decompressed including the terminal ileum. Proximal small bowel loops and the stomach are fluid distended. Similar distension of the proximal duodenum. Fluid distended chronic small bowel anastomosis in the midline near the pelvic inlet. And there is a fairly abrupt transition point which seems to be just upstream of the anastomosis best demonstrated on coronal image 56. Upstream fluid-filled small bowel loops are dilated up to 37 mm. No free air. No free fluid. Vascular/Lymphatic: Aortoiliac calcified atherosclerosis. Normal caliber abdominal aorta. Vascular patency is not evaluated in the absence of IV contrast. No lymphadenopathy. Reproductive: Surgically absent uterus. Diminutive or absent ovaries. Other: No pelvis free fluid. Musculoskeletal: Stable visualized osseous structures. Occasional mild spinal compression fractures appear stable, most notably L4. 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 No acute osseous abnormality identified. IMPRESSION: 1. Small Bowel Obstruction, and fluid dilated stomach. Transition point appears to be near a chronic small bowel anastomosis in the midline abdomen, coronal image 56. Favor postoperative adhesions. No evidence of perforation. 2. 3. Gas within the urinary bladder, suspicious for gas-forming UTI unless explained by recent catheterization. 4.  Aortic Atherosclerosis (ICD10-I70.0). Electronically Signed   By: Odessa Fleming M.D.   On: 04/10/2023 07:30    Anti-infectives: Anti-infectives (From admission, onward)    Start     Dose/Rate Route  Frequency Ordered Stop   04/10/23 1100  cefTRIAXone (ROCEPHIN) 1 g in sodium chloride 0.9 % 100 mL IVPB        1 g 200 mL/hr over 30 Minutes Intravenous Daily 04/10/23 1021          Assessment/Plan  SBO with history of multiple abdominal surgeries PO protocol yesterday >> x-ray this morning with contrast in the colon. She has had multiple BMs and nausea has resolved.  Start FLD and ADAT to soft No acute surgical needs. I think she could go home tomorrow if she does well with PO intake today.     Below per primary team --  ?UTI/cystitis Diastolic CHF, pulmonary HTN GERD DM2 HTN Seizure disorder Dementia   LOS: 1 day   I reviewed nursing notes, hospitalist notes, last 24 h vitals and pain scores, last 48 h intake and output, last 24 h labs and trends, and last 24 h imaging results.  This care required moderate level of medical decision making.   Hosie Spangle, PA-C [facility] Please see Amion for pager number during day hours 7:00am-4:30pm

## 2023-04-11 NOTE — Plan of Care (Signed)
  Problem: Fluid Volume: Goal: Hemodynamic stability will improve 04/11/2023 0205 by Katrine Coho, RN Outcome: Progressing 04/11/2023 0204 by Katrine Coho, RN Outcome: Progressing   Problem: Clinical Measurements: Goal: Diagnostic test results will improve 04/11/2023 0205 by Katrine Coho, RN Outcome: Progressing 04/11/2023 0204 by Katrine Coho, RN Outcome: Progressing Goal: Signs and symptoms of infection will decrease Outcome: Progressing   Problem: Respiratory: Goal: Ability to maintain adequate ventilation will improve 04/11/2023 0205 by Katrine Coho, RN Outcome: Progressing 04/11/2023 0204 by Katrine Coho, RN Outcome: Progressing   Problem: Activity: Goal: Ability to ambulate and perform ADLs will improve Outcome: Progressing   Problem: Education: Goal: Ability to describe self-care measures that may prevent or decrease complications (Diabetes Survival Skills Education) will improve Outcome: Progressing   Problem: Coping: Goal: Ability to adjust to condition or change in health will improve Outcome: Progressing   Problem: Fluid Volume: Goal: Ability to maintain a balanced intake and output will improve Outcome: Progressing   Problem: Health Behavior/Discharge Planning: Goal: Ability to identify and utilize available resources and services will improve Outcome: Progressing Goal: Ability to manage health-related needs will improve Outcome: Progressing   Problem: Metabolic: Goal: Ability to maintain appropriate glucose levels will improve Outcome: Progressing   Problem: Nutritional: Goal: Maintenance of adequate nutrition will improve Outcome: Progressing Goal: Progress toward achieving an optimal weight will improve Outcome: Progressing   Problem: Skin Integrity: Goal: Risk for impaired skin integrity will decrease Outcome: Progressing   Problem: Education: Goal: Knowledge of General Education information will  improve Description: Including pain rating scale, medication(s)/side effects and non-pharmacologic comfort measures Outcome: Progressing   Problem: Health Behavior/Discharge Planning: Goal: Ability to manage health-related needs will improve Outcome: Progressing   Problem: Clinical Measurements: Goal: Ability to maintain clinical measurements within normal limits will improve Outcome: Progressing Goal: Will remain free from infection Outcome: Progressing Goal: Diagnostic test results will improve Outcome: Progressing Goal: Respiratory complications will improve Outcome: Progressing   Problem: Nutrition: Goal: Adequate nutrition will be maintained Outcome: Progressing   Problem: Coping: Goal: Level of anxiety will decrease Outcome: Progressing

## 2023-04-11 NOTE — Evaluation (Signed)
Physical Therapy Evaluation Patient Details Name: [name] MRN: [id number] DOB: [birth year] Today's Date: 04/11/2023  History of Present Illness  89 yo female admitted 9/11 with abdominal pain and SBO. PMhx:Sz disorder, GERD, T2DM, CKD, dementia, HTN, CHF, recurrent UTI's and ORIF femur 2023, breast CA, hysterectomy, open cholecystectomy and appendectomy  Clinical Impression  PT pleasant and reports walking in church with heels and RW grossly 300' as max distance. Pt walks in home with RW and performs ADLs. Daughter able to assist. Pt with decreased activity tolerance and mobility due to fatigue but moving well at supervision level. Pt will benefit from acute therapy to maximize mobility and safety for return home. Encouraged daily ambulation and mobility with staff.       If plan is discharge home, recommend the following: Assistance with cooking/housework;Assist for transportation;Supervision due to cognitive status;Direct supervision/assist for medications management;Direct supervision/assist for financial management;Help with stairs or ramp for entrance   Can travel by private vehicle        Equipment Recommendations None recommended by PT  Recommendations for Other Services       Functional Status Assessment Patient has had a recent decline in their functional status and/or demonstrates limited ability to make significant improvements in function in a reasonable and predictable amount of time     Precautions / Restrictions Precautions Precautions: Fall      Mobility  Bed Mobility Overal bed mobility: Modified Independent             General bed mobility comments: increased time with bed flat    Transfers Overall transfer level: Needs assistance   Transfers: Sit to/from Stand Sit to Stand: Supervision           General transfer comment: supervision for IV pole    Ambulation/Gait Ambulation/Gait assistance: Supervision Gait Distance (Feet): 80  Feet Assistive device: Rolling walker (2 wheels) Gait Pattern/deviations: Step-through pattern, Decreased stride length, Trunk flexed   Gait velocity interpretation: <1.8 ft/sec, indicate of risk for recurrent falls   General Gait Details: cues for direction and posture, pt limited by fatigue reporting SOB with SPO2 96% on RA  Stairs            Wheelchair Mobility     Tilt Bed    Modified Rankin (Stroke Patients Only)       Balance Overall balance assessment: Needs assistance Sitting-balance support: No upper extremity supported, Feet supported Sitting balance-Leahy Scale: Good Sitting balance - Comments: EOB without support   Standing balance support: Bilateral upper extremity supported Standing balance-Leahy Scale: Poor Standing balance comment: Rw in standing                             Pertinent Vitals/Pain Pain Assessment Pain Assessment: No/denies pain    Home Living Family/patient expects to be discharged to:: Private residence Living Arrangements: Children Available Help at Discharge: Family;Personal care attendant;Available 24 hours/day Type of Home: House Home Access: Stairs to enter Entrance Stairs-Rails: Left Entrance Stairs-Number of Steps: 5   Home Layout: Two level;Able to live on main level with bedroom/bathroom Home Equipment: Rollator (4 wheels);Cane - single point;Shower seat;BSC/3in1;Transport Restaurant manager, fast food (2 wheels)      Prior Function               Mobility Comments: walks with RW, cane and rail for stairs with CGA, transport chair for community ADLs Comments: independent with ADLs, daughter performs IADLs     Extremity/Trunk Assessment  Upper Extremity Assessment Upper Extremity Assessment: Generalized weakness    Lower Extremity Assessment Lower Extremity Assessment: Generalized weakness    Cervical / Trunk Assessment Cervical / Trunk Assessment: Kyphotic  Communication    Communication Communication: Hearing impairment  Cognition Arousal: Alert Behavior During Therapy: Flat affect Overall Cognitive Status: History of cognitive impairments - at baseline                                          General Comments      Exercises     Assessment/Plan    PT Assessment Patient needs continued PT services  PT Problem List Decreased activity tolerance;Decreased mobility       PT Treatment Interventions DME instruction;Gait training;Stair training;Functional mobility training;Therapeutic activities;Patient/family education    PT Goals (Current goals can be found in the Care Plan section)  Acute Rehab PT Goals Patient Stated Goal: return home PT Goal Formulation: With patient/family Time For Goal Achievement: 04/18/23 Potential to Achieve Goals: Good    Frequency Min 1X/week     Co-evaluation               AM-PAC PT "6 Clicks" Mobility  Outcome Measure Help needed turning from your back to your side while in a flat bed without using bedrails?: None Help needed moving from lying on your back to sitting on the side of a flat bed without using bedrails?: None Help needed moving to and from a bed to a chair (including a wheelchair)?: None Help needed standing up from a chair using your arms (e.g., wheelchair or bedside chair)?: A Little Help needed to walk in hospital room?: A Little Help needed climbing 3-5 steps with a railing? : A Little 6 Click Score: 21    End of Session   Activity Tolerance: Patient tolerated treatment well Patient left: in chair;with call bell/phone within reach;with chair alarm set;with family/visitor present Nurse Communication: Mobility status PT Visit Diagnosis: Other abnormalities of gait and mobility (R26.89)    Time: 3086-5784 PT Time Calculation (min) (ACUTE ONLY): 21 min   Charges:   PT Evaluation $PT Eval Low Complexity: 1 Low   PT General Charges $$ ACUTE PT VISIT: 1 Visit          [name], PT Acute Rehabilitation Services Office: [phone]   Cristine Polio 04/11/2023, 8:43 AM

## 2023-04-12 DIAGNOSIS — K56609 Unspecified intestinal obstruction, unspecified as to partial versus complete obstruction: Secondary | ICD-10-CM | POA: Diagnosis not present

## 2023-04-12 LAB — GLUCOSE, CAPILLARY
Glucose-Capillary: 94 mg/dL (ref 70–99)
Glucose-Capillary: 89 mg/dL (ref 70–99)
Glucose-Capillary: 78 mg/dL (ref 70–99)
Glucose-Capillary: 127 mg/dL — ABNORMAL HIGH (ref 70–99)

## 2023-04-12 LAB — BASIC METABOLIC PANEL
Anion gap: 8 (ref 5–15)
BUN: 21 mg/dL (ref 8–23)
CO2: 24 mmol/L (ref 22–32)
Calcium: 9.3 mg/dL (ref 8.9–10.3)
Chloride: 104 mmol/L (ref 98–111)
Creatinine, Ser: 1.38 mg/dL — ABNORMAL HIGH (ref 0.44–1.00)
GFR, Estimated: 36 mL/min — ABNORMAL LOW (ref >60)
Glucose, Bld: 91 mg/dL (ref 70–99)
Potassium: 3.7 mmol/L (ref 3.5–5.1)
Sodium: 136 mmol/L (ref 135–145)

## 2023-04-12 LAB — MAGNESIUM: Magnesium: 1.8 mg/dL (ref 1.7–2.4)

## 2023-04-12 MED ORDER — ISOSORBIDE MONONITRATE ER 30 MG PO TB24
15.0000 mg | ORAL_TABLET | Freq: Every day | ORAL | Status: DC
  Administered 2023-04-12: 15 mg via ORAL
  Filled 2023-04-12: qty 1

## 2023-04-12 NOTE — Progress Notes (Signed)
[facility] Progress Note     Subjective: CC:  Resting comfortably in bed. She denies abdominal pain at present. She tells me that she had no trouble with PO intake yesterday. Daughter not at bedside.   PT/OT recommending no follow up.   Objective: Vital signs in last 24 hours: Temp:  [97.5 F (36.4 C)-98.4 F (36.9 C)] 98.4 F (36.9 C) (09/13 0546) Pulse Rate:  [75-114] 75 (09/13 0546) Resp:  [17-20] 18 (09/13 0546) BP: (150-186)/(61-98) 183/71 (09/13 0546) SpO2:  [95 %-100 %] 95 % (09/13 0546) Last BM Date : 04/11/23  Intake/Output from previous day: 09/12 0701 - 09/13 0700 In: 293.7 [I.V.:193.7; IV Piggyback:100] Out: -  Intake/Output this shift: No intake/output data recorded.  PE: Gen:  Alert, NAD, pleasant Pulm:  Normal effort ORA Abd: Soft, non-tender, non-distended, no palpable hernias or masses Psych: oriented to person and hospital  Lab Results:  Recent Labs    04/10/23 0645 04/11/23 0607  WBC 5.9 4.3  HGB 12.8 10.1*  HCT 39.6 31.3*  PLT 249 189   BMET Recent Labs    04/11/23 0607 04/12/23 0607  NA 141 136  K 3.2* 3.7  CL 108 104  CO2 23 24  GLUCOSE 92 91  BUN 25* 21  CREATININE 1.64* 1.38*  CALCIUM 9.1 9.3   PT/INR No results for input(s): "LABPROT", "INR" in the last 72 hours. CMP     Component Value Date/Time   NA 136 04/12/2023 0607   K 3.7 04/12/2023 0607   CL 104 04/12/2023 0607   CO2 24 04/12/2023 0607   GLUCOSE 91 04/12/2023 0607   BUN 21 04/12/2023 0607   CREATININE 1.38 (H) 04/12/2023 0607   CALCIUM 9.3 04/12/2023 0607   PROT 8.1 04/10/2023 0645   ALBUMIN 3.6 04/10/2023 0645   AST 43 (H) 04/10/2023 0645   ALT 26 04/10/2023 0645   ALKPHOS 223 (H) 04/10/2023 0645   BILITOT 0.9 04/10/2023 0645   GFRNONAA 36 (L) 04/12/2023 0607   GFRAA 33 (L) 07/29/2017 1758   Lipase     Component Value Date/Time   LIPASE 31 04/10/2023 0645       Studies/Results: DG Abd Portable 1V-Small Bowel Obstruction  Protocol-initial, 8 hr delay  Result Date: 04/11/2023 CLINICAL DATA:  8 hour delay for small bowel obstruction protocol. EXAM: PORTABLE ABDOMEN - 1 VIEW COMPARISON:  CT abdomen and pelvis 04/10/2023. FINDINGS: There are prominent air-filled small bowel loops in the left upper quadrant. No dilatation identified. Oral contrast is seen throughout nondilated colon to the level the rectum. There is a surgical clip in the right lower quadrant. No acute fractures are seen. IMPRESSION: No evidence for bowel obstruction. Oral contrast is seen throughout the colon to the level the rectum. Electronically Signed   By: Darliss Cheney M.D.   On: 04/11/2023 00:05    Anti-infectives: Anti-infectives (From admission, onward)    Start     Dose/Rate Route Frequency Ordered Stop   04/10/23 1100  cefTRIAXone (ROCEPHIN) 1 g in sodium chloride 0.9 % 100 mL IVPB        1 g 200 mL/hr over 30 Minutes Intravenous Daily 04/10/23 1021 04/13/23 0959        Assessment/Plan  SBO with history of multiple abdominal surgeries PO protocol yesterday >> x-ray 9/12 with contrast in the colon.  No acute surgical needs. Tolerating PO intake, mobilizing well, having BMs. Stable for discharge from a surgical perspective.     Below per primary team --  ?  UTI/cystitis Diastolic CHF, pulmonary HTN GERD DM2 HTN Seizure disorder Dementia   LOS: 2 days   I reviewed nursing notes, hospitalist notes, last 24 h vitals and pain scores, last 48 h intake and output, last 24 h labs and trends, and last 24 h imaging results.  This care required moderate level of medical decision making.   Hosie Spangle, PA-C [facility] Please see Amion for pager number during day hours 7:00am-4:30pm

## 2023-04-12 NOTE — Plan of Care (Signed)
  Problem: Fluid Volume: Goal: Hemodynamic stability will improve Outcome: Progressing   Problem: Clinical Measurements: Goal: Diagnostic test results will improve Outcome: Progressing Goal: Signs and symptoms of infection will decrease Outcome: Progressing   Problem: Respiratory: Goal: Ability to maintain adequate ventilation will improve Outcome: Progressing   Problem: Education: Goal: Verbalization of understanding the information provided (i.e., activity precautions, restrictions, etc) will improve Outcome: Progressing Goal: Individualized Educational Video(s) Outcome: Progressing   Problem: Activity: Goal: Ability to ambulate and perform ADLs will improve Outcome: Progressing   Problem: Clinical Measurements: Goal: Postoperative complications will be avoided or minimized Outcome: Progressing   Problem: Self-Concept: Goal: Ability to maintain and perform role responsibilities to the fullest extent possible will improve Outcome: Progressing   Problem: Pain Management: Goal: Pain level will decrease Outcome: Progressing   Problem: Education: Goal: Ability to describe self-care measures that may prevent or decrease complications (Diabetes Survival Skills Education) will improve Outcome: Progressing Goal: Individualized Educational Video(s) Outcome: Progressing   Problem: Coping: Goal: Ability to adjust to condition or change in health will improve Outcome: Progressing   Problem: Fluid Volume: Goal: Ability to maintain a balanced intake and output will improve Outcome: Progressing   Problem: Health Behavior/Discharge Planning: Goal: Ability to identify and utilize available resources and services will improve Outcome: Progressing Goal: Ability to manage health-related needs will improve Outcome: Progressing   Problem: Metabolic: Goal: Ability to maintain appropriate glucose levels will improve Outcome: Progressing   Problem: Nutritional: Goal: Maintenance  of adequate nutrition will improve Outcome: Progressing Goal: Progress toward achieving an optimal weight will improve Outcome: Progressing   Problem: Skin Integrity: Goal: Risk for impaired skin integrity will decrease Outcome: Progressing   Problem: Tissue Perfusion: Goal: Adequacy of tissue perfusion will improve Outcome: Progressing   Problem: Education: Goal: Knowledge of General Education information will improve Description: Including pain rating scale, medication(s)/side effects and non-pharmacologic comfort measures Outcome: Progressing   Problem: Health Behavior/Discharge Planning: Goal: Ability to manage health-related needs will improve Outcome: Progressing   Problem: Clinical Measurements: Goal: Ability to maintain clinical measurements within normal limits will improve Outcome: Progressing Goal: Will remain free from infection Outcome: Progressing Goal: Diagnostic test results will improve Outcome: Progressing Goal: Respiratory complications will improve Outcome: Progressing Goal: Cardiovascular complication will be avoided Outcome: Progressing   Problem: Activity: Goal: Risk for activity intolerance will decrease Outcome: Progressing   Problem: Nutrition: Goal: Adequate nutrition will be maintained Outcome: Progressing   Problem: Coping: Goal: Level of anxiety will decrease Outcome: Progressing   Problem: Elimination: Goal: Will not experience complications related to bowel motility Outcome: Progressing Goal: Will not experience complications related to urinary retention Outcome: Progressing   Problem: Pain Managment: Goal: General experience of comfort will improve Outcome: Progressing   Problem: Safety: Goal: Ability to remain free from injury will improve Outcome: Progressing   Problem: Skin Integrity: Goal: Risk for impaired skin integrity will decrease Outcome: Progressing

## 2023-04-12 NOTE — Discharge Summary (Signed)
Physician Discharge Summary  [name]:[id number] DOB: [birth year] DOA: 04/10/2023  PCP: Renford Dills, MD  Admit date: 04/10/2023 Discharge date: 04/12/2023  Admitted From: Home Disposition: Home  Recommendations for Outpatient Follow-up:  Follow up with PCP in 1-2 weeks Follow-up with general surgery, Dr. Freida Busman as needed  Home Health: No needs identified by PT/OT during hospitalization Equipment/Devices: None  Discharge Condition: Stable CODE STATUS: DNR Diet recommendation: Heart healthy/consistent carbohydrate diet  History of present illness:  [name] is a 89 y.o. female with past medical history significant for type 2 diabetes mellitus, essential hypertension, chronic diastolic congestive heart failure, CKD stage IV, seizure disorder, breast cancer s/p lumpectomy/radiation, tamoxifen with surgical history significant for C-section, hysterectomy, open cholecystectomy and appendectomy who presented to [facility] ED on 9/11 with complaint of abdominal pain and nausea/vomiting.  Onset night prior to admission.   Patient with decline in her physical/mental health since hip fracture 2023; underwent ORIF and discharged to SNF.  Subsequently had more hospitalizations with sepsis secondary to bacteremia, obstructive uropathy and CHF exacerbations.  April 2024 discharge home with palliative services and has been gradually improving at home.  Currently at baseline independent and able to walk with a walker.  Recently seen in the ED on 9/6 for UTI and discharge home on Vantin.   In the ED, temperature 99.5 F, HR 100, RR 36, BP 193/96, SpO2 96% on room air.  WBC 10.3, hemoglobin 8.3, platelet count 204.  Sodium 137, potassium 4.6, chloride 106, CO2 21, glucose 169, BUN 46, creatinine 4.45.  Lipase 31.  Urinalysis unrevealing.  CT abdomen/pelvis with small bowel obstruction, fluid-filled dilated stomach with transition point appears to be near a chronic small bowel anastomosis in the midline  abdomen favored postoperative adhesions with no evidence of perforation, gas within the urinary bladder.  General surgery was consulted.  Attempted NG tube placement unsuccessful.  TRH consulted for admission for further evaluation and management of small bowel obstruction.  Hospital course:  Small bowel obstruction Patient presenting to ED with abdominal pain associate with nausea and vomiting.  Patient is afebrile without leukocytosis.  CT abdomen/pelvis with small bowel obstruction, fluid-filled dilated stomach with transition point appears to be near a chronic small bowel anastomosis in the midline abdomen favored postoperative adhesions with no evidence of perforation.  General surgery was consulted and followed during hospital course.  Attempted insertion of NG tube unsuccessful.  Fortunately patient responded well to conservative measures with multiple bowel movements and repeat abdominal x-ray showing contrast within the rectum.  Patient's diet was slowly advanced with toleration.  Outpatient follow-up with general surgery as needed.   Hypokalemia Repleted during hospitalization.   Recent Klebsiella UTI Seen at [facility] on 9/6 for UTI and discharged on Vantin.  Urine culture with Klebsiella.  CT abdomen on admission shows gas within the urinary bladder, suspicious for gas-forming UTI.  Completed 3-day course of IV ceftriaxone while inpatient.   Type 2 diabetes mellitus Diet controlled at baseline.  Hemoglobin A1c 6.0, well-controlled.   Essential hypertension Continue home amlodipine 10 mg p.o. daily, metoprolol tartrate 50 mg p.o. twice daily, hydralazine 50 mg p.o. 3 times, Imdur 7.5 mg p.o. daily daily   CKD stage IV Randon 1.38 at time of discharge.  At baseline.   GERD Continue PPI   History of seizure disorder Keppra 500 mg p.o. twice daily   History of breast cancer s/p lumpectomy, radiation, tamoxifen  Discharge Diagnoses:  Principal Problem:   SBO (small bowel  obstruction) (HCC) Active Problems:   Chronic diastolic CHF (congestive heart failure) (HCC)   Type 2 diabetes mellitus with complication, without long-term current use of insulin (HCC)   Hypertension    Discharge Instructions  Discharge Instructions     Call MD for:  difficulty breathing, headache or visual disturbances   Complete by: As directed    Call MD for:  extreme fatigue   Complete by: As directed    Call MD for:  persistant dizziness or light-headedness   Complete by: As directed    Call MD for:  persistant nausea and vomiting   Complete by: As directed    Call MD for:  severe uncontrolled pain   Complete by: As directed    Call MD for:  temperature >100.4   Complete by: As directed    Diet - low sodium heart healthy   Complete by: As directed    Increase activity slowly   Complete by: As directed       Allergies as of 04/12/2023       Reactions   Ace Inhibitors Swelling, Rash, Cough   Prandin [repaglinide] Other (See Comments)   Caused significant peripheral edema   Hydromorphone Itching, Other (See Comments), Rash   Other Reaction: pruritis, Hallucinations (auditory and visual)   Dilaudid [hydromorphone Hcl] Itching, Other (See Comments)   Hallucinations (auditory and visual) also   Oxycodone Hcl Itching   Latex Rash   Other Nausea And Vomiting, Rash   "Tussionex Pennkinetic ER" Prefers PAPER TAPE, PLEASE!!   Tape Rash   Prefers PAPER TAPE, PLEASE!!        Medication List     TAKE these medications    acetaminophen 500 MG tablet Commonly known as: TYLENOL Take 2 tablets (1,000 mg total) by mouth every 8 (eight) hours as needed for mild pain or moderate pain. What changed: when to take this   allopurinol 100 MG tablet Commonly known as: ZYLOPRIM Take 100 mg by mouth 2 (two) times daily.   amLODipine 10 MG tablet Commonly known as: NORVASC Take 10 mg by mouth daily.   blood glucose meter kit and supplies Kit Dispense based on patient and  insurance preference. Use up to four times daily as directed. (FOR ICD-9 250.00, 250.01). For QAC - HS accuchecks. What changed:  how much to take how to take this when to take this   cefpodoxime 200 MG tablet Commonly known as: VANTIN Take 1 tablet (200 mg total) by mouth daily for 10 days.   Centrum Silver 50+Women Tabs Take 1 tablet by mouth daily with breakfast.   dronabinol 2.5 MG capsule Commonly known as: MARINOL Take 1 capsule (2.5 mg total) by mouth 2 (two) times daily before lunch and supper.   FreeStyle Libre 2 Sensor Misc USE AS DIRECTED. REAPPLY EVERY 14 DAYS What changed: See the new instructions.   furosemide 40 MG tablet Commonly known as: LASIX Take 20 mg by mouth as needed for edema.   hydrALAZINE 50 MG tablet Commonly known as: APRESOLINE Take 50 mg by mouth 3 (three) times daily.   isosorbide mononitrate 30 MG 24 hr tablet Commonly known as: IMDUR Take 0.5 tablets (15 mg total) by mouth daily. What changed: how much to take   levETIRAcetam 500 MG tablet Commonly known as: KEPPRA Take 500 mg by mouth daily.   metoprolol tartrate 50 MG tablet Commonly known as: LOPRESSOR Take 50 mg by mouth 2 (two) times daily with a meal.   mirtazapine 15 MG  tablet Commonly known as: REMERON Take 15 mg by mouth at bedtime.   omeprazole 20 MG capsule Commonly known as: PRILOSEC Take 20 mg by mouth every Monday, Wednesday, and Friday.   QUEtiapine 25 MG tablet Commonly known as: SEROQUEL Take 25 mg by mouth daily.        Follow-up Information     Renford Dills, MD. Schedule an appointment as soon as possible for a visit in 1 week(s).   Specialty: Internal Medicine Contact information: [address] [place] [address] [phone]         Murrell Converse Follow up.   Specialty: Family Medicine Why: As needed Contact information: [address] [place] [address] [phone]         Fritzi Mandes, MD .    Specialty: General Surgery Contact information: [address] [place] [address] [phone]                Allergies  Allergen Reactions   Ace Inhibitors Swelling, Rash and Cough   Prandin [Repaglinide] Other (See Comments)    Caused significant peripheral edema   Hydromorphone Itching, Other (See Comments) and Rash    Other Reaction: pruritis, Hallucinations (auditory and visual)   Dilaudid [Hydromorphone Hcl] Itching and Other (See Comments)    Hallucinations (auditory and visual) also   Oxycodone Hcl Itching   Latex Rash   Other Nausea And Vomiting and Rash    "Tussionex Pennkinetic ER"  Prefers PAPER TAPE, PLEASE!!   Tape Rash    Prefers PAPER TAPE, PLEASE!!    Consultations: General Surgery   Procedures/Studies: DG Abd Portable 1V-Small Bowel Obstruction Protocol-initial, 8 hr delay  Result Date: 04/11/2023 CLINICAL DATA:  8 hour delay for small bowel obstruction protocol. EXAM: PORTABLE ABDOMEN - 1 VIEW COMPARISON:  CT abdomen and pelvis 04/10/2023. FINDINGS: There are prominent air-filled small bowel loops in the left upper quadrant. No dilatation identified. Oral contrast is seen throughout nondilated colon to the level the rectum. There is a surgical clip in the right lower quadrant. No acute fractures are seen. IMPRESSION: No evidence for bowel obstruction. Oral contrast is seen throughout the colon to the level the rectum. Electronically Signed   By: Darliss Cheney M.D.   On: 04/11/2023 00:05   CT ABDOMEN PELVIS WO CONTRAST  Result Date: 04/10/2023 CLINICAL DATA:  89 year old female with left lower quadrant abdominal pain. Vomiting. Recent UTI. EXAM: CT ABDOMEN AND PELVIS WITHOUT CONTRAST TECHNIQUE: Multidetector CT imaging of the abdomen and pelvis was performed following the standard protocol without IV contrast. RADIATION DOSE REDUCTION: This exam was performed according to the departmental dose-optimization program which includes automated  exposure control, adjustment of the mA and/or kV according to patient size and/or use of iterative reconstruction technique. COMPARISON:  Noncontrast CT Abdomen and Pelvis 07/31/2022 and earlier. FINDINGS: Lower chest: Borderline to mild cardiomegaly. Calcified coronary artery atherosclerosis or stents. No pericardial effusion. Calcified aortic atherosclerosis. Negative lung bases. Hepatobiliary: Negative noncontrast liver. Diminutive or absent gallbladder. Pancreas: Pancreatic atrophy. Stable mild pancreatic ductal enlargement. Spleen: Diminutive, negative. Adrenals/Urinary Tract: Negative adrenal glands. Nonobstructed kidneys and ureters. Diminutive bladder although prominent gas within the urinary bladder non dependently on sagittal image 63. Chronic exophytic left renal cyst appears stable with mild rim calcification and otherwise internal simple fluid density (no follow-up imaging recommended). Stomach/Bowel: Mostly decompressed large bowel throughout the abdomen. Mild retained stool in the right colon and the rectum. No large bowel inflammation. Distal small  bowel loops in the right lower quadrant are decompressed including the terminal ileum. Proximal small bowel loops and the stomach are fluid distended. Similar distension of the proximal duodenum. Fluid distended chronic small bowel anastomosis in the midline near the pelvic inlet. And there is a fairly abrupt transition point which seems to be just upstream of the anastomosis best demonstrated on coronal image 56. Upstream fluid-filled small bowel loops are dilated up to 37 mm. No free air. No free fluid. Vascular/Lymphatic: Aortoiliac calcified atherosclerosis. Normal caliber abdominal aorta. Vascular patency is not evaluated in the absence of IV contrast. No lymphadenopathy. Reproductive: Surgically absent uterus. Diminutive or absent ovaries. Other: No pelvis free fluid. Musculoskeletal: Stable visualized osseous structures. Occasional mild spinal  compression fractures appear stable, most notably L4. 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 No acute osseous abnormality identified. IMPRESSION: 1. Small Bowel Obstruction, and fluid dilated stomach. Transition point appears to be near a chronic small bowel anastomosis in the midline abdomen, coronal image 56. Favor postoperative adhesions. No evidence of perforation. 2. 3. Gas within the urinary bladder, suspicious for gas-forming UTI unless explained by recent catheterization. 4.  Aortic Atherosclerosis (ICD10-I70.0). Electronically Signed   By: Odessa Fleming M.D.   On: 04/10/2023 07:30   CT Head Wo Contrast  Result Date: 04/05/2023 CLINICAL DATA:  Delirium EXAM: CT HEAD WITHOUT CONTRAST TECHNIQUE: Contiguous axial images were obtained from the base of the skull through the vertex without intravenous contrast. RADIATION DOSE REDUCTION: This exam was performed according to the departmental dose-optimization program which includes automated exposure control, adjustment of the mA and/or kV according to patient size and/or use of iterative reconstruction technique. COMPARISON:  07/31/2022 FINDINGS: Brain: No evidence of acute infarction, hemorrhage, hydrocephalus, extra-axial collection or mass lesion/mass effect. Scattered low-density changes within the periventricular and subcortical white matter most compatible with chronic microvascular ischemic change. Mild diffuse cerebral volume loss. Vascular: Atherosclerotic calcifications involving the large vessels of the skull base. No unexpected hyperdense vessel. Skull: Normal. Negative for fracture or focal lesion. Sinuses/Orbits: Opacification of the left sphenoid sinus and posterior left ethmoid air cells. Other: None. IMPRESSION: 1. No acute intracranial findings. 2. Mild chronic microvascular ischemic change and cerebral volume loss. 3. Left sphenoid and ethmoid sinus disease. Electronically Signed   By: Duanne Guess  D.O.   On: 04/05/2023 15:08   DG Chest Portable 1 View  Result Date: 04/05/2023 CLINICAL DATA:  Altered mental status EXAM: PORTABLE CHEST 1 VIEW COMPARISON:  X-ray 11/15/2022 FINDINGS: Enlarged cardiopericardial silhouette with calcified aorta. No consolidation, pneumothorax or effusion. No edema. Stable interstitial prominence. Possibly chronic. Overlapping cardiac leads. Surgical clips in the left axillary region. Osteopenia and degenerative changes. Prominent calcifications along the tracheobronchial tree. IMPRESSION: Enlarged heart with a calcified aorta.  Chronic changes. Electronically Signed   By: Karen Kays M.D.   On: 04/05/2023 14:35     Subjective: Patient seen examined bedside, resting comfortably.  Lying in bed.  Tolerating diet.  2 bowel movements past 24 hours.  Daughter present at bedside.  States ready for discharge home.  Daughter with no concerns.  Denies headache, no dizziness, no chest pain, no palpitations, no shortness of breath, no abdominal pain, no fever, no nausea/vomiting/diarrhea, no focal weakness, no paresthesia.  No acute events overnight per nursing staff.  Discharging home today.  Discharge Exam: Vitals:   04/11/23 2113 04/12/23 0546  BP: (!) 177/71 (!) 183/71  Pulse: 82 75  Resp: 18 18  Temp:  97.9 F (36.6 C) 98.4 F (36.9 C)  SpO2: 97% 95%   Vitals:   04/11/23 1539 04/11/23 1732 04/11/23 2113 04/12/23 0546  BP: (!) 150/61 (!) 186/98 (!) 177/71 (!) 183/71  Pulse: 86 87 82 75  Resp: 17 20 18 18   Temp: 98.1 F (36.7 C)  97.9 F (36.6 C) 98.4 F (36.9 C)  TempSrc: Oral  Oral Oral  SpO2: 100% 100% 97% 95%  Weight:      Height:        Physical Exam: GEN: NAD, alert and oriented x 3, elderly in appearance HEENT: NCAT, PERRL, EOMI, sclera clear, MMM PULM: CTAB w/o wheezes/crackles, normal respiratory effort, on room air CV: RRR w/o M/G/R GI: abd soft, NTND, + BS MSK: no peripheral edema, moves all extremities independently NEURO: No focal neuro  deficits PSYCH: normal mood/affect Integumentary: dry/intact, no rashes or wounds    The results of significant diagnostics from this hospitalization (including imaging, microbiology, ancillary and laboratory) are listed below for reference.     Microbiology: Recent Results (from the past 240 hour(s))  Urine Culture     Status: Abnormal   Collection Time: 04/05/23  2:28 PM   Specimen: Urine, Clean Catch  Result Value Ref Range Status   Specimen Description   Final    URINE, CLEAN CATCH Performed at [facility], [address], [place], [place] [address]    Special Requests   Final    NONE Performed at [facility], [address], [place], [place] [address]    Culture >=100,000 COLONIES/mL KLEBSIELLA PNEUMONIAE (A)  Final   Report Status 04/07/2023 FINAL  Final   Organism ID, Bacteria KLEBSIELLA PNEUMONIAE (A)  Final      Susceptibility   Klebsiella pneumoniae - MIC*    AMPICILLIN >=32 RESISTANT Resistant     CEFAZOLIN <=4 SENSITIVE Sensitive     CEFEPIME <=0.12 SENSITIVE Sensitive     CEFTRIAXONE <=0.25 SENSITIVE Sensitive     CIPROFLOXACIN 0.5 INTERMEDIATE Intermediate     GENTAMICIN <=1 SENSITIVE Sensitive     IMIPENEM <=0.25 SENSITIVE Sensitive     NITROFURANTOIN >=512 RESISTANT Resistant     TRIMETH/SULFA 80 RESISTANT Resistant     AMPICILLIN/SULBACTAM 4 SENSITIVE Sensitive     PIP/TAZO 8 SENSITIVE Sensitive     * >=100,000 COLONIES/mL KLEBSIELLA PNEUMONIAE     Labs: BNP (last 3 results) Recent Labs    06/30/22 1906 07/03/22 0225 11/15/22 1130  BNP 103.8* 309.9* 1,969.7*   Basic Metabolic Panel: Recent Labs  Lab 04/05/23 1235 04/10/23 0645 04/11/23 0607 04/12/23 0607  NA 136 136 141 136  K 4.4 4.1 3.2* 3.7  CL 101 96* 108 104  CO2 29 27 23 24   GLUCOSE 136* 139* 92 91  BUN 40* 31* 25* 21  CREATININE 1.88* 1.73* 1.64* 1.38*  CALCIUM 9.6 10.1 9.1 9.3  MG  --  2.2  --  1.8   Liver Function Tests: Recent  Labs  Lab 04/05/23 1235 04/10/23 0645  AST 30 43*  ALT 23 26  ALKPHOS 217* 223*  BILITOT 0.3 0.9  PROT 7.0 8.1  ALBUMIN 3.7 3.6   Recent Labs  Lab 04/10/23 0645  LIPASE 31   Recent Labs  Lab 04/05/23 1235  AMMONIA 18   CBC: Recent Labs  Lab 04/05/23 1235 04/10/23 0645 04/11/23 0607  WBC 4.6 5.9 4.3  NEUTROABS  --  4.5  --   HGB 11.1* 12.8 10.1*  HCT 33.7* 39.6 31.3*  MCV 94.7  93.0 93.7  PLT 208 249 189   Cardiac Enzymes: No results for input(s): "CKTOTAL", "CKMB", "CKMBINDEX", "TROPONINI" in the last 168 hours. BNP: Invalid input(s): "POCBNP" CBG: Recent Labs  Lab 04/11/23 2113 04/12/23 0140 04/12/23 0507 04/12/23 0549 04/12/23 0801  GLUCAP 88 94 89 78 127*   D-Dimer No results for input(s): "DDIMER" in the last 72 hours. Hgb A1c Recent Labs    04/10/23 0645  HGBA1C 6.0*   Lipid Profile No results for input(s): "CHOL", "HDL", "LDLCALC", "TRIG", "CHOLHDL", "LDLDIRECT" in the last 72 hours. Thyroid function studies No results for input(s): "TSH", "T4TOTAL", "T3FREE", "THYROIDAB" in the last 72 hours.  Invalid input(s): "FREET3" Anemia work up No results for input(s): "VITAMINB12", "FOLATE", "FERRITIN", "TIBC", "IRON", "RETICCTPCT" in the last 72 hours. Urinalysis    Component Value Date/Time   COLORURINE YELLOW 04/10/2023 0752   APPEARANCEUR CLEAR 04/10/2023 0752   LABSPEC 1.016 04/10/2023 0752   PHURINE 5.0 04/10/2023 0752   GLUCOSEU NEGATIVE 04/10/2023 0752   HGBUR NEGATIVE 04/10/2023 0752   BILIRUBINUR NEGATIVE 04/10/2023 0752   KETONESUR NEGATIVE 04/10/2023 0752   PROTEINUR 100 (A) 04/10/2023 0752   UROBILINOGEN 0.2 11/21/2008 1820   NITRITE NEGATIVE 04/10/2023 0752   LEUKOCYTESUR NEGATIVE 04/10/2023 0752   Sepsis Labs Recent Labs  Lab 04/05/23 1235 04/10/23 0645 04/11/23 0607  WBC 4.6 5.9 4.3   Microbiology Recent Results (from the past 240 hour(s))  Urine Culture     Status: Abnormal   Collection Time: 04/05/23  2:28 PM    Specimen: Urine, Clean Catch  Result Value Ref Range Status   Specimen Description   Final    URINE, CLEAN CATCH Performed at [facility], [address] [place], [place] [address]    Special Requests   Final    NONE Performed at [facility], [address] [place], [place] [address]    Culture >=100,000 COLONIES/mL KLEBSIELLA PNEUMONIAE (A)  Final   Report Status 04/07/2023 FINAL  Final   Organism ID, Bacteria KLEBSIELLA PNEUMONIAE (A)  Final      Susceptibility   Klebsiella pneumoniae - MIC*    AMPICILLIN >=32 RESISTANT Resistant     CEFAZOLIN <=4 SENSITIVE Sensitive     CEFEPIME <=0.12 SENSITIVE Sensitive     CEFTRIAXONE <=0.25 SENSITIVE Sensitive     CIPROFLOXACIN 0.5 INTERMEDIATE Intermediate     GENTAMICIN <=1 SENSITIVE Sensitive     IMIPENEM <=0.25 SENSITIVE Sensitive     NITROFURANTOIN >=512 RESISTANT Resistant     TRIMETH/SULFA 80 RESISTANT Resistant     AMPICILLIN/SULBACTAM 4 SENSITIVE Sensitive     PIP/TAZO 8 SENSITIVE Sensitive     * >=100,000 COLONIES/mL KLEBSIELLA PNEUMONIAE     Time coordinating discharge: Over 30 minutes  SIGNED:   Alvira Philips [place], DO  [facility] 04/12/2023, 11:56 AM

## 2023-04-12 NOTE — Care Management Important Message (Signed)
Important Message  Patient Details  Name: [name] MRN: [id number] Date of Birth: [birth year]   Medicare Important Message Given:  Yes  Patient left prior to Im delivery will mail a copy to the patient home address.    Nicklous Aburto 04/12/2023, 12:46 PM

## 2023-04-12 NOTE — Progress Notes (Signed)
While adjusting electrodes and raising pt in bed, a 50mg  Hydralazine pill was found on pt bedside table.

## 2023-04-12 NOTE — Care Management Important Message (Signed)
Important Message  Patient Details  Name: [name] MRN: [id number] Date of Birth: [birth year]   Medicare Important Message Given:  Yes  Patient left prior to IM delivery will mail copy to the patient home address.    Tan Clopper 04/12/2023, 3:50 PM

## 2023-06-21 DIAGNOSIS — N184 Chronic kidney disease, stage 4 (severe): Secondary | ICD-10-CM | POA: Diagnosis not present

## 2023-06-21 DIAGNOSIS — F0390 Unspecified dementia without behavioral disturbance: Secondary | ICD-10-CM | POA: Diagnosis not present

## 2023-06-21 DIAGNOSIS — I129 Hypertensive chronic kidney disease with stage 1 through stage 4 chronic kidney disease, or unspecified chronic kidney disease: Secondary | ICD-10-CM | POA: Diagnosis not present

## 2023-06-21 DIAGNOSIS — I5032 Chronic diastolic (congestive) heart failure: Secondary | ICD-10-CM | POA: Diagnosis not present

## 2023-06-21 DIAGNOSIS — D631 Anemia in chronic kidney disease: Secondary | ICD-10-CM | POA: Diagnosis not present

## 2023-11-05 DIAGNOSIS — I5032 Chronic diastolic (congestive) heart failure: Secondary | ICD-10-CM | POA: Diagnosis not present

## 2023-11-05 DIAGNOSIS — I129 Hypertensive chronic kidney disease with stage 1 through stage 4 chronic kidney disease, or unspecified chronic kidney disease: Secondary | ICD-10-CM | POA: Diagnosis not present

## 2023-11-05 DIAGNOSIS — D631 Anemia in chronic kidney disease: Secondary | ICD-10-CM | POA: Diagnosis not present

## 2023-11-05 DIAGNOSIS — F0390 Unspecified dementia without behavioral disturbance: Secondary | ICD-10-CM | POA: Diagnosis not present

## 2023-11-05 DIAGNOSIS — N184 Chronic kidney disease, stage 4 (severe): Secondary | ICD-10-CM | POA: Diagnosis not present

## 2023-12-12 DIAGNOSIS — E7800 Pure hypercholesterolemia, unspecified: Secondary | ICD-10-CM | POA: Diagnosis not present

## 2023-12-12 DIAGNOSIS — E1122 Type 2 diabetes mellitus with diabetic chronic kidney disease: Secondary | ICD-10-CM | POA: Diagnosis not present

## 2023-12-12 DIAGNOSIS — Z66 Do not resuscitate: Secondary | ICD-10-CM | POA: Diagnosis not present

## 2023-12-12 DIAGNOSIS — Z853 Personal history of malignant neoplasm of breast: Secondary | ICD-10-CM | POA: Diagnosis not present

## 2023-12-12 DIAGNOSIS — G40909 Epilepsy, unspecified, not intractable, without status epilepticus: Secondary | ICD-10-CM | POA: Diagnosis not present

## 2024-02-03 DIAGNOSIS — N1832 Chronic kidney disease, stage 3b: Secondary | ICD-10-CM | POA: Diagnosis not present

## 2024-02-03 DIAGNOSIS — I5032 Chronic diastolic (congestive) heart failure: Secondary | ICD-10-CM | POA: Diagnosis not present

## 2024-02-03 DIAGNOSIS — I10 Essential (primary) hypertension: Secondary | ICD-10-CM | POA: Diagnosis not present

## 2024-02-20 DIAGNOSIS — I129 Hypertensive chronic kidney disease with stage 1 through stage 4 chronic kidney disease, or unspecified chronic kidney disease: Secondary | ICD-10-CM | POA: Diagnosis not present

## 2024-02-20 DIAGNOSIS — I5032 Chronic diastolic (congestive) heart failure: Secondary | ICD-10-CM | POA: Diagnosis not present

## 2024-02-20 DIAGNOSIS — N184 Chronic kidney disease, stage 4 (severe): Secondary | ICD-10-CM | POA: Diagnosis not present

## 2024-02-20 DIAGNOSIS — D631 Anemia in chronic kidney disease: Secondary | ICD-10-CM | POA: Diagnosis not present

## 2024-03-16 DIAGNOSIS — Z66 Do not resuscitate: Secondary | ICD-10-CM | POA: Diagnosis not present

## 2024-03-16 DIAGNOSIS — Z853 Personal history of malignant neoplasm of breast: Secondary | ICD-10-CM | POA: Diagnosis not present

## 2024-03-16 DIAGNOSIS — N184 Chronic kidney disease, stage 4 (severe): Secondary | ICD-10-CM | POA: Diagnosis not present

## 2024-03-16 DIAGNOSIS — R350 Frequency of micturition: Secondary | ICD-10-CM | POA: Diagnosis not present

## 2024-03-16 DIAGNOSIS — E1122 Type 2 diabetes mellitus with diabetic chronic kidney disease: Secondary | ICD-10-CM | POA: Diagnosis not present

## 2024-03-16 DIAGNOSIS — G40909 Epilepsy, unspecified, not intractable, without status epilepticus: Secondary | ICD-10-CM | POA: Diagnosis not present

## 2024-03-16 DIAGNOSIS — E7800 Pure hypercholesterolemia, unspecified: Secondary | ICD-10-CM | POA: Diagnosis not present

## 2024-05-13 DIAGNOSIS — D631 Anemia in chronic kidney disease: Secondary | ICD-10-CM | POA: Diagnosis not present

## 2024-05-13 DIAGNOSIS — N2581 Secondary hyperparathyroidism of renal origin: Secondary | ICD-10-CM | POA: Diagnosis not present

## 2024-05-13 DIAGNOSIS — N184 Chronic kidney disease, stage 4 (severe): Secondary | ICD-10-CM | POA: Diagnosis not present

## 2024-05-13 DIAGNOSIS — R410 Disorientation, unspecified: Secondary | ICD-10-CM | POA: Diagnosis not present

## 2024-05-13 DIAGNOSIS — I129 Hypertensive chronic kidney disease with stage 1 through stage 4 chronic kidney disease, or unspecified chronic kidney disease: Secondary | ICD-10-CM | POA: Diagnosis not present

## 2024-06-16 DIAGNOSIS — R0600 Dyspnea, unspecified: Secondary | ICD-10-CM | POA: Diagnosis not present

## 2024-06-16 DIAGNOSIS — Z23 Encounter for immunization: Secondary | ICD-10-CM | POA: Diagnosis not present

## 2024-06-16 DIAGNOSIS — F03911 Unspecified dementia, unspecified severity, with agitation: Secondary | ICD-10-CM | POA: Diagnosis not present

## 2024-06-16 DIAGNOSIS — I270 Primary pulmonary hypertension: Secondary | ICD-10-CM | POA: Diagnosis not present

## 2024-06-16 DIAGNOSIS — E1122 Type 2 diabetes mellitus with diabetic chronic kidney disease: Secondary | ICD-10-CM | POA: Diagnosis not present

## 2024-06-16 DIAGNOSIS — N184 Chronic kidney disease, stage 4 (severe): Secondary | ICD-10-CM | POA: Diagnosis not present

## 2024-06-23 DIAGNOSIS — F3342 Major depressive disorder, recurrent, in full remission: Secondary | ICD-10-CM | POA: Diagnosis not present

## 2024-07-08 DIAGNOSIS — N184 Chronic kidney disease, stage 4 (severe): Secondary | ICD-10-CM | POA: Diagnosis not present

## 2024-07-08 DIAGNOSIS — N2581 Secondary hyperparathyroidism of renal origin: Secondary | ICD-10-CM | POA: Diagnosis not present

## 2024-07-08 DIAGNOSIS — D631 Anemia in chronic kidney disease: Secondary | ICD-10-CM | POA: Diagnosis not present

## 2024-07-08 DIAGNOSIS — I129 Hypertensive chronic kidney disease with stage 1 through stage 4 chronic kidney disease, or unspecified chronic kidney disease: Secondary | ICD-10-CM | POA: Diagnosis not present

## 2024-09-04 ENCOUNTER — Inpatient Hospital Stay (HOSPITAL_COMMUNITY): Admission: EM | Admit: 2024-09-04 | Source: Home / Self Care | Admitting: Student

## 2024-09-04 ENCOUNTER — Emergency Department (HOSPITAL_COMMUNITY)

## 2024-09-04 DIAGNOSIS — R531 Weakness: Principal | ICD-10-CM

## 2024-09-04 DIAGNOSIS — N179 Acute kidney failure, unspecified: Secondary | ICD-10-CM

## 2024-09-04 DIAGNOSIS — I5033 Acute on chronic diastolic (congestive) heart failure: Secondary | ICD-10-CM | POA: Diagnosis present

## 2024-09-04 LAB — PRO BRAIN NATRIURETIC PEPTIDE: Pro Brain Natriuretic Peptide: 20609 pg/mL — ABNORMAL HIGH

## 2024-09-04 LAB — CBC WITH DIFFERENTIAL/PLATELET
Abs Immature Granulocytes: 0.02 10*3/uL (ref 0.00–0.07)
Basophils Absolute: 0 10*3/uL (ref 0.0–0.1)
Basophils Relative: 1 %
Eosinophils Absolute: 0.1 10*3/uL (ref 0.0–0.5)
Eosinophils Relative: 3 %
HCT: 32.4 % — ABNORMAL LOW (ref 36.0–46.0)
Hemoglobin: 10.4 g/dL — ABNORMAL LOW (ref 12.0–15.0)
Immature Granulocytes: 1 %
Lymphocytes Relative: 14 %
Lymphs Abs: 0.6 10*3/uL — ABNORMAL LOW (ref 0.7–4.0)
MCH: 30.7 pg (ref 26.0–34.0)
MCHC: 32.1 g/dL (ref 30.0–36.0)
MCV: 95.6 fL (ref 80.0–100.0)
Monocytes Absolute: 0.5 10*3/uL (ref 0.1–1.0)
Monocytes Relative: 11 %
Neutro Abs: 3.1 10*3/uL (ref 1.7–7.7)
Neutrophils Relative %: 70 %
Platelets: 196 10*3/uL (ref 150–400)
RBC: 3.39 MIL/uL — ABNORMAL LOW (ref 3.87–5.11)
RDW: 16.1 % — ABNORMAL HIGH (ref 11.5–15.5)
WBC: 4.3 10*3/uL (ref 4.0–10.5)
nRBC: 0 % (ref 0.0–0.2)

## 2024-09-04 LAB — COMPREHENSIVE METABOLIC PANEL WITH GFR
ALT: 22 U/L (ref 0–44)
AST: 30 U/L (ref 15–41)
Albumin: 3.7 g/dL (ref 3.5–5.0)
Alkaline Phosphatase: 225 U/L — ABNORMAL HIGH (ref 38–126)
Anion gap: 12 (ref 5–15)
BUN: 48 mg/dL — ABNORMAL HIGH (ref 8–23)
CO2: 20 mmol/L — ABNORMAL LOW (ref 22–32)
Calcium: 9.2 mg/dL (ref 8.9–10.3)
Chloride: 105 mmol/L (ref 98–111)
Creatinine, Ser: 3.17 mg/dL — ABNORMAL HIGH (ref 0.44–1.00)
GFR, Estimated: 13 mL/min — ABNORMAL LOW
Glucose, Bld: 122 mg/dL — ABNORMAL HIGH (ref 70–99)
Potassium: 5.2 mmol/L — ABNORMAL HIGH (ref 3.5–5.1)
Sodium: 138 mmol/L (ref 135–145)
Total Bilirubin: 0.4 mg/dL (ref 0.0–1.2)
Total Protein: 7.2 g/dL (ref 6.5–8.1)

## 2024-09-04 LAB — TROPONIN T, HIGH SENSITIVITY
Troponin T High Sensitivity: 53 ng/L — ABNORMAL HIGH (ref 0–19)
Troponin T High Sensitivity: 51 ng/L — ABNORMAL HIGH (ref 0–19)

## 2024-09-04 LAB — MAGNESIUM: Magnesium: 2.2 mg/dL (ref 1.7–2.4)

## 2024-09-04 MED ORDER — ONDANSETRON HCL 4 MG/2ML IJ SOLN: 4.0000 mg | Freq: Four times a day (QID) | INTRAMUSCULAR | Status: DC | PRN

## 2024-09-04 MED ORDER — FUROSEMIDE 10 MG/ML IJ SOLN
80.0000 mg | Freq: Once | INTRAMUSCULAR | Status: AC
  Administered 2024-09-04: 80 mg via INTRAVENOUS
  Filled 2024-09-04: qty 8

## 2024-09-04 MED ORDER — MORPHINE SULFATE (PF) 4 MG/ML IV SOLN
4.0000 mg | Freq: Once | INTRAVENOUS | Status: AC
  Administered 2024-09-04: 4 mg via INTRAVENOUS
  Filled 2024-09-04: qty 1

## 2024-09-04 MED ORDER — ONDANSETRON HCL 4 MG PO TABS: 4.0000 mg | ORAL_TABLET | Freq: Four times a day (QID) | ORAL | Status: DC | PRN

## 2024-09-04 MED ORDER — ACETAMINOPHEN 325 MG PO TABS: 650.0000 mg | ORAL_TABLET | Freq: Four times a day (QID) | ORAL | Status: AC | PRN

## 2024-09-04 MED ORDER — FENTANYL CITRATE (PF) 50 MCG/ML IJ SOSY
50.0000 ug | PREFILLED_SYRINGE | Freq: Once | INTRAMUSCULAR | Status: AC
  Administered 2024-09-04: 50 ug via INTRAVENOUS
  Filled 2024-09-04: qty 1

## 2024-09-04 MED ORDER — BISACODYL 5 MG PO TBEC: 5.0000 mg | DELAYED_RELEASE_TABLET | Freq: Every day | ORAL | Status: AC | PRN

## 2024-09-04 MED ORDER — ACETAMINOPHEN 500 MG PO TABS
1000.0000 mg | ORAL_TABLET | Freq: Once | ORAL | Status: AC
  Administered 2024-09-04: 1000 mg via ORAL
  Filled 2024-09-04: qty 2

## 2024-09-04 MED ORDER — SENNOSIDES-DOCUSATE SODIUM 8.6-50 MG PO TABS: 1.0000 | ORAL_TABLET | Freq: Every evening | ORAL | Status: AC | PRN

## 2024-09-04 MED ORDER — ACETAMINOPHEN 650 MG RE SUPP: 650.0000 mg | Freq: Four times a day (QID) | RECTAL | Status: AC | PRN

## 2024-09-04 MED ORDER — INSULIN ASPART 100 UNIT/ML IJ SOLN: 0.0000 [IU] | Freq: Three times a day (TID) | INTRAMUSCULAR | Status: AC

## 2024-09-04 MED ORDER — HEPARIN SODIUM (PORCINE) 5000 UNIT/ML IJ SOLN
5000.0000 [IU] | Freq: Three times a day (TID) | INTRAMUSCULAR | Status: AC
  Administered 2024-09-04: 5000 [IU] via SUBCUTANEOUS
  Filled 2024-09-04: qty 1

## 2024-09-04 NOTE — ED Provider Notes (Cosign Needed)
 " [place] [facility] Provider Note   CSN: [id number] Arrival date & time: 09/04/24  1428     Patient presents with: Weakness   [name] is a 89 y.o. female with a PMHx notable for type 2 diabetes mellitus, essential hypertension, chronic diastolic congestive heart failure, CKD stage IV, seizure disorder, breast cancer s/p lumpectomy/radiation, tamoxifen who presents today for evaluation of worsening lower extremity swelling.  Patient reports that she has had worsening swelling for the past several months however within the past 1 to 2 weeks has had acute worsening.  Daughter at bedside reports that is now beginning to affect her ability to perform her daily activities specifically with walking.  Patient is currently living alone and given affected ambulation, daughters bring her in today for further evaluation.  Patient otherwise denies any chest pain, shortness of breath, orthopnea.  Has been having outpatient management of her diuretics and recently uptitrated from 40 to 80 mg without improvement.  Patient's only complaint at this point in time is right thigh pain but denies any recent falls.    Weakness      Prior to Admission medications  Medication Sig Start Date End Date Taking? Authorizing Provider  acetaminophen  (TYLENOL ) 500 MG tablet Take 2 tablets (1,000 mg total) by mouth every 8 (eight) hours as needed for mild pain or moderate pain. Patient taking differently: Take 1,000 mg by mouth 2 (two) times daily as needed for mild pain or moderate pain. 07/25/22   Ghimire, Donalda HERO, MD  allopurinol  (ZYLOPRIM ) 100 MG tablet Take 100 mg by mouth 2 (two) times daily.    [provider]  amLODipine  (NORVASC ) 10 MG tablet Take 10 mg by mouth daily.    [provider]  blood glucose meter kit and supplies KIT Dispense based on patient and insurance preference. Use up to four times daily as directed. (FOR ICD-9 250.00, 250.01). For QAC -  HS accuchecks. Patient taking differently: Inject 1 each into the skin See admin instructions. Dispense based on patient and insurance preference. Use up to four times daily as directed. (FOR ICD-9 250.00, 250.01). For QAC - HS accuchecks. 11/27/20   Singh, Prashant K, MD  Continuous Blood Gluc Sensor (FREESTYLE LIBRE 2 SENSOR) MISC USE AS DIRECTED. REAPPLY EVERY 14 DAYS Patient taking differently: 1 each by Other route daily. 11/20/21   Kassie Mallick, MD  dronabinol  (MARINOL ) 2.5 MG capsule Take 1 capsule (2.5 mg total) by mouth 2 (two) times daily before lunch and supper. Patient not taking: Reported on 04/10/2023 11/25/22   Fairy Frames, MD  furosemide  (LASIX ) 40 MG tablet Take 20 mg by mouth as needed for edema. 08/31/22   [provider]  hydrALAZINE  (APRESOLINE ) 50 MG tablet Take 50 mg by mouth 3 (three) times daily.    [provider]  isosorbide  mononitrate (IMDUR ) 30 MG 24 hr tablet Take 0.5 tablets (15 mg total) by mouth daily. Patient taking differently: Take 7.5 mg by mouth daily. 11/25/22   Fairy Frames, MD  levETIRAcetam  (KEPPRA ) 500 MG tablet Take 500 mg by mouth daily.    [provider]  metoprolol  tartrate (LOPRESSOR ) 50 MG tablet Take 50 mg by mouth 2 (two) times daily with a meal.    [provider]  mirtazapine  (REMERON ) 15 MG tablet Take 15 mg by mouth at bedtime.     [provider]  Multiple Vitamins-Minerals (CENTRUM SILVER 50+WOMEN) TABS Take 1 tablet by mouth daily with breakfast.  [provider]  omeprazole (PRILOSEC) 20 MG capsule Take 20 mg by mouth every Monday, Wednesday, and Friday.    [provider]  QUEtiapine  (SEROQUEL ) 25 MG tablet Take 25 mg by mouth daily. 08/31/22   [provider]    Allergies: Ace inhibitors, Prandin  [repaglinide ], Hydromorphone, Dilaudid [hydromorphone hcl], Oxycodone  hcl, Latex, Other, and Tape    Review of Systems  Neurological:  Positive for weakness.     Updated Vital Signs BP (!) 161/80   Pulse 68   Temp 98.5 F (36.9 C)   Resp 19   SpO2 97%   Physical Exam Constitutional:      General: She is not in acute distress. HENT:     Head: Normocephalic.     Right Ear: External ear normal.     Left Ear: External ear normal.     Nose: Nose normal.     Mouth/Throat:     Mouth: Mucous membranes are moist.  Eyes:     Pupils: Pupils are equal, round, and reactive to light.  Cardiovascular:     Rate and Rhythm: Normal rate and regular rhythm.     Pulses: Normal pulses.  Pulmonary:     Effort: Pulmonary effort is normal.     Breath sounds: Normal breath sounds.  Abdominal:     General: Abdomen is flat.     Palpations: Abdomen is soft.  Musculoskeletal:        General: Normal range of motion.     Cervical back: Normal range of motion.  Skin:    General: Skin is warm.     Capillary Refill: Capillary refill takes less than 2 seconds.  Neurological:     General: No focal deficit present.     Mental Status: She is alert and oriented to person, place, and time.  Psychiatric:        Mood and Affect: Mood normal.     (all labs ordered are listed, but only abnormal results are displayed) Labs Reviewed - No data to display  EKG: None  Radiology: No results found.  Procedures   Medications Ordered in the ED - No data to display                                Medical Decision Making Amount and/or Complexity of Data Reviewed Labs: ordered. Radiology: ordered.  Risk OTC drugs. Prescription drug management. Decision regarding hospitalization.   Patient is a 89 year old female who presents today for evaluation of worsening lower extremity swelling.  On initial assessment patient was noted to be hemodynamically stable and afebrile.  On my bedside assessment patient appeared to be uncomfortable specifically regarding pain around her right thigh.  Physical examination without any evidence of upper deformities and lower  extremity otherwise warm and well-perfused.  Patient does have notable bilateral lower extremity edema.  Heart and lungs otherwise clear to auscultation bilaterally.  Review of patient's laboratory evaluation was notable for creatinine of 3.17 with a prior baseline of low ones.  Patient also has elevated troponin to 51 as well as proBNP of 20,000.  Patient's x-rays did not show any evidence of acute abnormalities.  No evidence of acute traumatic injury.  Patient's overall clinical presentation concerning for heart failure exacerbation and volume retention.  This has been despite outpatient uptitration of her diuretic therapy.  I do think that she would require inpatient admission for further optimization.  Will provide her 80 mg  of Lasix  after further discussion with inpatient team.  It is unclear whether or not her creatinine is at baseline.  Her last available labs are from sometime ago and daughter reports that she has had interval labs that have reportedly shown worsening kidney function gradually over the past several months.   Final diagnoses:  Weakness  AKI (acute kidney injury)    ED Discharge Orders     None          Laurita Sieving, MD 09/04/24 2320  "

## 2024-09-04 NOTE — Progress Notes (Signed)
 [facility] [phone] [facility] Liaison Note:  This patient is currently enrolled in [facility] outpatient-based palliative care.  Daughter has requested hospice services upon discharge.  Please call for any outpatient based palliative or hospice care related questions or concerns.  Thank you, Greig Basket, BSN, RN [facility] Liaison [phone]

## 2024-09-04 NOTE — H&P (Incomplete)
 " History and Physical  [name] FMW:[id number] DOB: 08/06/1928 DOA: 09/04/2024  PCP: Rexanne Ingle, MD   Chief Complaint: Leg swelling  HPI: [name] is a 89 y.o. female with medical history significant for dementia, T2DM, GERD, chronic diastolic HF, HTN, CKD stage IV, seizure disorder, breast cancer s/p lumpectomy, radiation and tamoxifen, osteoarthritis, chronic pain, gout and emphysematous cystitis who presents to the ED for evaluation of lower extremity edema.  ED Course: Initial vitals show patient afebrile, HR 50-70s, SBP 140-160s, SpO2 97% on 3 L. Initial labs significant for K+ 5.2, bicarb 20, BUN/creatinine 48/3.17, alk phos 225, WBC 4.3, Hgb 10.4, proBNP 20,609, troponin 53. CXR shows cardiomegaly but no acute abnormality.. Pt received IV fentanyl , IV morphine , Tylenol  1 g and IV Lasix  80 mg x 1. [facility] was consulted for admission.   Review of Systems: Please see HPI for pertinent positives and negatives. A complete 10 system review of systems are otherwise negative.  Past Medical History:  Diagnosis Date   Dementia (HCC)    Diabetes mellitus    GERD (gastroesophageal reflux disease)    Glaucoma    Heart disease    Hypertension    Tinnitus    Past Surgical History:  Procedure Laterality Date   BREAST LUMPECTOMY     bil for breast ca   ORIF FEMUR FRACTURE Right 07/03/2022   Procedure: OPEN REDUCTION INTERNAL FIXATION (ORIF) DISTAL FEMUR FRACTURE;  Surgeon: Celena Sharper, MD;  Location: [facility] OR;  Service: Orthopedics;  Laterality: Right;   Social History:  reports that she has quit smoking. She has never used smokeless tobacco. She reports that she does not drink alcohol and does not use drugs.  Allergies[1]  Family History  Problem Relation Age of Onset   Diabetes Sister      Prior to Admission medications  Medication Sig Start Date End Date Taking? Authorizing Provider  acetaminophen  (TYLENOL ) 500 MG tablet Take 2 tablets (1,000 mg total) by mouth every 8 (eight)  hours as needed for mild pain or moderate pain. Patient taking differently: Take 1,000 mg by mouth 2 (two) times daily as needed for mild pain or moderate pain. 07/25/22   Ghimire, Donalda HERO, MD  allopurinol  (ZYLOPRIM ) 100 MG tablet Take 100 mg by mouth 2 (two) times daily.    [provider]  amLODipine  (NORVASC ) 10 MG tablet Take 10 mg by mouth daily.    [provider]  blood glucose meter kit and supplies KIT Dispense based on patient and insurance preference. Use up to four times daily as directed. (FOR ICD-9 250.00, 250.01). For QAC - HS accuchecks. Patient taking differently: Inject 1 each into the skin See admin instructions. Dispense based on patient and insurance preference. Use up to four times daily as directed. (FOR ICD-9 250.00, 250.01). For QAC - HS accuchecks. 11/27/20   Singh, Prashant K, MD  Continuous Blood Gluc Sensor (FREESTYLE LIBRE 2 SENSOR) MISC USE AS DIRECTED. REAPPLY EVERY 14 DAYS Patient taking differently: 1 each by Other route daily. 11/20/21   Kassie Mallick, MD  dronabinol  (MARINOL ) 2.5 MG capsule Take 1 capsule (2.5 mg total) by mouth 2 (two) times daily before lunch and supper. Patient not taking: Reported on 04/10/2023 11/25/22   Fairy Frames, MD  furosemide  (LASIX ) 40 MG tablet Take 20 mg by mouth as needed for edema. 08/31/22   [provider]  hydrALAZINE  (APRESOLINE ) 50 MG tablet Take 50 mg by mouth 3 (three) times daily.    [provider]  isosorbide  mononitrate (IMDUR ) 30 MG 24 hr tablet Take 0.5 tablets (15 mg total) by mouth daily. Patient taking differently: Take 7.5 mg by mouth daily. 11/25/22   Fairy Frames, MD  levETIRAcetam  (KEPPRA ) 500 MG tablet Take 500 mg by mouth daily.    [provider]  metoprolol  tartrate (LOPRESSOR ) 50 MG tablet Take 50 mg by mouth 2 (two) times daily with a meal.    [provider]  mirtazapine  (REMERON ) 15 MG tablet Take 15 mg by mouth at bedtime.     [provider]  Multiple Vitamins-Minerals (CENTRUM SILVER 50+WOMEN) TABS Take 1 tablet by mouth daily with breakfast.    [provider]  omeprazole (PRILOSEC) 20 MG capsule Take 20 mg by mouth every Monday, Wednesday, and Friday.    [provider]  QUEtiapine  (SEROQUEL ) 25 MG tablet Take 25 mg by mouth daily. 08/31/22   [provider]    Physical Exam: BP (!) 168/51 (BP Location: Left Arm)   Pulse (!) 59   Temp 98 F (36.7 C) (Oral)   Resp 20   SpO2 100%  General: Pleasant, well-appearing *** laying in bed. No acute distress. HEENT: [place]/AT. Anicteric sclera CV: RRR. No murmurs, rubs, or gallops. No LE edema Pulmonary: Lungs CTAB. Normal effort. No wheezing or rales. Abdominal: Soft, nontender, nondistended. Normal bowel sounds. Extremities: Palpable radial and DP pulses. Normal ROM. Skin: Warm and dry. No obvious rash or lesions. Neuro: A&Ox3. Moves all extremities. Normal sensation to light touch. No focal deficit. Psych: Normal mood and affect          Labs on Admission:  Basic Metabolic Panel: Recent Labs  Lab 09/04/24 1553  NA 138  K 5.2*  CL 105  CO2 20*  GLUCOSE 122*  BUN 48*  CREATININE 3.17*  CALCIUM 9.2  MG 2.2   Liver Function Tests: Recent Labs  Lab 09/04/24 1553  AST 30  ALT 22  ALKPHOS 225*  BILITOT 0.4  PROT 7.2  ALBUMIN  3.7   No results for input(s): LIPASE, AMYLASE in the last 168 hours. No results for input(s): AMMONIA in the last 168 hours. CBC: Recent Labs  Lab 09/04/24 1553  WBC 4.3  NEUTROABS 3.1  HGB 10.4*  HCT 32.4*  MCV 95.6  PLT 196   Cardiac Enzymes: No results for input(s): CKTOTAL, CKMB, CKMBINDEX, TROPONINI in the last 168 hours. BNP (last 3 results) No results for input(s): BNP in the last 8760 hours.  ProBNP (last 3 results) Recent Labs    09/04/24 1553  PROBNP 20,609.0*    CBG: No results for input(s): GLUCAP in the last 168 hours.  Radiological Exams on Admission: DG  Femur Min 2 Views Right Result Date: 09/04/2024 CLINICAL DATA:  Bilateral lower extremity edema, history of femur surgery EXAM: RIGHT FEMUR 2 VIEWS COMPARISON:  August 01, 2022 FINDINGS: There has been internal fixation of a right distal femur fracture with a plate and screws. Right knee prosthesis noted. No hip fracture identified. Mature callus is present around the distal femur fracture. The right knee prosthesis is in the expected position with no evidence of loosening. IMPRESSION: Prior right knee replacement and internal fixation of distal femur fracture. No acute abnormality. Electronically Signed   By: Nancyann Burns M.D.   On: 09/04/2024 16:40   DG Chest Portable 1 View Result Date: 09/04/2024 CLINICAL DATA:  Bilateral lower extremity edema EXAM: PORTABLE CHEST 1 VIEW COMPARISON:  April 05, 2023 FINDINGS: There is cardiomegaly. No acute pulmonary infiltrate.  No pleural effusion. Severe bilateral glenohumeral arthritis. IMPRESSION: Cardiomegaly.  No acute abnormality. Electronically Signed   By: Nancyann Burns M.D.   On: 09/04/2024 16:39   DG Pelvis Portable Result Date: 09/04/2024 CLINICAL DATA:  Bilateral lower extremity edema, end-stage renal disease, right femur surgery EXAM: PORTABLE PELVIS 1-2 VIEWS COMPARISON:  None Available. FINDINGS: Arterial vascular calcifications noted.  No hip or pelvic fracture. IMPRESSION: Vascular calcifications.  No hip or pelvic fracture Electronically Signed   By: Nancyann Burns M.D.   On: 09/04/2024 16:38    EKG: My independent interpretation is: Sinus rhythm with RBBB and prolonged QTc of 517  Assessment/Plan [name] is a 89 y.o. female with medical history significant for dementia, T2DM, GERD, chronic diastolic HF, pulmonary hypertension, HTN, CKD stage IV, seizure disorder, breast cancer s/p lumpectomy, radiation and tamoxifen, osteoarthritis, chronic pain, gout and emphysematous cystitis who presents to the ED for evaluation of lower extremity edema and  admitted for acute on chronic diastolic HF.  # Acute on chronic diastolic HF # Pulmonary hypertension - Last TTE on 11/16/22 shows EF 60-65%, mild LVH, G1DD, severely elevated PASP and moderate TVR. - Pt presented with progressive lower extremity edema after failing outpatient Lasix  titration - Start IV lasix  80 mg daily - Continue *** - Follow up repeat echocardiogram - Strict I&O, daily weights - Maintain K+ > 4.0, Mag > 2.0 - Telemetry  # AKI on CKD stage IV - Creatinine elevated to 3.17, from baseline of *** - Likely secondary to *** - Trend renal function and avoid nephrotoxic meds   # HTN - BP elevated with SBP in the 140s to 160 -   # T2DM - Last A1c 6.0% 1 year ago - SSI with meals, CBG monitor  # Seizure disorder   # Dementia  DVT prophylaxis: Heparin     Code Status: Limited: Do not attempt resuscitation (DNR) -DNR-LIMITED -Do Not Intubate/DNI   Consults called: None  Family Communication: ***  Severity of Illness: The appropriate patient status for this patient is INPATIENT. Inpatient status is judged to be reasonable and necessary in order to provide the required intensity of service to ensure the patient's safety. The patient's presenting symptoms, physical exam findings, and initial radiographic and laboratory data in the context of their chronic comorbidities is felt to place them at high risk for further clinical deterioration. Furthermore, it is not anticipated that the patient will be medically stable for discharge from the hospital within 2 midnights of admission.   * I certify that at the point of admission it is my clinical judgment that the patient will require inpatient hospital care spanning beyond 2 midnights from the point of admission due to high intensity of service, high risk for further deterioration and high frequency of surveillance required.*  Level of care: Telemetry    Lou Claretta HERO, MD 09/04/2024, 8:51 PM [facility] Pager: [phone] Isaiah 41:10   If 7PM-7AM, please contact night-coverage [email or web address] Password [email or web address]     [1]  Allergies Allergen Reactions   Ace Inhibitors Swelling, Rash and Cough   Prandin  [Repaglinide ] Other (See Comments)    Caused significant peripheral edema   Hydromorphone Itching, Other (See Comments) and Rash    Other Reaction: pruritis, Hallucinations (auditory and visual)   Dilaudid [Hydromorphone Hcl] Itching and Other (See Comments)    Hallucinations (auditory and visual) also   Oxycodone  Hcl Itching   Latex Rash   Other Nausea And Vomiting and Rash    Tussionex Pennkinetic  ER  Prefers PAPER TAPE, PLEASE!!   Tape Rash    Prefers PAPER TAPE, PLEASE!!   "

## 2024-09-04 NOTE — ED Triage Notes (Signed)
 Pt here  from home with c/o bil pedal edema and possible consult to hospice , otc O2 4 liters, known renal  problems
